# Patient Record
Sex: Female | Born: 1960 | Race: Black or African American | Hispanic: No | Marital: Single | Smoking: Never smoker
Health system: Southern US, Community
[De-identification: ages and names within clinical notes are randomized; demographics above are authoritative.]

## PROBLEM LIST (undated history)

## (undated) DIAGNOSIS — G4730 Sleep apnea, unspecified: Secondary | ICD-10-CM

## (undated) DIAGNOSIS — F319 Bipolar disorder, unspecified: Secondary | ICD-10-CM

## (undated) DIAGNOSIS — G43909 Migraine, unspecified, not intractable, without status migrainosus: Secondary | ICD-10-CM

## (undated) DIAGNOSIS — J449 Chronic obstructive pulmonary disease, unspecified: Secondary | ICD-10-CM

## (undated) DIAGNOSIS — J45909 Unspecified asthma, uncomplicated: Secondary | ICD-10-CM

## (undated) DIAGNOSIS — I517 Cardiomegaly: Secondary | ICD-10-CM

## (undated) DIAGNOSIS — K219 Gastro-esophageal reflux disease without esophagitis: Secondary | ICD-10-CM

## (undated) DIAGNOSIS — I639 Cerebral infarction, unspecified: Secondary | ICD-10-CM

## (undated) HISTORY — PX: ABDOMINAL SURGERY: SHX537

## (undated) HISTORY — PX: ABDOMINAL HYSTERECTOMY: SHX81

---

## 1998-01-09 HISTORY — PX: OTHER SURGICAL HISTORY: SHX169

## 2013-05-14 ENCOUNTER — Emergency Department: Payer: Self-pay | Admitting: Emergency Medicine

## 2013-05-14 LAB — CBC
HCT: 36.3 % (ref 35.0–47.0)
HGB: 12.1 g/dL (ref 12.0–16.0)
MCHC: 33.3 g/dL (ref 32.0–36.0)
MCV: 89 fL (ref 80–100)
Platelet: 200 10*3/uL (ref 150–440)
RBC: 4.09 10*6/uL (ref 3.80–5.20)
WBC: 5.3 10*3/uL (ref 3.6–11.0)

## 2013-05-14 LAB — MAGNESIUM: Magnesium: 1.6 mg/dL — ABNORMAL LOW

## 2013-05-14 LAB — BASIC METABOLIC PANEL
Calcium, Total: 8.5 mg/dL (ref 8.5–10.1)
Chloride: 108 mmol/L — ABNORMAL HIGH (ref 98–107)
Co2: 28 mmol/L (ref 21–32)
Creatinine: 0.67 mg/dL (ref 0.60–1.30)
EGFR (African American): >60
Glucose: 99 mg/dL (ref 65–99)
Potassium: 4.5 mmol/L (ref 3.5–5.1)

## 2013-05-14 LAB — URINALYSIS, COMPLETE
Bilirubin,UR: NEGATIVE
Glucose,UR: NEGATIVE mg/dL (ref 0–75)
Ketone: NEGATIVE
Nitrite: NEGATIVE
RBC,UR: 1 /HPF (ref 0–5)
Squamous Epithelial: 1
WBC UR: 1 /HPF (ref 0–5)

## 2020-09-18 ENCOUNTER — Emergency Department: Payer: Medicaid Other

## 2020-09-18 ENCOUNTER — Emergency Department
Admission: EM | Admit: 2020-09-18 | Discharge: 2020-09-19 | Disposition: A | Discharge status: Home / Self Care | Payer: Medicaid Other | Attending: Emergency Medicine | Admitting: Emergency Medicine

## 2020-09-18 ENCOUNTER — Other Ambulatory Visit: Payer: Self-pay

## 2020-09-18 DIAGNOSIS — J449 Chronic obstructive pulmonary disease, unspecified: Secondary | ICD-10-CM | POA: Diagnosis not present

## 2020-09-18 DIAGNOSIS — Z5321 Procedure and treatment not carried out due to patient leaving prior to being seen by health care provider: Secondary | ICD-10-CM | POA: Insufficient documentation

## 2020-09-18 DIAGNOSIS — J45909 Unspecified asthma, uncomplicated: Secondary | ICD-10-CM | POA: Insufficient documentation

## 2020-09-18 DIAGNOSIS — K219 Gastro-esophageal reflux disease without esophagitis: Secondary | ICD-10-CM | POA: Insufficient documentation

## 2020-09-18 DIAGNOSIS — R1110 Vomiting, unspecified: Secondary | ICD-10-CM | POA: Diagnosis present

## 2020-09-18 LAB — CBC
HCT: 41.3 % (ref 36.0–46.0)
Hemoglobin: 13 g/dL (ref 12.0–15.0)
MCH: 29.6 pg (ref 26.0–34.0)
MCHC: 31.5 g/dL (ref 30.0–36.0)
MCV: 94.1 fL (ref 80.0–100.0)
Platelets: 225 10*3/uL (ref 150–400)
RBC: 4.39 MIL/uL (ref 3.87–5.11)
RDW: 12.5 % (ref 11.5–15.5)
WBC: 6.3 10*3/uL (ref 4.0–10.5)
nRBC: 0 % (ref 0.0–0.2)

## 2020-09-18 LAB — COMPREHENSIVE METABOLIC PANEL
ALT: 7 U/L (ref 0–44)
AST: 14 U/L — ABNORMAL LOW (ref 15–41)
Albumin: 4.3 g/dL (ref 3.5–5.0)
Alkaline Phosphatase: 60 U/L (ref 38–126)
Anion gap: 10 (ref 5–15)
BUN: 11 mg/dL (ref 6–20)
CO2: 31 mmol/L (ref 22–32)
Calcium: 9.1 mg/dL (ref 8.9–10.3)
Chloride: 99 mmol/L (ref 98–111)
Creatinine, Ser: 1 mg/dL (ref 0.44–1.00)
GFR calc Af Amer: >60 mL/min (ref >60)
GFR calc non Af Amer: >60 mL/min (ref >60)
Glucose, Bld: 91 mg/dL (ref 70–99)
Potassium: 3.6 mmol/L (ref 3.5–5.1)
Sodium: 140 mmol/L (ref 135–145)
Total Bilirubin: 0.6 mg/dL (ref 0.3–1.2)
Total Protein: 8.7 g/dL — ABNORMAL HIGH (ref 6.5–8.1)

## 2020-09-18 LAB — LIPASE, BLOOD: Lipase: 21 U/L (ref 11–51)

## 2020-09-18 NOTE — ED Notes (Signed)
Patient with an episode of emesis. Assisted to the bathroom.

## 2020-09-18 NOTE — ED Triage Notes (Addendum)
Patient states that her reflux, COPD and asthmas have been bad for the past 3 days.  Patient reports she is vomiting acid.  Patient with no acute distress noted.

## 2020-09-20 ENCOUNTER — Other Ambulatory Visit: Payer: Self-pay

## 2020-09-20 ENCOUNTER — Encounter: Payer: Self-pay | Admitting: Emergency Medicine

## 2020-09-20 ENCOUNTER — Emergency Department
Admission: EM | Admit: 2020-09-20 | Discharge: 2020-09-20 | Disposition: A | Discharge status: Home / Self Care | Payer: Medicaid Other | Attending: Emergency Medicine | Admitting: Emergency Medicine

## 2020-09-20 DIAGNOSIS — R112 Nausea with vomiting, unspecified: Secondary | ICD-10-CM | POA: Diagnosis present

## 2020-09-20 DIAGNOSIS — Z20822 Contact with and (suspected) exposure to covid-19: Secondary | ICD-10-CM | POA: Diagnosis not present

## 2020-09-20 DIAGNOSIS — R05 Cough: Secondary | ICD-10-CM | POA: Insufficient documentation

## 2020-09-20 DIAGNOSIS — J449 Chronic obstructive pulmonary disease, unspecified: Secondary | ICD-10-CM | POA: Diagnosis not present

## 2020-09-20 DIAGNOSIS — R509 Fever, unspecified: Secondary | ICD-10-CM | POA: Diagnosis not present

## 2020-09-20 HISTORY — DX: Bipolar disorder, unspecified: F31.9

## 2020-09-20 HISTORY — DX: Chronic obstructive pulmonary disease, unspecified: J44.9

## 2020-09-20 HISTORY — DX: Unspecified asthma, uncomplicated: J45.909

## 2020-09-20 HISTORY — DX: Cardiomegaly: I51.7

## 2020-09-20 HISTORY — DX: Gastro-esophageal reflux disease without esophagitis: K21.9

## 2020-09-20 HISTORY — DX: Migraine, unspecified, not intractable, without status migrainosus: G43.909

## 2020-09-20 LAB — COMPREHENSIVE METABOLIC PANEL
ALT: 11 U/L (ref 0–44)
AST: 15 U/L (ref 15–41)
Albumin: 4.1 g/dL (ref 3.5–5.0)
Alkaline Phosphatase: 56 U/L (ref 38–126)
Anion gap: 9 (ref 5–15)
BUN: 9 mg/dL (ref 6–20)
CO2: 30 mmol/L (ref 22–32)
Calcium: 9 mg/dL (ref 8.9–10.3)
Chloride: 99 mmol/L (ref 98–111)
Creatinine, Ser: 1.01 mg/dL — ABNORMAL HIGH (ref 0.44–1.00)
GFR calc Af Amer: >60 mL/min (ref >60)
GFR calc non Af Amer: >60 mL/min (ref >60)
Glucose, Bld: 104 mg/dL — ABNORMAL HIGH (ref 70–99)
Potassium: 3.6 mmol/L (ref 3.5–5.1)
Sodium: 138 mmol/L (ref 135–145)
Total Bilirubin: 0.9 mg/dL (ref 0.3–1.2)
Total Protein: 8.1 g/dL (ref 6.5–8.1)

## 2020-09-20 LAB — CBC
HCT: 38.9 % (ref 36.0–46.0)
Hemoglobin: 12.1 g/dL (ref 12.0–15.0)
MCH: 28.9 pg (ref 26.0–34.0)
MCHC: 31.1 g/dL (ref 30.0–36.0)
MCV: 93.1 fL (ref 80.0–100.0)
Platelets: 217 10*3/uL (ref 150–400)
RBC: 4.18 MIL/uL (ref 3.87–5.11)
RDW: 12.6 % (ref 11.5–15.5)
WBC: 6.1 10*3/uL (ref 4.0–10.5)
nRBC: 0 % (ref 0.0–0.2)

## 2020-09-20 LAB — URINALYSIS, COMPLETE (UACMP) WITH MICROSCOPIC
Bilirubin Urine: NEGATIVE
Glucose, UA: NEGATIVE mg/dL
Ketones, ur: NEGATIVE mg/dL
Leukocytes,Ua: NEGATIVE
Nitrite: NEGATIVE
Protein, ur: NEGATIVE mg/dL
Specific Gravity, Urine: 1.006 (ref 1.005–1.030)
pH: 5 (ref 5.0–8.0)

## 2020-09-20 LAB — SARS CORONAVIRUS 2 BY RT PCR (HOSPITAL ORDER, PERFORMED IN ~~LOC~~ HOSPITAL LAB): SARS Coronavirus 2: NEGATIVE

## 2020-09-20 LAB — LIPASE, BLOOD: Lipase: 20 U/L (ref 11–51)

## 2020-09-20 MED ORDER — ONDANSETRON HCL 4 MG/2ML IJ SOLN
4.0000 mg | Freq: Once | INTRAMUSCULAR | Status: AC
  Administered 2020-09-20: 4 mg via INTRAVENOUS
  Filled 2020-09-20: qty 2

## 2020-09-20 MED ORDER — PROMETHAZINE HCL 25 MG/ML IJ SOLN
25.0000 mg | Freq: Once | INTRAMUSCULAR | Status: AC
  Administered 2020-09-20: 25 mg via INTRAVENOUS
  Filled 2020-09-20: qty 1

## 2020-09-20 MED ORDER — ONDANSETRON HCL 4 MG/2ML IJ SOLN: 4.0000 mg | Freq: Once | INTRAMUSCULAR | Status: DC

## 2020-09-20 MED ORDER — PROMETHAZINE HCL 25 MG PO TABS: 25.0000 mg | ORAL_TABLET | Freq: Four times a day (QID) | ORAL | 0 refills | Status: DC | PRN

## 2020-09-20 MED ORDER — HYDROCOD POLST-CPM POLST ER 10-8 MG/5ML PO SUER
5.0000 mL | Freq: Once | ORAL | Status: AC
  Administered 2020-09-20: 5 mL via ORAL
  Filled 2020-09-20: qty 5

## 2020-09-20 MED ORDER — LIDOCAINE VISCOUS HCL 2 % MT SOLN
15.0000 mL | Freq: Once | OROMUCOSAL | Status: AC
  Administered 2020-09-20: 15 mL via ORAL
  Filled 2020-09-20: qty 15

## 2020-09-20 MED ORDER — METOCLOPRAMIDE HCL 5 MG/ML IJ SOLN
10.0000 mg | Freq: Once | INTRAMUSCULAR | Status: AC
  Administered 2020-09-20: 10 mg via INTRAVENOUS
  Filled 2020-09-20: qty 2

## 2020-09-20 MED ORDER — KETOROLAC TROMETHAMINE 30 MG/ML IJ SOLN
30.0000 mg | Freq: Once | INTRAMUSCULAR | Status: AC
  Administered 2020-09-20: 30 mg via INTRAVENOUS
  Filled 2020-09-20: qty 1

## 2020-09-20 MED ORDER — ALUM & MAG HYDROXIDE-SIMETH 200-200-20 MG/5ML PO SUSP
30.0000 mL | Freq: Once | ORAL | Status: AC
  Administered 2020-09-20: 30 mL via ORAL
  Filled 2020-09-20: qty 30

## 2020-09-20 MED ORDER — SODIUM CHLORIDE 0.9 % IV BOLUS
1000.0000 mL | Freq: Once | INTRAVENOUS | Status: AC
  Administered 2020-09-20: 1000 mL via INTRAVENOUS

## 2020-09-20 MED ORDER — GI COCKTAIL ~~LOC~~: 30.0000 mL | Freq: Two times a day (BID) | ORAL | 0 refills | Status: DC | PRN

## 2020-09-20 NOTE — ED Triage Notes (Signed)
Patient to ER for c/o vomiting. Patient states she has asthma, COPD, and GERD and "they're all ganging up on to where I can't eat without throwing up".

## 2020-09-20 NOTE — ED Notes (Signed)
See triage note  Presents with some n/v  No fever at present

## 2020-09-20 NOTE — ED Provider Notes (Signed)
[facility] Emergency Department Provider Note  Time seen: 3:52 PM  I have reviewed the triage vital signs and the nursing notes.   HISTORY  Chief Complaint Nausea vomiting fever   HPI Tiffany [name] is a 59 y.o. female with a past medical history of asthma, bipolar, COPD, gastric reflux, presents to the emergency department for 4 days of symptoms nausea vomiting occasional cough and low-grade fever.  According to the patient for the most part she has been experiencing low-grade fever however she states 2 days ago it was as high as 102.  Patient came to the emergency department but due to prolonged wait left before being seen.  Patient states he continues to feel some shortness of breath but mostly nausea and vomiting and occasional cough.  Patient states he has not been able to keep down any fluids for the past 24 hours.  Denies any diarrhea denies any abdominal pain.  Patient has been vaccinated against Covid her second vaccine was 05/19/2020.   Past Medical History:  Diagnosis Date  . Asthma   . Bipolar 1 disorder (HCC)   . COPD (chronic obstructive pulmonary disease) (HCC)   . Enlarged heart   . GERD (gastroesophageal reflux disease)   . Migraines     There are no problems to display for this patient.   Past Surgical History:  Procedure Laterality Date  . ABDOMINAL HYSTERECTOMY    . ABDOMINAL SURGERY     "For acid reflux"  . CESAREAN SECTION      Prior to Admission medications   Not on File    Allergies  Allergen Reactions  . Dairy Aid [Lactase] Other (See Comments)    Exacerbates asthma  . Egg [Eggs Or Egg-Derived Products] Other (See Comments)    Exacerbated asthma    No family history on file.  Social History Social History   Tobacco Use  . Smoking status: Never Smoker  . Smokeless tobacco: Never Used  Substance Use Topics  . Alcohol use: Never  . Drug use: Not on file    Review of Systems Constitutional: Intermittent  fever Cardiovascular: Negative for chest pain. Respiratory: Negative for shortness of breath. Gastrointestinal: Negative for abdominal pain.  Positive for nausea vomiting. Musculoskeletal: Negative for musculoskeletal complaints Neurological: Intermittent moderate headache. All other ROS negative  ____________________________________________   PHYSICAL EXAM:  VITAL SIGNS: ED Triage Vitals  Enc Vitals Group     BP 09/20/20 1357 (!) 147/90     Pulse Rate 09/20/20 1357 80     Resp 09/20/20 1357 20     Temp 09/20/20 1357 98.3 F (36.8 C)     Temp Source 09/20/20 1357 Oral     SpO2 09/20/20 1357 96 %     Weight 09/20/20 1358 (!) 335 lb 1.6 oz (152 kg)     Height 09/20/20 1358 5' 9.5" (1.765 m)     Head Circumference --      Peak Flow --      Pain Score 09/20/20 1358 10     Pain Loc --      Pain Edu? --      Excl. in GC? --    Constitutional: Alert and oriented. Well appearing and in no distress. Eyes: Normal exam ENT      Head: Normocephalic and atraumatic.      Mouth/Throat: Mucous membranes are moist. Cardiovascular: Normal rate, regular rhythm.  Respiratory: Normal respiratory effort without tachypnea nor retractions. Breath sounds are clear  Gastrointestinal: Soft and  nontender. No distention.   Musculoskeletal: Nontender with normal range of motion in all extremities. Neurologic:  Normal speech and language. No gross focal neurologic deficits Skin:  Skin is warm, dry and intact.  Psychiatric: Mood and affect are normal.  ____________________________________________   RADIOLOGY  Chest x-ray reviewed from 2 days ago shows no acute abnormality.  ____________________________________________   INITIAL IMPRESSION / ASSESSMENT AND PLAN / ED COURSE  Pertinent labs & imaging results that were available during my care of the patient were reviewed by me and considered in my medical decision making (see chart for details).   Patient presents to the emergency department  for 4 days of nausea vomiting cough fever as high as 102 at home.  Patient symptoms are very suggestive of viral process such as COVID-19 patient has been vaccinated by last vaccine was 05/19/2020, and with the high incidence of delta variant we will proceed with Covid testing.  We will place an IV, IV hydrate treat with Zofran and Toradol.  Patient agreeable to plan of care.  Covid test is negative.  Labs are largely reassuring.  Patient continued to have intermittent nausea which she thought was mostly due to reflux.  Patient received a GI cocktail and Phenergan and felt much better.  Has been able to eat crackers and drink fluids.  We will discharge with Phenergan and GI cocktails for the patient.  Discussed return precautions.  Tiffany [name] was evaluated in Emergency Department on 09/20/2020 for the symptoms described in the history of present illness. She was evaluated in the context of the global COVID-19 pandemic, which necessitated consideration that the patient might be at risk for infection with the SARS-CoV-2 virus that causes COVID-19. Institutional protocols and algorithms that pertain to the evaluation of patients at risk for COVID-19 are in a state of rapid change based on information released by regulatory bodies including the CDC and federal and state organizations. These policies and algorithms were followed during the patient's care in the ED.  ____________________________________________   FINAL CLINICAL IMPRESSION(S) / ED DIAGNOSES  Nausea vomiting   Minna Antis, MD 09/20/20 2001

## 2020-11-11 ENCOUNTER — Emergency Department: Payer: Medicaid Other

## 2020-11-11 ENCOUNTER — Emergency Department
Admission: EM | Admit: 2020-11-11 | Discharge: 2020-11-11 | Disposition: A | Discharge status: Home / Self Care | Payer: Medicaid Other | Attending: Emergency Medicine | Admitting: Emergency Medicine

## 2020-11-11 ENCOUNTER — Other Ambulatory Visit: Payer: Self-pay

## 2020-11-11 DIAGNOSIS — J45909 Unspecified asthma, uncomplicated: Secondary | ICD-10-CM | POA: Diagnosis not present

## 2020-11-11 DIAGNOSIS — J449 Chronic obstructive pulmonary disease, unspecified: Secondary | ICD-10-CM | POA: Diagnosis not present

## 2020-11-11 DIAGNOSIS — R0789 Other chest pain: Secondary | ICD-10-CM | POA: Diagnosis not present

## 2020-11-11 DIAGNOSIS — M542 Cervicalgia: Secondary | ICD-10-CM | POA: Insufficient documentation

## 2020-11-11 DIAGNOSIS — W19XXXA Unspecified fall, initial encounter: Secondary | ICD-10-CM

## 2020-11-11 DIAGNOSIS — M79602 Pain in left arm: Secondary | ICD-10-CM | POA: Insufficient documentation

## 2020-11-11 DIAGNOSIS — W010XXA Fall on same level from slipping, tripping and stumbling without subsequent striking against object, initial encounter: Secondary | ICD-10-CM | POA: Insufficient documentation

## 2020-11-11 MED ORDER — METHOCARBAMOL 500 MG PO TABS: 500.0000 mg | ORAL_TABLET | Freq: Three times a day (TID) | ORAL | 0 refills | Status: AC | PRN

## 2020-11-11 MED ORDER — HYDROCODONE-ACETAMINOPHEN 5-325 MG PO TABS
1.0000 | ORAL_TABLET | Freq: Once | ORAL | Status: AC
  Administered 2020-11-11: 1 via ORAL
  Filled 2020-11-11: qty 1

## 2020-11-11 MED ORDER — ONDANSETRON 4 MG PO TBDP
4.0000 mg | ORAL_TABLET | Freq: Once | ORAL | Status: AC
  Administered 2020-11-11: 4 mg via ORAL
  Filled 2020-11-11: qty 1

## 2020-11-11 NOTE — ED Notes (Signed)
Pt wheeled back to room. Alert. Calm. A&Ox4. Denies hitting head when she fell. Pt was able to stand turn and sit on stretcher. C/o neck, left arm, lower back, and bilateral hip pain. Pt reports able to move all extremities appropriately. Denies numbness/tingling anywhere. No bleeding noted. Pt reports significant other was with her when she fell. Reports fell while going over an uneven surface with her walker. Visitor at bedside.

## 2020-11-11 NOTE — ED Provider Notes (Signed)
Emergency Department Provider Note  ____________________________________________  Time seen: Approximately 4:41 PM  I have reviewed the triage vital signs and the nursing notes.   HISTORY  Chief Complaint Fall   Historian Patient     HPI Tiffany [name] is a 59 y.o. female presents to the emergency department after patient had a mechanical fall.  She states that she was attempting to stand and lost her balance.  She states that she sits most of the time and has not attempted to ambulate since injury occurred.  She denies hitting her head.  She is complaining of neck discomfort that radiates down her left arm and some left-sided anterior chest wall discomfort.  She denies weakness in the arms and legs.  No chest pain, chest tightness or abdominal pain.   Past Medical History:  Diagnosis Date   Asthma    Bipolar 1 disorder (HCC)    COPD (chronic obstructive pulmonary disease) (HCC)    Enlarged heart    GERD (gastroesophageal reflux disease)    Migraines      Immunizations up to date:  Yes.     Past Medical History:  Diagnosis Date   Asthma    Bipolar 1 disorder (HCC)    COPD (chronic obstructive pulmonary disease) (HCC)    Enlarged heart    GERD (gastroesophageal reflux disease)    Migraines     There are no problems to display for this patient.   Past Surgical History:  Procedure Laterality Date   ABDOMINAL HYSTERECTOMY     ABDOMINAL SURGERY     "For acid reflux"   CESAREAN SECTION      Prior to Admission medications   Medication Sig Start Date End Date Taking? Authorizing Provider  Alum & Mag Hydroxide-Simeth (GI COCKTAIL) SUSP suspension Take 30 mLs by mouth 2 (two) times daily as needed for indigestion (abd pain). Shake well. Each dose to containe 59mL maalox and 63mL viscous lidocaine 09/20/20   Minna Antis, MD  methocarbamol (ROBAXIN) 500 MG tablet Take 1 tablet (500 mg total) by mouth every 8 (eight) hours as needed for up to  5 days for muscle spasms. 11/11/20 11/16/20  Orvil Feil, PA-C  promethazine (PHENERGAN) 25 MG tablet Take 1 tablet (25 mg total) by mouth every 6 (six) hours as needed for nausea or vomiting. 09/20/20   Minna Antis, MD    Allergies Dairy aid [lactase] and Egg [eggs or egg-derived products]  History reviewed. No pertinent family history.  Social History Social History   Tobacco Use   Smoking status: Never Smoker   Smokeless tobacco: Never Used  Substance Use Topics   Alcohol use: Never   Drug use: Never     Review of Systems  Constitutional: No fever/chills Eyes:  No discharge ENT: No upper respiratory complaints. Respiratory: no cough. No SOB/ use of accessory muscles to breath Gastrointestinal:   No nausea, no vomiting.  No diarrhea.  No constipation. Musculoskeletal: Patient has left arm pain and neck pain.  Skin: Negative for rash, abrasions, lacerations, ecchymosis.    ____________________________________________   PHYSICAL EXAM:  VITAL SIGNS: ED Triage Vitals  Enc Vitals Group     BP 11/11/20 1516 (!) 151/88     Pulse Rate 11/11/20 1516 83     Resp 11/11/20 1516 18     Temp 11/11/20 1516 98.7 F (37.1 C)     Temp Source 11/11/20 1516 Oral     SpO2 11/11/20 1516 93 %     Weight 11/11/20  1517 (!) 350 lb (158.8 kg)     Height 11/11/20 1517 5' 9.5" (1.765 m)     Head Circumference --      Peak Flow --      Pain Score 11/11/20 1516 10     Pain Loc --      Pain Edu? --      Excl. in GC? --      Constitutional: Alert and oriented. Well appearing and in no acute distress. Eyes: Conjunctivae are normal. PERRL. EOMI. Head: Atraumatic. ENT:      Nose: No congestion/rhinnorhea.      Mouth/Throat: Mucous membranes are moist.  Neck: No stridor.  Full range of motion.  No midline C-spine tenderness to palpation. Cardiovascular: Normal rate, regular rhythm. Normal S1 and S2.  Good peripheral circulation. Respiratory: Normal respiratory effort  without tachypnea or retractions. Lungs CTAB. Good air entry to the bases with no decreased or absent breath sounds Gastrointestinal: Bowel sounds x 4 quadrants. Soft and nontender to palpation. No guarding or rigidity. No distention. Musculoskeletal: Full range of motion to all extremities. No obvious deformities noted Neurologic:  Normal for age. No gross focal neurologic deficits are appreciated.  Skin:  Skin is warm, dry and intact. No rash noted. Psychiatric: Mood and affect are normal for age. Speech and behavior are normal.   ____________________________________________   LABS (all labs ordered are listed, but only abnormal results are displayed)  Labs Reviewed - No data to display ____________________________________________  EKG   ____________________________________________  RADIOLOGY Geraldo Pitter, personally viewed and evaluated these images (plain radiographs) as part of my medical decision making, as well as reviewing the written report by the radiologist.    DG Chest 1 View  Result Date: 11/11/2020 CLINICAL DATA:  60 year old female status post fall on concrete at 1450 hours. EXAM: CHEST  1 VIEW COMPARISON:  Chest radiographs 09/18/2020. FINDINGS: Portable AP semi upright view at 1643 hours. Kyphotic positioning. Lung volumes and mediastinal contours appear stable. Allowing for portable technique the lungs are clear. No pneumothorax. No acute osseous abnormality identified. IMPRESSION: No acute cardiopulmonary abnormality or acute traumatic injury identified. Electronically Signed   By: Odessa Fleming M.D.   On: 11/11/2020 16:57   CT Cervical Spine Wo Contrast  Result Date: 11/11/2020 CLINICAL DATA:  Neck trauma, left neck pain, fall today. EXAM: CT CERVICAL SPINE WITHOUT CONTRAST TECHNIQUE: Multidetector CT imaging of the cervical spine was performed without intravenous contrast. Multiplanar CT image reconstructions were also generated. COMPARISON:  None. FINDINGS: Body  habitus reduces diagnostic sensitivity and specificity. In particular the signal to noise ratio from C6 through T2 is markedly reduced causing indistinctness of cortical margins in reducing sensitivity for bony abnormalities. Alignment: Loss of the normal cervical lordosis, which can be associated with muscle spasm. Skull base and vertebrae: No cervical spine fracture or acute subluxation is identified. Mild cervical spondylosis. Soft tissues and spinal canal: Unremarkable Disc levels:  No bony impingement identified. Upper chest: Unremarkable Other: Suspected chronic sphenoid sinusitis along with chronic right maxillary sinusitis. IMPRESSION: 1. No cervical spine fracture or acute subluxation is identified. 2. Loss of the normal cervical lordosis, which can be associated with muscle spasm. 3. Mild cervical spondylosis. 4. Chronic sphenoid and right maxillary sinusitis. 5.  Body habitus reduces diagnostic sensitivity and specificity. Electronically Signed   By: Gaylyn Rong M.D.   On: 11/11/2020 18:28    ____________________________________________    PROCEDURES  Procedure(s) performed:     Procedures  Medications  HYDROcodone-acetaminophen (NORCO/VICODIN) 5-325 MG per tablet 1 tablet (1 tablet Oral Given 11/11/20 1645)  ondansetron (ZOFRAN-ODT) disintegrating tablet 4 mg (4 mg Oral Given 11/11/20 1645)     ____________________________________________   INITIAL IMPRESSION / ASSESSMENT AND PLAN / ED COURSE  Pertinent labs & imaging results that were available during my care of the patient were reviewed by me and considered in my medical decision making (see chart for details).      Assessment and plan:  Fall 59 year old female presents to the emergency department with neck pain and left anterior chest pain after patient had a mechanical fall.  Patient was hypertensive at triage but vital signs were otherwise reassuring.  Anterior chest discomfort was reproducible to  palpation.  Differential diagnosis included pneumothorax, chest wall contusion, C-spine fracture...  CT of the cervical spine was reviewed and there was no evidence of acute bony abnormalities.  No evidence of pneumothorax or rib fracture on chest x-ray.  Patient was given one-time dose of Norco in the emergency department and discharged with a short course of Robaxin.  Return precautions were given to return with new or worsening symptoms.   ____________________________________________  FINAL CLINICAL IMPRESSION(S) / ED DIAGNOSES  Final diagnoses:  Fall, initial encounter      NEW MEDICATIONS STARTED DURING THIS VISIT:  ED Discharge Orders         Ordered    methocarbamol (ROBAXIN) 500 MG tablet  Every 8 hours PRN        11/11/20 1900              This chart was dictated using voice recognition software/Dragon. Despite best efforts to proofread, errors can occur which can change the meaning. Any change was purely unintentional.     Orvil Feil, PA-C 11/11/20 1910    Gilles Chiquito, MD 11/11/20 2330

## 2020-11-11 NOTE — ED Triage Notes (Signed)
Pt here via POV from home after mechanical fall on concrete around 2:50pm this afternoon. Pt states she tripped and fell onto her back, also c/o L arm pain and swelling in her fingers. Pt denies hitting head, denies LOC. Pt is not on blood thinners.

## 2020-11-11 NOTE — ED Notes (Signed)
First Nurse Note: Pt to ED for fall. Pt is in NAD.

## 2021-02-15 ENCOUNTER — Other Ambulatory Visit (HOSPITAL_COMMUNITY): Payer: Self-pay | Admitting: Radiology

## 2021-02-15 DIAGNOSIS — G4733 Obstructive sleep apnea (adult) (pediatric): Secondary | ICD-10-CM

## 2021-03-13 ENCOUNTER — Encounter (INDEPENDENT_AMBULATORY_CARE_PROVIDER_SITE_OTHER): Payer: Self-pay | Admitting: *Deleted

## 2021-03-17 ENCOUNTER — Other Ambulatory Visit (HOSPITAL_COMMUNITY)
Admission: RE | Admit: 2021-03-17 | Discharge: 2021-03-17 | Disposition: A | Discharge status: Home / Self Care | Payer: Medicaid Other | Source: Ambulatory Visit | Attending: Family Medicine | Admitting: Family Medicine

## 2021-03-17 ENCOUNTER — Other Ambulatory Visit (HOSPITAL_COMMUNITY): Payer: Medicaid Other

## 2021-03-20 ENCOUNTER — Telehealth (HOSPITAL_COMMUNITY): Payer: Self-pay | Admitting: *Deleted

## 2021-03-20 NOTE — Telephone Encounter (Signed)
Called patient and left a message with her voicemail. I asked patient to call me if she could come in today for her Covid screening. I will reschedule her an appointment. I also explained to her she would need to call her Dr.'s office and cancel her procedure. Waiting for patient's call.

## 2021-03-21 ENCOUNTER — Ambulatory Visit (HOSPITAL_COMMUNITY): Admission: RE | Admit: 2021-03-21 | Payer: Medicaid Other | Source: Ambulatory Visit

## 2021-03-21 ENCOUNTER — Ambulatory Visit: Payer: Medicaid Other | Admitting: Orthopedic Surgery

## 2021-04-04 ENCOUNTER — Other Ambulatory Visit (HOSPITAL_BASED_OUTPATIENT_CLINIC_OR_DEPARTMENT_OTHER): Payer: Self-pay

## 2021-04-04 DIAGNOSIS — G4733 Obstructive sleep apnea (adult) (pediatric): Secondary | ICD-10-CM

## 2021-04-21 ENCOUNTER — Other Ambulatory Visit (HOSPITAL_COMMUNITY): Payer: Medicaid Other

## 2021-06-08 ENCOUNTER — Encounter (INDEPENDENT_AMBULATORY_CARE_PROVIDER_SITE_OTHER): Payer: Self-pay | Admitting: *Deleted

## 2021-06-08 ENCOUNTER — Ambulatory Visit (INDEPENDENT_AMBULATORY_CARE_PROVIDER_SITE_OTHER): Payer: Medicaid Other | Admitting: Gastroenterology

## 2021-06-08 ENCOUNTER — Encounter (INDEPENDENT_AMBULATORY_CARE_PROVIDER_SITE_OTHER): Payer: Self-pay | Admitting: Gastroenterology

## 2021-06-08 ENCOUNTER — Telehealth (INDEPENDENT_AMBULATORY_CARE_PROVIDER_SITE_OTHER): Payer: Self-pay

## 2021-06-08 NOTE — Telephone Encounter (Signed)
Patient no showed for her appointment on 06/08/2021 with Dr. Levon Hedger at [facility].Referred by Dr. Shellia Carwin.

## 2021-07-10 ENCOUNTER — Emergency Department
Admission: EM | Admit: 2021-07-10 | Discharge: 2021-07-10 | Disposition: A | Discharge status: Home / Self Care | Payer: Medicaid Other | Attending: Emergency Medicine | Admitting: Emergency Medicine

## 2021-07-10 ENCOUNTER — Other Ambulatory Visit: Payer: Self-pay

## 2021-07-10 ENCOUNTER — Emergency Department: Payer: Medicaid Other

## 2021-07-10 DIAGNOSIS — R059 Cough, unspecified: Secondary | ICD-10-CM

## 2021-07-10 DIAGNOSIS — R112 Nausea with vomiting, unspecified: Secondary | ICD-10-CM | POA: Diagnosis not present

## 2021-07-10 DIAGNOSIS — J4521 Mild intermittent asthma with (acute) exacerbation: Secondary | ICD-10-CM | POA: Diagnosis not present

## 2021-07-10 DIAGNOSIS — J449 Chronic obstructive pulmonary disease, unspecified: Secondary | ICD-10-CM | POA: Diagnosis not present

## 2021-07-10 DIAGNOSIS — J4531 Mild persistent asthma with (acute) exacerbation: Secondary | ICD-10-CM

## 2021-07-10 DIAGNOSIS — J209 Acute bronchitis, unspecified: Secondary | ICD-10-CM | POA: Diagnosis not present

## 2021-07-10 LAB — URINALYSIS, COMPLETE (UACMP) WITH MICROSCOPIC
Bacteria, UA: NONE SEEN
Bilirubin Urine: NEGATIVE
Glucose, UA: NEGATIVE mg/dL
Ketones, ur: NEGATIVE mg/dL
Leukocytes,Ua: NEGATIVE
Nitrite: NEGATIVE
Protein, ur: 30 mg/dL — AB
Specific Gravity, Urine: 1.025 (ref 1.005–1.030)
pH: 5 (ref 5.0–8.0)

## 2021-07-10 LAB — LIPASE, BLOOD: Lipase: 22 U/L (ref 11–51)

## 2021-07-10 LAB — COMPREHENSIVE METABOLIC PANEL
ALT: 8 U/L (ref 0–44)
AST: 13 U/L — ABNORMAL LOW (ref 15–41)
Albumin: 4 g/dL (ref 3.5–5.0)
Alkaline Phosphatase: 56 U/L (ref 38–126)
Anion gap: 7 (ref 5–15)
BUN: 10 mg/dL (ref 6–20)
CO2: 30 mmol/L (ref 22–32)
Calcium: 9.2 mg/dL (ref 8.9–10.3)
Chloride: 100 mmol/L (ref 98–111)
Creatinine, Ser: 0.85 mg/dL (ref 0.44–1.00)
GFR, Estimated: >60 mL/min (ref >60)
Glucose, Bld: 86 mg/dL (ref 70–99)
Potassium: 3.6 mmol/L (ref 3.5–5.1)
Sodium: 137 mmol/L (ref 135–145)
Total Bilirubin: 0.7 mg/dL (ref 0.3–1.2)
Total Protein: 7.9 g/dL (ref 6.5–8.1)

## 2021-07-10 LAB — CBC
HCT: 39 % (ref 36.0–46.0)
Hemoglobin: 11.8 g/dL — ABNORMAL LOW (ref 12.0–15.0)
MCH: 29.3 pg (ref 26.0–34.0)
MCHC: 30.3 g/dL (ref 30.0–36.0)
MCV: 96.8 fL (ref 80.0–100.0)
Platelets: 208 10*3/uL (ref 150–400)
RBC: 4.03 MIL/uL (ref 3.87–5.11)
RDW: 12.9 % (ref 11.5–15.5)
WBC: 6.5 10*3/uL (ref 4.0–10.5)
nRBC: 0 % (ref 0.0–0.2)

## 2021-07-10 MED ORDER — ONDANSETRON 4 MG PO TBDP
8.0000 mg | ORAL_TABLET | Freq: Once | ORAL | Status: AC
Start: 2021-07-10 — End: 2021-07-10
  Administered 2021-07-10: 8 mg via ORAL
  Filled 2021-07-10: qty 2

## 2021-07-10 MED ORDER — METHYLPREDNISOLONE 4 MG PO TBPK: ORAL_TABLET | ORAL | 0 refills | Status: DC

## 2021-07-10 MED ORDER — DOXYCYCLINE HYCLATE 50 MG PO CAPS: 100.0000 mg | ORAL_CAPSULE | Freq: Two times a day (BID) | ORAL | 0 refills | Status: AC

## 2021-07-10 MED ORDER — BENZONATATE 100 MG PO CAPS
200.0000 mg | ORAL_CAPSULE | Freq: Once | ORAL | Status: AC
  Administered 2021-07-10: 200 mg via ORAL
  Filled 2021-07-10: qty 2

## 2021-07-10 MED ORDER — BENZONATATE 100 MG PO CAPS: 100.0000 mg | ORAL_CAPSULE | Freq: Three times a day (TID) | ORAL | 0 refills | Status: AC | PRN

## 2021-07-10 MED ORDER — PREDNISONE 20 MG PO TABS
60.0000 mg | ORAL_TABLET | Freq: Once | ORAL | Status: AC
  Administered 2021-07-10: 60 mg via ORAL
  Filled 2021-07-10: qty 3

## 2021-07-10 NOTE — ED Triage Notes (Addendum)
Pt states for the past 3 days she has been coughing a lot, congested, coughing up mucous and is vomiting, states that she hasn't been sleeping due to coughing and vomiting, pt also reports headache

## 2021-07-10 NOTE — ED Provider Notes (Signed)
[facility] Emergency Department Provider Note   ____________________________________________   Event Date/Time   First MD Initiated Contact with Patient 07/10/21 2212     (approximate)  I have reviewed the triage vital signs and the nursing notes.   HISTORY  Chief Complaint Cough and Emesis    HPI Tiffany [name] is a 60 y.o. female who presents for cough, nausea, and vomiting that have been present over the last 3 days, worsening since onset, and has no exacerbating or relieving factors.  Patient states that she many times has coughing spells that and and emesis but states that she is unable to keep down any p.o. intake.  Patient also endorses associated posterior oropharyngeal pain due to vomiting and is worsened with any vomiting.  Patient describes a burning, 10/10, nonradiating throat pain.          Past Medical History:  Diagnosis Date   Asthma    Bipolar 1 disorder (HCC)    COPD (chronic obstructive pulmonary disease) (HCC)    Enlarged heart    GERD (gastroesophageal reflux disease)    Migraines     There are no problems to display for this patient.   Past Surgical History:  Procedure Laterality Date   ABDOMINAL HYSTERECTOMY     ABDOMINAL SURGERY     "For acid reflux"   CESAREAN SECTION      Prior to Admission medications   Medication Sig Start Date End Date Taking? Authorizing Provider  benzonatate (TESSALON PERLES) 100 MG capsule Take 1 capsule (100 mg total) by mouth 3 (three) times daily as needed for up to 10 days for cough. 07/10/21 07/20/21 Yes Alicia Ackert, Clent Jacks, MD  doxycycline (VIBRAMYCIN) 50 MG capsule Take 2 capsules (100 mg total) by mouth 2 (two) times daily for 5 days. 07/10/21 07/15/21 Yes Merwyn Katos, MD  methylPREDNISolone (MEDROL DOSEPAK) 4 MG TBPK tablet Follow instructions in package 07/10/21  Yes Janee Ureste, Clent Jacks, MD  Alum & Mag Hydroxide-Simeth (GI COCKTAIL) SUSP suspension Take 30 mLs by mouth 2 (two) times  daily as needed for indigestion (abd pain). Shake well. Each dose to containe 39mL maalox and 4mL viscous lidocaine 09/20/20   Minna Antis, MD  promethazine (PHENERGAN) 25 MG tablet Take 1 tablet (25 mg total) by mouth every 6 (six) hours as needed for nausea or vomiting. 09/20/20   Minna Antis, MD    Allergies Dairy aid [lactase] and Egg [eggs or egg-derived products]  No family history on file.  Social History Social History   Tobacco Use   Smoking status: Never   Smokeless tobacco: Never  Substance Use Topics   Alcohol use: Never   Drug use: Never    Review of Systems Constitutional: No fever/chills Eyes: No visual changes. ENT: Endorses sore throat and cough Cardiovascular: Denies chest pain. Respiratory: Endorses shortness of breath. Gastrointestinal: No abdominal pain.  Endorses nausea and vomiting.  No diarrhea. Genitourinary: Negative for dysuria. Musculoskeletal: Negative for acute arthralgias Skin: Negative for rash. Neurological: Negative for headaches, weakness/numbness/paresthesias in any extremity Psychiatric: Negative for suicidal ideation/homicidal ideation   ____________________________________________   PHYSICAL EXAM:  VITAL SIGNS: ED Triage Vitals  Enc Vitals Group     BP 07/10/21 1610 (!) 157/88     Pulse Rate 07/10/21 1610 76     Resp 07/10/21 1610 18     Temp 07/10/21 1610 98.4 F (36.9 C)     Temp Source 07/10/21 1610 Oral     SpO2 07/10/21 1610 97 %  Weight 07/10/21 1611 (!) 332 lb (150.6 kg)     Height 07/10/21 1611 5\' 9"  (1.753 m)     Head Circumference --      Peak Flow --      Pain Score 07/10/21 1611 10     Pain Loc --      Pain Edu? --      Excl. in GC? --    Constitutional: Alert and oriented. Well appearing morbidly obese middle-aged African-American female in no acute distress. Eyes: Conjunctivae are normal. PERRL. Head: Atraumatic. Nose: No congestion/rhinnorhea. Mouth/Throat: Mucous membranes are  moist. Neck: No stridor Cardiovascular: Grossly normal heart sounds.  Good peripheral circulation. Respiratory: Expiratory wheezes centrally.  Normal respiratory effort.  No retractions. Gastrointestinal: Soft and nontender. No distention. Musculoskeletal: No obvious deformities Neurologic:  Normal speech and language. No gross focal neurologic deficits are appreciated. Skin:  Skin is warm and dry. No rash noted. Psychiatric: Mood and affect are normal. Speech and behavior are normal.  ____________________________________________   LABS (all labs ordered are listed, but only abnormal results are displayed)  Labs Reviewed  CBC - Abnormal; Notable for the following components:      Result Value   Hemoglobin 11.8 (*)    All other components within normal limits  URINALYSIS, COMPLETE (UACMP) WITH MICROSCOPIC - Abnormal; Notable for the following components:   Color, Urine YELLOW (*)    APPearance CLOUDY (*)    Hgb urine dipstick MODERATE (*)    Protein, ur 30 (*)    All other components within normal limits  COMPREHENSIVE METABOLIC PANEL - Abnormal; Notable for the following components:   AST 13 (*)    All other components within normal limits  LIPASE, BLOOD    RADIOLOGY  ED MD interpretation: 2 view chest x-ray shows no evidence of acute abnormalities including no pneumonia, pneumothorax, or widened mediastinum  Official radiology report(s): DG Chest 2 View  Result Date: 07/10/2021 CLINICAL DATA:  Cough EXAM: CHEST - 2 VIEW COMPARISON:  November 11, 2020 FINDINGS: The heart size and mediastinal contours are within normal limits. Stable scarring versus atelectasis in the right upper lobe. No focal consolidation. No visible pleural effusion or pneumothorax. The visualized skeletal structures are unremarkable. IMPRESSION: No acute cardiopulmonary disease. Electronically Signed   By: November 13, 2020 MD   On: 07/10/2021 20:26     ____________________________________________   PROCEDURES  Procedure(s) performed (including Critical Care):  .1-3 Lead EKG Interpretation  Date/Time: 07/10/2021 10:52 PM Performed by: 09/10/2021, MD Authorized by: Merwyn Katos, MD     Interpretation: normal     ECG rate:  73   ECG rate assessment: normal     Rhythm: sinus rhythm     Ectopy: none     Conduction: normal     ____________________________________________   INITIAL IMPRESSION / ASSESSMENT AND PLAN / ED COURSE  As part of my medical decision making, I reviewed the following data within the electronic medical record, if available:  Nursing notes reviewed and incorporated, Labs reviewed, EKG interpreted, Old chart reviewed, Radiograph reviewed and Notes from prior ED visits reviewed and incorporated      The patient appears to be suffering from a moderate exacerbation of COPD.  Based on the history, exam, CXR/EKG, and further workup I dont suspect any other emergent cause of this presentation, such as pneumonia, acute coronary syndrome, congestive heart failure, pulmonary embolism, or pneumothorax.  ED Interventions: steroids, antibiotics, reassess  2245 Reassessment: After treatment, the patients shortness of  breath is resolved, and their lung exam has returned to baseline. They are comfortable and want to go home.  Rx: Steroids, Antibiotics, Albuterol Disposition: Discharge home with SRP. PCP follow up recommended in next 48hours.      ____________________________________________   FINAL CLINICAL IMPRESSION(S) / ED DIAGNOSES  Final diagnoses:  Cough  Non-intractable vomiting with nausea, unspecified vomiting type  Acute bronchitis, unspecified organism  Mild intermittent asthma with exacerbation     ED Discharge Orders          Ordered    benzonatate (TESSALON PERLES) 100 MG capsule  3 times daily PRN        07/10/21 2252    methylPREDNISolone (MEDROL DOSEPAK) 4 MG TBPK tablet         07/10/21 2252    doxycycline (VIBRAMYCIN) 50 MG capsule  2 times daily        07/10/21 2252             Note:  This document was prepared using Dragon voice recognition software and may include unintentional dictation errors.    Merwyn Katos, MD 07/10/21 2252

## 2021-07-10 NOTE — ED Notes (Signed)
PO challenge, 30 cc of water q 15 min x 3, stop and notify RN with any nausea or vomiting

## 2021-07-22 ENCOUNTER — Encounter: Payer: Self-pay | Admitting: Emergency Medicine

## 2021-07-22 ENCOUNTER — Other Ambulatory Visit: Payer: Self-pay

## 2021-07-22 DIAGNOSIS — J45909 Unspecified asthma, uncomplicated: Secondary | ICD-10-CM | POA: Insufficient documentation

## 2021-07-22 DIAGNOSIS — Z20822 Contact with and (suspected) exposure to covid-19: Secondary | ICD-10-CM | POA: Diagnosis not present

## 2021-07-22 DIAGNOSIS — K219 Gastro-esophageal reflux disease without esophagitis: Secondary | ICD-10-CM | POA: Diagnosis not present

## 2021-07-22 DIAGNOSIS — K449 Diaphragmatic hernia without obstruction or gangrene: Secondary | ICD-10-CM | POA: Insufficient documentation

## 2021-07-22 DIAGNOSIS — R112 Nausea with vomiting, unspecified: Secondary | ICD-10-CM | POA: Insufficient documentation

## 2021-07-22 DIAGNOSIS — J449 Chronic obstructive pulmonary disease, unspecified: Secondary | ICD-10-CM | POA: Diagnosis not present

## 2021-07-22 DIAGNOSIS — Z5321 Procedure and treatment not carried out due to patient leaving prior to being seen by health care provider: Secondary | ICD-10-CM | POA: Insufficient documentation

## 2021-07-22 LAB — CBC
HCT: 39.5 % (ref 36.0–46.0)
Hemoglobin: 12.7 g/dL (ref 12.0–15.0)
MCH: 29.7 pg (ref 26.0–34.0)
MCHC: 32.2 g/dL (ref 30.0–36.0)
MCV: 92.5 fL (ref 80.0–100.0)
Platelets: 195 10*3/uL (ref 150–400)
RBC: 4.27 MIL/uL (ref 3.87–5.11)
RDW: 13.5 % (ref 11.5–15.5)
WBC: 7.2 10*3/uL (ref 4.0–10.5)
nRBC: 0 % (ref 0.0–0.2)

## 2021-07-22 LAB — COMPREHENSIVE METABOLIC PANEL
ALT: 10 U/L (ref 0–44)
AST: 11 U/L — ABNORMAL LOW (ref 15–41)
Albumin: 3.7 g/dL (ref 3.5–5.0)
Alkaline Phosphatase: 49 U/L (ref 38–126)
Anion gap: 12 (ref 5–15)
BUN: 12 mg/dL (ref 6–20)
CO2: 31 mmol/L (ref 22–32)
Calcium: 9 mg/dL (ref 8.9–10.3)
Chloride: 95 mmol/L — ABNORMAL LOW (ref 98–111)
Creatinine, Ser: 0.92 mg/dL (ref 0.44–1.00)
GFR, Estimated: >60 mL/min (ref >60)
Glucose, Bld: 110 mg/dL — ABNORMAL HIGH (ref 70–99)
Potassium: 2.8 mmol/L — ABNORMAL LOW (ref 3.5–5.1)
Sodium: 138 mmol/L (ref 135–145)
Total Bilirubin: 1.1 mg/dL (ref 0.3–1.2)
Total Protein: 7.2 g/dL (ref 6.5–8.1)

## 2021-07-22 LAB — LIPASE, BLOOD: Lipase: 24 U/L (ref 11–51)

## 2021-07-22 MED ORDER — POTASSIUM CHLORIDE CRYS ER 20 MEQ PO TBCR
40.0000 meq | EXTENDED_RELEASE_TABLET | Freq: Once | ORAL | Status: AC
  Administered 2021-07-22: 40 meq via ORAL
  Filled 2021-07-22: qty 2

## 2021-07-22 MED ORDER — ONDANSETRON 4 MG PO TBDP: 4.0000 mg | ORAL_TABLET | Freq: Once | ORAL | Status: AC

## 2021-07-22 MED ORDER — ONDANSETRON 4 MG PO TBDP
ORAL_TABLET | ORAL | Status: AC
  Administered 2021-07-22: 4 mg via ORAL
  Filled 2021-07-22: qty 1

## 2021-07-22 NOTE — ED Notes (Signed)
Went around looking for this Pt (asking Pt their name) and couldn't locate them.

## 2021-07-22 NOTE — ED Triage Notes (Signed)
Pt reports that she was seen her on 7/11 for the "same thing, and you diagnosised me with asthma, I went to [facility] and they diagnoised me with the same." She reports this is not how her asthma is. She is having N/V since the 11th and not getting any better she want some relief. Reports that the only thing that half way stays down his ice cold water.

## 2021-07-23 ENCOUNTER — Emergency Department: Payer: Medicaid Other

## 2021-07-23 ENCOUNTER — Emergency Department
Admission: EM | Admit: 2021-07-23 | Discharge: 2021-07-23 | Disposition: A | Discharge status: Home / Self Care | Payer: Medicaid Other | Attending: Emergency Medicine | Admitting: Emergency Medicine

## 2021-07-23 ENCOUNTER — Emergency Department
Admission: EM | Admit: 2021-07-23 | Discharge: 2021-07-23 | Disposition: A | Discharge status: Home / Self Care | Payer: Medicaid Other | Source: Home / Self Care | Attending: Emergency Medicine | Admitting: Emergency Medicine

## 2021-07-23 ENCOUNTER — Other Ambulatory Visit: Payer: Self-pay

## 2021-07-23 DIAGNOSIS — J45909 Unspecified asthma, uncomplicated: Secondary | ICD-10-CM | POA: Insufficient documentation

## 2021-07-23 DIAGNOSIS — Z20822 Contact with and (suspected) exposure to covid-19: Secondary | ICD-10-CM | POA: Insufficient documentation

## 2021-07-23 DIAGNOSIS — J449 Chronic obstructive pulmonary disease, unspecified: Secondary | ICD-10-CM | POA: Insufficient documentation

## 2021-07-23 DIAGNOSIS — K219 Gastro-esophageal reflux disease without esophagitis: Secondary | ICD-10-CM | POA: Insufficient documentation

## 2021-07-23 DIAGNOSIS — K449 Diaphragmatic hernia without obstruction or gangrene: Secondary | ICD-10-CM | POA: Insufficient documentation

## 2021-07-23 LAB — CBC
HCT: 42.2 % (ref 36.0–46.0)
Hemoglobin: 13.3 g/dL (ref 12.0–15.0)
MCH: 29 pg (ref 26.0–34.0)
MCHC: 31.5 g/dL (ref 30.0–36.0)
MCV: 91.9 fL (ref 80.0–100.0)
Platelets: 210 10*3/uL (ref 150–400)
RBC: 4.59 MIL/uL (ref 3.87–5.11)
RDW: 13.6 % (ref 11.5–15.5)
WBC: 8.6 10*3/uL (ref 4.0–10.5)
nRBC: 0 % (ref 0.0–0.2)

## 2021-07-23 LAB — RESP PANEL BY RT-PCR (FLU A&B, COVID) ARPGX2
Influenza A by PCR: NEGATIVE
Influenza B by PCR: NEGATIVE
SARS Coronavirus 2 by RT PCR: NEGATIVE

## 2021-07-23 LAB — COMPREHENSIVE METABOLIC PANEL
ALT: 11 U/L (ref 0–44)
AST: 13 U/L — ABNORMAL LOW (ref 15–41)
Albumin: 4.2 g/dL (ref 3.5–5.0)
Alkaline Phosphatase: 52 U/L (ref 38–126)
Anion gap: 13 (ref 5–15)
BUN: 12 mg/dL (ref 6–20)
CO2: 30 mmol/L (ref 22–32)
Calcium: 9.1 mg/dL (ref 8.9–10.3)
Chloride: 95 mmol/L — ABNORMAL LOW (ref 98–111)
Creatinine, Ser: 0.93 mg/dL (ref 0.44–1.00)
GFR, Estimated: >60 mL/min (ref >60)
Glucose, Bld: 110 mg/dL — ABNORMAL HIGH (ref 70–99)
Potassium: 3.2 mmol/L — ABNORMAL LOW (ref 3.5–5.1)
Sodium: 138 mmol/L (ref 135–145)
Total Bilirubin: 1.1 mg/dL (ref 0.3–1.2)
Total Protein: 8.2 g/dL — ABNORMAL HIGH (ref 6.5–8.1)

## 2021-07-23 LAB — LIPASE, BLOOD: Lipase: 23 U/L (ref 11–51)

## 2021-07-23 MED ORDER — IOHEXOL 300 MG/ML  SOLN
100.0000 mL | Freq: Once | INTRAMUSCULAR | Status: AC | PRN
  Administered 2021-07-23: 100 mL via INTRAVENOUS

## 2021-07-23 MED ORDER — PROCHLORPERAZINE MALEATE 10 MG PO TABS: 10.0000 mg | ORAL_TABLET | Freq: Four times a day (QID) | ORAL | 0 refills | Status: DC | PRN

## 2021-07-23 MED ORDER — PROCHLORPERAZINE EDISYLATE 10 MG/2ML IJ SOLN
10.0000 mg | Freq: Once | INTRAMUSCULAR | Status: AC
  Administered 2021-07-23: 10 mg via INTRAVENOUS
  Filled 2021-07-23: qty 2

## 2021-07-23 NOTE — ED Triage Notes (Addendum)
Pt states she has been seen multiple times for "severe acid"- pt states she cannot eat or drink anything because immediately after she vomiting acid- pt states when she was here on 7/11 she was diagnosed with asthma but that was not her problem- pt states she has been taking her prescription medications for GERD

## 2021-07-23 NOTE — ED Notes (Signed)
Patient reports pharmacy was incorrect, correct pharmacy put in, and [place], [place] notified of new prescription need.

## 2021-07-23 NOTE — ED Provider Notes (Signed)
[facility] Emergency Department Provider Note ____________________________________________   Event Date/Time   First MD Initiated Contact with Patient 07/23/21 1505     (approximate)  I have reviewed the triage vital signs and the nursing notes.   HISTORY  Chief Complaint Emesis  HPI Tiffany [name] is a 60 y.o. female with history of GERD presents to the emergency department for treatment and evaluation of epigastric pain and vomiting. Symptoms have been present since July 07, 2021. No relief with prescribed medications.     Past Medical History:  Diagnosis Date   Asthma    Bipolar 1 disorder (HCC)    COPD (chronic obstructive pulmonary disease) (HCC)    Enlarged heart    GERD (gastroesophageal reflux disease)    Migraines     There are no problems to display for this patient.   Past Surgical History:  Procedure Laterality Date   ABDOMINAL HYSTERECTOMY     ABDOMINAL SURGERY     "For acid reflux"   CESAREAN SECTION      Prior to Admission medications   Medication Sig Start Date End Date Taking? Authorizing Provider  prochlorperazine (COMPAZINE) 10 MG tablet Take 1 tablet (10 mg total) by mouth every 6 (six) hours as needed for nausea or vomiting. 07/23/21  Yes Elizjah Noblet B, FNP  Alum & Mag Hydroxide-Simeth (GI COCKTAIL) SUSP suspension Take 30 mLs by mouth 2 (two) times daily as needed for indigestion (abd pain). Shake well. Each dose to containe 37mL maalox and 25mL viscous lidocaine 09/20/20   Minna Antis, MD  methylPREDNISolone (MEDROL DOSEPAK) 4 MG TBPK tablet Follow instructions in package 07/10/21   Merwyn Katos, MD    Allergies Dairy aid [lactase] and Egg [eggs or egg-derived products]  No family history on file.  Social History Social History   Tobacco Use   Smoking status: Never   Smokeless tobacco: Never  Substance Use Topics   Alcohol use: Never   Drug use: Never    Review of Systems  Constitutional: No  fever/chills Eyes: No visual changes. ENT: No sore throat. Cardiovascular: Denies chest pain. Respiratory: Denies shortness of breath. Gastrointestinal: Positive for abdominal pain. Positive for nausea and vomiting.  No diarrhea.  No constipation. Genitourinary: Negative for dysuria. Musculoskeletal: Negative for back pain. Skin: Negative for rash. Neurological: Negative for headaches, focal weakness or numbness. ____________________________________________   PHYSICAL EXAM:  VITAL SIGNS: ED Triage Vitals  Enc Vitals Group     BP 07/23/21 1111 (!) 141/89     Pulse Rate 07/23/21 1111 88     Resp 07/23/21 1111 20     Temp 07/23/21 1111 98.4 F (36.9 C)     Temp Source 07/23/21 1111 Oral     SpO2 07/23/21 1111 93 %     Weight 07/23/21 1112 (!) 340 lb (154.2 kg)     Height 07/23/21 1112 5\' 9"  (1.753 m)     Head Circumference --      Peak Flow --      Pain Score 07/23/21 1112 6     Pain Loc --      Pain Edu? --      Excl. in GC? --     Constitutional: Alert and oriented. Well appearing and in no acute distress. Eyes: Conjunctivae are normal.  Head: Atraumatic. Nose: No congestion/rhinnorhea. Mouth/Throat: Mucous membranes are moist.  Oropharynx non-erythematous. Neck: No stridor.   Hematological/Lymphatic/Immunilogical: No cervical lymphadenopathy. Cardiovascular: Normal rate, regular rhythm. Grossly normal heart sounds.  Good peripheral circulation. Respiratory: Normal respiratory effort.  No retractions. Lungs CTAB. Gastrointestinal: Soft and nontender. No distention. No abdominal bruits. No CVA tenderness. Genitourinary:  Musculoskeletal: No lower extremity tenderness nor edema.  No joint effusions. Neurologic:  Normal speech and language. No gross focal neurologic deficits are appreciated. No gait instability. Skin:  Skin is warm, dry and intact. No rash noted. Psychiatric: Mood and affect are normal. Speech and behavior are  normal.  ____________________________________________   LABS (all labs ordered are listed, but only abnormal results are displayed)  Labs Reviewed  COMPREHENSIVE METABOLIC PANEL - Abnormal; Notable for the following components:      Result Value   Potassium 3.2 (*)    Chloride 95 (*)    Glucose, Bld 110 (*)    Total Protein 8.2 (*)    AST 13 (*)    All other components within normal limits  RESP PANEL BY RT-PCR (FLU A&B, COVID) ARPGX2  LIPASE, BLOOD  CBC   ____________________________________________  EKG  ED ECG REPORT I, Judy Pollman, FNP-BC personally viewed and interpreted this ECG.   Date: 07/23/2021  EKG Time: 1615  Rate: 71  Rhythm: normal EKG, normal sinus rhythm  Axis: Normal  Intervals:none  ST&T Change: no ST elevation  ____________________________________________  RADIOLOGY  ED MD interpretation:    CT abdomen and pelvis shows sliding hiatal hernia without acute concerns.  I, Kem Boroughs, personally viewed and evaluated these images (plain radiographs) as part of my medical decision making, as well as reviewing the written report by the radiologist.  Official radiology report(s): CT ABDOMEN PELVIS W CONTRAST  Result Date: 07/23/2021 CLINICAL DATA:  Epigastric abdominal pain. EXAM: CT ABDOMEN AND PELVIS WITH CONTRAST TECHNIQUE: Multidetector CT imaging of the abdomen and pelvis was performed using the standard protocol following bolus administration of intravenous contrast. CONTRAST:  OMNIPAQUE IOHEXOL 300 MG/ML  SOLN COMPARISON:  None. FINDINGS: Lower chest: Visualized lung bases are unremarkable. Moderate size sliding-type hiatal hernia is noted. Hepatobiliary: No focal liver abnormality is seen. No gallstones, gallbladder wall thickening, or biliary dilatation. Pancreas: Unremarkable. No pancreatic ductal dilatation or surrounding inflammatory changes. Spleen: Normal in size without focal abnormality. Adrenals/Urinary Tract: Adrenal glands are  unremarkable. Kidneys are normal, without renal calculi, focal lesion, or hydronephrosis. Bladder is unremarkable. Stomach/Bowel: Stomach is within normal limits. Appendix appears normal. No evidence of bowel wall thickening, distention, or inflammatory changes. Vascular/Lymphatic: No significant vascular findings are present. No enlarged abdominal or pelvic lymph nodes. Reproductive: Status post hysterectomy. No adnexal masses. Other: No abdominal wall hernia or abnormality. No abdominopelvic ascites. Musculoskeletal: No acute or significant osseous findings. IMPRESSION: Moderate size sliding-type hiatal hernia. No other significant abnormality seen in the abdomen or pelvis. Electronically Signed   By: Lupita Raider M.D.   On: 07/23/2021 17:27    ____________________________________________   PROCEDURES  Procedure(s) performed (including Critical Care):  Procedures  ____________________________________________   INITIAL IMPRESSION / ASSESSMENT AND PLAN   60 year old female presenting to the emergency department for treatment and evaluation of abdominal discomfort and nausea with vomiting.  Symptoms have been present since early July.  Symptoms worsen if she tries to lay flat.  She denies fever, and diarrhea, or constipation.  DIFFERENTIAL DIAGNOSIS  Differential diagnosis includes, but is not limited to, biliary disease (biliary colic, acute cholecystitis, cholangitis, choledocholithiasis, etc), intrathoracic causes for epigastric abdominal pain including ACS, gastritis, duodenitis, pancreatitis, small bowel or large bowel obstruction, abdominal aortic aneurysm, hernia, and ulcer(s).   ED COURSE  Symptoms  improved after administration of Compazine.  Patient requesting discharge home after was being able to tolerate fluids.  She was encouraged to follow-up with her GI provider.  For symptoms that change or worsen if she is unable to schedule an appointment, she is to return to the  emergency department.    ___________________________________________   FINAL CLINICAL IMPRESSION(S) / ED DIAGNOSES  Final diagnoses:  Sliding hiatal hernia     ED Discharge Orders          Ordered    prochlorperazine (COMPAZINE) 10 MG tablet  Every 6 hours PRN        07/23/21 1831             Kayti Poss Towe was evaluated in Emergency Department on 07/23/2021 for the symptoms described in the history of present illness. She was evaluated in the context of the global COVID-19 pandemic, which necessitated consideration that the patient might be at risk for infection with the SARS-CoV-2 virus that causes COVID-19. Institutional protocols and algorithms that pertain to the evaluation of patients at risk for COVID-19 are in a state of rapid change based on information released by regulatory bodies including the CDC and federal and state organizations. These policies and algorithms were followed during the patient's care in the ED.   Note:  This document was prepared using Dragon voice recognition software and may include unintentional dictation errors.    Chinita Pester, FNP 07/23/21 2043    Phineas Semen, MD 07/23/21 [phone]

## 2021-08-12 ENCOUNTER — Encounter: Payer: Self-pay | Admitting: Emergency Medicine

## 2021-08-12 ENCOUNTER — Emergency Department: Payer: Medicaid Other

## 2021-08-12 ENCOUNTER — Emergency Department
Admission: EM | Admit: 2021-08-12 | Discharge: 2021-08-12 | Disposition: A | Discharge status: Home / Self Care | Payer: Medicaid Other | Attending: Emergency Medicine | Admitting: Emergency Medicine

## 2021-08-12 ENCOUNTER — Other Ambulatory Visit: Payer: Self-pay

## 2021-08-12 DIAGNOSIS — R1084 Generalized abdominal pain: Secondary | ICD-10-CM | POA: Diagnosis not present

## 2021-08-12 DIAGNOSIS — J45909 Unspecified asthma, uncomplicated: Secondary | ICD-10-CM | POA: Insufficient documentation

## 2021-08-12 DIAGNOSIS — J449 Chronic obstructive pulmonary disease, unspecified: Secondary | ICD-10-CM | POA: Diagnosis not present

## 2021-08-12 DIAGNOSIS — K219 Gastro-esophageal reflux disease without esophagitis: Secondary | ICD-10-CM | POA: Diagnosis not present

## 2021-08-12 DIAGNOSIS — E876 Hypokalemia: Secondary | ICD-10-CM | POA: Diagnosis not present

## 2021-08-12 DIAGNOSIS — H1032 Unspecified acute conjunctivitis, left eye: Secondary | ICD-10-CM | POA: Insufficient documentation

## 2021-08-12 DIAGNOSIS — R1013 Epigastric pain: Secondary | ICD-10-CM | POA: Diagnosis not present

## 2021-08-12 LAB — BASIC METABOLIC PANEL
Anion gap: 9 (ref 5–15)
BUN: 9 mg/dL (ref 6–20)
CO2: 29 mmol/L (ref 22–32)
Calcium: 8.6 mg/dL — ABNORMAL LOW (ref 8.9–10.3)
Chloride: 99 mmol/L (ref 98–111)
Creatinine, Ser: 0.78 mg/dL (ref 0.44–1.00)
GFR, Estimated: >60 mL/min (ref >60)
Glucose, Bld: 101 mg/dL — ABNORMAL HIGH (ref 70–99)
Potassium: 2.9 mmol/L — ABNORMAL LOW (ref 3.5–5.1)
Sodium: 137 mmol/L (ref 135–145)

## 2021-08-12 LAB — CBC
HCT: 36.3 % (ref 36.0–46.0)
Hemoglobin: 11.6 g/dL — ABNORMAL LOW (ref 12.0–15.0)
MCH: 29.2 pg (ref 26.0–34.0)
MCHC: 32 g/dL (ref 30.0–36.0)
MCV: 91.4 fL (ref 80.0–100.0)
Platelets: 217 10*3/uL (ref 150–400)
RBC: 3.97 MIL/uL (ref 3.87–5.11)
RDW: 13.4 % (ref 11.5–15.5)
WBC: 5.8 10*3/uL (ref 4.0–10.5)
nRBC: 0 % (ref 0.0–0.2)

## 2021-08-12 LAB — SEDIMENTATION RATE: Sed Rate: 35 mm/hr — ABNORMAL HIGH (ref 0–30)

## 2021-08-12 LAB — HEPATIC FUNCTION PANEL
ALT: 11 U/L (ref 0–44)
AST: 12 U/L — ABNORMAL LOW (ref 15–41)
Albumin: 3.4 g/dL — ABNORMAL LOW (ref 3.5–5.0)
Alkaline Phosphatase: 51 U/L (ref 38–126)
Bilirubin, Direct: 0.1 mg/dL (ref 0.0–0.2)
Indirect Bilirubin: 0.6 mg/dL (ref 0.3–0.9)
Total Bilirubin: 0.7 mg/dL (ref 0.3–1.2)
Total Protein: 6.7 g/dL (ref 6.5–8.1)

## 2021-08-12 LAB — TROPONIN I (HIGH SENSITIVITY): Troponin I (High Sensitivity): 4 ng/L (ref <18)

## 2021-08-12 LAB — LIPASE, BLOOD: Lipase: 22 U/L (ref 11–51)

## 2021-08-12 MED ORDER — IOHEXOL 350 MG/ML SOLN
100.0000 mL | Freq: Once | INTRAVENOUS | Status: AC | PRN
  Administered 2021-08-12: 100 mL via INTRAVENOUS

## 2021-08-12 MED ORDER — METOCLOPRAMIDE HCL 10 MG PO TABS: 10.0000 mg | ORAL_TABLET | Freq: Three times a day (TID) | ORAL | 0 refills | Status: DC

## 2021-08-12 MED ORDER — ERYTHROMYCIN 5 MG/GM OP OINT: 1.0000 "application " | TOPICAL_OINTMENT | Freq: Three times a day (TID) | OPHTHALMIC | 0 refills | Status: AC

## 2021-08-12 MED ORDER — SUCRALFATE 1 G PO TABS: 1.0000 g | ORAL_TABLET | Freq: Four times a day (QID) | ORAL | 0 refills | Status: DC

## 2021-08-12 MED ORDER — ALUM & MAG HYDROXIDE-SIMETH 200-200-20 MG/5ML PO SUSP
30.0000 mL | Freq: Once | ORAL | Status: AC
  Administered 2021-08-12: 30 mL via ORAL
  Filled 2021-08-12: qty 30

## 2021-08-12 MED ORDER — SUCRALFATE 1 G PO TABS
1.0000 g | ORAL_TABLET | Freq: Once | ORAL | Status: AC
  Administered 2021-08-12: 1 g via ORAL
  Filled 2021-08-12: qty 1

## 2021-08-12 NOTE — ED Provider Notes (Addendum)
[facility] Emergency Department Provider Note  ____________________________________________   Event Date/Time   First MD Initiated Contact with Patient 08/12/21 2042     (approximate)  I have reviewed the triage vital signs and the nursing notes.   HISTORY  Chief Complaint Numbness and Back Pain   HPI Tiffany [name] is a 60 y.o. female with a past medical history of asthma, bone disorder, COPD, GERD, migraines and hiatal hernia who presents with roughly 4 days of generalized upper abdominal pain rating around to both sides of her back.  She states she feels tingling radiating into her extremities.  She denies any separate back pain or lower abdominal pain.  She states she has had some nausea and vomiting all nonbloody of the last couple days but this is not particularly abnormal for her she states this is a chronic issue related to her hiatal hernia.  She denies any urinary symptoms, diarrhea or constipation.  She denies any chest pain, cough, shortness of breath, headache, earache, sore throat does state her left eye has been draining a little bit over the past week.  She states has been a little bit itchy and intermittently red.  No right eye symptoms.  No other acute concerns at this time.         Past Medical History:  Diagnosis Date   Asthma    Bipolar 1 disorder (HCC)    COPD (chronic obstructive pulmonary disease) (HCC)    Enlarged heart    GERD (gastroesophageal reflux disease)    Migraines     There are no problems to display for this patient.   Past Surgical History:  Procedure Laterality Date   ABDOMINAL HYSTERECTOMY     ABDOMINAL SURGERY     "For acid reflux"   CESAREAN SECTION      Prior to Admission medications   Medication Sig Start Date End Date Taking? Authorizing Provider  erythromycin ophthalmic ointment Place 1 application into the left eye 3 (three) times daily for 5 days. 08/12/21 08/17/21 Yes Gilles Chiquito, MD   metoCLOPramide (REGLAN) 10 MG tablet Take 1 tablet (10 mg total) by mouth 3 (three) times daily with meals for 5 days. 08/12/21 08/17/21 Yes Gilles Chiquito, MD  sucralfate (CARAFATE) 1 g tablet Take 1 tablet (1 g total) by mouth 4 (four) times daily for 5 days. 08/12/21 08/17/21 Yes Gilles Chiquito, MD  Alum & Mag Hydroxide-Simeth (GI COCKTAIL) SUSP suspension Take 30 mLs by mouth 2 (two) times daily as needed for indigestion (abd pain). Shake well. Each dose to containe 46mL maalox and 80mL viscous lidocaine 09/20/20   Minna Antis, MD  methylPREDNISolone (MEDROL DOSEPAK) 4 MG TBPK tablet Follow instructions in package 07/10/21   Merwyn Katos, MD  prochlorperazine (COMPAZINE) 10 MG tablet Take 1 tablet (10 mg total) by mouth every 6 (six) hours as needed for nausea or vomiting. 07/23/21   Chinita Pester, FNP    Allergies Dairy aid [lactase] and Egg [eggs or egg-derived products]  History reviewed. No pertinent family history.  Social History Social History   Tobacco Use   Smoking status: Never   Smokeless tobacco: Never  Substance Use Topics   Alcohol use: Never   Drug use: Never    Review of Systems  Review of Systems  Constitutional:  Negative for chills and fever.  HENT:  Negative for sore throat.   Eyes:  Positive for discharge and redness (L). Negative for pain.  Respiratory:  Negative for cough and stridor.   Cardiovascular:  Negative for chest pain.  Gastrointestinal:  Negative for vomiting.  Genitourinary:  Negative for dysuria.  Musculoskeletal:  Negative for myalgias.  Skin:  Negative for rash.  Neurological:  Positive for sensory change. Negative for seizures, loss of consciousness and headaches.  Psychiatric/Behavioral:  Negative for suicidal ideas.   All other systems reviewed and are negative.    ____________________________________________   PHYSICAL EXAM:  VITAL SIGNS: ED Triage Vitals  Enc Vitals Group     BP 08/12/21 1949 126/73     Pulse  Rate 08/12/21 1949 88     Resp 08/12/21 1949 (!) 24     Temp 08/12/21 1949 99.1 F (37.3 C)     Temp Source 08/12/21 1949 Oral     SpO2 08/12/21 1949 95 %     Weight 08/12/21 1947 (!) 340 lb (154.2 kg)     Height 08/12/21 1947 5\' 9"  (1.753 m)     Head Circumference --      Peak Flow --      Pain Score 08/12/21 1946 10     Pain Loc --      Pain Edu? --      Excl. in GC? --    Vitals:   08/12/21 2225 08/12/21 2227  BP: 120/78   Pulse: 88   Resp: 16 16  Temp:    SpO2: 95%    Physical Exam Vitals and nursing note reviewed.  Constitutional:      General: She is not in acute distress.    Appearance: She is well-developed. She is obese.  HENT:     Head: Normocephalic and atraumatic.     Right Ear: External ear normal.     Left Ear: External ear normal.     Nose: Nose normal.  Eyes:     Conjunctiva/sclera: Conjunctivae normal.  Cardiovascular:     Rate and Rhythm: Normal rate and regular rhythm.     Heart sounds: No murmur heard. Pulmonary:     Effort: Pulmonary effort is normal. No respiratory distress.     Breath sounds: Normal breath sounds.  Abdominal:     Palpations: Abdomen is soft.     Tenderness: There is abdominal tenderness in the epigastric area and left upper quadrant. There is no right CVA tenderness or left CVA tenderness.  Musculoskeletal:     Cervical back: Neck supple.  Skin:    General: Skin is warm and dry.     Capillary Refill: Capillary refill takes less than 2 seconds.  Neurological:     Mental Status: She is alert and oriented to person, place, and time.  Psychiatric:        Mood and Affect: Mood normal.    Cranial nerves II through XII grossly intact.  Of note patient's left eye drifts to the left and station states this is chronic related to amblyopia.  No pronator drift.  No finger dysmetria.  Symmetric 5/5 strength of all extremities.  Sensation intact to light touch in all extremities.  Unremarkable unassisted gait.  Some scant purulent  drainage noted from the medial aspect of the left eye.  No significant injection, chemosis and is no evidence of proptosis or other surrounding skin changes.  No tenderness over the C/T/L-spine.  No overlying skin changes over the bilateral CVA areas.  ____________________________________________   LABS (all labs ordered are listed, but only abnormal results are displayed)  Labs Reviewed  BASIC METABOLIC PANEL - Abnormal; Notable for  the following components:      Result Value   Potassium 2.9 (*)    Glucose, Bld 101 (*)    Calcium 8.6 (*)    All other components within normal limits  CBC - Abnormal; Notable for the following components:   Hemoglobin 11.6 (*)    All other components within normal limits  SEDIMENTATION RATE - Abnormal; Notable for the following components:   Sed Rate 35 (*)    All other components within normal limits  HEPATIC FUNCTION PANEL - Abnormal; Notable for the following components:   Albumin 3.4 (*)    AST 12 (*)    All other components within normal limits  LIPASE, BLOOD  MAGNESIUM  POC URINE PREG, ED  TROPONIN I (HIGH SENSITIVITY)  TROPONIN I (HIGH SENSITIVITY)   ____________________________________________  EKG  Sinus rhythm with a ventricular rate of 90, normal axis, unremarkable intervals without clearance of acute ischemia or significant arrhythmia.  Isolated nonspecific change in lead III and aVF. ____________________________________________  RADIOLOGY  ED MD interpretation: Chest x-rays no effusion, edema, pneumothorax, focal consolidation or other clear acute intrathoracic process.  CT abdomen pelvis shows some biliary sludge without evidence of stones or cholecystitis.  No evidence of appendicitis, SBO, diverticulitis, kidney stone or other clear acute abdominopelvic process.  Official radiology report(s): DG Chest 2 View  Result Date: 08/12/2021 CLINICAL DATA:  Numbness and tightness in the chest EXAM: CHEST - 2 VIEW COMPARISON:   07/10/2021 FINDINGS: The heart size and mediastinal contours are within normal limits. Both lungs are clear. The visualized skeletal structures are unremarkable. IMPRESSION: No active cardiopulmonary disease. Electronically Signed   By: Alcide CleverMark  Lukens M.D.   On: 08/12/2021 20:08   CT ABDOMEN PELVIS W CONTRAST  Result Date: 08/12/2021 CLINICAL DATA:  Abdominal pain EXAM: CT ABDOMEN AND PELVIS WITH CONTRAST TECHNIQUE: Multidetector CT imaging of the abdomen and pelvis was performed using the standard protocol following bolus administration of intravenous contrast. CONTRAST:  100mL OMNIPAQUE IOHEXOL 350 MG/ML SOLN COMPARISON:  07/23/2021 FINDINGS: Lower chest: Lung bases are clear. Hepatobiliary: Liver is within normal limits. Layering gallbladder sludge (series 2/image 32), without associated inflammatory changes. No intrahepatic or extrahepatic ductal dilatation. Pancreas: Within normal limits. Spleen: Within normal limits. Adrenals/Urinary Tract: Adrenal glands are within normal limits. Kidneys are within normal limits.  No hydronephrosis. Bladder is underdistended but unremarkable. Stomach/Bowel: Stomach is notable for a moderate hiatal hernia. No evidence of bowel obstruction. Prior appendectomy. Dense contrast in the rectosigmoid region. Vascular/Lymphatic: No evidence of abdominal aortic aneurysm. No suspicious abdominopelvic lymphadenopathy. Reproductive: Status post hysterectomy. Left ovary is within normal limits.  No right adnexal mass. Other: No abdominopelvic ascites. Musculoskeletal: Visualized osseous structures are within normal limits. IMPRESSION: No evidence of bowel obstruction.  Prior appendectomy. Layering gallbladder sludge, without associated inflammatory changes. Moderate hiatal hernia. Electronically Signed   By: Charline BillsSriyesh  Krishnan M.D.   On: 08/12/2021 21:49    ____________________________________________   PROCEDURES  Procedure(s) performed (including Critical  Care):  Procedures   ____________________________________________   INITIAL IMPRESSION / ASSESSMENT AND PLAN / ED COURSE      Patient presents with above-stated history exam for assessment of a couple days of nausea and vomiting that she states is waxing and waning and chronic as well as some pressure in her upper abdomen that she states rating her back and associate with some paresthesias in her extremities.  On arrival she is afebrile and hemodynamically stable.  She is completely nonfocal neuro exam does have some mild  tenderness in epigastrium and right upper quadrant.  Of note she also has what appears to be a conjunctivitis in the left eye.  No evidence on exam of periorbital cellulitis and I have low suspicion for preseptal or other deep space infection head or neck.  Will write Rx for Romycin.  With regard to her abdominal discomfort and nausea and vomiting differential includes symptomatic hiatal hernia, cholecystitis, symptomatic cholelithiasis, pancreatitis, diverticulitis, kidney stone and acute gastritis.  ECG and nonelevated troponin are not suggestive of atypical presentation for ACS.   CT abdomen pelvis shows some biliary sludge without evidence of stones or cholecystitis.  No evidence of appendicitis, SBO, diverticulitis, kidney stone or other clear acute abdominopelvic process.  BMP remarkable for K of 2.9.  No other significant electrolyte or metabolic derangements.  CBC shows no leukocytosis.  Hemoglobin is 11.6 compared to 13 2 weeks ago.  It seems it was 11.4-month ago.  Lipase 22 not consistent with acute pancreatitis.  Hepatic function panel has no evidence of cholestasis or hepatitis.  After receiving GI cocktail patient states she felt much better still had some persistent pressure.  I suspect her extremity paresthesias may be related to her hyperkalemia.  Offered p.o. potassium repletion in emergency room patient states he has been taking potassium at home prefers to  take this at home.  Advised her to ensure she is taking this when she gets home.  Low suspicion for CVA.  Overall I suspect her symptoms are related from some symptomatic biliary sludging versus hiatal hernia or gastritis.  However given she states she has been able to tolerate little p.o. today without significant nausea in the emergency room with otherwise reassuring CT and work-up and low suspicion for immediate life-threatening process I think she is stable for discharge with close outpatient PCP and GI follow-up.  She stated the Carafate seem to help a fair amount so we will prescribe a short course of this.  She is requesting refill of her Reglan.  This was also prescribed.  Discharged stable condition.  Strict return precautions advised and discussed     ____________________________________________   FINAL CLINICAL IMPRESSION(S) / ED DIAGNOSES  Final diagnoses:  Acute conjunctivitis of left eye, unspecified acute conjunctivitis type  Epigastric pain  Hypokalemia    Medications  alum & mag hydroxide-simeth (MAALOX/MYLANTA) 200-200-20 MG/5ML suspension 30 mL (30 mLs Oral Given 08/12/21 2106)  sucralfate (CARAFATE) tablet 1 g (1 g Oral Given 08/12/21 2106)  iohexol (OMNIPAQUE) 350 MG/ML injection 100 mL (100 mLs Intravenous Contrast Given 08/12/21 2134)     ED Discharge Orders          Ordered    erythromycin ophthalmic ointment  3 times daily        08/12/21 2222    sucralfate (CARAFATE) 1 g tablet  4 times daily        08/12/21 2222    metoCLOPramide (REGLAN) 10 MG tablet  3 times daily with meals        08/12/21 2222             Note:  This document was prepared using Dragon voice recognition software and may include unintentional dictation errors.    Gilles Chiquito, MD 08/12/21 2259    Gilles Chiquito, MD 08/12/21 2300

## 2021-08-12 NOTE — ED Triage Notes (Signed)
Pt states tightness/numbness to her chest/back, pt states numbness that extends down her back into her thighs and feet. Pt states numbness x 4 days. Pt states initially thought it was her COPD but numbness/tightness increases when she coughs/laughs. Pt states symptoms x 4 days.  Pt states "I feel like I'm being crushed".

## 2021-10-08 IMAGING — CR DG CHEST 2V
1 series · 2 of 2 positions shown · non-contrast
Comparison: None.

CLINICAL DATA: 58-year-old female with acid reflux, vomiting acid.
Asthma flare.

EXAM:
CHEST - 2 VIEW

[Series 1: dg chest 2 view · 0.14mm/px · 2 of 2 slices shown]
[im 1/2]
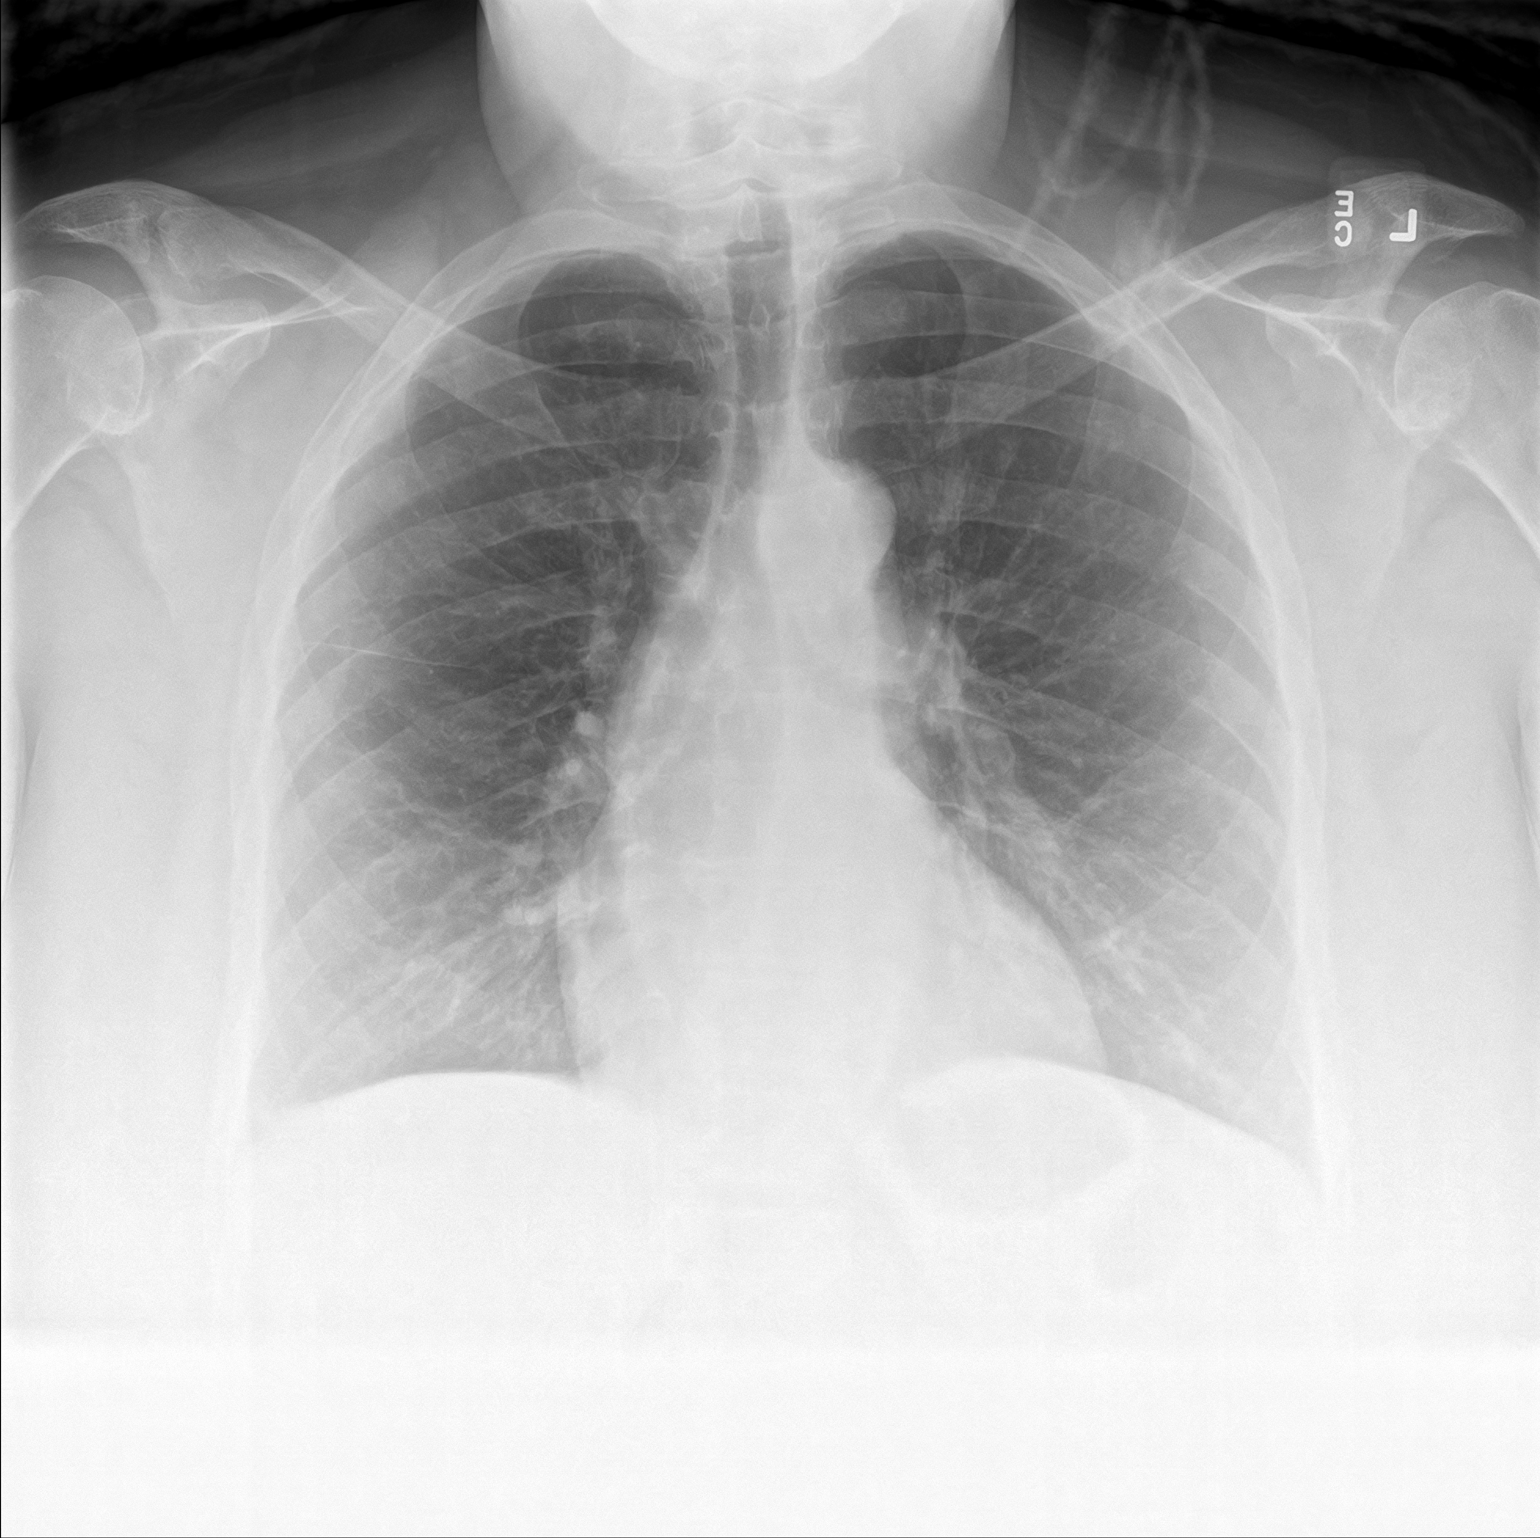
[im 2/2]
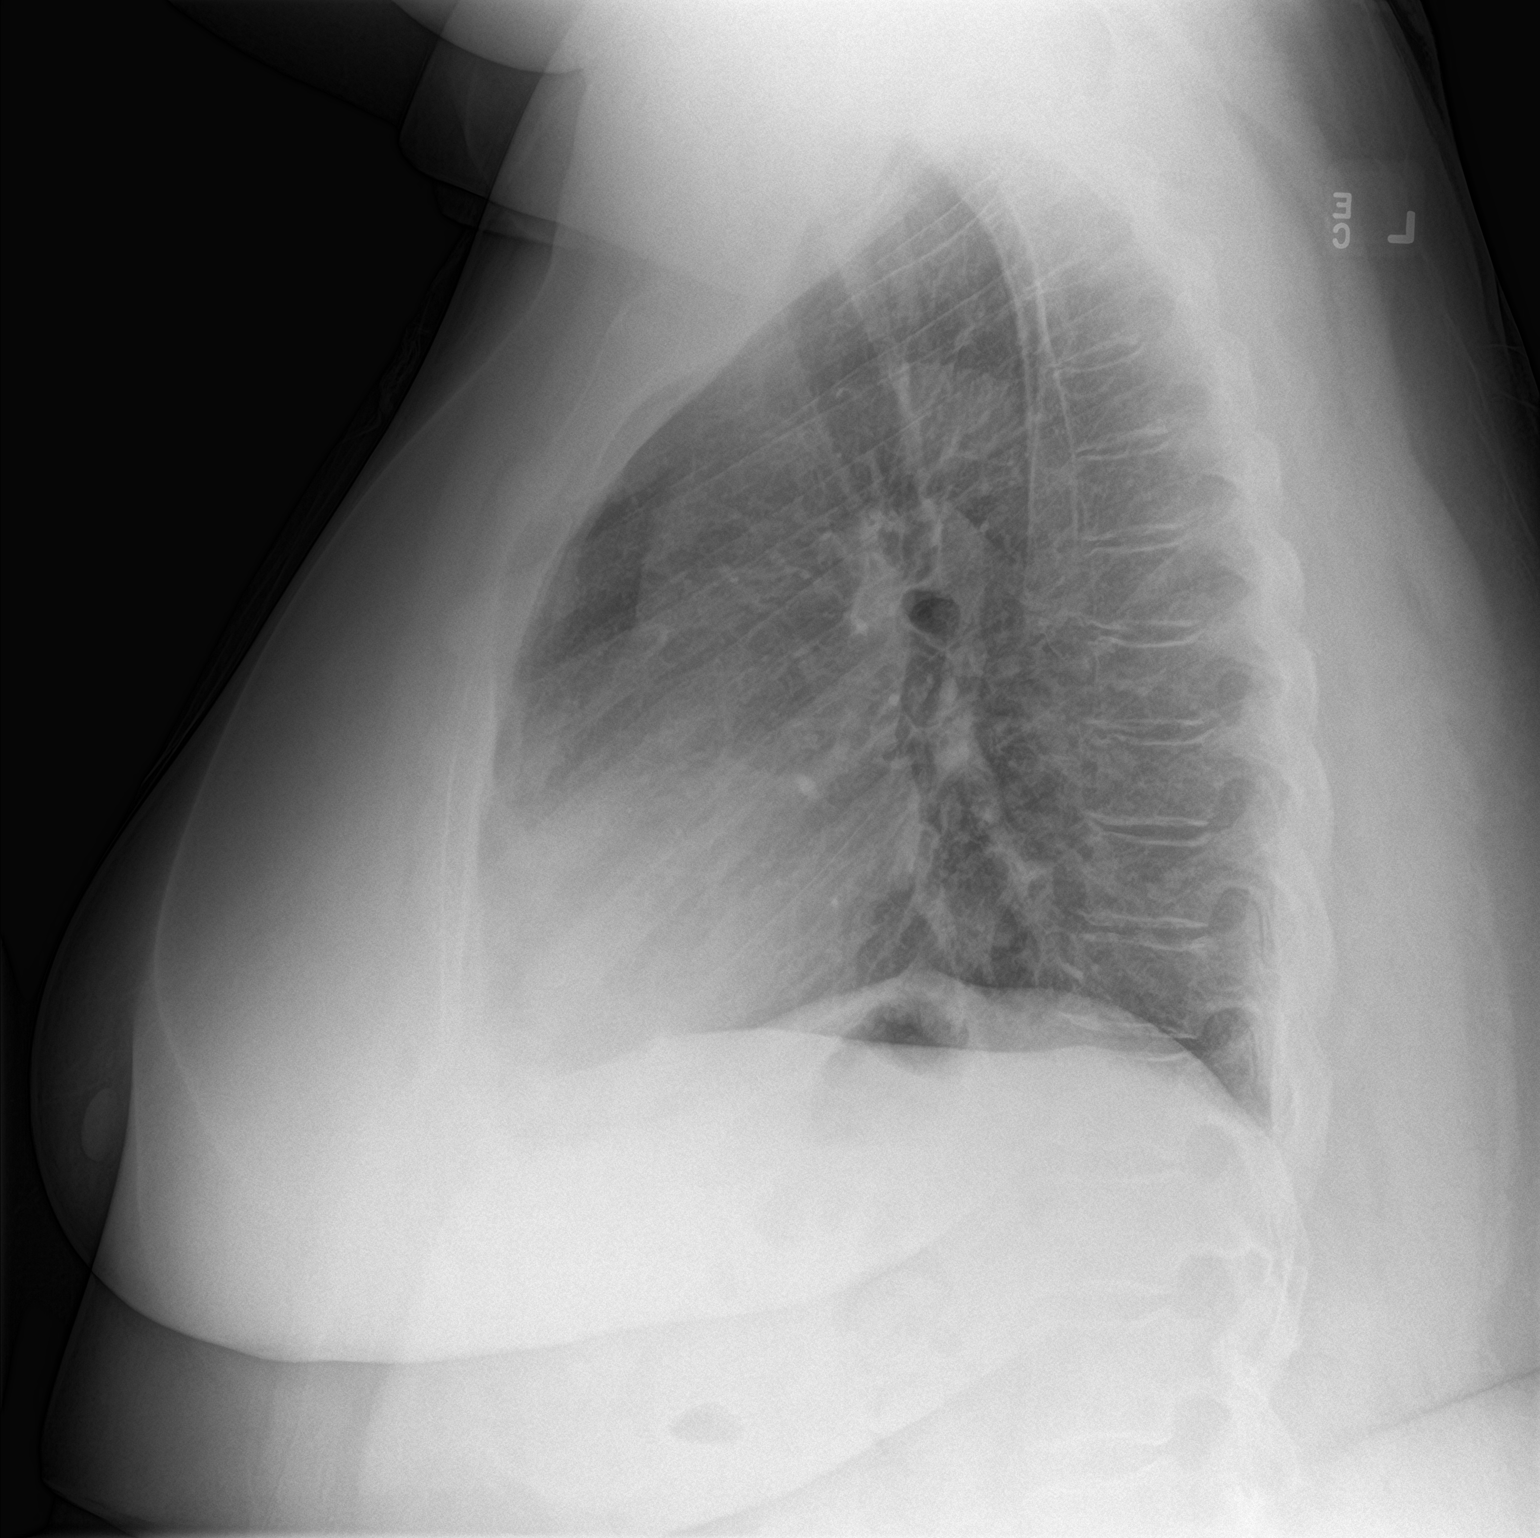

[2 of 2 positions shown; findings below may reference images not displayed]

FINDINGS: Lung volumes and mediastinal contours are within normal limits.
Visualized tracheal air column is within normal limits. No
pneumothorax, pulmonary edema, pleural effusion or confluent
pulmonary opacity.

Negative visible bowel gas pattern. No acute osseous abnormality
identified.
IMPRESSION: No acute cardiopulmonary abnormality.

## 2021-10-12 ENCOUNTER — Other Ambulatory Visit: Payer: Self-pay

## 2021-10-12 ENCOUNTER — Emergency Department
Admission: EM | Admit: 2021-10-12 | Discharge: 2021-10-12 | Disposition: A | Discharge status: Home / Self Care | Payer: Medicaid Other | Attending: Emergency Medicine | Admitting: Emergency Medicine

## 2021-10-12 ENCOUNTER — Encounter: Payer: Self-pay | Admitting: Emergency Medicine

## 2021-10-12 DIAGNOSIS — M79641 Pain in right hand: Secondary | ICD-10-CM | POA: Diagnosis present

## 2021-10-12 DIAGNOSIS — Z79899 Other long term (current) drug therapy: Secondary | ICD-10-CM | POA: Insufficient documentation

## 2021-10-12 DIAGNOSIS — B86 Scabies: Secondary | ICD-10-CM | POA: Insufficient documentation

## 2021-10-12 DIAGNOSIS — J45909 Unspecified asthma, uncomplicated: Secondary | ICD-10-CM | POA: Diagnosis not present

## 2021-10-12 DIAGNOSIS — J449 Chronic obstructive pulmonary disease, unspecified: Secondary | ICD-10-CM | POA: Insufficient documentation

## 2021-10-12 DIAGNOSIS — M79642 Pain in left hand: Secondary | ICD-10-CM | POA: Diagnosis not present

## 2021-10-12 LAB — CBC WITH DIFFERENTIAL/PLATELET
Abs Immature Granulocytes: 0.01 10*3/uL (ref 0.00–0.07)
Basophils Absolute: 0 10*3/uL (ref 0.0–0.1)
Basophils Relative: 1 %
Eosinophils Absolute: 0 10*3/uL (ref 0.0–0.5)
Eosinophils Relative: 1 %
HCT: 34.6 % — ABNORMAL LOW (ref 36.0–46.0)
Hemoglobin: 11.3 g/dL — ABNORMAL LOW (ref 12.0–15.0)
Immature Granulocytes: 0 %
Lymphocytes Relative: 43 %
Lymphs Abs: 1.3 10*3/uL (ref 0.7–4.0)
MCH: 28.6 pg (ref 26.0–34.0)
MCHC: 32.7 g/dL (ref 30.0–36.0)
MCV: 87.6 fL (ref 80.0–100.0)
Monocytes Absolute: 0.3 10*3/uL (ref 0.1–1.0)
Monocytes Relative: 9 %
Neutro Abs: 1.4 10*3/uL — ABNORMAL LOW (ref 1.7–7.7)
Neutrophils Relative %: 46 %
Platelets: 152 10*3/uL (ref 150–400)
RBC: 3.95 MIL/uL (ref 3.87–5.11)
RDW: 13.5 % (ref 11.5–15.5)
WBC: 3 10*3/uL — ABNORMAL LOW (ref 4.0–10.5)
nRBC: 0 % (ref 0.0–0.2)

## 2021-10-12 LAB — BASIC METABOLIC PANEL
Anion gap: 10 (ref 5–15)
BUN: 10 mg/dL (ref 6–20)
CO2: 32 mmol/L (ref 22–32)
Calcium: 8.6 mg/dL — ABNORMAL LOW (ref 8.9–10.3)
Chloride: 93 mmol/L — ABNORMAL LOW (ref 98–111)
Creatinine, Ser: 0.72 mg/dL (ref 0.44–1.00)
GFR, Estimated: >60 mL/min (ref >60)
Glucose, Bld: 96 mg/dL (ref 70–99)
Potassium: 2.8 mmol/L — ABNORMAL LOW (ref 3.5–5.1)
Sodium: 135 mmol/L (ref 135–145)

## 2021-10-12 LAB — TSH: TSH: 3.378 u[IU]/mL (ref 0.350–4.500)

## 2021-10-12 MED ORDER — NAPROXEN 500 MG PO TABS: 500.0000 mg | ORAL_TABLET | Freq: Two times a day (BID) | ORAL | 0 refills | Status: DC

## 2021-10-12 MED ORDER — NAPROXEN 500 MG PO TABS
500.0000 mg | ORAL_TABLET | Freq: Once | ORAL | Status: AC
  Administered 2021-10-12: 500 mg via ORAL
  Filled 2021-10-12: qty 1

## 2021-10-12 MED ORDER — PERMETHRIN 5 % EX CREA: TOPICAL_CREAM | CUTANEOUS | 1 refills | Status: DC

## 2021-10-12 MED ORDER — TRAMADOL HCL 50 MG PO TABS
50.0000 mg | ORAL_TABLET | Freq: Once | ORAL | Status: AC
  Administered 2021-10-12: 50 mg via ORAL
  Filled 2021-10-12: qty 1

## 2021-10-12 NOTE — ED Notes (Signed)
Family member steps out, states, "Her hands are numb and she cant move them."  EDP, [place] in to further assess.

## 2021-10-12 NOTE — ED Provider Notes (Signed)
[facility] Emergency Department Provider Note  ____________________________________________  Time seen: Approximately 12:08 PM  I have reviewed the triage vital signs and the nursing notes.   HISTORY  Chief Complaint Hand Pain  Level 5 Caveat: Portions of the History and Physical including HPI and review of systems are unable to be completely obtained due to patient being a poor historian    HPI Tiffany [name] is a 60 y.o. female with a history of GERD COPD bipolar disorder and obesity who comes ED complaining of bilateral hand pain that is worse with movement and is been going on since this morning.  Gradual onset, constant, no alleviating factors, nonradiating.  No trauma to the hands.  Husband at bedside reports that he was seen in the ED yesterday for extremity pain.  He was given pain medicine and felt much better and was able to leave at that time.  As soon as they left the ED, his wife started having hand pain which she is reported to be increasingly severe.  They are currently staying in a hotel and she has had a rash on her skin for the past 3 days which is intensely pruritic.    Past Medical History:  Diagnosis Date   Asthma    Bipolar 1 disorder (HCC)    COPD (chronic obstructive pulmonary disease) (HCC)    Enlarged heart    GERD (gastroesophageal reflux disease)    Migraines      There are no problems to display for this patient.    Past Surgical History:  Procedure Laterality Date   ABDOMINAL HYSTERECTOMY     ABDOMINAL SURGERY     "For acid reflux"   CESAREAN SECTION       Prior to Admission medications   Medication Sig Start Date End Date Taking? Authorizing Provider  atorvastatin (LIPITOR) 80 MG tablet Take 80 mg by mouth daily.   Yes [provider]  naproxen (NAPROSYN) 500 MG tablet Take 1 tablet (500 mg total) by mouth 2 (two) times daily with a meal. 10/12/21  Yes Sharman Cheek, MD  permethrin (ELIMITE) 5  % cream Thoroughly massage cream (using about half the tube) from head to soles of feet; leave on for 8 to 14 hours before removing (shower or bath); 10/12/21  Yes Sharman Cheek, MD  Alum & Mag Hydroxide-Simeth (GI COCKTAIL) SUSP suspension Take 30 mLs by mouth 2 (two) times daily as needed for indigestion (abd pain). Shake well. Each dose to containe 10mL maalox and 61mL viscous lidocaine 09/20/20   Minna Antis, MD  metoCLOPramide (REGLAN) 10 MG tablet Take 1 tablet (10 mg total) by mouth 3 (three) times daily with meals for 5 days. 08/12/21 08/17/21  Gilles Chiquito, MD  prochlorperazine (COMPAZINE) 10 MG tablet Take 1 tablet (10 mg total) by mouth every 6 (six) hours as needed for nausea or vomiting. 07/23/21   Triplett, Cari B, FNP  sucralfate (CARAFATE) 1 g tablet Take 1 tablet (1 g total) by mouth 4 (four) times daily for 5 days. 08/12/21 08/17/21  Gilles Chiquito, MD     Allergies Dairy aid [lactase] and Egg [eggs or egg-derived products]   No family history on file.  Social History Social History   Tobacco Use   Smoking status: Never   Smokeless tobacco: Never  Substance Use Topics   Alcohol use: Never   Drug use: Never    Review of Systems  Constitutional:   No fever or chills.  ENT:  No sore throat. No rhinorrhea. Cardiovascular:   No chest pain or syncope. Respiratory:   No dyspnea or cough. Gastrointestinal:   Negative for abdominal pain, vomiting and diarrhea.  Musculoskeletal: Positive bilateral hand pain All other systems reviewed and are negative except as documented above in ROS and HPI.  ____________________________________________   PHYSICAL EXAM:  VITAL SIGNS: ED Triage Vitals  Enc Vitals Group     BP 10/12/21 0910 (!) 135/101     Pulse Rate 10/12/21 0910 99     Resp 10/12/21 0910 16     Temp 10/12/21 0910 97.7 F (36.5 C)     Temp Source 10/12/21 0910 Oral     SpO2 10/12/21 0910 92 %     Weight 10/12/21 0915 (!) 339 lb 15.2 oz (154.2 kg)      Height 10/12/21 0915 5\' 9"  (1.753 m)     Head Circumference --      Peak Flow --      Pain Score 10/12/21 0914 7     Pain Loc --      Pain Edu? --      Excl. in GC? --     Vital signs reviewed, nursing assessments reviewed.   Constitutional:   Alert and oriented. Non-toxic appearance. Eyes:   Conjunctivae are normal. EOMI. PERRL. ENT      Head:   Normocephalic and atraumatic.      Nose:   Wearing a mask.      Mouth/Throat:   Wearing a mask.      Neck:   No meningismus. Full ROM. Hematological/Lymphatic/Immunilogical:   No cervical lymphadenopathy. Cardiovascular:   RRR. Symmetric bilateral radial and DP pulses.  No murmurs. Cap refill less than 2 seconds. Respiratory:   Normal respiratory effort without tachypnea/retractions. Breath sounds are clear and equal bilaterally. No wheezes/rales/rhonchi. Gastrointestinal:   Soft and nontender. Non distended. There is no CVA tenderness.  No rebound, rigidity, or guarding. Genitourinary:   deferred Musculoskeletal:   Normal range of motion in all extremities. No joint effusions.  Intact flexors extensors and intrinsic muscles of the hands.  No bony point tenderness, no wounds, no inflammatory changes.  No lower extremity tenderness.  No edema. Neurologic:   Normal speech and language.  Motor grossly intact. No acute focal neurologic deficits are appreciated.  Skin:    Skin is warm, dry with scattered excoriated superficial small wounds on all extremities, appearing in crops in linear distributions, compatible with scabies infestation ____________________________________________    LABS (pertinent positives/negatives) (all labs ordered are listed, but only abnormal results are displayed) Labs Reviewed  CBC WITH DIFFERENTIAL/PLATELET - Abnormal; Notable for the following components:      Result Value   WBC 3.0 (*)    Hemoglobin 11.3 (*)    HCT 34.6 (*)    Neutro Abs 1.4 (*)    All other components within normal limits  BASIC  METABOLIC PANEL - Abnormal; Notable for the following components:   Potassium 2.8 (*)    Chloride 93 (*)    Calcium 8.6 (*)    All other components within normal limits  TSH   ____________________________________________   EKG    ____________________________________________    RADIOLOGY  No results found.  ____________________________________________   PROCEDURES Procedures  ____________________________________________    CLINICAL IMPRESSION / ASSESSMENT AND PLAN / ED COURSE  Medications ordered in the ED: Medications  naproxen (NAPROSYN) tablet 500 mg (500 mg Oral Given 10/12/21 1143)  traMADol (ULTRAM) tablet 50 mg (50 mg Oral  Given 10/12/21 1143)    Pertinent labs & imaging results that were available during my care of the patient were reviewed by me and considered in my medical decision making (see chart for details).  YERALDY SPIKE was evaluated in Emergency Department on 10/12/2021 for the symptoms described in the history of present illness. She was evaluated in the context of the global COVID-19 pandemic, which necessitated consideration that the patient might be at risk for infection with the SARS-CoV-2 virus that causes COVID-19. Institutional protocols and algorithms that pertain to the evaluation of patients at risk for COVID-19 are in a state of rapid change based on information released by regulatory bodies including the CDC and federal and state organizations. These policies and algorithms were followed during the patient's care in the ED.   Patient presents with bilateral hand pain.  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  This may account for her pain.  Also may be "sympathy pain" due to her husband's recent presentation for similar symptoms.  Her exam is unremarkable, is atraumatic she has full muscular and neurovascular  function.  She does have evidence of scabies.  Husband will plan to wash all the linens, we will treat her with Elimite.      ____________________________________________   FINAL CLINICAL IMPRESSION(S) / ED DIAGNOSES    Final diagnoses:  Pain in both hands  Scabies infestation  Morbid obesity [facility])     ED Discharge Orders          Ordered    permethrin (ELIMITE) 5 % cream        10/12/21 1207    naproxen (NAPROSYN) 500 MG tablet  2 times daily with meals        10/12/21 1207            Portions of this note were generated with dragon dictation software. Dictation errors may occur despite best attempts at proofreading.    Sharman Cheek, MD 10/12/21 [phone]

## 2021-10-12 NOTE — ED Notes (Addendum)
See triage note  presents with pain to both hands    describes pain to hands as "frost bite"  no swelling noted  skin w/d cap refill good

## 2021-10-12 NOTE — ED Triage Notes (Signed)
C/O bilateral hand pain x 2-3 days.  Hands warm and dry.  + radial pulse.  States has had similar pain in the past that was related to arthritis.

## 2021-10-20 ENCOUNTER — Other Ambulatory Visit: Payer: Self-pay

## 2021-10-20 ENCOUNTER — Emergency Department: Payer: Medicaid Other

## 2021-10-20 ENCOUNTER — Emergency Department
Admission: EM | Admit: 2021-10-20 | Discharge: 2021-10-20 | Disposition: A | Discharge status: Home / Self Care | Payer: Medicaid Other | Attending: Emergency Medicine | Admitting: Emergency Medicine

## 2021-10-20 DIAGNOSIS — J45909 Unspecified asthma, uncomplicated: Secondary | ICD-10-CM | POA: Insufficient documentation

## 2021-10-20 DIAGNOSIS — M79672 Pain in left foot: Secondary | ICD-10-CM | POA: Diagnosis not present

## 2021-10-20 DIAGNOSIS — Y9301 Activity, walking, marching and hiking: Secondary | ICD-10-CM | POA: Insufficient documentation

## 2021-10-20 DIAGNOSIS — M79641 Pain in right hand: Secondary | ICD-10-CM | POA: Insufficient documentation

## 2021-10-20 DIAGNOSIS — M79642 Pain in left hand: Secondary | ICD-10-CM | POA: Insufficient documentation

## 2021-10-20 DIAGNOSIS — M7918 Myalgia, other site: Secondary | ICD-10-CM

## 2021-10-20 DIAGNOSIS — M79671 Pain in right foot: Secondary | ICD-10-CM | POA: Insufficient documentation

## 2021-10-20 DIAGNOSIS — J449 Chronic obstructive pulmonary disease, unspecified: Secondary | ICD-10-CM | POA: Insufficient documentation

## 2021-10-20 DIAGNOSIS — W19XXXA Unspecified fall, initial encounter: Secondary | ICD-10-CM | POA: Diagnosis not present

## 2021-10-20 DIAGNOSIS — Y92009 Unspecified place in unspecified non-institutional (private) residence as the place of occurrence of the external cause: Secondary | ICD-10-CM | POA: Diagnosis not present

## 2021-10-20 MED ORDER — TRAMADOL HCL 50 MG PO TABS
50.0000 mg | ORAL_TABLET | Freq: Once | ORAL | Status: AC
  Administered 2021-10-20: 50 mg via ORAL
  Filled 2021-10-20: qty 1

## 2021-10-20 MED ORDER — TRAMADOL HCL 50 MG PO TABS: 50.0000 mg | ORAL_TABLET | Freq: Two times a day (BID) | ORAL | 0 refills | Status: DC | PRN

## 2021-10-20 NOTE — Discharge Instructions (Addendum)
No acute findings on x-ray of the bilateral hand and foot.  Read and follow discharge care instructions.  Take medication as needed for pain.

## 2021-10-20 NOTE — ED Provider Notes (Signed)
[facility] Emergency Department Provider Note   ____________________________________________   Event Date/Time   First MD Initiated Contact with Patient 10/20/21 [phone]     (approximate)  I have reviewed the triage vital signs and the nursing notes.   HISTORY  Chief Complaint Fall    HPI Tiffany [name] is a 60 y.o. female patient complain of bilateral hand and feet pain secondary to a fall which occurred yesterday.  Patient requires assistance with walking but states that her legs gave out causing her to fall forward injuring her hand and feet.  Denied LOC or head injury.  Fall was witnessed by husband who was assisting her to the bathroom.  He is scheduled for hiatal hernia surgery in the next month.  Patient has been want to make sure that there is no  abnormalities of the bilateral hand and foot secondary to a fall.         Past Medical History:  Diagnosis Date   Asthma    Bipolar 1 disorder (HCC)    COPD (chronic obstructive pulmonary disease) (HCC)    Enlarged heart    GERD (gastroesophageal reflux disease)    Migraines     There are no problems to display for this patient.   Past Surgical History:  Procedure Laterality Date   ABDOMINAL HYSTERECTOMY     ABDOMINAL SURGERY     "For acid reflux"   CESAREAN SECTION      Prior to Admission medications   Medication Sig Start Date End Date Taking? Authorizing Provider  traMADol (ULTRAM) 50 MG tablet Take 1 tablet (50 mg total) by mouth every 12 (twelve) hours as needed. 10/20/21  Yes Joni Reining, PA-C  Alum & Mag Hydroxide-Simeth (GI COCKTAIL) SUSP suspension Take 30 mLs by mouth 2 (two) times daily as needed for indigestion (abd pain). Shake well. Each dose to containe 71mL maalox and 39mL viscous lidocaine 09/20/20   Minna Antis, MD  atorvastatin (LIPITOR) 80 MG tablet Take 80 mg by mouth daily.    [provider]  metoCLOPramide (REGLAN) 10 MG tablet Take 1 tablet (10  mg total) by mouth 3 (three) times daily with meals for 5 days. 08/12/21 08/17/21  Gilles Chiquito, MD  naproxen (NAPROSYN) 500 MG tablet Take 1 tablet (500 mg total) by mouth 2 (two) times daily with a meal. 10/12/21   Sharman Cheek, MD  permethrin (ELIMITE) 5 % cream Thoroughly massage cream (using about half the tube) from head to soles of feet; leave on for 8 to 14 hours before removing (shower or bath); 10/12/21   Sharman Cheek, MD  prochlorperazine (COMPAZINE) 10 MG tablet Take 1 tablet (10 mg total) by mouth every 6 (six) hours as needed for nausea or vomiting. 07/23/21   Triplett, Cari B, FNP  sucralfate (CARAFATE) 1 g tablet Take 1 tablet (1 g total) by mouth 4 (four) times daily for 5 days. 08/12/21 08/17/21  Gilles Chiquito, MD    Allergies Dairy aid [tilactase] and Egg [eggs or egg-derived products]  No family history on file.  Social History Social History   Tobacco Use   Smoking status: Never   Smokeless tobacco: Never  Substance Use Topics   Alcohol use: Never   Drug use: Never    Review of Systems  Constitutional: No fever/chills.  Patient weighs 142.9 kg and BMI is 46.52. Eyes: No visual changes. ENT: No sore throat. Cardiovascular: Denies chest pain. Respiratory: Denies shortness of breath. Gastrointestinal: No abdominal  pain.  No nausea, no vomiting.  No diarrhea.  No constipation. Genitourinary: Negative for dysuria. Musculoskeletal: Bilateral hand and foot pain. Skin: Negative for rash. Neurological: Negative for headaches, focal weakness or numbness. Psychiatric: Bipolar Allergic/Immunilogical: Dairy and egg products ____________________________________________   PHYSICAL EXAM:  VITAL SIGNS: ED Triage Vitals  Enc Vitals Group     BP 10/20/21 0850 (!) 135/95     Pulse Rate 10/20/21 0850 94     Resp 10/20/21 0850 16     Temp 10/20/21 0850 98.2 F (36.8 C)     Temp Source 10/20/21 0850 Oral     SpO2 10/20/21 0850 97 %     Weight 10/20/21 0852  (!) 315 lb (142.9 kg)     Height 10/20/21 0852 5\' 9"  (1.753 m)     Head Circumference --      Peak Flow --      Pain Score 10/20/21 0850 10     Pain Loc --      Pain Edu? --      Excl. in GC? --     Constitutional: Alert and oriented. Well appearing and in no acute distress. Eyes: Conjunctivae are normal. PERRL. EOMI. Head: Atraumatic. Nose: No congestion/rhinnorhea. Mouth/Throat: Mucous membranes are moist.  Oropharynx non-erythematous. Neck: No stridor.  No cervical spine tenderness to palpation. Cardiovascular: Normal rate, regular rhythm. Grossly normal heart sounds.  Good peripheral circulation. Respiratory: Normal respiratory effort.  No retractions. Lungs CTAB. Gastrointestinal: Distended secondary to body habitus.  Hiatal hernia.  No abdominal bruits. No CVA tenderness. Genitourinary: Deferred Musculoskeletal: No lower extremity tenderness nor edema.  No joint effusions. Neurologic:  Normal speech and language. No gross focal neurologic deficits are appreciated. No gait instability. Skin:  Skin is warm, dry and intact. No rash noted. Psychiatric: Mood and affect are normal. Speech and behavior are normal.  ____________________________________________   LABS (all labs ordered are listed, but only abnormal results are displayed)  Labs Reviewed - No data to display ____________________________________________  EKG   ____________________________________________  RADIOLOGY I, 10/22/21, personally viewed and evaluated these images (plain radiographs) as part of my medical decision making, as well as reviewing the written report by the radiologist.  ED MD interpretation: No acute findings on x-ray of bilateral hand and foot.  Official radiology report(s): DG Hand 2 View Right  Result Date: 10/20/2021 CLINICAL DATA:  Fall EXAM: RIGHT HAND - 2 VIEW COMPARISON:  None. FINDINGS: There is no evidence of fracture or dislocation. There is no evidence of arthropathy or  other focal bone abnormality. Soft tissues are unremarkable. IMPRESSION: Negative. Electronically Signed   By: 10/22/2021 M.D.   On: 10/20/2021 10:22   DG Hand 2 View Left  Result Date: 10/20/2021 CLINICAL DATA:  Fall EXAM: LEFT HAND - 2 VIEW COMPARISON:  None. FINDINGS: There is no evidence of fracture or dislocation. There is no evidence of arthropathy or other focal bone abnormality. Soft tissues are unremarkable. IMPRESSION: Negative. Electronically Signed   By: 10/22/2021 M.D.   On: 10/20/2021 10:23   DG Foot 2 Views Left  Result Date: 10/20/2021 CLINICAL DATA:  Fall EXAM: LEFT FOOT - 2 VIEW COMPARISON:  None. FINDINGS: Negative for fracture or bone lesion. Degenerative change in the first MTP IMPRESSION: Negative for fracture. Electronically Signed   By: 10/22/2021 M.D.   On: 10/20/2021 10:24   DG Foot 2 Views Right  Result Date: 10/20/2021 CLINICAL DATA:  Fall EXAM: RIGHT FOOT - 2 VIEW COMPARISON:  None. FINDINGS: There is no evidence of fracture or dislocation. There is no evidence of arthropathy or other focal bone abnormality. Soft tissues are unremarkable. IMPRESSION: Negative. Electronically Signed   By: Marlan Palau M.D.   On: 10/20/2021 10:24    ____________________________________________   PROCEDURES  Procedure(s) performed (including Critical Care):  Procedures   ____________________________________________   INITIAL IMPRESSION / ASSESSMENT AND PLAN / ED COURSE  As part of my medical decision making, I reviewed the following data within the electronic MEDICAL RECORD NUMBER         Patient presents with bilateral hand and foot pain secondary to fall at home.  Discussed no acute findings on x-rays.  Patient complaining physical exam consistent muscle skeletal pain secondary to fall.  Patient given discharge care instructions and a prescription for tramadol 50 mg twice daily.  Follow-up PCP.      ____________________________________________   FINAL  CLINICAL IMPRESSION(S) / ED DIAGNOSES  Final diagnoses:  Fall in home, initial encounter  Musculoskeletal pain     ED Discharge Orders          Ordered    traMADol (ULTRAM) 50 MG tablet  Every 12 hours PRN        10/20/21 1044             Note:  This document was prepared using Dragon voice recognition software and may include unintentional dictation errors.    Joni Reining, PA-C 10/20/21 1048    Minna Antis, MD 10/20/21 [phone]

## 2021-10-20 NOTE — ED Triage Notes (Addendum)
Pt states that she has a hard time walking already and was being helped to the bathroom yesterday and her legs gave out, states that she landed on her side and slide, pt states that today her hands and feet aren't working as good. Pt denies hitting her head or neck yesterday  Pt states she hurts in Her hands because she tried to catch herself and her feet because she was trying to help them get her out of the floor

## 2021-10-26 ENCOUNTER — Emergency Department: Payer: Medicaid Other

## 2021-10-26 ENCOUNTER — Encounter: Payer: Self-pay | Admitting: Intensive Care

## 2021-10-26 ENCOUNTER — Other Ambulatory Visit: Payer: Self-pay

## 2021-10-26 ENCOUNTER — Emergency Department
Admission: EM | Admit: 2021-10-26 | Discharge: 2021-10-26 | Disposition: A | Discharge status: Home / Self Care | Payer: Medicaid Other | Attending: Emergency Medicine | Admitting: Emergency Medicine

## 2021-10-26 DIAGNOSIS — M25361 Other instability, right knee: Secondary | ICD-10-CM | POA: Diagnosis present

## 2021-10-26 DIAGNOSIS — Z5321 Procedure and treatment not carried out due to patient leaving prior to being seen by health care provider: Secondary | ICD-10-CM | POA: Diagnosis not present

## 2021-10-26 DIAGNOSIS — M25362 Other instability, left knee: Secondary | ICD-10-CM | POA: Diagnosis not present

## 2021-10-26 DIAGNOSIS — Y9289 Other specified places as the place of occurrence of the external cause: Secondary | ICD-10-CM | POA: Insufficient documentation

## 2021-10-26 DIAGNOSIS — R42 Dizziness and giddiness: Secondary | ICD-10-CM | POA: Diagnosis not present

## 2021-10-26 DIAGNOSIS — W19XXXA Unspecified fall, initial encounter: Secondary | ICD-10-CM | POA: Diagnosis not present

## 2021-10-26 LAB — COMPREHENSIVE METABOLIC PANEL
ALT: 18 U/L (ref 0–44)
AST: 22 U/L (ref 15–41)
Albumin: 3.4 g/dL — ABNORMAL LOW (ref 3.5–5.0)
Alkaline Phosphatase: 39 U/L (ref 38–126)
Anion gap: 13 (ref 5–15)
BUN: 11 mg/dL (ref 6–20)
CO2: 33 mmol/L — ABNORMAL HIGH (ref 22–32)
Calcium: 8.8 mg/dL — ABNORMAL LOW (ref 8.9–10.3)
Chloride: 87 mmol/L — ABNORMAL LOW (ref 98–111)
Creatinine, Ser: 0.69 mg/dL (ref 0.44–1.00)
GFR, Estimated: >60 mL/min (ref >60)
Glucose, Bld: 93 mg/dL (ref 70–99)
Potassium: 2.6 mmol/L — CL (ref 3.5–5.1)
Sodium: 133 mmol/L — ABNORMAL LOW (ref 135–145)
Total Bilirubin: 2.3 mg/dL — ABNORMAL HIGH (ref 0.3–1.2)
Total Protein: 7.2 g/dL (ref 6.5–8.1)

## 2021-10-26 LAB — CBC WITH DIFFERENTIAL/PLATELET
Abs Immature Granulocytes: 0.02 10*3/uL (ref 0.00–0.07)
Basophils Absolute: 0 10*3/uL (ref 0.0–0.1)
Basophils Relative: 0 %
Eosinophils Absolute: 0 10*3/uL (ref 0.0–0.5)
Eosinophils Relative: 1 %
HCT: 35.3 % — ABNORMAL LOW (ref 36.0–46.0)
Hemoglobin: 11.9 g/dL — ABNORMAL LOW (ref 12.0–15.0)
Immature Granulocytes: 1 %
Lymphocytes Relative: 41 %
Lymphs Abs: 1.2 10*3/uL (ref 0.7–4.0)
MCH: 28.6 pg (ref 26.0–34.0)
MCHC: 33.7 g/dL (ref 30.0–36.0)
MCV: 84.9 fL (ref 80.0–100.0)
Monocytes Absolute: 0.2 10*3/uL (ref 0.1–1.0)
Monocytes Relative: 7 %
Neutro Abs: 1.5 10*3/uL — ABNORMAL LOW (ref 1.7–7.7)
Neutrophils Relative %: 50 %
Platelets: 123 10*3/uL — ABNORMAL LOW (ref 150–400)
RBC: 4.16 MIL/uL (ref 3.87–5.11)
RDW: 13.3 % (ref 11.5–15.5)
WBC: 3 10*3/uL — ABNORMAL LOW (ref 4.0–10.5)
nRBC: 0 % (ref 0.0–0.2)

## 2021-10-26 NOTE — ED Notes (Signed)
Pt reported she was leaving, encouraged to stay reprorts will go elsewhere.

## 2021-10-26 NOTE — ED Triage Notes (Signed)
Patient arrived by EMS from [facility] for fall. Patient states " my knees buckled while walking out of bathroom and I fell" Denies LOC or hitting head. Denies pain anywhere but knees. Patient A&O x4

## 2021-10-26 NOTE — ED Triage Notes (Signed)
Pt and husband reported they were leaving. Pt strongly encouraged to stay. Pt refusing and states will go elsewhere.

## 2021-10-26 NOTE — ED Triage Notes (Signed)
Pt in via EMS from [facility] with c/o fall. EMS reports pt fell 20 minutes ago and has been falling recently. Pt reports her knees buckle and cause her to fall. HR 90, 93% RA, 112/83

## 2021-10-26 NOTE — ED Provider Notes (Cosign Needed)
Emergency Medicine Provider Triage Evaluation Note  Tiffany [name] , a 60 y.o. female  was evaluated in triage.  Pt complains of dizziness, weakness, frequent falls over the past couple of weeks per significant other. She fell today and also has bilateral knee pain.  Review of Systems  Positive: Weakness, dizziness, bilateral knee pain Negative: Fever  Physical Exam  There were no vitals taken for this visit. Gen:   Awake, no distress   Resp:  Normal effort  MSK:   Moves extremities without difficulty  Other:    Medical Decision Making  Medically screening exam initiated at 2:29 PM.  Appropriate orders placed.  Tiffany [name] was informed that the remainder of the evaluation will be completed by another provider, this initial triage assessment does not replace that evaluation, and the importance of remaining in the ED until their evaluation is complete.   Tiffany Pester, FNP 10/26/21 1437

## 2021-10-29 ENCOUNTER — Encounter: Payer: Self-pay | Admitting: Emergency Medicine

## 2021-10-29 ENCOUNTER — Inpatient Hospital Stay
Admission: EM | Admit: 2021-10-29 | Discharge: 2021-11-08 | DRG: 641 | Disposition: A | Discharge status: Skilled Nursing Facility | Payer: Medicaid Other | Attending: Internal Medicine | Admitting: Internal Medicine

## 2021-10-29 ENCOUNTER — Inpatient Hospital Stay: Payer: Medicaid Other

## 2021-10-29 ENCOUNTER — Other Ambulatory Visit: Payer: Self-pay

## 2021-10-29 ENCOUNTER — Emergency Department: Payer: Medicaid Other

## 2021-10-29 DIAGNOSIS — N390 Urinary tract infection, site not specified: Secondary | ICD-10-CM | POA: Diagnosis not present

## 2021-10-29 DIAGNOSIS — Z20822 Contact with and (suspected) exposure to covid-19: Secondary | ICD-10-CM | POA: Diagnosis present

## 2021-10-29 DIAGNOSIS — E873 Alkalosis: Secondary | ICD-10-CM | POA: Diagnosis present

## 2021-10-29 DIAGNOSIS — Z79899 Other long term (current) drug therapy: Secondary | ICD-10-CM | POA: Diagnosis not present

## 2021-10-29 DIAGNOSIS — R0902 Hypoxemia: Secondary | ICD-10-CM | POA: Diagnosis not present

## 2021-10-29 DIAGNOSIS — K209 Esophagitis, unspecified without bleeding: Secondary | ICD-10-CM

## 2021-10-29 DIAGNOSIS — R112 Nausea with vomiting, unspecified: Secondary | ICD-10-CM | POA: Diagnosis not present

## 2021-10-29 DIAGNOSIS — K219 Gastro-esophageal reflux disease without esophagitis: Secondary | ICD-10-CM | POA: Diagnosis present

## 2021-10-29 DIAGNOSIS — R531 Weakness: Secondary | ICD-10-CM | POA: Diagnosis not present

## 2021-10-29 DIAGNOSIS — D529 Folate deficiency anemia, unspecified: Secondary | ICD-10-CM | POA: Diagnosis present

## 2021-10-29 DIAGNOSIS — J9601 Acute respiratory failure with hypoxia: Secondary | ICD-10-CM | POA: Diagnosis not present

## 2021-10-29 DIAGNOSIS — Z8249 Family history of ischemic heart disease and other diseases of the circulatory system: Secondary | ICD-10-CM

## 2021-10-29 DIAGNOSIS — E871 Hypo-osmolality and hyponatremia: Secondary | ICD-10-CM | POA: Diagnosis not present

## 2021-10-29 DIAGNOSIS — K2100 Gastro-esophageal reflux disease with esophagitis, without bleeding: Secondary | ICD-10-CM | POA: Diagnosis present

## 2021-10-29 DIAGNOSIS — D696 Thrombocytopenia, unspecified: Secondary | ICD-10-CM | POA: Diagnosis present

## 2021-10-29 DIAGNOSIS — E662 Morbid (severe) obesity with alveolar hypoventilation: Secondary | ICD-10-CM | POA: Diagnosis present

## 2021-10-29 DIAGNOSIS — E869 Volume depletion, unspecified: Secondary | ICD-10-CM | POA: Diagnosis present

## 2021-10-29 DIAGNOSIS — Z6841 Body Mass Index (BMI) 40.0 and over, adult: Secondary | ICD-10-CM | POA: Diagnosis not present

## 2021-10-29 DIAGNOSIS — E559 Vitamin D deficiency, unspecified: Secondary | ICD-10-CM | POA: Diagnosis present

## 2021-10-29 DIAGNOSIS — R64 Cachexia: Secondary | ICD-10-CM

## 2021-10-29 DIAGNOSIS — B86 Scabies: Secondary | ICD-10-CM | POA: Diagnosis present

## 2021-10-29 DIAGNOSIS — W109XXA Fall (on) (from) unspecified stairs and steps, initial encounter: Secondary | ICD-10-CM

## 2021-10-29 DIAGNOSIS — J9602 Acute respiratory failure with hypercapnia: Secondary | ICD-10-CM | POA: Diagnosis not present

## 2021-10-29 DIAGNOSIS — R932 Abnormal findings on diagnostic imaging of liver and biliary tract: Secondary | ICD-10-CM | POA: Diagnosis not present

## 2021-10-29 DIAGNOSIS — E876 Hypokalemia: Principal | ICD-10-CM | POA: Diagnosis present

## 2021-10-29 DIAGNOSIS — Z91011 Allergy to milk products: Secondary | ICD-10-CM

## 2021-10-29 DIAGNOSIS — Z91012 Allergy to eggs: Secondary | ICD-10-CM | POA: Diagnosis not present

## 2021-10-29 DIAGNOSIS — G43909 Migraine, unspecified, not intractable, without status migrainosus: Secondary | ICD-10-CM | POA: Diagnosis present

## 2021-10-29 DIAGNOSIS — G9340 Encephalopathy, unspecified: Secondary | ICD-10-CM | POA: Diagnosis not present

## 2021-10-29 DIAGNOSIS — Z7951 Long term (current) use of inhaled steroids: Secondary | ICD-10-CM

## 2021-10-29 DIAGNOSIS — W19XXXA Unspecified fall, initial encounter: Secondary | ICD-10-CM | POA: Diagnosis present

## 2021-10-29 DIAGNOSIS — R296 Repeated falls: Secondary | ICD-10-CM | POA: Diagnosis present

## 2021-10-29 DIAGNOSIS — J449 Chronic obstructive pulmonary disease, unspecified: Secondary | ICD-10-CM | POA: Diagnosis present

## 2021-10-29 DIAGNOSIS — R262 Difficulty in walking, not elsewhere classified: Secondary | ICD-10-CM | POA: Diagnosis not present

## 2021-10-29 DIAGNOSIS — I10 Essential (primary) hypertension: Secondary | ICD-10-CM | POA: Diagnosis not present

## 2021-10-29 LAB — BASIC METABOLIC PANEL
Anion gap: 14 (ref 5–15)
BUN: 11 mg/dL (ref 6–20)
CO2: 34 mmol/L — ABNORMAL HIGH (ref 22–32)
Calcium: 8.6 mg/dL — ABNORMAL LOW (ref 8.9–10.3)
Chloride: 86 mmol/L — ABNORMAL LOW (ref 98–111)
Creatinine, Ser: 0.8 mg/dL (ref 0.44–1.00)
GFR, Estimated: >60 mL/min (ref >60)
Glucose, Bld: 97 mg/dL (ref 70–99)
Potassium: 2.4 mmol/L — CL (ref 3.5–5.1)
Sodium: 134 mmol/L — ABNORMAL LOW (ref 135–145)

## 2021-10-29 LAB — RESP PANEL BY RT-PCR (FLU A&B, COVID) ARPGX2
Influenza A by PCR: NEGATIVE
Influenza B by PCR: NEGATIVE
SARS Coronavirus 2 by RT PCR: NEGATIVE

## 2021-10-29 LAB — BLOOD GAS, VENOUS
Acid-Base Excess: 20.1 mmol/L — ABNORMAL HIGH (ref 0.0–2.0)
Bicarbonate: 46.5 mmol/L — ABNORMAL HIGH (ref 20.0–28.0)
O2 Saturation: 83.7 %
Patient temperature: 37
pCO2, Ven: 61 mmHg — ABNORMAL HIGH (ref 44.0–60.0)
pH, Ven: 7.49 — ABNORMAL HIGH (ref 7.250–7.430)
pO2, Ven: 44 mmHg (ref 32.0–45.0)

## 2021-10-29 LAB — CBC
HCT: 34.1 % — ABNORMAL LOW (ref 36.0–46.0)
Hemoglobin: 11.5 g/dL — ABNORMAL LOW (ref 12.0–15.0)
MCH: 28.8 pg (ref 26.0–34.0)
MCHC: 33.7 g/dL (ref 30.0–36.0)
MCV: 85.3 fL (ref 80.0–100.0)
Platelets: 97 10*3/uL — ABNORMAL LOW (ref 150–400)
RBC: 4 MIL/uL (ref 3.87–5.11)
RDW: 13.5 % (ref 11.5–15.5)
WBC: 3.1 10*3/uL — ABNORMAL LOW (ref 4.0–10.5)
nRBC: 0 % (ref 0.0–0.2)

## 2021-10-29 LAB — MAGNESIUM: Magnesium: 1.8 mg/dL (ref 1.7–2.4)

## 2021-10-29 LAB — LACTIC ACID, PLASMA: Lactic Acid, Venous: 1.2 mmol/L (ref 0.5–1.9)

## 2021-10-29 MED ORDER — ACETAMINOPHEN 650 MG RE SUPP
650.0000 mg | Freq: Four times a day (QID) | RECTAL | Status: DC | PRN
  Filled 2021-10-29: qty 1

## 2021-10-29 MED ORDER — POTASSIUM CHLORIDE IN NACL 40-0.9 MEQ/L-% IV SOLN
INTRAVENOUS | Status: DC
  Filled 2021-10-29 (×2): qty 1000

## 2021-10-29 MED ORDER — MAGNESIUM SULFATE 2 GM/50ML IV SOLN
2.0000 g | Freq: Once | INTRAVENOUS | Status: AC
  Administered 2021-10-29: 2 g via INTRAVENOUS
  Filled 2021-10-29: qty 50

## 2021-10-29 MED ORDER — LACTATED RINGERS IV BOLUS
1000.0000 mL | Freq: Once | INTRAVENOUS | Status: AC
  Administered 2021-10-29: 1000 mL via INTRAVENOUS

## 2021-10-29 MED ORDER — PANTOPRAZOLE SODIUM 40 MG IV SOLR
40.0000 mg | Freq: Two times a day (BID) | INTRAVENOUS | Status: DC
  Administered 2021-10-29: 40 mg via INTRAVENOUS
  Filled 2021-10-29: qty 40

## 2021-10-29 MED ORDER — POTASSIUM CHLORIDE 10 MEQ/100ML IV SOLN
10.0000 meq | Freq: Once | INTRAVENOUS | Status: AC
  Administered 2021-10-29: 10 meq via INTRAVENOUS
  Filled 2021-10-29: qty 100

## 2021-10-29 MED ORDER — METOCLOPRAMIDE HCL 5 MG/ML IJ SOLN
10.0000 mg | Freq: Four times a day (QID) | INTRAMUSCULAR | Status: DC
Start: 2021-10-29 — End: 2021-10-30
  Administered 2021-10-29 (×2): 10 mg via INTRAVENOUS
  Filled 2021-10-29 (×2): qty 2

## 2021-10-29 MED ORDER — ACETAMINOPHEN 325 MG PO TABS
650.0000 mg | ORAL_TABLET | Freq: Four times a day (QID) | ORAL | Status: DC | PRN
  Administered 2021-10-30 (×2): 650 mg via ORAL
  Filled 2021-10-29 (×2): qty 2

## 2021-10-29 MED ORDER — PANTOPRAZOLE SODIUM 40 MG IV SOLR
40.0000 mg | INTRAVENOUS | Status: DC
  Administered 2021-10-29: 40 mg via INTRAVENOUS
  Filled 2021-10-29: qty 40

## 2021-10-29 MED ORDER — MAGNESIUM SULFATE 2 GM/50ML IV SOLN
2.0000 g | Freq: Once | INTRAVENOUS | Status: DC
  Filled 2021-10-29: qty 50

## 2021-10-29 MED ORDER — POTASSIUM CHLORIDE 10 MEQ/100ML IV SOLN
10.0000 meq | INTRAVENOUS | Status: AC
Start: 2021-10-29 — End: 2021-10-29
  Administered 2021-10-29 (×4): 10 meq via INTRAVENOUS
  Filled 2021-10-29: qty 100

## 2021-10-29 NOTE — ED Triage Notes (Signed)
Pt in via EMS from [facility] for fall. Pt weak and not able to help EMS when attempting to get her off the floor. Per husband pt has been this way for 2 months. Pt is scheduled to have hernia surgery tomorrow. CBG 109

## 2021-10-29 NOTE — H&P (Signed)
History and Physical    Tiffany [name] XBM:[id number] DOB: [birth year] DOA: 10/29/2021  PCP: Eddie Dibbles, MD   Patient coming from: Home  I have personally briefly reviewed patient's old medical records in [facility]  Chief Complaint: Weakness                               Nausea/vomiting  Most of the history was obtained from patient's significant other who was at the bedside.  HPI: Tiffany [name] is a 60 y.o. female with medical history significant for morbid obesity with a BMI of 46, history of GERD status post nissen fundoplication, bipolar disorder, COPD, migraines who presents to the ER via EMS for evaluation of weakness and frequent falls. He states that patient has had nausea and vomiting for weeks and has not been able to tolerate any oral intake.   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  She was discharged home in stable condition and placed on twice daily PPI. Patient was seen in the emergency room at [facility] on 10/27 and they left AGAINST MEDICAL ADVICE. According to her significant other she has had multiple falls at home and is very weak and so he brought her back to the ER for further evaluation. She is lethargic but arouses to verbal stimuli and denies having any chest pain, no shortness of breath, no abdominal pain, no urinary symptoms, no fever, no chills, no dizziness, no lightheadedness, no headache, no palpitations, no diaphoresis, no leg swelling, no cough, no focal deficits or blurred vision. Venous blood gas 7.49/61/44/20/83.7 Labs show sodium 134, potassium 2.4, chloride 86, bicarb 34, glucose 97, BUN 11, creatinine 0.80, calcium 8.6, anion gap 14, magnesium 1.8, lactic acid 1.2, white count 3.1, hemoglobin 11.5, hematocrit 34.1, MCV 85.3, RDW 13.5, platelet count 97 Respiratory viral panel  is negative Twelve-lead EKG reviewed by me shows normal sinus rhythm, prolonged QT    ED Course: Patient is a 60 year old female who presents to the ER for evaluation of generalized weakness, frequent falls, poor oral intake secondary to nausea and vomiting. Labs reveal evidence of contraction alkalosis with severe hypokalemia. She will be admitted to the hospital for further evaluation.     Review of Systems: As per HPI otherwise all other systems reviewed and negative.    Past Medical History:  Diagnosis Date   Asthma    Bipolar 1 disorder (HCC)    COPD (chronic obstructive pulmonary disease) (HCC)    Enlarged heart    GERD (gastroesophageal reflux disease)    Migraines     Past Surgical History:  Procedure Laterality Date   ABDOMINAL HYSTERECTOMY     ABDOMINAL SURGERY     "For acid reflux"   CESAREAN SECTION       reports that she has never smoked. She has never used smokeless tobacco. She reports that she does not drink alcohol and does not use drugs.  Allergies  Allergen Reactions   Dairy Aid [Tilactase] Other (See Comments)    Exacerbates asthma   Egg [Eggs Or Egg-Derived Products] Other (See Comments)    Exacerbated asthma   Milk-Related Compounds     Family History  Problem Relation Age of Onset   Hypertension Mother       Prior to Admission medications  Medication Sig Start Date End Date Taking? Authorizing Provider  Alum & Mag Hydroxide-Simeth (GI COCKTAIL) SUSP suspension Take 30 mLs by mouth 2 (two) times daily as needed for indigestion (abd pain). Shake well. Each dose to containe 43mL maalox and 67mL viscous lidocaine 09/20/20   Minna Antis, MD  atorvastatin (LIPITOR) 80 MG tablet Take 80 mg by mouth daily.    [provider]  metoCLOPramide (REGLAN) 10 MG tablet Take 1 tablet (10 mg total) by mouth 3 (three) times daily with meals for 5 days. 08/12/21 08/17/21  Gilles Chiquito, MD  naproxen (NAPROSYN) 500 MG tablet Take 1 tablet  (500 mg total) by mouth 2 (two) times daily with a meal. 10/12/21   Sharman Cheek, MD  permethrin (ELIMITE) 5 % cream Thoroughly massage cream (using about half the tube) from head to soles of feet; leave on for 8 to 14 hours before removing (shower or bath); 10/12/21   Sharman Cheek, MD  prochlorperazine (COMPAZINE) 10 MG tablet Take 1 tablet (10 mg total) by mouth every 6 (six) hours as needed for nausea or vomiting. 07/23/21   Triplett, Cari B, FNP  sucralfate (CARAFATE) 1 g tablet Take 1 tablet (1 g total) by mouth 4 (four) times daily for 5 days. 08/12/21 08/17/21  Gilles Chiquito, MD  traMADol (ULTRAM) 50 MG tablet Take 1 tablet (50 mg total) by mouth every 12 (twelve) hours as needed. 10/20/21   Joni Reining, PA-C    Physical Exam: Vitals:   10/29/21 0915 10/29/21 0923 10/29/21 0936 10/29/21 1325  BP:  136/76  131/80  Pulse:  78  78  Resp:  19  12  Temp:   99.1 F (37.3 C)   TempSrc:   Oral   SpO2:  96%  97%  Weight: (!) 143 kg     Height: 5\' 9"  (1.753 m)        Vitals:   10/29/21 0915 10/29/21 0923 10/29/21 0936 10/29/21 1325  BP:  136/76  131/80  Pulse:  78  78  Resp:  19  12  Temp:   99.1 F (37.3 C)   TempSrc:   Oral   SpO2:  96%  97%  Weight: (!) 143 kg     Height: 5\' 9"  (1.753 m)         Constitutional: Lethargic but opens eyes to verbal stimuli. Not in any apparent distress.  Acutely ill-appearing.  Appears unkept HEENT:      Head: Normocephalic and atraumatic.         Eyes: PERLA, EOMI, Conjunctivae are normal. Sclera is non-icteric.       Mouth/Throat: Mucous membranes are moist.       Neck: Supple with no signs of meningismus. Cardiovascular: Regular rate and rhythm. No murmurs, gallops, or rubs. 2+ symmetrical distal pulses are present . No JVD. No LE edema Respiratory: Respiratory effort normal .Lungs sounds clear bilaterally. No wheezes, crackles, or rhonchi.  Gastrointestinal: Soft, non tender, and non distended with positive bowel sounds.   Genitourinary: No CVA tenderness. Musculoskeletal: Nontender with normal range of motion in all extremities. No cyanosis, or erythema of extremities. Neurologic:  Face is symmetric. Moving all extremities. No gross focal neurologic deficits . Skin: Skin is warm, dry.  Skin lesions on the face, anterior chest wall, abdomen Psychiatric: Depressed mood and Flat affect    Labs on Admission: I have personally reviewed following labs and imaging studies  CBC: Recent Labs  Lab 10/26/21 1443 10/29/21 0917  WBC  3.0* 3.1*  NEUTROABS 1.5*  --   HGB 11.9* 11.5*  HCT 35.3* 34.1*  MCV 84.9 85.3  PLT 123* 97*   Basic Metabolic Panel: Recent Labs  Lab 10/26/21 1443 10/29/21 0917  NA 133* 134*  K 2.6* 2.4*  CL 87* 86*  CO2 33* 34*  GLUCOSE 93 97  BUN 11 11  CREATININE 0.69 0.80  CALCIUM 8.8* 8.6*  MG  --  1.8   GFR: Estimated Creatinine Clearance: 117.3 mL/min (by C-G formula based on SCr of 0.8 mg/dL). Liver Function Tests: Recent Labs  Lab 10/26/21 1443  AST 22  ALT 18  ALKPHOS 39  BILITOT 2.3*  PROT 7.2  ALBUMIN 3.4*   No results for input(s): LIPASE, AMYLASE in the last 168 hours. No results for input(s): AMMONIA in the last 168 hours. Coagulation Profile: No results for input(s): INR, PROTIME in the last 168 hours. Cardiac Enzymes: No results for input(s): CKTOTAL, CKMB, CKMBINDEX, TROPONINI in the last 168 hours. BNP (last 3 results) No results for input(s): PROBNP in the last 8760 hours. HbA1C: No results for input(s): HGBA1C in the last 72 hours. CBG: No results for input(s): GLUCAP in the last 168 hours. Lipid Profile: No results for input(s): CHOL, HDL, LDLCALC, TRIG, CHOLHDL, LDLDIRECT in the last 72 hours. Thyroid Function Tests: No results for input(s): TSH, T4TOTAL, FREET4, T3FREE, THYROIDAB in the last 72 hours. Anemia Panel: No results for input(s): VITAMINB12, FOLATE, FERRITIN, TIBC, IRON, RETICCTPCT in the last 72 hours. Urine analysis:     Component Value Date/Time   COLORURINE YELLOW (A) 07/10/2021 1619   APPEARANCEUR CLOUDY (A) 07/10/2021 1619   APPEARANCEUR Clear 05/14/2013 1809   LABSPEC 1.025 07/10/2021 1619   LABSPEC 1.013 05/14/2013 1809   PHURINE 5.0 07/10/2021 1619   GLUCOSEU NEGATIVE 07/10/2021 1619   GLUCOSEU Negative 05/14/2013 1809   HGBUR MODERATE (A) 07/10/2021 1619   BILIRUBINUR NEGATIVE 07/10/2021 1619   BILIRUBINUR Negative 05/14/2013 1809   KETONESUR NEGATIVE 07/10/2021 1619   PROTEINUR 30 (A) 07/10/2021 1619   NITRITE NEGATIVE 07/10/2021 1619   LEUKOCYTESUR NEGATIVE 07/10/2021 1619   LEUKOCYTESUR Negative 05/14/2013 1809    Radiological Exams on Admission: No results found.   Assessment/Plan Principal Problem:   Hypokalemia Active Problems:   Frequent falls   Refractory nausea and vomiting   Thrombocytopenia (HCC)   GERD (gastroesophageal reflux disease)     Patient 60 year old female who presents to the ER for evaluation of multiple falls at home, generalized weakness as well as refractory nausea and vomiting.    Hypokalemia Secondary to GI losses from refractory nausea and vomiting most likely related to patient's known history of GERD Supplement potassium and magnesium     Refractory nausea and vomiting In a patient with a known history of GERD status post Nissen fundoplication Keep patient n.p.o. Place patient on IV PPI Aggressive IV fluid resuscitation Place patient on scheduled regimen     Frequent falls Most likely related to volume depletion from refractory nausea and vomiting Place patient on fall precautions PT evaluation once improved     Thrombocytopenia Unclear etiology No evidence of bleeding at this time Monitor platelet count closely   DVT prophylaxis: SCD Code Status: full code  Family Communication: Greater than 50% of time was spent discussing patient's condition and plan of care with her significant other at the bedside.  All questions and  concerns have been addressed.  He verbalizes understanding and agrees with the plan. Disposition Plan: Back to previous home environment  Consults called: Physical therapy/gastroenterology Status:At the time of admission, it appears that the appropriate admission status for this patient is inpatient. This is judged to be reasonable and necessary to provide the required intensity of service to ensure the patient's safety given the presenting symptoms, physical exam findings, and initial radiographic and laboratory data in the context of their comorbid conditions. Patient requires inpatient status due to high intensity of service, high risk for further deterioration and high frequency of surveillance required.     Lucile Shutters MD [facility]     10/29/2021, 2:32 PM

## 2021-10-29 NOTE — ED Triage Notes (Signed)
Pt husband reports pt with increasing weakness, decreased PO intake and multiple falls over the last several weeks. Husband reports pt was here last week but left without being seen. Pt reports not yet scheduled for hernia surgery.

## 2021-10-29 NOTE — ED Notes (Addendum)
Patient moved from stretcher to hospital bed for comfort.  Patient noted to have skin breakdown to groin.  Cleaned area with soap and water and allowed to air dry.  Moisture cream placed.  No wounds, redness, and skin breakdown noted to buttocks or back

## 2021-10-29 NOTE — ED Provider Notes (Signed)
[facility] Emergency Department Provider Note   ____________________________________________   Event Date/Time   First MD Initiated Contact with Patient 10/29/21 1022     (approximate)  I have reviewed the triage vital signs and the nursing notes.   HISTORY  Chief Complaint Weakness    HPI Tiffany [name] is a 60 y.o. female who comes in for increasing weakness decreasing p.o. intake and multiple falls.  Patient reported by her husband who provides the history up to this point to be vomiting everything up and cannot keep anything down except for little bit of Pedialyte.  She has a history of hiatal hernia and Nissen fundoplication.  She has a [place] esophageal diverticulum and distal esophagitis reported to be LA grade D.  Felt likely to be due to stasis.  Patient is living in the [facility].  She is so weak that when she fell she could not get up herself and EMS was called. At this point patient wakes up.  She and her husband reports she has surgery for the esophageal problem scheduled for tomorrow at [facility].  I will get some labs back and see if perhaps [facility] can admit her this morning and address her hypokalemia which is low enough that she probably can get surgery until it is fixed.  Patient agrees with her husband she did not hit her head today.         Past Medical History:  Diagnosis Date   Asthma    Bipolar 1 disorder (HCC)    COPD (chronic obstructive pulmonary disease) (HCC)    Enlarged heart    GERD (gastroesophageal reflux disease)    Migraines     Patient Active Problem List   Diagnosis Date Noted   Hypokalemia 10/29/2021   Frequent falls 10/29/2021   Refractory nausea and vomiting 10/29/2021   Thrombocytopenia (HCC) 10/29/2021   GERD (gastroesophageal reflux disease) 10/29/2021    Past Surgical History:  Procedure Laterality Date   ABDOMINAL HYSTERECTOMY     ABDOMINAL SURGERY     "For acid reflux"   CESAREAN SECTION       Prior to Admission medications   Medication Sig Start Date End Date Taking? Authorizing Provider  atorvastatin (LIPITOR) 80 MG tablet Take 80 mg by mouth daily.   Yes [provider]  DULoxetine (CYMBALTA) 30 MG capsule Take 30 mg by mouth daily. 09/05/21 09/05/22 Yes [provider]  FLOVENT HFA 110 MCG/ACT inhaler Inhale 2 puffs into the lungs 2 (two) times daily. 10/20/21  Yes [provider]  levocetirizine (XYZAL) 5 MG tablet Take 5 mg by mouth every evening. 04/03/21 04/03/22 Yes [provider]  naproxen (NAPROSYN) 500 MG tablet Take 1 tablet (500 mg total) by mouth 2 (two) times daily with a meal. 10/12/21  Yes Sharman Cheek, MD  pantoprazole (PROTONIX) 40 MG tablet Take 40 mg by mouth 2 (two) times daily. 08/17/21  Yes [provider]  permethrin (ELIMITE) 5 % cream Thoroughly massage cream (using about half the tube) from head to soles of feet; leave on for 8 to 14 hours before removing (shower or bath); 10/12/21  Yes Sharman Cheek, MD  promethazine (PHENERGAN) 25 MG suppository Place 25 mg rectally every 6 (six) hours as needed. 08/21/21  Yes [provider]  SPIRIVA HANDIHALER 18 MCG inhalation capsule 1 capsule at bedtime. 08/02/21  Yes [provider]  SUMAtriptan (IMITREX) 100 MG tablet Take 100 mg by mouth daily as needed. 08/02/21  Yes [provider]  traMADol (ULTRAM) 50 MG tablet Take 1 tablet (50 mg total) by mouth every 12 (twelve) hours as needed. 10/20/21  Yes Joni Reining, PA-C  Alum & Mag Hydroxide-Simeth (GI COCKTAIL) SUSP suspension Take 30 mLs by mouth 2 (two) times daily as needed for indigestion (abd pain). Shake well. Each dose to containe 35mL maalox and 41mL viscous lidocaine Patient not taking: Reported on 10/29/2021 09/20/20   Minna Antis, MD  metoCLOPramide (REGLAN) 10 MG tablet Take 1 tablet (10 mg total) by mouth 3 (three) times daily with meals for 5 days. 08/12/21 08/17/21  Gilles Chiquito, MD  prochlorperazine (COMPAZINE) 10 MG tablet Take 1 tablet (10 mg total) by mouth every 6 (six) hours as needed for nausea or vomiting. Patient not taking: No sig reported 07/23/21   Kem Boroughs B, FNP  sucralfate (CARAFATE) 1 g tablet Take 1 tablet (1 g total) by mouth 4 (four) times daily for 5 days. Patient not taking: Reported on 10/29/2021 08/12/21 08/17/21  Gilles Chiquito, MD  traZODone (DESYREL) 100 MG tablet Take 50 mg by mouth at bedtime as needed. 05/08/21   [provider]    Allergies Dairy aid [tilactase], Egg [eggs or egg-derived products], and Milk-related compounds  Family History  Problem Relation Age of Onset   Hypertension Mother     Social History Social History   Tobacco Use   Smoking status: Never   Smokeless tobacco: Never  Substance Use Topics   Alcohol use: Never   Drug use: Never    Review of Systems  Constitutional: No fever/chills Eyes: No visual changes. ENT: No sore throat. Cardiovascular: Denies chest pain. Respiratory: Denies shortness of breath. Gastrointestinal: No abdominal pain.   No diarrhea.  No constipation. Genitourinary: Negative for dysuria. Musculoskeletal: Negative for back pain. Skin: Negative for rash. Neurological: Negative for headaches, focal weakness  ____________________________________________   PHYSICAL EXAM:  VITAL SIGNS: ED Triage Vitals  Enc Vitals Group     BP 10/29/21 0923 136/76     Pulse Rate 10/29/21 0923 78     Resp 10/29/21 0923 19     Temp 10/29/21 0936 99.1 F (37.3 C)     Temp Source 10/29/21 0936 Oral     SpO2 10/29/21 0923 96 %     Weight 10/29/21 0915 (!) 315 lb 4.1 oz (143 kg)     Height 10/29/21 0915 5\' 9"  (1.753 m)     Head Circumference --      Peak Flow --      Pain Score 10/29/21 0914 6     Pain Loc --      Pain Edu? --      Excl. in GC? --     Constitutional: Alert and oriented.  Very tired and sleepy Eyes: Conjunctivae are normal. PER Head:  Atraumatic. Nose: No congestion/rhinnorhea. Mouth/Throat: Mucous membranes are slightly dry Neck: No stridor.  Cardiovascular: Normal rate, regular rhythm. Grossly normal heart sounds.  Good peripheral circulation. Respiratory: Normal respiratory effort.  No retractions. Lungs CTAB. Gastrointestinal: Soft and nontender. No distention. No abdominal bruits. Musculoskeletal: No lower extremity tenderness nor edema. Neurologic:  Normal speech and language. No gross focal neurologic deficits are appreciated.  Skin:  Skin is warm, dry patient has several open sores that are small apparently from scratching.  She also has an area in the groin where the skin is completely denuded and oozing blood.  This appears to be due to fungal infection and maceration.  ____________________________________________   LABS (all labs ordered are listed, but only abnormal results are displayed)  Labs Reviewed  BASIC METABOLIC PANEL - Abnormal; Notable for the following components:      Result Value   Sodium 134 (*)    Potassium 2.4 (*)    Chloride 86 (*)    CO2 34 (*)    Calcium 8.6 (*)    All other components within normal limits  CBC - Abnormal; Notable for the following components:   WBC 3.1 (*)    Hemoglobin 11.5 (*)    HCT 34.1 (*)    Platelets 97 (*)    All other components within normal limits  BLOOD GAS, VENOUS - Abnormal; Notable for the following components:   pH, Ven 7.49 (*)    pCO2, Ven 61 (*)    Bicarbonate 46.5 (*)    Acid-Base Excess 20.1 (*)    All other components within normal limits  RESP PANEL BY RT-PCR (FLU A&B, COVID) ARPGX2  MAGNESIUM  LACTIC ACID, PLASMA  URINALYSIS, ROUTINE W REFLEX MICROSCOPIC  HIV ANTIBODY (ROUTINE TESTING W REFLEX)  CBG MONITORING, ED   ____________________________________________  EKG EKG read interpreted by me shows normal sinus rhythm rate of 79 normal axis nonspecific ST-T wave changes QTC is 520 ms which is  prolonged.  ____________________________________________  RADIOLOGY Jill Poling, personally viewed and evaluated these images (plain radiographs) as part of my medical decision making, as well as reviewing the written report by the radiologist. CT of the head and neck read by radiology reviewed by me are negative for acute changes. ED MD interpretation:    Official radiology report(s): CT Head Wo Contrast  Result Date: 10/29/2021 CLINICAL DATA:  Altered mental status EXAM: CT HEAD WITHOUT CONTRAST TECHNIQUE: Contiguous axial images were obtained from the base of the skull through the vertex without intravenous contrast. COMPARISON:  10/26/2021 FINDINGS: Brain: There is no evidence of recent bleeding within the cranium. Ventricles are not dilated. There is no shift of midline structures. Vascular: Unremarkable Skull: No fracture is seen in calvarium. Sinuses/Orbits: Unremarkable Other: There is low-density in enlarged sella suggesting empty sella. IMPRESSION: No acute intracranial findings are seen. There are no signs of bleeding within the cranium. Empty sella.No significant interval changes are noted since 10/26/2021. Electronically Signed   By: Ernie Avena M.D.   On: 10/29/2021 14:55   CT CERVICAL SPINE WO CONTRAST  Result Date: 10/29/2021 CLINICAL DATA:  Trauma, fall EXAM: CT CERVICAL SPINE WITHOUT CONTRAST TECHNIQUE: Multidetector CT imaging of the cervical spine was performed without intravenous contrast. Multiplanar CT image reconstructions were also generated. COMPARISON:  11/11/2020 FINDINGS: Examination is less than optimal due to the patient's body habitus. Alignment: Alignment of posterior margins of vertebral bodies is unremarkable. There is straightening of lordosis. Skull base and vertebrae: Unremarkable Soft tissues and spinal canal: Degenerative changes are noted with disc space narrowing, bony spurs and facet hypertrophy. There is mild spinal stenosis at C5-C6 and  C6-C7 levels. There is encroachment of neural foramina at multiple levels, more so on the left side at C5-C6 level. Prevertebral soft tissues are unremarkable. Disc levels:  As described above Upper chest: Unremarkable Other: None IMPRESSION: No recent fracture is seen. Alignment of posterior margins of the vertebral bodies is unremarkable. Cervical spondylosis. No significant interval changes are noted. Electronically Signed   By: Ernie Avena M.D.   On: 10/29/2021 15:04    ____________________________________________   PROCEDURES  Procedure(s) performed (including Critical Care):  Procedures  ____________________________________________   INITIAL IMPRESSION / ASSESSMENT AND PLAN / ED COURSE  We attempted to stand the patient up but even after fluids and magnesium and potassium IV she still very weak and really could not stand up her feet Sliding along the floor and she kept on leaning backwards as though she was going to fall onto the chair so we will let her sit back down.  She promptly went back to sleep.  I should add that the patient does not have an appointment for surgery tomorrow she has an appointment to see the surgeon to begin discussions for possible surgery tomorrow.  This is an outpatient clinic appointment.  I discussed her with [facility] who provided me with that information.  We will need to continue to hydrate her and monitor her electrolytes.  She is alkalotic probably from vomiting stomach acid.  Hopefully with further IV hydration and time she will be out of tolerated p.o. and regain her strength and be discharged.             ____________________________________________   FINAL CLINICAL IMPRESSION(S) / ED DIAGNOSES  Final diagnoses:  Weakness  [facility])     ED Discharge Orders     None        Note:  This document was prepared using Dragon voice recognition software and may include unintentional dictation errors.    Arnaldo Natal,  MD 10/29/21 [phone]

## 2021-10-29 NOTE — Consult Note (Signed)
Melodie Bouillon, MD [address], [address], [place], [place], [address] [address], [address], [place], [place], [address] Phone: [phone]  Fax: [phone]  Consultation  Referring Provider:     Dr. Joylene Igo Primary Care Physician:  Eddie Dibbles, MD Reason for Consultation:     Nausea vomiting  Date of Admission:  10/29/2021 Date of Consultation:  10/29/2021         HPI:   Tiffany [name] is a 60 y.o. female with history of morbid obesity, GERD status post Nissen fundoplication, bipolar disorder, COPD, with GI being consulted for nausea vomiting.  As per H&P most of the history was obtained from patient and significant other who was present at bedside on admission.  On my evaluation, family is not present at bedside.  ER note states patient comes in for increasing weakness, decrease in p.o. intake, multiple falls, and vomiting as per husband.  Patient does not answer many questions herself.  Previous records show the patient underwent upper endoscopy at [facility] on 08/17/2021.  Indication was nausea with vomiting, and abnormal barium esophagram.  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  Fundoplication with intact wrap.  Recommendations were to increase PPI to twice daily, clinic follow-up to discuss endoscopic treatment of diverticulum versus surgical treatment.  Esophagram on 08/03/2021 reported outpouching of the inferior aspect of the esophagus below the GE junction, with pooling of contrast, small sliding hiatal hernia.  Family medicine clinic note from 08/21/2021 also report 15 to 20 pound weight loss, and Phenergan was recommended rectally, with changing of PPI to oral suspension instead of pills.  The note also states that patient has had a colonoscopy done 3 years before and they were awaiting records  Imaging on admission included CT head and CT spine, with no acute intracranial findings.  Past Medical History:   Diagnosis Date   Asthma    Bipolar 1 disorder (HCC)    COPD (chronic obstructive pulmonary disease) (HCC)    Enlarged heart    GERD (gastroesophageal reflux disease)    Migraines     Past Surgical History:  Procedure Laterality Date   ABDOMINAL HYSTERECTOMY     ABDOMINAL SURGERY     "For acid reflux"   CESAREAN SECTION      Prior to Admission medications   Medication Sig Start Date End Date Taking? Authorizing Provider  atorvastatin (LIPITOR) 80 MG tablet Take 80 mg by mouth daily.   Yes [provider]  DULoxetine (CYMBALTA) 30 MG capsule Take 30 mg by mouth daily. 09/05/21 09/05/22 Yes [provider]  FLOVENT HFA 110 MCG/ACT inhaler Inhale 2 puffs into the lungs 2 (two) times daily. 10/20/21  Yes [provider]  levocetirizine (XYZAL) 5 MG tablet Take 5 mg by mouth every evening. 04/03/21 04/03/22 Yes [provider]  naproxen (NAPROSYN) 500 MG tablet Take 1 tablet (500 mg total) by mouth 2 (two) times daily with a meal. 10/12/21  Yes Sharman Cheek, MD  pantoprazole (PROTONIX) 40 MG tablet Take 40 mg by mouth 2 (two) times daily. 08/17/21  Yes [provider]  permethrin (ELIMITE) 5 % cream Thoroughly massage cream (using about half the tube) from head to soles of feet; leave on for 8 to 14 hours before removing (shower or bath); 10/12/21  Yes Sharman Cheek, MD  promethazine (PHENERGAN) 25 MG suppository Place 25 mg rectally every 6 (six) hours as needed. 08/21/21  Yes [provider]  SPIRIVA HANDIHALER 18 MCG inhalation capsule 1 capsule at bedtime. 08/02/21  Yes [provider]  SUMAtriptan (IMITREX) 100 MG tablet Take 100 mg by mouth daily as needed. 08/02/21  Yes [provider]  traMADol (ULTRAM) 50 MG tablet Take 1 tablet (50 mg total) by mouth every 12 (twelve) hours as needed. 10/20/21  Yes Joni Reining, PA-C  Alum & Mag Hydroxide-Simeth (GI COCKTAIL) SUSP suspension Take 30 mLs by mouth 2 (two) times  daily as needed for indigestion (abd pain). Shake well. Each dose to containe 52mL maalox and 32mL viscous lidocaine Patient not taking: Reported on 10/29/2021 09/20/20   Minna Antis, MD  metoCLOPramide (REGLAN) 10 MG tablet Take 1 tablet (10 mg total) by mouth 3 (three) times daily with meals for 5 days. 08/12/21 08/17/21  Gilles Chiquito, MD  prochlorperazine (COMPAZINE) 10 MG tablet Take 1 tablet (10 mg total) by mouth every 6 (six) hours as needed for nausea or vomiting. Patient not taking: No sig reported 07/23/21   Kem Boroughs B, FNP  sucralfate (CARAFATE) 1 g tablet Take 1 tablet (1 g total) by mouth 4 (four) times daily for 5 days. Patient not taking: Reported on 10/29/2021 08/12/21 08/17/21  Gilles Chiquito, MD  traZODone (DESYREL) 100 MG tablet Take 50 mg by mouth at bedtime as needed. 05/08/21   [provider]    Family History  Problem Relation Age of Onset   Hypertension Mother      Social History   Tobacco Use   Smoking status: Never   Smokeless tobacco: Never  Substance Use Topics   Alcohol use: Never   Drug use: Never    Allergies as of 10/29/2021 - Review Complete 10/29/2021  Allergen Reaction Noted   Dairy aid [tilactase] Other (See Comments) 09/18/2020   Egg [eggs or egg-derived products] Other (See Comments) 09/18/2020   Milk-related compounds  10/26/2021    Review of Systems:    Unable to obtain as patient does not answer any questions   Physical Exam:  Constitutional: General:    Well-developed, well-nourished, pleasant  in NAD BP 137/72   Pulse 76   Temp 99.1 F (37.3 C) (Oral)   Resp 11   Ht 5\' 9"  (1.753 m)   Wt (!) 143 kg   SpO2 100%   BMI 46.56 kg/m   Eyes:  Sclera clear, no icterus.   Conjunctiva pink. PERRLA  Ears:  No scars, lesions or masses, Normal auditory acuity. Nose:  No deformity, discharge, or lesions. Mouth:  No deformity or lesions, oropharynx pink & moist.  Neck:  Supple; no masses or  thyromegaly.  Respiratory: Normal respiratory effort, Normal percussion  Gastrointestinal:  Normal bowel sounds.  No bruits.  Soft, non-tender and non-distended without masses, hepatosplenomegaly or hernias noted.  No guarding or rebound tenderness.     Cardiac: No clubbing or edema.  No cyanosis. Normal posterior tibial pedal pulses noted.  Lymphatic:  No significant cervical or axillary adenopathy.  Musculoskeletal:  Head normocephalic, atraumatic. Symmetrical without gross deformities. 5/5  Lower extremity strength bilaterally.  Skin: Warm. Intact without significant lesions or rashes. No jaundice.  Neurologic:  Face symmetrical, tongue midline, Normal sensation to touch;  grossly normal neurologically.  Psych:  Alert and cooperative.  Fatigued  LAB RESULTS: Recent Labs    10/29/21 0917  WBC 3.1*  HGB 11.5*  HCT 34.1*  PLT 97*   BMET Recent Labs    10/29/21 0917  NA 134*  K 2.4*  CL  86*  CO2 34*  GLUCOSE 97  BUN 11  CREATININE 0.80  CALCIUM 8.6*   LFT No results for input(s): PROT, ALBUMIN, AST, ALT, ALKPHOS, BILITOT, BILIDIR, IBILI in the last 72 hours. PT/INR No results for input(s): LABPROT, INR in the last 72 hours.  STUDIES: CT Head Wo Contrast  Result Date: 10/29/2021 CLINICAL DATA:  Altered mental status EXAM: CT HEAD WITHOUT CONTRAST TECHNIQUE: Contiguous axial images were obtained from the base of the skull through the vertex without intravenous contrast. COMPARISON:  10/26/2021 FINDINGS: Brain: There is no evidence of recent bleeding within the cranium. Ventricles are not dilated. There is no shift of midline structures. Vascular: Unremarkable Skull: No fracture is seen in calvarium. Sinuses/Orbits: Unremarkable Other: There is low-density in enlarged sella suggesting empty sella. IMPRESSION: No acute intracranial findings are seen. There are no signs of bleeding within the cranium. Empty sella.No significant interval changes are noted since 10/26/2021.  Electronically Signed   By: Ernie Avena M.D.   On: 10/29/2021 14:55   CT CERVICAL SPINE WO CONTRAST  Result Date: 10/29/2021 CLINICAL DATA:  Trauma, fall EXAM: CT CERVICAL SPINE WITHOUT CONTRAST TECHNIQUE: Multidetector CT imaging of the cervical spine was performed without intravenous contrast. Multiplanar CT image reconstructions were also generated. COMPARISON:  11/11/2020 FINDINGS: Examination is less than optimal due to the patient's body habitus. Alignment: Alignment of posterior margins of vertebral bodies is unremarkable. There is straightening of lordosis. Skull base and vertebrae: Unremarkable Soft tissues and spinal canal: Degenerative changes are noted with disc space narrowing, bony spurs and facet hypertrophy. There is mild spinal stenosis at C5-C6 and C6-C7 levels. There is encroachment of neural foramina at multiple levels, more so on the left side at C5-C6 level. Prevertebral soft tissues are unremarkable. Disc levels:  As described above Upper chest: Unremarkable Other: None IMPRESSION: No recent fracture is seen. Alignment of posterior margins of the vertebral bodies is unremarkable. Cervical spondylosis. No significant interval changes are noted. Electronically Signed   By: Ernie Avena M.D.   On: 10/29/2021 15:04      Impression / Plan:   Tiffany [name] is a 60 y.o. y/o female with admission for weakness, decreased p.o. intake, with GI being consulted for nausea vomiting, with upper endoscopy in August showing grade D esophagitis, esophageal diverticulum, paraesophageal hernia, history of Nissen fundoplication with intact wrap  As per ER notes, patient has a clinic appointment scheduled for discussion of the surgery at [facility]  Unclear if patient has been compliant with her PPI therapy as an outpatient as patient does not provide much history herself  The patient is not taking her medications as prescribed, and was seen to have grade D esophagitis on last endoscopy,  this is likely contributing to her symptoms  Would recommend aggressive management with PPI twice daily  Antireflux measures, including head of bed elevated to 30 degrees  She will likely eventually benefit from surgical opinion for her paraesophageal hernia/esophageal diverticulum and was already planned at [facility]  Repeat upper endoscopy on this admission may be able to determine if her esophagitis has improved  However, she will need to be optimized further prior to endoscopy.  Potassium is 2.4 and this will need to be corrected to above 3 prior to endoscopic interventions.  Patient is currently on IV fluids and is receiving potassium supplementation with her fluids.  Recheck and replace orally or via IV as necessary  No evidence of hematemesis.  No indication for emergent EGD  PPI IV twice  daily (this was ordered as every 24 hours on admission, and I have changed the order) Avoid NSAIDs Antiemetics as needed Please page GI with any acute hemodynamic changes, or signs of active GI bleeding   Dr. Tobi Bastos to follow the patient from tomorrow, and timeline for endoscopy to be determined based on improvement in clinical symptoms and electrolytes  thank you for involving me in the care of this patient.      LOS: 0 days   Pasty Spillers, MD  10/29/2021, 7:31 PM

## 2021-10-30 DIAGNOSIS — E876 Hypokalemia: Secondary | ICD-10-CM | POA: Diagnosis not present

## 2021-10-30 DIAGNOSIS — R112 Nausea with vomiting, unspecified: Secondary | ICD-10-CM | POA: Diagnosis not present

## 2021-10-30 LAB — BASIC METABOLIC PANEL
Anion gap: 10 (ref 5–15)
BUN: 9 mg/dL (ref 6–20)
CO2: 31 mmol/L (ref 22–32)
Calcium: 8.1 mg/dL — ABNORMAL LOW (ref 8.9–10.3)
Chloride: 94 mmol/L — ABNORMAL LOW (ref 98–111)
Creatinine, Ser: 0.61 mg/dL (ref 0.44–1.00)
GFR, Estimated: >60 mL/min (ref >60)
Glucose, Bld: 91 mg/dL (ref 70–99)
Potassium: 3.8 mmol/L (ref 3.5–5.1)
Sodium: 135 mmol/L (ref 135–145)

## 2021-10-30 LAB — CBC WITH DIFFERENTIAL/PLATELET
Abs Immature Granulocytes: 0.03 10*3/uL (ref 0.00–0.07)
Basophils Absolute: 0 10*3/uL (ref 0.0–0.1)
Basophils Relative: 1 %
Eosinophils Absolute: 0 10*3/uL (ref 0.0–0.5)
Eosinophils Relative: 1 %
HCT: 33 % — ABNORMAL LOW (ref 36.0–46.0)
Hemoglobin: 10.9 g/dL — ABNORMAL LOW (ref 12.0–15.0)
Immature Granulocytes: 1 %
Lymphocytes Relative: 26 %
Lymphs Abs: 0.9 10*3/uL (ref 0.7–4.0)
MCH: 27.7 pg (ref 26.0–34.0)
MCHC: 33 g/dL (ref 30.0–36.0)
MCV: 83.8 fL (ref 80.0–100.0)
Monocytes Absolute: 0.3 10*3/uL (ref 0.1–1.0)
Monocytes Relative: 8 %
Neutro Abs: 2.1 10*3/uL (ref 1.7–7.7)
Neutrophils Relative %: 63 %
Platelets: 100 10*3/uL — ABNORMAL LOW (ref 150–400)
RBC: 3.94 MIL/uL (ref 3.87–5.11)
RDW: 13.5 % (ref 11.5–15.5)
WBC: 3.3 10*3/uL — ABNORMAL LOW (ref 4.0–10.5)
nRBC: 0 % (ref 0.0–0.2)

## 2021-10-30 LAB — URINALYSIS, ROUTINE W REFLEX MICROSCOPIC
Bilirubin Urine: NEGATIVE
Glucose, UA: NEGATIVE mg/dL
Ketones, ur: 20 mg/dL — AB
Nitrite: NEGATIVE
Protein, ur: 30 mg/dL — AB
RBC / HPF: >50 RBC/hpf — ABNORMAL HIGH (ref 0–5)
Specific Gravity, Urine: 1.012 (ref 1.005–1.030)
WBC, UA: >50 WBC/hpf — ABNORMAL HIGH (ref 0–5)
pH: 7 (ref 5.0–8.0)

## 2021-10-30 LAB — PHOSPHORUS: Phosphorus: 2.2 mg/dL — ABNORMAL LOW (ref 2.5–4.6)

## 2021-10-30 LAB — IRON AND TIBC
Iron: 124 ug/dL (ref 28–170)
Saturation Ratios: 73 % — ABNORMAL HIGH (ref 10.4–31.8)
TIBC: 171 ug/dL — ABNORMAL LOW (ref 250–450)
UIBC: 47 ug/dL

## 2021-10-30 LAB — VITAMIN B12: Vitamin B-12: 704 pg/mL (ref 180–914)

## 2021-10-30 LAB — HIV ANTIBODY (ROUTINE TESTING W REFLEX): HIV Screen 4th Generation wRfx: NONREACTIVE

## 2021-10-30 LAB — MAGNESIUM: Magnesium: 2.1 mg/dL (ref 1.7–2.4)

## 2021-10-30 LAB — FOLATE: Folate: 2.6 ng/mL — ABNORMAL LOW (ref >5.9)

## 2021-10-30 LAB — VITAMIN D 25 HYDROXY (VIT D DEFICIENCY, FRACTURES): Vit D, 25-Hydroxy: 5.5 ng/mL — ABNORMAL LOW (ref 30–100)

## 2021-10-30 LAB — GLUCOSE, CAPILLARY: Glucose-Capillary: 94 mg/dL (ref 70–99)

## 2021-10-30 MED ORDER — SODIUM CHLORIDE 0.9 % IV SOLN
1.0000 g | INTRAVENOUS | Status: AC
  Administered 2021-10-30 – 2021-11-03 (×5): 1 g via INTRAVENOUS
  Filled 2021-10-30 (×6): qty 10

## 2021-10-30 MED ORDER — METOCLOPRAMIDE HCL 5 MG/ML IJ SOLN
10.0000 mg | Freq: Four times a day (QID) | INTRAMUSCULAR | Status: DC | PRN
  Administered 2021-10-30 – 2021-11-02 (×3): 10 mg via INTRAVENOUS
  Filled 2021-10-30 (×3): qty 2

## 2021-10-30 MED ORDER — PANTOPRAZOLE SODIUM 40 MG IV SOLR: 40.0000 mg | Freq: Two times a day (BID) | INTRAVENOUS | Status: DC | PRN

## 2021-10-30 MED ORDER — K PHOS MONO-SOD PHOS DI & MONO 155-852-130 MG PO TABS
500.0000 mg | ORAL_TABLET | ORAL | Status: AC
  Administered 2021-10-30: 500 mg via ORAL
  Filled 2021-10-30 (×2): qty 2

## 2021-10-30 MED ORDER — FOLIC ACID 1 MG PO TABS
5.0000 mg | ORAL_TABLET | Freq: Every day | ORAL | Status: DC
  Administered 2021-10-30 – 2021-11-08 (×10): 5 mg via ORAL
  Filled 2021-10-30 (×10): qty 5

## 2021-10-30 MED ORDER — VITAMIN D (ERGOCALCIFEROL) 1.25 MG (50000 UNIT) PO CAPS
50000.0000 [IU] | ORAL_CAPSULE | ORAL | Status: DC
Start: 2021-10-30 — End: 2021-11-08
  Administered 2021-10-30 – 2021-11-06 (×2): 50000 [IU] via ORAL
  Filled 2021-10-30 (×3): qty 1

## 2021-10-30 MED ORDER — POTASSIUM PHOSPHATES 15 MMOLE/5ML IV SOLN: 30.0000 mmol | Freq: Once | INTRAVENOUS | Status: DC

## 2021-10-30 MED ORDER — ONDANSETRON HCL 4 MG/2ML IJ SOLN
4.0000 mg | Freq: Four times a day (QID) | INTRAMUSCULAR | Status: DC | PRN
  Administered 2021-10-31 – 2021-11-02 (×5): 4 mg via INTRAVENOUS
  Filled 2021-10-30 (×4): qty 2

## 2021-10-30 MED ORDER — SODIUM CHLORIDE 0.9 % IV SOLN: INTRAVENOUS | Status: DC

## 2021-10-30 NOTE — ED Notes (Signed)
Patient reports "that thing that is suppose to catch my urine is not working.  I feel it all up my back."  Patient informed that Tiffany [name] appears to be working.  Urine noted in canister.  Patient turned and pads checked.  No urine noted.  Pads and brief dry.  Patient informed and voiced understanding.  Will continue to monitor

## 2021-10-30 NOTE — Evaluation (Signed)
Physical Therapy Evaluation Patient Details Name: Tiffany [name] MRN: [id number] DOB: 1961-09-16 Today's Date: 10/30/2021  History of Present Illness  Pt is a 60 y.o. female presenting to hospital 10/30 with increasing weakness, N/V, decreasing p.o. intake, and multiple falls.  Pt seen in ED at [facility] 10/27 and left AMA.  Pt admitted with hypokalemia, refractory N/V, frequent falls, and thrombocytopenia.  PMH includes h/o hiatal hernia, Nissen fundoplication, grade D esophagitis, bipolar 1 disorder, COPD, enlarged heart, and abdominal sx.  Clinical Impression  Patient tolerated session fairly, and was agreeable to treatment. Motivation and energy level was low during session. Patient required Min A for supine to sit bed mobility, however was able to transition back into supine Mod I with increased time and effort. Upon sitting patient reported some dizziness, however vitals remained normal. Patient was educated on foot and hand placement when attempting to perform sit to stand. Due to patient's overall BLE weakness and energy levels patient was unable to complete sit to stand. Patient would continue to benefit from skilled physical therapy in order to improve on patients' bilateral LE strength, balance, gait, and endurance deficits.      Recommendations for follow up therapy are one component of a multi-disciplinary discharge planning process, led by the attending physician.  Recommendations may be updated based on patient status, additional functional criteria and insurance authorization.  Follow Up Recommendations Skilled nursing-short term rehab (<3 hours/day)    Assistance Recommended at Discharge Frequent or constant Supervision/Assistance  Functional Status Assessment Patient has had a recent decline in their functional status and demonstrates the ability to make significant improvements in function in a reasonable and predictable amount of time.  Equipment Recommendations  Rolling walker  (2 wheels)    Recommendations for Other Services       Precautions / Restrictions Precautions Precautions: Fall Restrictions Weight Bearing Restrictions: No      Mobility  Bed Mobility Overal bed mobility: Needs Assistance Bed Mobility: Supine to Sit     Supine to sit: Min assist (at trunk and through hand held assist) Sit to Supine: Mod I with increased time and effort           Transfers Overall transfer level: Needs assistance Equipment used: Rolling walker (2 wheels) Transfers: Sit to/from Stand Sit to Stand:  (was unable to complete due to generalized BLE weakness and decreased effort from patient)                Ambulation/Gait                Stairs            Wheelchair Mobility    Modified Rankin (Stroke Patients Only)       Balance Overall balance assessment: Needs assistance Sitting-balance support: Bilateral upper extremity supported Sitting balance-Leahy Scale: Fair                                      Pertinent Vitals/Pain Pain Assessment: 0-10 Pain Score: 10-Worst pain ever (Patient reported no pain at initiation of session, however with bed mobility reported 10/10 pain in bilateral hips) No pain noted at end of session.  Pain Descriptors / Indicators: Aching Pain Intervention(s): Limited activity within patient's tolerance;Monitored during session;Repositioned    Home Living Family/patient expects to be discharged to::  [facility] with fiance)  Additional Comments: lives on first floor with steps to enter; bilateral hand rails; patient is able to reach both    Prior Function Prior Level of Function : Independent/Modified Independent                     Hand Dominance   Dominant Hand: Right    Extremity/Trunk Assessment   Upper Extremity Assessment Upper Extremity Assessment: Generalized weakness;RUE deficits/detail RUE Deficits / Details: Limited AROM should flexion  bilaterally; was able to reach full range of motion passively bilaterally however patient complained of shoulder pain    Lower Extremity Assessment Lower Extremity Assessment: Generalized weakness;RLE deficits/detail RLE Deficits / Details: limited hip and knee flexion due to bilateral hip pain and weakness       Communication   Communication: No difficulties  Cognition Arousal/Alertness: Lethargic Behavior During Therapy: Flat affect Overall Cognitive Status: Within Functional Limits for tasks assessed                                 General Comments: Patient was aware of her location however not of date (believed it was Friday)        General Comments      Exercises     Assessment/Plan    PT Assessment Patient needs continued PT services  PT Problem List Decreased strength;Decreased range of motion;Decreased activity tolerance;Decreased safety awareness;Decreased mobility;Obesity;Pain       PT Treatment Interventions DME instruction;Balance training;Gait training;Neuromuscular re-education;Stair training;Functional mobility training;Patient/family education;Therapeutic activities;Therapeutic exercise    PT Goals (Current goals can be found in the Care Plan section)  Acute Rehab PT Goals Patient Stated Goal: Wants to go home PT Goal Formulation: With patient Time For Goal Achievement: 11/13/21 Potential to Achieve Goals: Fair    Frequency Min 2X/week   Barriers to discharge        Co-evaluation               AM-PAC PT "6 Clicks" Mobility  Outcome Measure Help needed turning from your back to your side while in a flat bed without using bedrails?: A Little Help needed moving from lying on your back to sitting on the side of a flat bed without using bedrails?: A Little Help needed moving to and from a bed to a chair (including a wheelchair)?: A Lot Help needed standing up from a chair using your arms (e.g., wheelchair or bedside chair)?:  Total Help needed to walk in hospital room?: Total Help needed climbing 3-5 steps with a railing? : Total 6 Click Score: 11    End of Session Equipment Utilized During Treatment: Gait belt Activity Tolerance: Patient limited by fatigue;Patient limited by lethargy;Patient limited by pain Patient left: in bed;with call bell/phone within reach;with bed alarm set   PT Visit Diagnosis: Unsteadiness on feet (R26.81);Pain;Repeated falls (R29.6);Muscle weakness (generalized) (M62.81);History of falling (Z91.81)    Time: 4315-4008 PT Time Calculation (min) (ACUTE ONLY): 30 min   Charges:   PT Evaluation $PT Eval Low Complexity: 1 Low PT Treatments $Therapeutic Activity: 8-22 mins       Angelica Ran, PT  10/30/21. 10:57 AM

## 2021-10-30 NOTE — ED Notes (Signed)
Patient continues to ask for "muscle spasm medications."  States "they are on my medication list.  I take them PRN for my muscle spasms."  When asked where her muscle spasms are currently, patient had a hard time answering.  C/o spasms to bilateral legs at this time.  Patient made aware that MD has been informed of request

## 2021-10-30 NOTE — ED Notes (Signed)
Informed RN bed assigned 

## 2021-10-30 NOTE — Progress Notes (Signed)
Occupational Therapy Evaluation Patient Details Name: Tiffany [name] MRN: [id number] DOB: 1961-08-14 Today's Date: 10/30/2021   History of Present Illness Pt is a 60 y.o. female presenting to hospital 10/30 with increasing weakness, N/V, decreasing p.o. intake, and multiple falls.  Pt seen in ED at [facility] 10/27 and left AMA.  Pt admitted with hypokalemia, refractory N/V, frequent falls, and thrombocytopenia.  PMH includes h/o hiatal hernia, Nissen fundoplication, grade D esophagitis, bipolar 1 disorder, COPD, enlarged heart, and abdominal sx.   Clinical Impression   Tiffany [name] was seen for OT evaluation this date. Prior to hospital admission, pt needed assistance with ADLs and IADLs. Tiffany [name] reports 6 falls in the last week during toilet t/f. Pt lives with fiance and endorses being homeless, currently living in [place]. Currently pt demonstrates impairments as described below (See OT problem list) which functionally limit her ability to perform ADL/self-care tasks. Pt currently requires MOD A for LB access, only able to reach top of socks to pull up. Pt reports unable to stand 2/2 10/10 BLE pain, however with Tiffany [name] encouragement pt stands with MIN A from fiancee HHA (therapist SBA). Pt unable to maintain standing requiring MAX A to return safely to sitting. Pt would benefit from skilled OT services to address noted impairments and functional limitations (see below for any additional details) in order to maximize safety and independence while minimizing falls risk and caregiver burden. Upon hospital discharge, recommend STR to maximize pt safety and return to PLOF.       Recommendations for follow up therapy are one component of a multi-disciplinary discharge planning process, led by the attending physician.  Recommendations may be updated based on patient status, additional functional criteria and insurance authorization.   Follow Up Recommendations  Skilled nursing-short term rehab (<3  hours/day)    Assistance Recommended at Discharge Frequent or constant Supervision/Assistance  Functional Status Assessment  Patient has had a recent decline in their functional status and demonstrates the ability to make significant improvements in function in a reasonable and predictable amount of time.  Equipment Recommendations  [facility]    Recommendations for Other Services       Precautions / Restrictions Precautions Precautions: Fall Restrictions Weight Bearing Restrictions: No      Mobility Bed Mobility Overal bed mobility: Needs Assistance Bed Mobility: Supine to Sit;Sit to Supine     Supine to sit: Min assist Sit to supine: Mod assist        Transfers Overall transfer level: Needs assistance Equipment used: Rolling walker (2 wheels) Transfers: Sit to/from Stand Sit to Stand: Min assist           General transfer comment: Fiance in room was able to guide pt to stand with MIN A + therapist SBA.      Balance Overall balance assessment: Needs assistance Sitting-balance support: Bilateral upper extremity supported Sitting balance-Leahy Scale: Fair     Standing balance support: Bilateral upper extremity supported Standing balance-Leahy Scale: Poor                             ADL either performed or assessed with clinical judgement   ADL Overall ADL's : Needs assistance/impaired                                       General ADL Comments: Pt MOD A for LB access, able to  reach top of socks to pull up. Pt unable to stand/step from elevated EOB, anticipate MAX for BSC t/f.      Pertinent Vitals/Pain Pain Assessment: 0-10 Pain Score: 10-Worst pain ever Pain Location: legs Pain Descriptors / Indicators: Aching;Burning Pain Intervention(s): Patient requesting pain meds-RN notified;Limited activity within patient's tolerance     Hand Dominance Right   Extremity/Trunk Assessment Upper Extremity Assessment Upper Extremity  Assessment: Generalized weakness RUE Deficits / Details: Limited AROM should flexion bilaterally; was able to reach full range of motion passively bilaterally however patient complained of shoulder pain   Lower Extremity Assessment Lower Extremity Assessment: Generalized weakness RLE Deficits / Details: limited hip and knee flexion due to bilateral hip pain and weakness       Communication Communication Communication: No difficulties   Cognition Arousal/Alertness: Lethargic;Awake/alert Behavior During Therapy: Flat affect Overall Cognitive Status: Within Functional Limits for tasks assessed                                 General Comments: A&O x4     General Comments       Exercises Exercises: Other exercises Other Exercises Other Exercises: Pt educ re: OT role, falls pcns, d/c recs Other Exercises: Pt sup<>sit, sit<>stand x2, LBD simulation   Shoulder Instructions      Home Living Family/patient expects to be discharged to:: Other (Comment) (hotel w/ fiance)                                 Additional Comments: lives on first floor with steps to enter; bilateral hand rails; patient is able to reach both, tub bath w/ tub bench, low toilet      Prior Functioning/Environment Prior Level of Function : Needs assist       Physical Assist : ADLs (physical)   ADLs (physical): Grooming;Bathing;Dressing;Toileting;IADLs   ADLs Comments: needed help to shower, get socks on and shirt over head        OT Problem List: Decreased activity tolerance;Decreased strength;Impaired balance (sitting and/or standing);Decreased safety awareness;Decreased knowledge of precautions;Decreased knowledge of use of DME or AE      OT Treatment/Interventions: Self-care/ADL training;Therapeutic exercise;Energy conservation;DME and/or AE instruction;Therapeutic activities    OT Goals(Current goals can be found in the care plan section) Acute Rehab OT Goals Patient  Stated Goal: to get better OT Goal Formulation: With patient Time For Goal Achievement: 11/13/21 Potential to Achieve Goals: Good ADL Goals Pt Will Perform Grooming: with min assist;standing Pt Will Perform Lower Body Dressing: sitting/lateral leans;with min assist Pt Will Transfer to Toilet: with min assist;ambulating;bedside commode  OT Frequency: Min 2X/week   Barriers to D/C:            Co-evaluation              AM-PAC OT "6 Clicks" Daily Activity     Outcome Measure Help from another person eating meals?: None Help from another person taking care of personal grooming?: A Little Help from another person toileting, which includes using toliet, bedpan, or urinal?: A Lot Help from another person bathing (including washing, rinsing, drying)?: A Lot Help from another person to put on and taking off regular upper body clothing?: A Little Help from another person to put on and taking off regular lower body clothing?: A Lot 6 Click Score: 16   End of Session    Activity  Tolerance: Patient limited by pain Patient left: in bed;with call bell/phone within reach;with bed alarm set;with family/visitor present  OT Visit Diagnosis: Unsteadiness on feet (R26.81);Repeated falls (R29.6);History of falling (Z91.81)                Time: 0973-5329 OT Time Calculation (min): 27 min Charges:  OT General Charges $OT Visit: 1 Visit OT Evaluation $OT Eval Low Complexity: 1 Low OT Treatments $Self Care/Home Management : 8-22 mins  [place] [facility], Adine Madura 10/30/2021, 12:12 PM

## 2021-10-30 NOTE — ED Notes (Signed)
Patient states "is it time for my muscle spasm medication?"  Informed patient that medication had not been ordered.  Will let MD know that patient is requesting medication at this time

## 2021-10-30 NOTE — Progress Notes (Signed)
[facility] Progress Note  Patient: Tiffany [name]    [id number]  DOA: 10/29/2021     Date of Service: the patient was seen and examined on 10/30/2021  Chief Complaint  Patient presents with   Weakness   Brief hospital course: Tiffany [name] is a 60 y.o. female with medical history significant for morbid obesity with a BMI of 46, history of GERD status post nissen fundoplication, bipolar disorder, COPD, migraines who presents to the ER via EMS for evaluation of weakness and frequent falls. He states that patient has had nausea and vomiting for weeks and has not been able to tolerate any oral intake.   She was admitted to the hospital at [facility] and had an upper endoscopy in 08/22 which showed diverticulum in the distal esophagus with grade D esophagitis and a small paraesophageal hernia.  She was discharged home in stable condition and placed on twice daily PPI. Patient was seen in the emergency room at [facility] on 10/27 and they left [place]. According to her significant other she has had multiple falls at home and is very weak and so he brought her back to the ER for further evaluation. She is lethargic but arouses to verbal stimuli and denies having any chest pain, no shortness of breath, no abdominal pain, no urinary symptoms, no fever, no chills, no dizziness, no lightheadedness, no headache, no palpitations, no diaphoresis, no leg swelling, no cough, no focal deficits or blurred vision. Venous blood gas 7.49/61/44/20/83.7 Labs show sodium 134, potassium 2.4, chloride 86, bicarb 34, glucose 97, BUN 11, creatinine 0.80, calcium 8.6, anion gap 14, magnesium 1.8, lactic acid 1.2, white count 3.1, hemoglobin 11.5, hematocrit 34.1, MCV 85.3, RDW 13.5, platelet count 97 Respiratory viral panel is negative Twelve-lead EKG reviewed by me shows normal sinus rhythm, prolonged QT       ED Course: Patient is a 60 year old female who presents to the ER for  evaluation of generalized weakness, frequent falls, poor oral intake secondary to nausea and vomiting. Labs reveal evidence of contraction alkalosis with severe hypokalemia. She will be admitted to the hospital for further evaluation.     Assessment and Plan:  Principal Problem:   Hypokalemia Active Problems:   Frequent falls   Refractory nausea and vomiting   Thrombocytopenia (HCC)   GERD (gastroesophageal reflux disease)      Hypokalemia Secondary to GI losses from refractory nausea and vomiting most likely related to patient's known history of GERD Supplement potassium and magnesium      Hypophosphatemia secondary to poor oral intake. Phosphorus repleted.  Refractory nausea and vomiting In a patient with a known history of GERD status post Nissen fundoplication Continue maintenance IV fluid for hydration Continue PPI 40 mg IV twice daily Continue Zofran as needed, Reglan as needed and Phenergan as needed for nausea and vomiting Started soft easy to digest diet GI consulted    Frequent falls Most likely related to volume depletion from refractory nausea and vomiting Place patient on fall precautions PT evaluation once improved D/w patient's PCP who is trying to get MRI spine done, if any indication of a spinal and mobility issue, please order MRI T and L-spine tomorrow a.m.     Thrombocytopenia Unclear etiology No evidence of bleeding at this time Monitor platelet count closely  Anemia secondary to folate deficiency  started oral folate supplement. Iron level is high and B12 within normal range  Vitamin D deficiency, started vitamin D 50000 units p.o. weekly.  Body mass index is 46.56 kg/m.  Interventions:       Diet: Soft/easy to digest DVT Prophylaxis: SCD, pharmacological prophylaxis contraindicated due to thrombocytopenia    Advance goals of care discussion: Full code  Family Communication: family was NOT present at bedside, at the time of  interview.  The pt provided permission to discuss medical plan with the family. Opportunity was given to ask question and all questions were answered satisfactorily.   Disposition:  Pt is from Home, admitted with fall and Low K, still has diff ambulation, lower extremity spasms, high risk for fall, which precludes a safe discharge. Discharge to SNF as per PT and OT eval, when stable.  Subjective: Patient was admitted due to fall and found to have hypokalemia.  Still patient is complaining of bilateral lower extremity spasms, having difficulty ambulation, no any other active issues.  Physical Exam: General:  alert oriented to time, place, and person.  Appear in mild distress, affect appropriate Eyes: PERRLA ENT: Oral Mucosa Clear, moist  Neck: no JVD,  Cardiovascular: S1 and S2 Present, no Murmur,  Respiratory: good respiratory effort, Bilateral Air entry equal and Decreased, no Crackles, no wheezes Abdomen: Bowel Sound present, Soft and no tenderness,  Skin: no rashes Extremities: no Pedal edema, no calf tenderness Neurologic: without any new focal findings Gait not checked due to patient safety concerns  Vitals:   10/30/21 1130 10/30/21 1345 10/30/21 1600 10/30/21 1714  BP: 124/63  122/77 (!) 124/99  Pulse: 85 87 92 90  Resp: 15 15 18    Temp:    98.1 F (36.7 C)  TempSrc:      SpO2: 95% 98% 95% 99%  Weight:      Height:        Intake/Output Summary (Last 24 hours) at 10/30/2021 1827 Last data filed at 10/30/2021 11/01/2021 Gross per 24 hour  Intake 1175.41 ml  Output --  Net 1175.41 ml   Filed Weights   10/29/21 0915  Weight: (!) 143 kg    Data Reviewed: I have personally reviewed and interpreted daily labs, tele strips, imagings as discussed above. I reviewed all nursing notes, pharmacy notes, vitals, pertinent old records I have discussed plan of care as described above with RN and patient/family.  CBC: Recent Labs  Lab 10/26/21 1443 10/29/21 0917 10/30/21 0824   WBC 3.0* 3.1* 3.3*  NEUTROABS 1.5*  --  2.1  HGB 11.9* 11.5* 10.9*  HCT 35.3* 34.1* 33.0*  MCV 84.9 85.3 83.8  PLT 123* 97* 100*   Basic Metabolic Panel: Recent Labs  Lab 10/26/21 1443 10/29/21 0917 10/30/21 0819  NA 133* 134* 135  K 2.6* 2.4* 3.8  CL 87* 86* 94*  CO2 33* 34* 31  GLUCOSE 93 97 91  BUN 11 11 9   CREATININE 0.69 0.80 0.61  CALCIUM 8.8* 8.6* 8.1*  MG  --  1.8 2.1  PHOS  --   --  2.2*    Studies: No results found.  Scheduled Meds:  folic acid  5 mg Oral Daily   phosphorus  500 mg Oral Q4H   Continuous Infusions:  sodium chloride 75 mL/hr at 10/30/21 1127   cefTRIAXone (ROCEPHIN)  IV Stopped (10/30/21 1720)   PRN Meds: acetaminophen **OR** acetaminophen, metoCLOPramide (REGLAN) injection, ondansetron (ZOFRAN) IV, pantoprazole (PROTONIX) IV  Time spent: 35 minutes  Author: 11/01/21. MD [facility] 10/30/2021 6:27 PM  To reach On-call, see care teams to locate the attending and reach out to them via [email or web address]. If 7PM-7AM,  please contact night-coverage If you still have difficulty reaching the attending provider, please page the [facility] (Director on Call) for [facility] on amion for assistance.

## 2021-10-30 NOTE — Progress Notes (Signed)
Wyline Mood , MD [address], [address], [place], [place], [address] [address], [place], [place], [address] Phone: [phone]  Fax: [phone]   COLANDRA OHANIAN is being followed for nausea vomiting.  Day 1 of follow up   Subjective: Feels okay.  Would like to try to eat and drink.  Has not had anything so far.  Denies any abdominal pain.   Objective: Vital signs in last 24 hours: Vitals:   10/30/21 0700 10/30/21 0730 10/30/21 1000 10/30/21 1039  BP: 127/78 129/82 127/81 129/81  Pulse: 90 94 95   Resp: 14 15 14    Temp:      TempSrc:      SpO2: 95% 96% 93%   Weight:      Height:       Weight change:   Intake/Output Summary (Last 24 hours) at 10/30/2021 1323 Last data filed at 10/30/2021 11/01/2021 Gross per 24 hour  Intake 2449.44 ml  Output --  Net 2449.44 ml     Exam:  Abdomen: soft, nontender, normal bowel sounds   Lab Results: @LABTEST2 @ Micro Results: Recent Results (from the past 240 hour(s))  Resp Panel by RT-PCR (Flu A&B, Covid) Nasopharyngeal Swab     Status: None   Collection Time: 10/29/21 11:42 AM   Specimen: Nasopharyngeal Swab; Nasopharyngeal(NP) swabs in vial transport medium  Result Value Ref Range Status   SARS Coronavirus 2 by RT PCR NEGATIVE NEGATIVE Final    Comment: (NOTE) SARS-CoV-2 target nucleic acids are NOT DETECTED.  The SARS-CoV-2 RNA is generally detectable in upper respiratory specimens during the acute phase of infection. The lowest concentration of SARS-CoV-2 viral copies this assay can detect is 138 copies/mL. A negative result does not preclude SARS-Cov-2 infection and should not be used as the sole basis for treatment or other patient management decisions. A negative result may occur with  improper specimen collection/handling, submission of specimen other than nasopharyngeal swab, presence of viral mutation(s) within the areas targeted by this assay, and inadequate number of viral copies(<138 copies/mL).  A negative result must be combined with clinical observations, patient history, and epidemiological information. The expected result is Negative.  Fact Sheet for Patients:   Fact Sheet for Healthcare Providers:  10/31/21  This test is no t yet approved or cleared by the [employer] FDA and  has been authorized for detection and/or diagnosis of SARS-CoV-2 by FDA under an Emergency Use Authorization (EUA). This EUA will remain  in effect (meaning this test can be used) for the duration of the COVID-19 declaration under Section 564(b)(1) of the Act, 21 U.S.C.section 360bbb-3(b)(1), unless the authorization is terminated  or revoked sooner.       Influenza A by PCR NEGATIVE NEGATIVE Final   Influenza B by PCR NEGATIVE NEGATIVE Final    Comment: (NOTE) The Xpert Xpress SARS-CoV-2/FLU/RSV plus assay is intended as an aid in the diagnosis of influenza from Nasopharyngeal swab specimens and should not be used as a sole basis for treatment. Nasal washings and aspirates are unacceptable for Xpert Xpress SARS-CoV-2/FLU/RSV testing.  Fact Sheet for Patients: SeriousBroker.it  Fact Sheet for Healthcare Providers: [place]  This test is not yet approved or cleared by the [employer] FDA and has been authorized for detection and/or diagnosis of SARS-CoV-2 by FDA under an Emergency Use Authorization (EUA). This EUA will remain in effect (meaning this test can be used) for the duration of the COVID-19 declaration under Section 564(b)(1) of the Act, 21 U.S.C.  section 360bbb-3(b)(1), unless the authorization is terminated or revoked.  Performed at [facility], [address] [place], [place] [address]    Studies/Results: CT Head Wo Contrast  Result Date: 10/29/2021 CLINICAL DATA:  Altered mental status EXAM: CT HEAD WITHOUT CONTRAST  TECHNIQUE: Contiguous axial images were obtained from the base of the skull through the vertex without intravenous contrast. COMPARISON:  10/26/2021 FINDINGS: Brain: There is no evidence of recent bleeding within the cranium. Ventricles are not dilated. There is no shift of midline structures. Vascular: Unremarkable Skull: No fracture is seen in calvarium. Sinuses/Orbits: Unremarkable Other: There is low-density in enlarged sella suggesting empty sella. IMPRESSION: No acute intracranial findings are seen. There are no signs of bleeding within the cranium. Empty sella.No significant interval changes are noted since 10/26/2021. Electronically Signed   By: Ernie Avena M.D.   On: 10/29/2021 14:55   CT CERVICAL SPINE WO CONTRAST  Result Date: 10/29/2021 CLINICAL DATA:  Trauma, fall EXAM: CT CERVICAL SPINE WITHOUT CONTRAST TECHNIQUE: Multidetector CT imaging of the cervical spine was performed without intravenous contrast. Multiplanar CT image reconstructions were also generated. COMPARISON:  11/11/2020 FINDINGS: Examination is less than optimal due to the patient's body habitus. Alignment: Alignment of posterior margins of vertebral bodies is unremarkable. There is straightening of lordosis. Skull base and vertebrae: Unremarkable Soft tissues and spinal canal: Degenerative changes are noted with disc space narrowing, bony spurs and facet hypertrophy. There is mild spinal stenosis at C5-C6 and C6-C7 levels. There is encroachment of neural foramina at multiple levels, more so on the left side at C5-C6 level. Prevertebral soft tissues are unremarkable. Disc levels:  As described above Upper chest: Unremarkable Other: None IMPRESSION: No recent fracture is seen. Alignment of posterior margins of the vertebral bodies is unremarkable. Cervical spondylosis. No significant interval changes are noted. Electronically Signed   By: Ernie Avena M.D.   On: 10/29/2021 15:04   Medications: I have reviewed the  patient's current medications. Scheduled Meds:  folic acid  5 mg Oral Daily   phosphorus  500 mg Oral Q4H   Continuous Infusions:  sodium chloride 75 mL/hr at 10/30/21 1127   cefTRIAXone (ROCEPHIN)  IV     PRN Meds:.acetaminophen **OR** acetaminophen, metoCLOPramide (REGLAN) injection, ondansetron (ZOFRAN) IV, pantoprazole (PROTONIX) IV   Assessment: Principal Problem:   Hypokalemia Active Problems:   Frequent falls   Refractory nausea and vomiting   Thrombocytopenia (HCC)   GERD (gastroesophageal reflux disease)  [name] 60 y.o. female was admitted with nausea vomiting and decreased p.o. intake.  Upper endoscopy and also showed grade D esophagitis with an esophageal diverticulum and paraesophageal hernia with history of Nissen fundoplication with intact wrap.  Due to follow-up at [facility] for surgery.  GI be consulted for possible EGD.  Electrolytes were low on admission but have normalized..  Hemoglobin 10.9 g.  This morning BMP potassium 3.8. Presently she feels like she would like to eat and drink.  Suggested trial of oral intake.  If she does well to advance diet if she does not we will perform EGD tomorrow.  If she has vomiting today after food intake and suggest to keep n.p.o. from midnight.  I have informed the patient's nurse in the ER.    LOS: 1 day   Wyline Mood, MD 10/30/2021, 1:23 PM

## 2021-10-30 NOTE — ED Notes (Signed)
Secure msg sent to Ricky Ala., RN for ED to IP SBAR.

## 2021-10-31 DIAGNOSIS — E876 Hypokalemia: Secondary | ICD-10-CM | POA: Diagnosis not present

## 2021-10-31 DIAGNOSIS — K2100 Gastro-esophageal reflux disease with esophagitis, without bleeding: Secondary | ICD-10-CM

## 2021-10-31 LAB — CBC
HCT: 30.8 % — ABNORMAL LOW (ref 36.0–46.0)
Hemoglobin: 10.4 g/dL — ABNORMAL LOW (ref 12.0–15.0)
MCH: 28.6 pg (ref 26.0–34.0)
MCHC: 33.8 g/dL (ref 30.0–36.0)
MCV: 84.6 fL (ref 80.0–100.0)
Platelets: 100 10*3/uL — ABNORMAL LOW (ref 150–400)
RBC: 3.64 MIL/uL — ABNORMAL LOW (ref 3.87–5.11)
RDW: 13.9 % (ref 11.5–15.5)
WBC: 2.8 10*3/uL — ABNORMAL LOW (ref 4.0–10.5)
nRBC: 0 % (ref 0.0–0.2)

## 2021-10-31 LAB — BASIC METABOLIC PANEL
Anion gap: 9 (ref 5–15)
BUN: 10 mg/dL (ref 6–20)
CO2: 31 mmol/L (ref 22–32)
Calcium: 8 mg/dL — ABNORMAL LOW (ref 8.9–10.3)
Chloride: 95 mmol/L — ABNORMAL LOW (ref 98–111)
Creatinine, Ser: 0.62 mg/dL (ref 0.44–1.00)
GFR, Estimated: >60 mL/min (ref >60)
Glucose, Bld: 94 mg/dL (ref 70–99)
Potassium: 2.9 mmol/L — ABNORMAL LOW (ref 3.5–5.1)
Sodium: 135 mmol/L (ref 135–145)

## 2021-10-31 LAB — BLOOD GAS, ARTERIAL
Acid-Base Excess: 7.7 mmol/L — ABNORMAL HIGH (ref 0.0–2.0)
Bicarbonate: 33.6 mmol/L — ABNORMAL HIGH (ref 20.0–28.0)
FIO2: 21
O2 Saturation: 92.9 %
Patient temperature: 37
pCO2 arterial: 53 mmHg — ABNORMAL HIGH (ref 32.0–48.0)
pH, Arterial: 7.41 (ref 7.350–7.450)
pO2, Arterial: 66 mmHg — ABNORMAL LOW (ref 83.0–108.0)

## 2021-10-31 LAB — MAGNESIUM: Magnesium: 1.8 mg/dL (ref 1.7–2.4)

## 2021-10-31 LAB — GLUCOSE, CAPILLARY
Glucose-Capillary: 120 mg/dL — ABNORMAL HIGH (ref 70–99)
Glucose-Capillary: 95 mg/dL (ref 70–99)

## 2021-10-31 LAB — PHOSPHORUS: Phosphorus: 2.5 mg/dL (ref 2.5–4.6)

## 2021-10-31 MED ORDER — POTASSIUM CHLORIDE CRYS ER 20 MEQ PO TBCR
40.0000 meq | EXTENDED_RELEASE_TABLET | ORAL | Status: AC
Start: 2021-10-31 — End: 2021-10-31
  Administered 2021-10-31 (×3): 40 meq via ORAL
  Filled 2021-10-31 (×3): qty 2

## 2021-10-31 MED ORDER — ENOXAPARIN SODIUM 80 MG/0.8ML IJ SOSY
0.5000 mg/kg | PREFILLED_SYRINGE | INTRAMUSCULAR | Status: DC
  Administered 2021-10-31 – 2021-11-02 (×3): 72.5 mg via SUBCUTANEOUS
  Filled 2021-10-31: qty 0.8
  Filled 2021-10-31 (×4): qty 0.72

## 2021-10-31 MED ORDER — MAGNESIUM SULFATE 2 GM/50ML IV SOLN
2.0000 g | Freq: Once | INTRAVENOUS | Status: AC
  Administered 2021-10-31: 2 g via INTRAVENOUS
  Filled 2021-10-31: qty 50

## 2021-10-31 MED ORDER — PERMETHRIN 5 % EX CREA
TOPICAL_CREAM | Freq: Once | CUTANEOUS | Status: DC
  Filled 2021-10-31: qty 60

## 2021-10-31 MED ORDER — HYDROCODONE-ACETAMINOPHEN 5-325 MG PO TABS
1.0000 | ORAL_TABLET | Freq: Four times a day (QID) | ORAL | Status: DC | PRN
  Administered 2021-10-31 – 2021-11-08 (×15): 1 via ORAL
  Filled 2021-10-31 (×15): qty 1

## 2021-10-31 NOTE — Significant Event (Signed)
Rapid Response Event Note   Reason for Call :  Brief unresponsive episode  Initial Focused Assessment:  Rapid response RN arrived in patient's room with patient in bed surrounded by 2A staff and patient's significant other. Per significant other, approximately 3-4 minutes after Dr. Nelson Chimes had left patient's room after her assessment and plan of care review, mid laugh patient went unresponsive. Patient able to state her name and that she is at the hospital, but lethargic and requires physical stimulation to interact with staff. Able to move all extremities, bilateral weakness but nothing focal. Pupils round and reactive to light. CBG 120. Vital signs stable on room air. No difficulty breathing observed or reported by patient. Currently SR on monitor.   Per telemetry staff, patient was NSR for event with possible small run of VT but due to timing most likely thought to be artifact from significant other and 2A staff attempting to arouse patient. Reviewed patient's history with significant other and no history of seizures. Significant other reported while patient was falling at home prior to admission, she was conscious throughout and never unresponsive. Nothing acute noted on head CT 10/29/21. Patient had labs drawn this morning which showed: sodium of 135, potassium of 2.9 (patient already took PO with more scheduled in the future), magnesium 1.8, and calcium at 8.0 (no albumin today but last albumin 3.4 and significant other reports very poor PO intake).   Interventions:  RT drawing ABG.  Plan of Care:  Patient to remain on 2A for now. MD to review ABG when results in and decide further interventions needed beyond continued monitoring. Instructed significant other to contact staff if further events witnessed and encouraged 2A staff to notify rapid response RN if have further needs and results arrive and interventions progress.  Event Summary:   MD Notified: Dr Nelson Chimes Call Time: 12:08 Arrival  Time: 12:10 End Time: 12:32  Bennie Dallas, RN

## 2021-10-31 NOTE — Progress Notes (Signed)
Tiffany [name] , MD [address], [address], [place], [place], [address] [address], [address], [place], [place], [address] Phone: [phone]  Fax: [phone]   Tiffany [name] is being followed for nausea vomiting   Subjective: No vomiting, taking small amounts orally    Objective: Vital signs in last 24 hours: Vitals:   10/30/21 1935 10/30/21 2333 10/31/21 0341 10/31/21 0741  BP: 124/71 140/80 139/75 123/72  Pulse: 93 (!) 103 93 91  Resp: 16 16 18 20   Temp: 98.1 F (36.7 C) 97.8 F (36.6 C) 98.1 F (36.7 C) 98 F (36.7 C)  TempSrc:   Oral   SpO2: 97% (!) 87% 99% 94%  Weight:      Height:       Weight change:   Intake/Output Summary (Last 24 hours) at 10/31/2021 1017 Last data filed at 10/30/2021 1830 Gross per 24 hour  Intake 148.26 ml  Output --  Net 148.26 ml     Exam:  Abdomen: soft, nontender, normal bowel sounds   Lab Results: @LABTEST2 @ Micro Results: Recent Results (from the past 240 hour(s))  Resp Panel by RT-PCR (Flu A&B, Covid) Nasopharyngeal Swab     Status: None   Collection Time: 10/29/21 11:42 AM   Specimen: Nasopharyngeal Swab; Nasopharyngeal(NP) swabs in vial transport medium  Result Value Ref Range Status   SARS Coronavirus 2 by RT PCR NEGATIVE NEGATIVE Final    Comment: (NOTE) SARS-CoV-2 target nucleic acids are NOT DETECTED.  The SARS-CoV-2 RNA is generally detectable in upper respiratory specimens during the acute phase of infection. The lowest concentration of SARS-CoV-2 viral copies this assay can detect is 138 copies/mL. A negative result does not preclude SARS-Cov-2 infection and should not be used as the sole basis for treatment or other patient management decisions. A negative result may occur with  improper specimen collection/handling, submission of specimen other than nasopharyngeal swab, presence of viral mutation(s) within the areas targeted by this assay, and inadequate number of viral copies(<138 copies/mL). A  negative result must be combined with clinical observations, patient history, and epidemiological information. The expected result is Negative.  Fact Sheet for Patients:   Fact Sheet for Healthcare Providers:  10/31/21  This test is no t yet approved or cleared by the [employer] FDA and  has been authorized for detection and/or diagnosis of SARS-CoV-2 by FDA under an Emergency Use Authorization (EUA). This EUA will remain  in effect (meaning this test can be used) for the duration of the COVID-19 declaration under Section 564(b)(1) of the Act, 21 U.S.C.section 360bbb-3(b)(1), unless the authorization is terminated  or revoked sooner.       Influenza A by PCR NEGATIVE NEGATIVE Final   Influenza B by PCR NEGATIVE NEGATIVE Final    Comment: (NOTE) The Xpert Xpress SARS-CoV-2/FLU/RSV plus assay is intended as an aid in the diagnosis of influenza from Nasopharyngeal swab specimens and should not be used as a sole basis for treatment. Nasal washings and aspirates are unacceptable for Xpert Xpress SARS-CoV-2/FLU/RSV testing.  Fact Sheet for Patients: SeriousBroker.it  Fact Sheet for Healthcare Providers: [place]  This test is not yet approved or cleared by the [employer] FDA and has been authorized for detection and/or diagnosis of SARS-CoV-2 by FDA under an Emergency Use Authorization (EUA). This EUA will remain in effect (meaning this test can be used) for the duration of the COVID-19 declaration under Section 564(b)(1) of the Act, 21 U.S.C. section 360bbb-3(b)(1), unless the authorization is terminated or  revoked.  Performed at [facility], [address] [place], [place] [address]    Studies/Results: CT Head Wo Contrast  Result Date: 10/29/2021 CLINICAL DATA:  Altered mental status EXAM: CT HEAD WITHOUT CONTRAST  TECHNIQUE: Contiguous axial images were obtained from the base of the skull through the vertex without intravenous contrast. COMPARISON:  10/26/2021 FINDINGS: Brain: There is no evidence of recent bleeding within the cranium. Ventricles are not dilated. There is no shift of midline structures. Vascular: Unremarkable Skull: No fracture is seen in calvarium. Sinuses/Orbits: Unremarkable Other: There is low-density in enlarged sella suggesting empty sella. IMPRESSION: No acute intracranial findings are seen. There are no signs of bleeding within the cranium. Empty sella.No significant interval changes are noted since 10/26/2021. Electronically Signed   By: Ernie Avena M.D.   On: 10/29/2021 14:55   CT CERVICAL SPINE WO CONTRAST  Result Date: 10/29/2021 CLINICAL DATA:  Trauma, fall EXAM: CT CERVICAL SPINE WITHOUT CONTRAST TECHNIQUE: Multidetector CT imaging of the cervical spine was performed without intravenous contrast. Multiplanar CT image reconstructions were also generated. COMPARISON:  11/11/2020 FINDINGS: Examination is less than optimal due to the patient's body habitus. Alignment: Alignment of posterior margins of vertebral bodies is unremarkable. There is straightening of lordosis. Skull base and vertebrae: Unremarkable Soft tissues and spinal canal: Degenerative changes are noted with disc space narrowing, bony spurs and facet hypertrophy. There is mild spinal stenosis at C5-C6 and C6-C7 levels. There is encroachment of neural foramina at multiple levels, more so on the left side at C5-C6 level. Prevertebral soft tissues are unremarkable. Disc levels:  As described above Upper chest: Unremarkable Other: None IMPRESSION: No recent fracture is seen. Alignment of posterior margins of the vertebral bodies is unremarkable. Cervical spondylosis. No significant interval changes are noted. Electronically Signed   By: Ernie Avena M.D.   On: 10/29/2021 15:04   Medications: I have reviewed the  patient's current medications. Scheduled Meds:  folic acid  5 mg Oral Daily   potassium chloride  40 mEq Oral Q4H   Vitamin D (Ergocalciferol)  50,000 Units Oral Q7 days   Continuous Infusions:  sodium chloride 75 mL/hr at 10/31/21 0433   cefTRIAXone (ROCEPHIN)  IV Stopped (10/30/21 1720)   magnesium sulfate bolus IVPB     PRN Meds:.acetaminophen **OR** acetaminophen, HYDROcodone-acetaminophen, metoCLOPramide (REGLAN) injection, ondansetron (ZOFRAN) IV, pantoprazole (PROTONIX) IV   Assessment: Principal Problem:   Hypokalemia Active Problems:   Frequent falls   Refractory nausea and vomiting   Thrombocytopenia (HCC)   GERD (gastroesophageal reflux disease)   [name] 60 y.o. female was admitted with nausea vomiting and decreased p.o. intake.  Upper endoscopy and also showed grade D esophagitis with an esophageal diverticulum and paraesophageal hernia with history of Nissen fundoplication with intact wrap.  Due to follow-up at [facility] for surgery.  GI was consulted for possible EGD.  Electrolytes were low on admission but have normalized..  Hemoglobin 10.9 g.  Replace low electrolytes, ensure on PPI. Advance diet as tolerated/    LOS: 2 days   Tiffany Mood, MD 10/31/2021, 10:17 AM

## 2021-10-31 NOTE — Progress Notes (Signed)
Pt removed Bipap, stating she will not wear it tonight, will try again tomorrow night.  Carma Lair DNP RN [phone] PM

## 2021-10-31 NOTE — Progress Notes (Signed)
  Chaplain On-Call responded to Rapid Response notification at 1208 hours.  The patient was being attended by the medical team and was not available for a visit.  Chaplain will remain available for support as needed.  Chaplain Evelena Peat M.Div., [facility]

## 2021-10-31 NOTE — NC FL2 (Signed)
[place] MEDICAID FL2 LEVEL OF CARE SCREENING TOOL     IDENTIFICATION  Patient Name: Tiffany [name] Birthdate: September 15, 1961 Sex: female Admission Date (Current Location): 10/29/2021  [facility] and [place] Number:  Chiropodist and Address:  [facility], [address], [place], [place] [address]      Provider Number: [id number]  Attending Physician Name and Address:  Arnetha Courser, MD  Relative Name and Phone Number:       Current Level of Care: Hospital Recommended Level of Care: Skilled Nursing Facility Prior Approval Number:    Date Approved/Denied:   PASRR Number: [id number]  Discharge Plan: SNF    Current Diagnoses: Patient Active Problem List   Diagnosis Date Noted   Hypokalemia 10/29/2021   Frequent falls 10/29/2021   Refractory nausea and vomiting 10/29/2021   Thrombocytopenia (HCC) 10/29/2021   GERD (gastroesophageal reflux disease) 10/29/2021    Orientation RESPIRATION BLADDER Height & Weight     Self, Time, Situation, Place  Normal Incontinent Weight: (!) 315 lb 4.1 oz (143 kg) Height:  5\' 9"  (175.3 cm)  BEHAVIORAL SYMPTOMS/MOOD NEUROLOGICAL BOWEL NUTRITION STATUS      Incontinent Diet (soft diet, thin liquids)  AMBULATORY STATUS COMMUNICATION OF NEEDS Skin   Extensive Assist Verbally Normal                       Personal Care Assistance Level of Assistance  Dressing, Feeding, Bathing Bathing Assistance: Maximum assistance Feeding assistance: Independent Dressing Assistance: Maximum assistance     Functional Limitations Info  Sight, Hearing, Speech Sight Info: Adequate Hearing Info: Adequate Speech Info: Adequate    SPECIAL CARE FACTORS FREQUENCY  PT (By licensed PT), OT (By licensed OT)     PT Frequency: 5x OT Frequency: 5x            Contractures Contractures Info: Not present    Additional Factors Info  Code Status, Allergies, Isolation Precautions Code Status Info: full  code Allergies Info: Dairy Aid (Tilactase), Egg (Eggs Or Egg-derived Products), Milk-related Compounds     Isolation Precautions Info: droplet     Current Medications (10/31/2021):  This is the current hospital active medication list Current Facility-Administered Medications  Medication Dose Route Frequency Provider Last Rate Last Admin   0.9 %  sodium chloride infusion   Intravenous Continuous 13/12/2020, MD 75 mL/hr at 10/31/21 0433 New Bag at 10/31/21 0433   acetaminophen (TYLENOL) tablet 650 mg  650 mg Oral Q6H PRN Agbata, Tochukwu, MD   650 mg at 10/30/21 2212   Or   acetaminophen (TYLENOL) suppository 650 mg  650 mg Rectal Q6H PRN Agbata, Tochukwu, MD       cefTRIAXone (ROCEPHIN) 1 g in sodium chloride 0.9 % 100 mL IVPB  1 g Intravenous Q24H 2213, MD   Stopped at 10/30/21 1720   folic acid (FOLVITE) tablet 5 mg  5 mg Oral Daily 11/01/21, MD   5 mg at 10/31/21 0859   HYDROcodone-acetaminophen (NORCO/VICODIN) 5-325 MG per tablet 1 tablet  1 tablet Oral Q6H PRN 13/01/22, MD   1 tablet at 10/31/21 0900   magnesium sulfate IVPB 2 g 50 mL  2 g Intravenous Once 13/01/22, MD       metoCLOPramide (REGLAN) injection 10 mg  10 mg Intravenous Q6H PRN Arnetha Courser, MD   10 mg at 10/30/21 2159   ondansetron (ZOFRAN) injection 4 mg  4 mg Intravenous Q6H PRN 2160, MD  pantoprazole (PROTONIX) injection 40 mg  40 mg Intravenous Q12H PRN Gillis Santa, MD       potassium chloride SA (KLOR-CON) CR tablet 40 mEq  40 mEq Oral Q4H Arnetha Courser, MD       Vitamin D (Ergocalciferol) (DRISDOL) capsule 50,000 Units  50,000 Units Oral Q7 days Gillis Santa, MD   50,000 Units at 10/30/21 2212     Discharge Medications: Please see discharge summary for a list of discharge medications.  Relevant Imaging Results:  Relevant Lab Results:   Additional Information ssn: [id number]  Reuel Boom Jestina Stephani, LCSW

## 2021-10-31 NOTE — Progress Notes (Signed)
PROGRESS NOTE    Tiffany [name]  QMG:[id number] DOB: May 03, 1961 DOA: 10/29/2021 PCP: Eddie Dibbles, MD   Brief Narrative: Taken from prior notes. Tiffany [name] is a 60 y.o. female with medical history significant for morbid obesity with a BMI of 46, history of GERD status post nissen fundoplication, bipolar disorder, COPD, migraines who presents to the ER via EMS for evaluation of weakness and frequent falls. He states that patient has had nausea and vomiting for weeks and has not been able to tolerate any oral intake.   She was admitted to the hospital at [facility] and had an upper endoscopy in 08/22 which showed diverticulum in the distal esophagus with grade D esophagitis and a small paraesophageal hernia.  She was discharged home in stable condition and placed on twice daily PPI. GI was again consulted during current hospitalization at [facility] and they are recommending continuation of PPI.  She was also found to have hypokalemia, hypophosphatemia and hypomagnesemia, electrolytes are being repleted.  Vitamin D levels were very low at 5.50 and she was started on supplement. UA concerning for UTI, no urine cultures and she was started on ceftriaxone.  PT recommended SNF placement.  11/1.  There was some concern of unresponsiveness which occurred suddenly, rapid response was called.  Vitals and CBG within normal limit.  ABG with mild hypercapnia and hypoxia. Giving a trial of BiPAP as she appears little more somnolent.  Subjective: Patient was seen and examined today.  Significant other at bedside.  She was asking about scabies cream, having skin lesions all over which are pruritic with signs of excoriation.  She was also complaining of burning feet.  No focal deficit.  Later told by nursing staff that rapid response was called as she appears to have been episode of unresponsiveness, no abnormal movements but patient appears little somnolent.  Vitals and CBG within normal  limit.  Assessment & Plan:   Principal Problem:   Hypokalemia Active Problems:   Frequent falls   Refractory nausea and vomiting   Thrombocytopenia (HCC)   GERD (gastroesophageal reflux disease)  Generalized weakness/frequent falls.  Had multiple electrolyte derangements and frequent nausea and vomiting can be contributory.  PT is recommending SNF placement.  No focal deficit.  Strength seems within normal limit. No focal deficit -TOC is working on it.  Increased somnolence/unresponsiveness.  Quite unusual presentation, can be functional but ABG with mildly elevated CO2 and mild hypoxia.  That can be due to hypoventilation syndrome with morbid obesity. -Give her a trial of BiPAP for few hours. -Repeat ABG -She will get benefit from BiPAP at night.  Electrolyte abnormalities.  Most likely secondary to GI losses and poor p.o. intake.  Hypophosphatemia has been resolved, continue to have intermittent hypokalemia and hypomagnesemia. -Replete electrolytes as needed. -Continue to monitor  Anemia secondary to folate deficiency.  Hemoglobin stable, iron and B12 within normal limit. -Continue with supplement  Vitamin D deficiency.  Vitamin D at 5.5.  Can be contributory to frequent falls and weakness. -Continue with supplement-50,000 units weekly for 8 weeks followed by 2000 units daily.  Thrombocytopenia.  Seems chronic and stable. -Continue to monitor  Objective: Vitals:   10/31/21 0341 10/31/21 0741 10/31/21 1102 10/31/21 1211  BP: 139/75 123/72 137/79 124/76  Pulse: 93 91 91 95  Resp: 18 20 17 16   Temp: 98.1 F (36.7 C) 98 F (36.7 C) 98.8 F (37.1 C) 97.8 F (36.6 C)  TempSrc: Oral     SpO2: 99% 94%  98% 96%  Weight:      Height:        Intake/Output Summary (Last 24 hours) at 10/31/2021 1523 Last data filed at 10/31/2021 1100 Gross per 24 hour  Intake 388.26 ml  Output --  Net 388.26 ml   Filed Weights   10/29/21 0915  Weight: (!) 143 kg     Examination:  General exam: Morbidly obese lady, appears calm and comfortable  Respiratory system: Clear to auscultation. Respiratory effort normal. Cardiovascular system: S1 & S2 heard, RRR.  Gastrointestinal system: Soft, nontender, nondistended, bowel sounds positive. Central nervous system: Alert and oriented. No focal neurological deficits. Extremities: No edema, no cyanosis, pulses intact and symmetrical. Psychiatry: Judgement and insight appear normal.   DVT prophylaxis: Lovenox Code Status: Full Family Communication: Neysa Bonito was updated at bedside Disposition Plan:  Status is: Inpatient  Remains inpatient appropriate because: Severity of illness   Level of care: Progressive Cardiac  All the records are reviewed and case discussed with Care Management/Social Worker. Management plans discussed with the patient, nursing and they are in agreement.  Consultants:  GI  Procedures:  Antimicrobials:  Ceftriaxone  Data Reviewed: I have personally reviewed following labs and imaging studies  CBC: Recent Labs  Lab 10/26/21 1443 10/29/21 0917 10/30/21 0824 10/31/21 0448  WBC 3.0* 3.1* 3.3* 2.8*  NEUTROABS 1.5*  --  2.1  --   HGB 11.9* 11.5* 10.9* 10.4*  HCT 35.3* 34.1* 33.0* 30.8*  MCV 84.9 85.3 83.8 84.6  PLT 123* 97* 100* 100*   Basic Metabolic Panel: Recent Labs  Lab 10/26/21 1443 10/29/21 0917 10/30/21 0819 10/31/21 0448  NA 133* 134* 135 135  K 2.6* 2.4* 3.8 2.9*  CL 87* 86* 94* 95*  CO2 33* 34* 31 31  GLUCOSE 93 97 91 94  BUN 11 11 9 10   CREATININE 0.69 0.80 0.61 0.62  CALCIUM 8.8* 8.6* 8.1* 8.0*  MG  --  1.8 2.1 1.8  PHOS  --   --  2.2* 2.5   GFR: Estimated Creatinine Clearance: 117.3 mL/min (by C-G formula based on SCr of 0.62 mg/dL). Liver Function Tests: Recent Labs  Lab 10/26/21 1443  AST 22  ALT 18  ALKPHOS 39  BILITOT 2.3*  PROT 7.2  ALBUMIN 3.4*   No results for input(s): LIPASE, AMYLASE in the last 168 hours. No results for  input(s): AMMONIA in the last 168 hours. Coagulation Profile: No results for input(s): INR, PROTIME in the last 168 hours. Cardiac Enzymes: No results for input(s): CKTOTAL, CKMB, CKMBINDEX, TROPONINI in the last 168 hours. BNP (last 3 results) No results for input(s): PROBNP in the last 8760 hours. HbA1C: No results for input(s): HGBA1C in the last 72 hours. CBG: Recent Labs  Lab 10/30/21 2039 10/31/21 1210  GLUCAP 94 120*   Lipid Profile: No results for input(s): CHOL, HDL, LDLCALC, TRIG, CHOLHDL, LDLDIRECT in the last 72 hours. Thyroid Function Tests: No results for input(s): TSH, T4TOTAL, FREET4, T3FREE, THYROIDAB in the last 72 hours. Anemia Panel: Recent Labs    10/30/21 0824 10/30/21 0825  VITAMINB12  --  704  FOLATE 2.6*  --   TIBC 171*  --   IRON 124  --    Sepsis Labs: Recent Labs  Lab 10/29/21 1142  LATICACIDVEN 1.2    Recent Results (from the past 240 hour(s))  Resp Panel by RT-PCR (Flu A&B, Covid) Nasopharyngeal Swab     Status: None   Collection Time: 10/29/21 11:42 AM   Specimen: Nasopharyngeal Swab;  Nasopharyngeal(NP) swabs in vial transport medium  Result Value Ref Range Status   SARS Coronavirus 2 by RT PCR NEGATIVE NEGATIVE Final    Comment: (NOTE) SARS-CoV-2 target nucleic acids are NOT DETECTED.  The SARS-CoV-2 RNA is generally detectable in upper respiratory specimens during the acute phase of infection. The lowest concentration of SARS-CoV-2 viral copies this assay can detect is 138 copies/mL. A negative result does not preclude SARS-Cov-2 infection and should not be used as the sole basis for treatment or other patient management decisions. A negative result may occur with  improper specimen collection/handling, submission of specimen other than nasopharyngeal swab, presence of viral mutation(s) within the areas targeted by this assay, and inadequate number of viral copies(<138 copies/mL). A negative result must be combined with clinical  observations, patient history, and epidemiological information. The expected result is Negative.  Fact Sheet for Patients:  BloggerCourse.com  Fact Sheet for Healthcare Providers:  SeriousBroker.it  This test is no t yet approved or cleared by the [place] FDA and  has been authorized for detection and/or diagnosis of SARS-CoV-2 by FDA under an Emergency Use Authorization (EUA). This EUA will remain  in effect (meaning this test can be used) for the duration of the COVID-19 declaration under Section 564(b)(1) of the Act, 21 U.S.C.section 360bbb-3(b)(1), unless the authorization is terminated  or revoked sooner.       Influenza A by PCR NEGATIVE NEGATIVE Final   Influenza B by PCR NEGATIVE NEGATIVE Final    Comment: (NOTE) The Xpert Xpress SARS-CoV-2/FLU/RSV plus assay is intended as an aid in the diagnosis of influenza from Nasopharyngeal swab specimens and should not be used as a sole basis for treatment. Nasal washings and aspirates are unacceptable for Xpert Xpress SARS-CoV-2/FLU/RSV testing.  Fact Sheet for Patients: BloggerCourse.com  Fact Sheet for Healthcare Providers: SeriousBroker.it  This test is not yet approved or cleared by the [place] FDA and has been authorized for detection and/or diagnosis of SARS-CoV-2 by FDA under an Emergency Use Authorization (EUA). This EUA will remain in effect (meaning this test can be used) for the duration of the COVID-19 declaration under Section 564(b)(1) of the Act, 21 U.S.C. section 360bbb-3(b)(1), unless the authorization is terminated or revoked.  Performed at [facility], [address], [place], [place] [address]      Radiology Studies: No results found.  Scheduled Meds:  folic acid  5 mg Oral Daily   potassium chloride  40 mEq Oral Q4H   Vitamin D (Ergocalciferol)  50,000 Units Oral Q7 days    Continuous Infusions:  sodium chloride 75 mL/hr at 10/31/21 0433   cefTRIAXone (ROCEPHIN)  IV 1 g (10/31/21 1315)     LOS: 2 days   Time spent: 45 minutes. More than 50% of the time was spent in counseling/coordination of care  Arnetha Courser, MD [facility]  If 7PM-7AM, please contact night-coverage [email or web address]  10/31/2021, 3:23 PM   This record has been created using Conservation officer, historic buildings. Errors have been sought and corrected,but may not always be located. Such creation errors do not reflect on the standard of care.

## 2021-10-31 NOTE — TOC Initial Note (Signed)
Transition of Care [facility] - Initial/Assessment Note    Patient Details  Name: Tiffany [name] MRN: [id number] Date of Birth: 1961/04/26  Transition [facility]) CM/SW Contact:    Maree Krabbe, LCSW Phone Number: 10/31/2021, 9:57 AM  Clinical Narrative: CSW spoke with pt regarding PT recommendation for SNF. Pt is agreeable. Pt has never been to SNF and has no SNF preference. Pt is agreeable to [facility] fax out. CSW will provide bed offers once available.                Expected Discharge Plan: Skilled Nursing Facility Barriers to Discharge: Continued Medical Work up   Patient Goals and CMS Choice Patient states their goals for this hospitalization and ongoing recovery are:: to get better   Choice offered to / list presented to : Patient  Expected Discharge Plan and Services Expected Discharge Plan: Skilled Nursing Facility In-house Referral: NA   Post Acute Care Choice: Skilled Nursing Facility Living arrangements for the past 2 months: Single Family Home                                      Prior Living Arrangements/Services Living arrangements for the past 2 months: Single Family Home Lives with:: Self Patient language and need for interpreter reviewed:: Yes Do you feel safe going back to the place where you live?: Yes      Need for Family Participation in Patient Care: Yes (Comment) Care giver support system in place?: Yes (comment)   Criminal Activity/Legal Involvement Pertinent to Current Situation/Hospitalization: No - Comment as needed  Activities of Daily Living      Permission Sought/Granted   Permission granted to share information with : Yes, Verbal Permission Granted     Permission granted to share info w AGENCY: SNFs in [place]        Emotional Assessment Appearance:: Appears stated age Attitude/Demeanor/Rapport: Engaged Affect (typically observed): Accepting Orientation: : Oriented to Self, Oriented to Place, Oriented to   Time, Oriented to Situation Alcohol / Substance Use: Not Applicable Psych Involvement: No (comment)  Admission diagnosis:  Hypokalemia [E87.6] Inanition (HCC) [R64] Weakness [R53.1] Patient Active Problem List   Diagnosis Date Noted   Hypokalemia 10/29/2021   Frequent falls 10/29/2021   Refractory nausea and vomiting 10/29/2021   Thrombocytopenia (HCC) 10/29/2021   GERD (gastroesophageal reflux disease) 10/29/2021   PCP:  Eddie Dibbles, MD Pharmacy:   [facility] [address], [place] - [address] [place] [address] Phone: [phone] Fax: [phone]  [facility] [address]), [place] - [address] [place]) [place] [address] Phone: [phone] Fax: [phone]     Social Determinants of Health (SDOH) Interventions    Readmission Risk Interventions No flowsheet data found.

## 2021-11-01 ENCOUNTER — Inpatient Hospital Stay: Payer: Medicaid Other

## 2021-11-01 DIAGNOSIS — E876 Hypokalemia: Secondary | ICD-10-CM | POA: Diagnosis not present

## 2021-11-01 LAB — CBC
HCT: 30 % — ABNORMAL LOW (ref 36.0–46.0)
Hemoglobin: 10.2 g/dL — ABNORMAL LOW (ref 12.0–15.0)
MCH: 28.8 pg (ref 26.0–34.0)
MCHC: 34 g/dL (ref 30.0–36.0)
MCV: 84.7 fL (ref 80.0–100.0)
Platelets: 97 10*3/uL — ABNORMAL LOW (ref 150–400)
RBC: 3.54 MIL/uL — ABNORMAL LOW (ref 3.87–5.11)
RDW: 14 % (ref 11.5–15.5)
WBC: 2.4 10*3/uL — ABNORMAL LOW (ref 4.0–10.5)
nRBC: 0 % (ref 0.0–0.2)

## 2021-11-01 LAB — BASIC METABOLIC PANEL
Anion gap: 7 (ref 5–15)
BUN: 9 mg/dL (ref 6–20)
CO2: 32 mmol/L (ref 22–32)
Calcium: 8.1 mg/dL — ABNORMAL LOW (ref 8.9–10.3)
Chloride: 99 mmol/L (ref 98–111)
Creatinine, Ser: 0.72 mg/dL (ref 0.44–1.00)
GFR, Estimated: >60 mL/min (ref >60)
Glucose, Bld: 89 mg/dL (ref 70–99)
Potassium: 3.4 mmol/L — ABNORMAL LOW (ref 3.5–5.1)
Sodium: 138 mmol/L (ref 135–145)

## 2021-11-01 LAB — PATHOLOGIST SMEAR REVIEW

## 2021-11-01 LAB — MAGNESIUM: Magnesium: 2 mg/dL (ref 1.7–2.4)

## 2021-11-01 LAB — LACTATE DEHYDROGENASE: LDH: 114 U/L (ref 98–192)

## 2021-11-01 LAB — PHOSPHORUS: Phosphorus: 2.1 mg/dL — ABNORMAL LOW (ref 2.5–4.6)

## 2021-11-01 MED ORDER — POLYETHYLENE GLYCOL 3350 17 G PO PACK
17.0000 g | PACK | Freq: Two times a day (BID) | ORAL | Status: DC
  Administered 2021-11-01 – 2021-11-07 (×11): 17 g via ORAL
  Filled 2021-11-01 (×11): qty 1

## 2021-11-01 MED ORDER — POTASSIUM CHLORIDE CRYS ER 20 MEQ PO TBCR
20.0000 meq | EXTENDED_RELEASE_TABLET | Freq: Once | ORAL | Status: AC
  Administered 2021-11-01: 20 meq via ORAL
  Filled 2021-11-01: qty 1

## 2021-11-01 MED ORDER — METOCLOPRAMIDE HCL 5 MG PO TABS
10.0000 mg | ORAL_TABLET | Freq: Three times a day (TID) | ORAL | Status: DC
  Administered 2021-11-01 – 2021-11-08 (×21): 10 mg via ORAL
  Filled 2021-11-01 (×8): qty 2
  Filled 2021-11-01: qty 1
  Filled 2021-11-01 (×11): qty 2

## 2021-11-01 MED ORDER — K PHOS MONO-SOD PHOS DI & MONO 155-852-130 MG PO TABS
500.0000 mg | ORAL_TABLET | ORAL | Status: AC
  Administered 2021-11-01 – 2021-11-02 (×4): 500 mg via ORAL
  Filled 2021-11-01 (×5): qty 2

## 2021-11-01 NOTE — TOC Progression Note (Signed)
Transition of Care [facility]) - Progression Note    Patient Details  Name: Tiffany [name] MRN: [id number] Date of Birth: 10/05/1961  [facility]) CM/SW Contact  Chapman Fitch, RN Phone Number: 11/01/2021, 10:42 AM  Clinical Narrative:    No bed offers Patient agreeable to extend bedsearch.  Extended in HUB   Expected Discharge Plan: Skilled Nursing Facility Barriers to Discharge: Continued Medical Work up  Expected Discharge Plan and Services Expected Discharge Plan: Skilled Nursing Facility In-house Referral: NA   Post Acute Care Choice: Skilled Nursing Facility Living arrangements for the past 2 months: Single Family Home                                       Social Determinants of Health (SDOH) Interventions    Readmission Risk Interventions No flowsheet data found.

## 2021-11-01 NOTE — Progress Notes (Signed)
Occupational Therapy Treatment Patient Details Name: Tiffany [name] MRN: [id number] DOB: Mar 27, 1961 Today's Date: 11/01/2021   History of present illness Pt is a 60 y.o. female presenting to hospital 10/30 with increasing weakness, N/V, decreasing p.o. intake, and multiple falls.  Pt seen in ED at [facility] 10/27 and left AMA.  Pt admitted with hypokalemia, refractory N/V, frequent falls, and thrombocytopenia.  PMH includes h/o hiatal hernia, Nissen fundoplication, grade D esophagitis, bipolar 1 disorder, COPD, enlarged heart, and abdominal sx.   OT comments  Tiffany [name] was seen for OT treatment on this date. Upon arrival to room pt reclined in bed with fiancee at bedside, pt eager to get out of bed. Pt requires MIN A to exit R side of bed with good static sitting balance. MOD A + HHA for bed>chair SPT, pt tolerated ~30 sec standing with assist to maintain balance 2/2 posterior lean. Pt and fiancee instructed in importance of HEP and OOB for functional strengthening. Pt making good progress toward goals. Pt continues to benefit from skilled OT services to maximize return to PLOF and minimize risk of future falls, injury, caregiver burden, and readmission. Will continue to follow POC. Discharge recommendation remains appropriate.     Recommendations for follow up therapy are one component of a multi-disciplinary discharge planning process, led by the attending physician.  Recommendations may be updated based on patient status, additional functional criteria and insurance authorization.    Follow Up Recommendations  Skilled nursing-short term rehab (<3 hours/day)    Assistance Recommended at Discharge Frequent or constant Supervision/Assistance  Equipment Recommendations  [facility]    Recommendations for Other Services      Precautions / Restrictions Precautions Precautions: Fall Restrictions Weight Bearing Restrictions: No       Mobility Bed Mobility Overal bed mobility: Needs Assistance Bed  Mobility: Supine to Sit     Supine to sit: Min assist          Transfers Overall transfer level: Needs assistance Equipment used: 1 person hand held assist Transfers: Sit to/from Stand Sit to Stand: Mod assist                 Balance Overall balance assessment: Needs assistance Sitting-balance support: No upper extremity supported;Feet supported Sitting balance-Leahy Scale: Good     Standing balance support: Single extremity supported Standing balance-Leahy Scale: Poor                             ADL either performed or assessed with clinical judgement   ADL Overall ADL's : Needs assistance/impaired                                       General ADL Comments: MOD A + HHA for simulated BSC t/f - pt tolerated ~30 sec standing with assist to maintain balance 2/2 posterior lean.      Cognition Arousal/Alertness: Awake/alert Behavior During Therapy: WFL for tasks assessed/performed Overall Cognitive Status: Within Functional Limits for tasks assessed                                            Exercises Exercises: Other exercises Other Exercises Other Exercises: Pt educ re: HEP, falls pcns, d/c recs Other Exercises: sup>sit, sit<>stand, SPT  Pertinent Vitals/ Pain       Pain Assessment: Faces Faces Pain Scale: Hurts little more Pain Location: legs Pain Descriptors / Indicators: Aching;Burning Pain Intervention(s): Limited activity within patient's tolerance;Repositioned         Frequency  Min 2X/week        Progress Toward Goals  OT Goals(current goals can now be found in the care plan section)  Progress towards OT goals: Progressing toward goals  Acute Rehab OT Goals Patient Stated Goal: to get out of bed OT Goal Formulation: With patient Time For Goal Achievement: 11/13/21 Potential to Achieve Goals: Good ADL Goals Pt Will Perform Grooming: with min assist;standing Pt Will Perform  Lower Body Dressing: sitting/lateral leans;with min assist Pt Will Transfer to Toilet: with min assist;ambulating;bedside commode  Plan Discharge plan remains appropriate;Frequency remains appropriate    Co-evaluation                 AM-PAC OT "6 Clicks" Daily Activity     Outcome Measure   Help from another person eating meals?: None Help from another person taking care of personal grooming?: A Little Help from another person toileting, which includes using toliet, bedpan, or urinal?: A Lot Help from another person bathing (including washing, rinsing, drying)?: A Lot Help from another person to put on and taking off regular upper body clothing?: A Little Help from another person to put on and taking off regular lower body clothing?: A Lot 6 Click Score: 16    End of Session    OT Visit Diagnosis: Unsteadiness on feet (R26.81);Repeated falls (R29.6);History of falling (Z91.81)   Activity Tolerance Patient tolerated treatment well;Patient limited by pain   Patient Left in chair;with call bell/phone within reach;with chair alarm set;with family/visitor present   Nurse Communication Mobility status        Time: 7169-6789 OT Time Calculation (min): 18 min  Charges: OT General Charges $OT Visit: 1 Visit OT Treatments $Self Care/Home Management : 8-22 mins  Kathie Dike, M.S. OTR/L  11/01/21, 11:32 AM  ascom [phone]

## 2021-11-01 NOTE — Progress Notes (Signed)
Chaplain Maggie offered hospitality and social support to family outside pt's room. Continued support available per on call chaplain.

## 2021-11-01 NOTE — Progress Notes (Signed)
Patient was transferring from wheelchair to bed when her legs "gave out" was assisted to the ground complaint of left ankle pain, MD notified, Vitals WDL, used hoyer lift to get patient back to bed. Will monitor   11/01/21 1300  What Happened  Was fall witnessed? Yes  Who witnessed fall? Amin  Patients activity before fall to/from bed, chair, or stretcher  Point of contact buttocks  Was patient injured? No  Follow Up  MD notified Nelson Chimes  Time MD notified 1312  Additional tests No  Progress note created (see row info) Yes  Adult Fall Risk Assessment  Risk Factor Category (scoring not indicated) High fall risk per protocol (document High fall risk)  Patient Fall Risk Level High fall risk  Adult Fall Risk Interventions  Required Bundle Interventions *See Row Information* High fall risk - low, moderate, and high requirements implemented  Additional Interventions PT/OT need assessed if change in mobility from baseline  Screening for Fall Injury Risk (To be completed on HIGH fall risk patients) - Assessing Need for Floor Mats  Risk For Fall Injury- Criteria for Floor Mats Previous fall this admission  Vitals  ECG Heart Rate 100  Pain Assessment  Pain Scale 0-10  Pain Score 8  Pain Type Chronic pain  Pain Location Ankle  Neurological  Neuro (WDL) WDL  Level of Consciousness Alert  Glasgow Coma Scale  Eye Opening 4  Best Verbal Response (NON-intubated) 5  Best Motor Response 6  Glasgow Coma Scale Score 15

## 2021-11-01 NOTE — Progress Notes (Signed)
Mobility Specialist - Progress Note   11/01/21 1437  Mobility  Activity Transferred:  Chair to bed  Level of Assistance Maximum assist, patient does 25-49%  Assistive Device Front wheel walker  Distance Ambulated (ft) 2 ft  Mobility Sit up in bed/chair position for meals  Mobility Response Tolerated well  Mobility performed by Mobility specialist  $Mobility charge 1 Mobility    Pt transferred chair-bed +2-3 assist. Voiced UE weakness, then LE weakness. Pt legs gave out during scoot transfer, assisted to floor and Michiel Sites Lift was used to return to bed. Alarm set.    Filiberto Pinks Mobility Specialist 11/01/21, 3:32 PM

## 2021-11-01 NOTE — Progress Notes (Signed)
PT Cancellation Note  Patient Details Name: [name] MRN: [id number] DOB: 06/05/1961   Cancelled Treatment:    Reason Eval/Treat Not Completed: Fatigue/lethargy limiting ability to participate;Patient's level of consciousness (Chart reviewed, per mobility tech and RN, pt assisted to floor earlier in day due to severe acute onset weakness and inability to participate in transfer WC to bed.) At time of this note, do not see any additional details or documentation. Per RN, MD is aware of [name]/o ankle injury, RN not clear if ankle scan will be pursued. Author attempted to see patient, screen ankle- was able to take vitals, however pt does not wake during time in room. Will attempt treatment again at later date/time.  3:53 PM, 11/01/21 Rosamaria Lints, PT, DPT Physical Therapist - [facility]  [phone] (ASCOM)    Tiffany [name] 11/01/2021, 3:50 PM

## 2021-11-01 NOTE — Progress Notes (Signed)
PROGRESS NOTE    Tiffany [name]  ZOX:[id number] DOB: 09-08-1961 DOA: 10/29/2021 PCP: Eddie Dibbles, MD   Brief Narrative: Taken from prior notes. Tiffany [name] is a 60 y.o. female with medical history significant for morbid obesity with a BMI of 46, history of GERD status post nissen fundoplication, bipolar disorder, COPD, migraines who presents to the ER via EMS for evaluation of weakness and frequent falls. He states that patient has had nausea and vomiting for weeks and has not been able to tolerate any oral intake.   She was admitted to the hospital at [facility] and had an upper endoscopy in 08/22 which showed diverticulum in the distal esophagus with grade D esophagitis and a small paraesophageal hernia.  She was discharged home in stable condition and placed on twice daily PPI. GI was again consulted during current hospitalization at [facility] and they are recommending continuation of PPI.  She was also found to have hypokalemia, hypophosphatemia and hypomagnesemia, electrolytes are being repleted.  Vitamin D levels were very low at 5.50 and she was started on supplement. UA concerning for UTI, no urine cultures and she was started on ceftriaxone.  PT recommended SNF placement.  11/1.  There was some concern of unresponsiveness which occurred suddenly, rapid response was called.  Vitals and CBG within normal limit.  ABG with mild hypercapnia and hypoxia. Giving a trial of BiPAP as she appears little more somnolent.  11/2: Patient was more alert and oriented and sitting in chair.  Having some nausea and vomiting with p.o. intake. Later had an assisted fall to floor due to profound weakness while transferring from chair to bed.  Some complaint of ankle pain but no significant abnormality.  Subjective: Patient was more alert and oriented and sitting in chair.  Significant other and son at bedside. Having some nausea and vomiting with p.o. intake.  Denies any pain.  Last bowel  movement was approximately a week ago.  Stating that she has chronic constipation and does not have a bowel movement in 2 weeks at times.  Assessment & Plan:   Principal Problem:   Hypokalemia Active Problems:   Frequent falls   Refractory nausea and vomiting   Thrombocytopenia (HCC)   GERD (gastroesophageal reflux disease)  Generalized weakness/frequent falls.  Had multiple electrolyte derangements and frequent nausea and vomiting can be contributory.  PT is recommending SNF placement.  No focal deficit.  Strength seems within normal limit. No focal deficit -TOC is working on it.  Increased somnolence/unresponsiveness.  More alert and oriented today  Quite unusual presentation, can be functional but ABG with mildly elevated CO2 and mild hypoxia.  That can be due to hypoventilation syndrome with morbid obesity. -BiPAP was ordered for night but she refused to wear it. -Repeat ABG tomorrow morning -She will get benefit from BiPAP at night.  Assisted fall.  No significant injury, patient was complaining of left ankle pain without any significant abnormality. -Monitor for now-can do imaging if needed  Electrolyte abnormalities.  Most likely secondary to GI losses and poor p.o. intake.  Hypophosphatemia has been resolved, continue to have intermittent hypokalemia and hypomagnesemia. -Replete electrolytes as needed. -Continue to monitor  Anemia secondary to folate deficiency.  Hemoglobin stable, iron and B12 within normal limit. -Continue with supplement  Vitamin D deficiency.  Vitamin D at 5.5.  Can be contributory to frequent falls and weakness. -Continue with supplement-50,000 units weekly for 8 weeks followed by 2000 units daily.  Thrombocytopenia.  Seems chronic and  stable. -Continue to monitor  Objective: Vitals:   11/01/21 1124 11/01/21 1313 11/01/21 1421 11/01/21 1540  BP: (!) 147/86 130/83 137/84 139/89  Pulse: 91 92 88 86  Resp: 17 18 17    Temp: 98.4 F (36.9 C) 97.9  F (36.6 C) 97.9 F (36.6 C)   TempSrc:      SpO2: 90% 94% 92%   Weight:      Height:        Intake/Output Summary (Last 24 hours) at 11/01/2021 1712 Last data filed at 11/01/2021 0600 Gross per 24 hour  Intake 120 ml  Output 550 ml  Net -430 ml    Filed Weights   10/29/21 0915  Weight: (!) 143 kg    Examination:  General.  Morbidly obese lady, in no acute distress. Pulmonary.  Lungs clear bilaterally, normal respiratory effort. CV.  Regular rate and rhythm, no JVD, rub or murmur. Abdomen.  Soft, nontender, nondistended, BS positive. CNS.  Alert and oriented .  No focal neurologic deficit. Extremities.  No edema, no cyanosis, pulses intact and symmetrical. Psychiatry.  Judgment and insight appears normal.   DVT prophylaxis: Lovenox Code Status: Full Family Communication: Significant other and son at bedside. Disposition Plan:  Status is: Inpatient  Remains inpatient appropriate because: Severity of illness   Level of care: Med-Surg  All the records are reviewed and case discussed with Care Management/Social Worker. Management plans discussed with the patient, nursing and they are in agreement.  Consultants:  GI  Procedures:  Antimicrobials:  Ceftriaxone  Data Reviewed: I have personally reviewed following labs and imaging studies  CBC: Recent Labs  Lab 10/26/21 1443 10/29/21 0917 10/30/21 0824 10/31/21 0448 11/01/21 0514  WBC 3.0* 3.1* 3.3* 2.8* 2.4*  NEUTROABS 1.5*  --  2.1  --   --   HGB 11.9* 11.5* 10.9* 10.4* 10.2*  HCT 35.3* 34.1* 33.0* 30.8* 30.0*  MCV 84.9 85.3 83.8 84.6 84.7  PLT 123* 97* 100* 100* 97*    Basic Metabolic Panel: Recent Labs  Lab 10/26/21 1443 10/29/21 0917 10/30/21 0819 10/31/21 0448 11/01/21 0514  NA 133* 134* 135 135 138  K 2.6* 2.4* 3.8 2.9* 3.4*  CL 87* 86* 94* 95* 99  CO2 33* 34* 31 31 32  GLUCOSE 93 97 91 94 89  BUN 11 11 9 10 9   CREATININE 0.69 0.80 0.61 0.62 0.72  CALCIUM 8.8* 8.6* 8.1* 8.0* 8.1*  MG   --  1.8 2.1 1.8 2.0  PHOS  --   --  2.2* 2.5 2.1*    GFR: Estimated Creatinine Clearance: 117.3 mL/min (by C-G formula based on SCr of 0.72 mg/dL). Liver Function Tests: Recent Labs  Lab 10/26/21 1443  AST 22  ALT 18  ALKPHOS 39  BILITOT 2.3*  PROT 7.2  ALBUMIN 3.4*    No results for input(s): LIPASE, AMYLASE in the last 168 hours. No results for input(s): AMMONIA in the last 168 hours. Coagulation Profile: No results for input(s): INR, PROTIME in the last 168 hours. Cardiac Enzymes: No results for input(s): CKTOTAL, CKMB, CKMBINDEX, TROPONINI in the last 168 hours. BNP (last 3 results) No results for input(s): PROBNP in the last 8760 hours. HbA1C: No results for input(s): HGBA1C in the last 72 hours. CBG: Recent Labs  Lab 10/30/21 2039 10/31/21 1210 10/31/21 2024  GLUCAP 94 120* 95    Lipid Profile: No results for input(s): CHOL, HDL, LDLCALC, TRIG, CHOLHDL, LDLDIRECT in the last 72 hours. Thyroid Function Tests: No results for input(s):  TSH, T4TOTAL, FREET4, T3FREE, THYROIDAB in the last 72 hours. Anemia Panel: Recent Labs    10/30/21 0824 10/30/21 0825  VITAMINB12  --  704  FOLATE 2.6*  --   TIBC 171*  --   IRON 124  --     Sepsis Labs: Recent Labs  Lab 10/29/21 1142  LATICACIDVEN 1.2     Recent Results (from the past 240 hour(s))  Resp Panel by RT-PCR (Flu A&B, Covid) Nasopharyngeal Swab     Status: None   Collection Time: 10/29/21 11:42 AM   Specimen: Nasopharyngeal Swab; Nasopharyngeal(NP) swabs in vial transport medium  Result Value Ref Range Status   SARS Coronavirus 2 by RT PCR NEGATIVE NEGATIVE Final    Comment: (NOTE) SARS-CoV-2 target nucleic acids are NOT DETECTED.  The SARS-CoV-2 RNA is generally detectable in upper respiratory specimens during the acute phase of infection. The lowest concentration of SARS-CoV-2 viral copies this assay can detect is 138 copies/mL. A negative result does not preclude SARS-Cov-2 infection and should  not be used as the sole basis for treatment or other patient management decisions. A negative result may occur with  improper specimen collection/handling, submission of specimen other than nasopharyngeal swab, presence of viral mutation(s) within the areas targeted by this assay, and inadequate number of viral copies(<138 copies/mL). A negative result must be combined with clinical observations, patient history, and epidemiological information. The expected result is Negative.  Fact Sheet for Patients:  BloggerCourse.com  Fact Sheet for Healthcare Providers:  SeriousBroker.it  This test is no t yet approved or cleared by the [place] FDA and  has been authorized for detection and/or diagnosis of SARS-CoV-2 by FDA under an Emergency Use Authorization (EUA). This EUA will remain  in effect (meaning this test can be used) for the duration of the COVID-19 declaration under Section 564(b)(1) of the Act, 21 U.S.C.section 360bbb-3(b)(1), unless the authorization is terminated  or revoked sooner.       Influenza A by PCR NEGATIVE NEGATIVE Final   Influenza B by PCR NEGATIVE NEGATIVE Final    Comment: (NOTE) The Xpert Xpress SARS-CoV-2/FLU/RSV plus assay is intended as an aid in the diagnosis of influenza from Nasopharyngeal swab specimens and should not be used as a sole basis for treatment. Nasal washings and aspirates are unacceptable for Xpert Xpress SARS-CoV-2/FLU/RSV testing.  Fact Sheet for Patients: BloggerCourse.com  Fact Sheet for Healthcare Providers: SeriousBroker.it  This test is not yet approved or cleared by the [place] FDA and has been authorized for detection and/or diagnosis of SARS-CoV-2 by FDA under an Emergency Use Authorization (EUA). This EUA will remain in effect (meaning this test can be used) for the duration of the COVID-19 declaration under  Section 564(b)(1) of the Act, 21 U.S.C. section 360bbb-3(b)(1), unless the authorization is terminated or revoked.  Performed at [facility], [address] [place], [place] [address]       Radiology Studies: No results found.  Scheduled Meds:  enoxaparin (LOVENOX) injection  0.5 mg/kg Subcutaneous Q24H   folic acid  5 mg Oral Daily   metoCLOPramide  10 mg Oral TID AC   permethrin   Topical Once   phosphorus  500 mg Oral Q4H   polyethylene glycol  17 g Oral BID   Vitamin D (Ergocalciferol)  50,000 Units Oral Q7 days   Continuous Infusions:  sodium chloride Stopped (11/01/21 1219)   cefTRIAXone (ROCEPHIN)  IV 1 g (11/01/21 1157)     LOS: 3 days   Time  spent: 40 minutes. More than 50% of the time was spent in counseling/coordination of care  Arnetha Courser, MD [facility]  If 7PM-7AM, please contact night-coverage [email or web address]  11/01/2021, 5:12 PM   This record has been created using Conservation officer, historic buildings. Errors have been sought and corrected,but may not always be located. Such creation errors do not reflect on the standard of care.

## 2021-11-02 DIAGNOSIS — E876 Hypokalemia: Secondary | ICD-10-CM | POA: Diagnosis not present

## 2021-11-02 LAB — CBC
HCT: 31.7 % — ABNORMAL LOW (ref 36.0–46.0)
Hemoglobin: 10.1 g/dL — ABNORMAL LOW (ref 12.0–15.0)
MCH: 26.9 pg (ref 26.0–34.0)
MCHC: 31.9 g/dL (ref 30.0–36.0)
MCV: 84.3 fL (ref 80.0–100.0)
Platelets: 98 10*3/uL — ABNORMAL LOW (ref 150–400)
RBC: 3.76 MIL/uL — ABNORMAL LOW (ref 3.87–5.11)
RDW: 14.2 % (ref 11.5–15.5)
WBC: 3.1 10*3/uL — ABNORMAL LOW (ref 4.0–10.5)
nRBC: 0 % (ref 0.0–0.2)

## 2021-11-02 LAB — BASIC METABOLIC PANEL
Anion gap: 10 (ref 5–15)
BUN: 11 mg/dL (ref 6–20)
CO2: 30 mmol/L (ref 22–32)
Calcium: 8.3 mg/dL — ABNORMAL LOW (ref 8.9–10.3)
Chloride: 97 mmol/L — ABNORMAL LOW (ref 98–111)
Creatinine, Ser: 0.59 mg/dL (ref 0.44–1.00)
GFR, Estimated: >60 mL/min (ref >60)
Glucose, Bld: 79 mg/dL (ref 70–99)
Potassium: 3.4 mmol/L — ABNORMAL LOW (ref 3.5–5.1)
Sodium: 137 mmol/L (ref 135–145)

## 2021-11-02 LAB — MAGNESIUM: Magnesium: 1.7 mg/dL (ref 1.7–2.4)

## 2021-11-02 LAB — GLUCOSE, CAPILLARY: Glucose-Capillary: 80 mg/dL (ref 70–99)

## 2021-11-02 LAB — PHOSPHORUS: Phosphorus: 3.9 mg/dL (ref 2.5–4.6)

## 2021-11-02 LAB — ANA W/REFLEX IF POSITIVE: Anti Nuclear Antibody (ANA): NEGATIVE

## 2021-11-02 LAB — HAPTOGLOBIN: Haptoglobin: 193 mg/dL (ref 33–346)

## 2021-11-02 MED ORDER — ADULT MULTIVITAMIN W/MINERALS CH
1.0000 | ORAL_TABLET | Freq: Every day | ORAL | Status: DC
  Administered 2021-11-03 – 2021-11-08 (×6): 1 via ORAL
  Filled 2021-11-02 (×6): qty 1

## 2021-11-02 MED ORDER — MAGNESIUM SULFATE 2 GM/50ML IV SOLN
2.0000 g | Freq: Once | INTRAVENOUS | Status: AC
  Administered 2021-11-02: 09:00:00 2 g via INTRAVENOUS
  Filled 2021-11-02: qty 50

## 2021-11-02 MED ORDER — KATE FARMS STANDARD 1.4 PO LIQD
325.0000 mL | Freq: Three times a day (TID) | ORAL | Status: DC
  Administered 2021-11-02 – 2021-11-08 (×14): 325 mL via ORAL
  Filled 2021-11-02: qty 325

## 2021-11-02 MED ORDER — DIPHENHYDRAMINE HCL 25 MG PO CAPS
25.0000 mg | ORAL_CAPSULE | ORAL | Status: DC | PRN
  Administered 2021-11-02 – 2021-11-08 (×4): 25 mg via ORAL
  Filled 2021-11-02 (×4): qty 1

## 2021-11-02 MED ORDER — POTASSIUM CHLORIDE CRYS ER 20 MEQ PO TBCR
40.0000 meq | EXTENDED_RELEASE_TABLET | Freq: Once | ORAL | Status: AC
  Administered 2021-11-02: 09:00:00 40 meq via ORAL
  Filled 2021-11-02: qty 2

## 2021-11-02 NOTE — Progress Notes (Signed)
Initial Nutrition Assessment  DOCUMENTATION CODES:   Morbid obesity  INTERVENTION:   [employer] 1.0 supplement TID- provides 325kcal and 16g protein per serving   MVI po daily   Pt at high refeed risk; recommend monitor potassium, magnesium and phosphorus labs daily until stable  NUTRITION DIAGNOSIS:   Inadequate oral intake related to acute illness as evidenced by per patient/family report.  GOAL:   Patient will meet greater than or equal to 90% of their needs  MONITOR:   PO intake, Supplement acceptance, Labs, Weight trends, Skin, I & O's  REASON FOR ASSESSMENT:   Consult Assessment of nutrition requirement/status  ASSESSMENT:   60 y/o female with h/o bipolar disorder, COPD, GERD, hiatal hernia and s/p nissen fundoplication (80'H) who is admitted with weakness and falls.  Met with pt in room today. Pt reports poor appetite and oral intake since 10/18 r/t nausea. Pt reports regurgitation/vomiting of food after she eats. Pt reports that she feels as though the food gets stuck at her hernia and then comes back up. Pt currently eating only sips and bites of meals. Pt did not eat much of her lunch today. Pt reports that she does not drink supplements at home as she is allergic to milk products; pt reports that they worsen her asthma. Per chart, pt is down 25lbs(7%) over the past month; this is significant weight loss. RD discussed with pt the importance of adequate nutrition needed to preserve lean muscle. Pt is willing to drink non-milk based supplements; RD will add [employer]. Pt is at high refeed risk. Of note, pt reports chronic constipation and reports that she sometimes goes weeks without having a bowel movement.   Medications reviewed and include: lovenox, folic acid, reglan, miralax, vitamin D, NaCl @75ml /hr, ceftriaxone   Labs reviewed: K 3.4(L), P 3.9 wnl, Mg 1.7 wnl B12- 704- 10/31 Wbc 3.1(L), Hgb 10.1(L), Hct 31.7(L)  NUTRITION - FOCUSED PHYSICAL  EXAM:  Flowsheet Row Most Recent Value  Orbital Region No depletion  Upper Arm Region No depletion  Thoracic and Lumbar Region No depletion  Buccal Region No depletion  Temple Region No depletion  Clavicle Bone Region No depletion  Clavicle and Acromion Bone Region No depletion  Scapular Bone Region No depletion  Dorsal Hand No depletion  Patellar Region No depletion  Anterior Thigh Region No depletion  Posterior Calf Region No depletion  Edema (RD Assessment) None  Hair Reviewed  Eyes Reviewed  Mouth Reviewed  Skin Reviewed  Nails Reviewed   Diet Order:   Diet Order             DIET SOFT Room service appropriate? Yes; Fluid consistency: Thin  Diet effective now                  EDUCATION NEEDS:   Education needs have been addressed  Skin:  Skin Assessment: Reviewed RN Assessment  Last BM:  10/31  Height:   Ht Readings from Last 1 Encounters:  10/29/21 5' 9"  (1.753 m)    Weight:   Wt Readings from Last 1 Encounters:  11/02/21 131.3 kg    Ideal Body Weight:  65.9 kg  BMI:  Body mass index is 42.75 kg/m.  Estimated Nutritional Needs:   Kcal:  2500-2800kcal/day  Protein:  >125g/day  Fluid:  2.0-2.3L/day  Koleen Distance MS, RD, LDN Please refer to [facility] for RD and/or RD on-call/weekend/after hours pager

## 2021-11-02 NOTE — Progress Notes (Signed)
   11/02/21 0900  Clinical Encounter Type  Visited With Patient  Visit Type Initial;Spiritual support  Referral From Nurse  Consult/Referral To Chaplain  Spiritual Encounters  Spiritual Needs Prayer;Emotional  Chaplain Arleigh Odowd responded to a RR in room 116  A, pt [name]. Medical team assessing the pt. Pt was alert and sitting up in bed when I arrived. I provided a ministry of presence and spiritual support. No family members were present.

## 2021-11-02 NOTE — Significant Event (Signed)
Rapid Response Event Note   Reason for Call :  Unresponsive episode. This rapid response nurse actually saw patient on 11/01 for similar episode.   Initial Focused Assessment:  Rapid response RN arrived in patient's room with patient surrounded by 1C staff. Per report from staff patient had brief unresponsive episode while trying to get patient sitting on the side of the bed to eat breakfast and was lethargic afterwards. At time of rapid response patient was lethargic but able to answer questions and stated her name and that she remembered feeling dizzy before going unresponsive. Patient was not on cardiac monitoring at time of incident but post incident while she was lying bed she was in SR in the 90s, BP 152/86 MAP 106, RR mid teens and unlabored. No reported shortness of breath. Only complaint was that fingertips felt cold. Hands felt warm to multiple staff. CBG 80. Dr. Nelson Chimes arrived shortly after rapid response team and performed her own assessment of patient. Patient reported she did not wear bipap last night because when respiratory therapy came by she had vomited. Currently patient is on scheduled reglan and has prn zofran which she did receive last night.   Interventions:  Assisted in scooting patient up in bed to allow for optimal breathing and PO intake.  Plan of Care:  Patient to remain on 1C for now. 1C staff placed patient on cardiac monitoring box and continuous pulse ox monitoring. Dr. Nelson Chimes emphasized the importance of wearing bipap when patient is sleeping and encouraged PO intake to help maintain nutrition to help CBGs and vitamin, electrolyte, and mineral levels. Dr. Nelson Chimes confirmed patient had BM yesterday to help with constipation and has been receiving scheduled reglan. Dr. Nelson Chimes ordered orthostatic vital sign check for patient.  Event Summary:   MD Notified: Dr. Nelson Chimes Call Time: 08:36 Arrival Time: 08:38 End Time: 09:05  Bennie Dallas, RN

## 2021-11-02 NOTE — Progress Notes (Signed)
PT Cancellation Note  Patient Details Name: Tiffany [name] MRN: [id number] DOB: 10-22-1961   Cancelled Treatment:    Reason Eval/Treat Not Completed: Other (comment) Rapid response called at 0836. OT to hold treatment this morning and will re-attempt when pt is medically able to participate.   Olga Coaster PT, DPT 9:09 AM,11/02/21

## 2021-11-02 NOTE — TOC Progression Note (Signed)
Transition of Care [facility]) - Progression Note    Patient Details  Name: Tiffany [name] MRN: [id number] Date of Birth: 1961/01/03  [facility] CM/SW Contact  Shelbie Hutching, RN Phone Number: 11/02/2021, 2:46 PM  Clinical Narrative:    RNCM met with patient at the bedside to discuss bed offers.  Patient has a choice between [place] and [place], both have offered at bed.  Patient's significant other and brother at the bedside today, they all agree that [facility] would be a little bit closer and more convenient.  Ebony Hail with [facility] notified of acceptance and they will start insurance authorization.    Expected Discharge Plan: [place] Barriers to Discharge: Continued Medical Work up  Expected Discharge Plan and Services Expected Discharge Plan: [place] In-house Referral: NA   Post Acute Care Choice: [place] Living arrangements for the past 2 months: Single Family Home                                       Social Determinants of Health (SDOH) Interventions    Readmission Risk Interventions No flowsheet data found.

## 2021-11-02 NOTE — Progress Notes (Signed)
OT Cancellation Note  Patient Details Name: ZULEICA SEITH MRN: [id number] DOB: [birth year]   Cancelled Treatment:    Reason Eval/Treat Not Completed: Other (comment). Rapid response called at 0836. OT to hold treatment this morning and will re-attempt when pt is medically able to participate.   Jackquline Denmark, MS, OTR/L , CBIS ascom [phone]  11/02/21, 8:48 AM

## 2021-11-02 NOTE — Progress Notes (Addendum)
Pt requested to sleep in the  recliner-chair at this time. Will continue to monitor.  Update 0500: pt requested to get back in bed. Took about 5 RN staff members to help pt get back in the bed with hoyer lift. Will notify incoming shift. Will continue to monitor.

## 2021-11-02 NOTE — Plan of Care (Signed)
  Problem: Education: Goal: Knowledge of General Education information will improve Description: Including pain rating scale, medication(s)/side effects and non-pharmacologic comfort measures Outcome: Progressing   Problem: Elimination: Goal: Will not experience complications related to urinary retention Outcome: Progressing   Problem: Pain Managment: Goal: General experience of comfort will improve Outcome: Progressing   Problem: Safety: Goal: Ability to remain free from injury will improve Outcome: Progressing   

## 2021-11-02 NOTE — Progress Notes (Signed)
Attempted to place patient on CPAP/BIPAP. Patient states she has been vomiting; therefore, this is contraindicated. Hospital unit is at the bedside.

## 2021-11-02 NOTE — Progress Notes (Addendum)
Pt is complaining of itching MD Manys made.aware. Will continue to monitor.  Update  0207: MD Mansy ordered benadryl 25 mg capsule every 4 PRN for itching. Will continue to monitor.

## 2021-11-02 NOTE — Progress Notes (Signed)
PROGRESS NOTE    Tiffany [name]  RXY:[id number] DOB: 1961-03-09 DOA: 10/29/2021 PCP: Eddie Dibbles, MD   Brief Narrative: Taken from prior notes. Tiffany [name] is a 60 y.o. female with medical history significant for morbid obesity with a BMI of 46, history of GERD status post nissen fundoplication, bipolar disorder, COPD, migraines who presents to the ER via EMS for evaluation of weakness and frequent falls. He states that patient has had nausea and vomiting for weeks and has not been able to tolerate any oral intake.   She was admitted to the hospital at [facility] and had an upper endoscopy in 08/22 which showed diverticulum in the distal esophagus with grade D esophagitis and a small paraesophageal hernia.  She was discharged home in stable condition and placed on twice daily PPI. GI was again consulted during current hospitalization at [facility] and they are recommending continuation of PPI.  She was also found to have hypokalemia, hypophosphatemia and hypomagnesemia, electrolytes are being repleted.  Vitamin D levels were very low at 5.50 and she was started on supplement. UA concerning for UTI, no urine cultures and she was started on ceftriaxone.  PT recommended SNF placement.  11/1.  There was some concern of unresponsiveness which occurred suddenly, rapid response was called.  Vitals and CBG within normal limit.  ABG with mild hypercapnia and hypoxia. Giving a trial of BiPAP as she appears little more somnolent.  11/2: Patient was more alert and oriented and sitting in chair.  Having some nausea and vomiting with p.o. intake. Later had an assisted fall to floor due to profound weakness while transferring from chair to bed.  Some complaint of ankle pain but no significant abnormality.  11/3: Rapid response was again called earlier due to decreased responsiveness.  Vitals stable, CBG 80.  ABG with CO2 of 54 and O2 of 66.  Patient again refused to wear BiPAP at night. Another  discussion that why it is important and if she continued to refuse then we might not be able to help her, most likely she is having hypoventilation syndrome due to morbid obesity.  Subjective: Patient was seen with rapid response during morning rounds.  She was having a little slow response and appears stable.  Had another discussion about the importance of using BiPAP at night.  Assessment & Plan:   Principal Problem:   Hypokalemia Active Problems:   Frequent falls   Refractory nausea and vomiting   Thrombocytopenia (HCC)   GERD (gastroesophageal reflux disease)  Generalized weakness/frequent falls.  Had multiple electrolyte derangements and frequent nausea and vomiting can be contributory.  PT is recommending SNF placement.  No focal deficit.  Strength seems within normal limit. No focal deficit -TOC is working on it.  Increased somnolence/unresponsiveness.  Quite unusual presentation, can be functional but ABG with mildly elevated CO2 and mild hypoxia.  That can be due to hypoventilation syndrome with morbid obesity.  Frequent calling of rapid response with decreased responsiveness, doubt it is medically significant.  Patient continued to refuse BiPAP at night despite explaining multiple times.  Most likely underlying psych issues. -Continue with BiPAP at night if she wears it.  Assisted fall.  No significant injury, patient was complaining of left ankle pain without any significant abnormality.  Left ankle and knee imaging was done due to her complaint of pain and it was negative for any acute abnormality.  Electrolyte abnormalities.  Most likely secondary to GI losses and poor p.o. intake.  Hypophosphatemia has  been resolved, continue to have intermittent hypokalemia and hypomagnesemia. -Replete electrolytes as needed. -Continue to monitor  Anemia secondary to folate deficiency.  Hemoglobin stable, iron and B12 within normal limit. -Continue with supplement  Vitamin D deficiency.   Vitamin D at 5.5.  Can be contributory to frequent falls and weakness. -Continue with supplement-50,000 units weekly for 8 weeks followed by 2000 units daily.  Thrombocytopenia.  Seems chronic and stable. -Continue to monitor  Class III/morbid obesity. Estimated body mass index is 42.75 kg/m as calculated from the following:   Height as of this encounter: 5\' 9"  (1.753 m).   Weight as of this encounter: 131.3 kg.   Objective: Vitals:   11/02/21 0530 11/02/21 0835 11/02/21 1157 11/02/21 1336  BP: (!) 147/86 (!) 152/86 138/74   Pulse: 97 90 97   Resp: 18 18 18    Temp: 98.2 F (36.8 C) 99 F (37.2 C) 98.5 F (36.9 C)   TempSrc:   Oral   SpO2: 95% 96% 91%   Weight:    131.3 kg  Height:        Intake/Output Summary (Last 24 hours) at 11/02/2021 1557 Last data filed at 11/02/2021 1522 Gross per 24 hour  Intake 240 ml  Output 850 ml  Net -610 ml    Filed Weights   10/29/21 0915 11/02/21 1336  Weight: (!) 143 kg 131.3 kg    Examination: General.  Morbidly obese lady, in no acute distress. Pulmonary.  Lungs clear bilaterally, normal respiratory effort. CV.  Regular rate and rhythm, no JVD, rub or murmur. Abdomen.  Soft, nontender, nondistended, BS positive. CNS.  Alert and oriented .  No focal neurologic deficit. Extremities.  No edema, no cyanosis, pulses intact and symmetrical. Psychiatry.  Appears to have low intelligence.  DVT prophylaxis: Lovenox Code Status: Full Family Communication:  Disposition Plan:  Status is: Inpatient  Remains inpatient appropriate because: Severity of illness   Level of care: Med-Surg  All the records are reviewed and case discussed with Care Management/Social Worker. Management plans discussed with the patient, nursing and they are in agreement.  Consultants:  GI  Procedures:  Antimicrobials:  Ceftriaxone  Data Reviewed: I have personally reviewed following labs and imaging studies  CBC: Recent Labs  Lab 10/29/21 0917  10/30/21 0824 10/31/21 0448 11/01/21 0514 11/02/21 0506  WBC 3.1* 3.3* 2.8* 2.4* 3.1*  NEUTROABS  --  2.1  --   --   --   HGB 11.5* 10.9* 10.4* 10.2* 10.1*  HCT 34.1* 33.0* 30.8* 30.0* 31.7*  MCV 85.3 83.8 84.6 84.7 84.3  PLT 97* 100* 100* 97* 98*    Basic Metabolic Panel: Recent Labs  Lab 10/29/21 0917 10/30/21 0819 10/31/21 0448 11/01/21 0514 11/02/21 0506  NA 134* 135 135 138 137  K 2.4* 3.8 2.9* 3.4* 3.4*  CL 86* 94* 95* 99 97*  CO2 34* 31 31 32 30  GLUCOSE 97 91 94 89 79  BUN 11 9 10 9 11   CREATININE 0.80 0.61 0.62 0.72 0.59  CALCIUM 8.6* 8.1* 8.0* 8.1* 8.3*  MG 1.8 2.1 1.8 2.0 1.7  PHOS  --  2.2* 2.5 2.1* 3.9    GFR: Estimated Creatinine Clearance: 111.6 mL/min (by C-G formula based on SCr of 0.59 mg/dL). Liver Function Tests: No results for input(s): AST, ALT, ALKPHOS, BILITOT, PROT, ALBUMIN in the last 168 hours.  No results for input(s): LIPASE, AMYLASE in the last 168 hours. No results for input(s): AMMONIA in the last 168 hours. Coagulation Profile:  No results for input(s): INR, PROTIME in the last 168 hours. Cardiac Enzymes: No results for input(s): CKTOTAL, CKMB, CKMBINDEX, TROPONINI in the last 168 hours. BNP (last 3 results) No results for input(s): PROBNP in the last 8760 hours. HbA1C: No results for input(s): HGBA1C in the last 72 hours. CBG: Recent Labs  Lab 10/30/21 2039 10/31/21 1210 10/31/21 2024 11/02/21 0838  GLUCAP 94 120* 95 80    Lipid Profile: No results for input(s): CHOL, HDL, LDLCALC, TRIG, CHOLHDL, LDLDIRECT in the last 72 hours. Thyroid Function Tests: No results for input(s): TSH, T4TOTAL, FREET4, T3FREE, THYROIDAB in the last 72 hours. Anemia Panel: No results for input(s): VITAMINB12, FOLATE, FERRITIN, TIBC, IRON, RETICCTPCT in the last 72 hours.  Sepsis Labs: Recent Labs  Lab 10/29/21 1142  LATICACIDVEN 1.2     Recent Results (from the past 240 hour(s))  Resp Panel by RT-PCR (Flu A&B, Covid) Nasopharyngeal  Swab     Status: None   Collection Time: 10/29/21 11:42 AM   Specimen: Nasopharyngeal Swab; Nasopharyngeal(NP) swabs in vial transport medium  Result Value Ref Range Status   SARS Coronavirus 2 by RT PCR NEGATIVE NEGATIVE Final    Comment: (NOTE) SARS-CoV-2 target nucleic acids are NOT DETECTED.  The SARS-CoV-2 RNA is generally detectable in upper respiratory specimens during the acute phase of infection. The lowest concentration of SARS-CoV-2 viral copies this assay can detect is 138 copies/mL. A negative result does not preclude SARS-Cov-2 infection and should not be used as the sole basis for treatment or other patient management decisions. A negative result may occur with  improper specimen collection/handling, submission of specimen other than nasopharyngeal swab, presence of viral mutation(s) within the areas targeted by this assay, and inadequate number of viral copies(<138 copies/mL). A negative result must be combined with clinical observations, patient history, and epidemiological information. The expected result is Negative.  Fact Sheet for Patients:  BloggerCourse.com  Fact Sheet for Healthcare Providers:  SeriousBroker.it  This test is no t yet approved or cleared by the [place] FDA and  has been authorized for detection and/or diagnosis of SARS-CoV-2 by FDA under an Emergency Use Authorization (EUA). This EUA will remain  in effect (meaning this test can be used) for the duration of the COVID-19 declaration under Section 564(b)(1) of the Act, 21 U.S.C.section 360bbb-3(b)(1), unless the authorization is terminated  or revoked sooner.       Influenza A by PCR NEGATIVE NEGATIVE Final   Influenza B by PCR NEGATIVE NEGATIVE Final    Comment: (NOTE) The Xpert Xpress SARS-CoV-2/FLU/RSV plus assay is intended as an aid in the diagnosis of influenza from Nasopharyngeal swab specimens and should not be used as a sole  basis for treatment. Nasal washings and aspirates are unacceptable for Xpert Xpress SARS-CoV-2/FLU/RSV testing.  Fact Sheet for Patients: BloggerCourse.com  Fact Sheet for Healthcare Providers: SeriousBroker.it  This test is not yet approved or cleared by the [place] FDA and has been authorized for detection and/or diagnosis of SARS-CoV-2 by FDA under an Emergency Use Authorization (EUA). This EUA will remain in effect (meaning this test can be used) for the duration of the COVID-19 declaration under Section 564(b)(1) of the Act, 21 U.S.C. section 360bbb-3(b)(1), unless the authorization is terminated or revoked.  Performed at [facility], [address] [place], [place] [address]       Radiology Studies: DG Knee 1-2 Views Left  Result Date: 11/01/2021 CLINICAL DATA:  Status post fall. EXAM: LEFT KNEE - 1-2 VIEW  COMPARISON:  None. FINDINGS: No evidence of fracture, dislocation, or joint effusion. No evidence of arthropathy or other focal bone abnormality. Soft tissues are unremarkable. IMPRESSION: Negative. Electronically Signed   By: Aram Candela M.D.   On: 11/01/2021 19:56   DG Ankle 2 Views Left  Result Date: 11/01/2021 CLINICAL DATA:  Status post fall. EXAM: LEFT ANKLE - 2 VIEW COMPARISON:  None. FINDINGS: There is no evidence of fracture, dislocation, or joint effusion. There is no evidence of arthropathy or other focal bone abnormality. Mild diffuse soft tissue swelling is seen. IMPRESSION: Mild diffuse soft tissue swelling without an acute osseous abnormality. Electronically Signed   By: Aram Candela M.D.   On: 11/01/2021 20:00    Scheduled Meds:  enoxaparin (LOVENOX) injection  0.5 mg/kg Subcutaneous Q24H   feeding supplement (KATE FARMS STANDARD 1.4)  325 mL Oral TID BM   folic acid  5 mg Oral Daily   metoCLOPramide  10 mg Oral TID AC   [START ON 11/03/2021] multivitamin with minerals  1 tablet  Oral Daily   permethrin   Topical Once   polyethylene glycol  17 g Oral BID   Vitamin D (Ergocalciferol)  50,000 Units Oral Q7 days   Continuous Infusions:  sodium chloride 75 mL/hr at 11/02/21 0836   cefTRIAXone (ROCEPHIN)  IV 1 g (11/02/21 1328)     LOS: 4 days   Time spent: 45 minutes. More than 50% of the time was spent in counseling/coordination of care  Arnetha Courser, MD [facility]  If 7PM-7AM, please contact night-coverage [email or web address]  11/02/2021, 3:57 PM   This record has been created using Conservation officer, historic buildings. Errors have been sought and corrected,but may not always be located. Such creation errors do not reflect on the standard of care.

## 2021-11-02 NOTE — Progress Notes (Addendum)
Physical Therapy Treatment Patient Details Name: Tiffany [name] MRN: [id number] DOB: 1961/09/14 Today's Date: 11/02/2021   History of Present Illness Pt is a 60 y.o. female presenting to hospital 10/30 with increasing weakness, N/V, decreasing p.o. intake, and multiple falls.  Pt seen in ED at [facility] 10/27 and left AMA.  Pt admitted with hypokalemia, refractory N/V, frequent falls, and thrombocytopenia.  PMH includes h/o hiatal hernia, Nissen fundoplication, grade D esophagitis, bipolar 1 disorder, COPD, enlarged heart, and abdominal sx.    PT Comments    Pt in bed, enthusiastic to mobilize to recliner and overall agreeable to treatment. Pt quiet during treatment and requested to continue conversation throughout mobility due to rapid response occurring in AM. Pt able to maintain conversation throughout and progressed bed > recliner with minA (2+ for safety), RW. Transfers require MOD-A, RW and upon standing cues for hip extension. Some instability noted with standing. Pt left in recliner with all needs in reach end of session, vitals appropriate and monitored throughout. SNF remains primary d/c destination. Skilled PT intervention is indicated to address deficits in function, mobility, and to return to PLOF as able.    Recommendations for follow up therapy are one component of a multi-disciplinary discharge planning process, led by the attending physician.  Recommendations may be updated based on patient status, additional functional criteria and insurance authorization.  Follow Up Recommendations  Skilled nursing-short term rehab (<3 hours/day)     Assistance Recommended at Discharge Frequent or constant Supervision/Assistance  Equipment Recommendations  Rolling walker (2 wheels)    Recommendations for Other Services       Precautions / Restrictions Precautions Precautions: Fall Restrictions Weight Bearing Restrictions: Yes     Mobility  Bed Mobility Overal bed mobility: Needs  Assistance Bed Mobility: Supine to Sit     Supine to sit: Min assist          Transfers Overall transfer level: Needs assistance Equipment used: Rolling walker (2 wheels) Transfers: Sit to/from Stand Sit to Stand: Mod assist                Ambulation/Gait Ambulation/Gait assistance: Min guard Gait Distance (Feet): 2 Feet Assistive device: Rolling walker (2 wheels) Gait Pattern/deviations: Step-to pattern     General Gait Details: Bed to chair pt able to take small steps w/ vcs for weight shifting   Stairs             Wheelchair Mobility    Modified Rankin (Stroke Patients Only)       Balance Overall balance assessment: Needs assistance Sitting-balance support: Feet supported Sitting balance-Leahy Scale: Good     Standing balance support: Bilateral upper extremity supported Standing balance-Leahy Scale: Poor Standing balance comment: required BUE support                            Cognition Arousal/Alertness: Awake/alert Behavior During Therapy: WFL for tasks assessed/performed Overall Cognitive Status: Within Functional Limits for tasks assessed                                          Exercises      General Comments        Pertinent Vitals/Pain Pain Assessment: Faces Faces Pain Scale: Hurts a little bit Pain Location: back Pain Descriptors / Indicators: Grimacing Pain Intervention(s): Monitored during session    Home Living  Prior Function            PT Goals (current goals can now be found in the care plan section) Progress towards PT goals: Progressing toward goals    Frequency    Min 2X/week      PT Plan Current plan remains appropriate    Co-evaluation              AM-PAC PT "6 Clicks" Mobility   Outcome Measure  Help needed turning from your back to your side while in a flat bed without using bedrails?: None Help needed moving from lying  on your back to sitting on the side of a flat bed without using bedrails?: A Little Help needed moving to and from a bed to a chair (including a wheelchair)?: A Little Help needed standing up from a chair using your arms (e.g., wheelchair or bedside chair)?: A Lot Help needed to walk in hospital room?: A Lot Help needed climbing 3-5 steps with a railing? : Total 6 Click Score: 15    End of Session Equipment Utilized During Treatment: Gait belt Activity Tolerance: Patient tolerated treatment well Patient left: in chair;with call bell/phone within reach;with chair alarm set Nurse Communication: Mobility status PT Visit Diagnosis: Unsteadiness on feet (R26.81);Pain;Repeated falls (R29.6);Muscle weakness (generalized) (M62.81);History of falling (Z91.81)     Time: 9758-8325 PT Time Calculation (min) (ACUTE ONLY): 23 min  Charges:                       [employer], SPT

## 2021-11-03 DIAGNOSIS — E876 Hypokalemia: Secondary | ICD-10-CM | POA: Diagnosis not present

## 2021-11-03 LAB — BASIC METABOLIC PANEL
Anion gap: 11 (ref 5–15)
BUN: 10 mg/dL (ref 6–20)
CO2: 29 mmol/L (ref 22–32)
Calcium: 8.5 mg/dL — ABNORMAL LOW (ref 8.9–10.3)
Chloride: 96 mmol/L — ABNORMAL LOW (ref 98–111)
Creatinine, Ser: 0.66 mg/dL (ref 0.44–1.00)
GFR, Estimated: >60 mL/min (ref >60)
Glucose, Bld: 82 mg/dL (ref 70–99)
Potassium: 3.6 mmol/L (ref 3.5–5.1)
Sodium: 136 mmol/L (ref 135–145)

## 2021-11-03 LAB — CBC
HCT: 31.4 % — ABNORMAL LOW (ref 36.0–46.0)
Hemoglobin: 10 g/dL — ABNORMAL LOW (ref 12.0–15.0)
MCH: 26.5 pg (ref 26.0–34.0)
MCHC: 31.8 g/dL (ref 30.0–36.0)
MCV: 83.1 fL (ref 80.0–100.0)
Platelets: 88 10*3/uL — ABNORMAL LOW (ref 150–400)
RBC: 3.78 MIL/uL — ABNORMAL LOW (ref 3.87–5.11)
RDW: 14.4 % (ref 11.5–15.5)
WBC: 2.9 10*3/uL — ABNORMAL LOW (ref 4.0–10.5)
nRBC: 0 % (ref 0.0–0.2)

## 2021-11-03 MED ORDER — LORATADINE 10 MG PO TABS
10.0000 mg | ORAL_TABLET | Freq: Every evening | ORAL | Status: DC
  Administered 2021-11-03 – 2021-11-07 (×5): 10 mg via ORAL
  Filled 2021-11-03 (×5): qty 1

## 2021-11-03 MED ORDER — ATORVASTATIN CALCIUM 20 MG PO TABS
80.0000 mg | ORAL_TABLET | Freq: Every day | ORAL | Status: DC
  Administered 2021-11-03 – 2021-11-08 (×6): 80 mg via ORAL
  Filled 2021-11-03 (×6): qty 4

## 2021-11-03 MED ORDER — DULOXETINE HCL 30 MG PO CPEP
30.0000 mg | ORAL_CAPSULE | Freq: Every day | ORAL | Status: DC
  Administered 2021-11-03 – 2021-11-08 (×6): 30 mg via ORAL
  Filled 2021-11-03 (×6): qty 1

## 2021-11-03 MED ORDER — TRAZODONE HCL 50 MG PO TABS
100.0000 mg | ORAL_TABLET | Freq: Every evening | ORAL | Status: DC | PRN
  Filled 2021-11-03: qty 2

## 2021-11-03 NOTE — Progress Notes (Signed)
Occupational Therapy Treatment Patient Details Name: Tiffany [name] MRN: [id number] DOB: 09-Oct-1961 Today's Date: 11/03/2021   History of present illness Pt is a 60 y.o. female presenting to hospital 10/30 with increasing weakness, N/V, decreasing p.o. intake, and multiple falls.  Pt seen in ED at [facility] 10/27 and left AMA.  Pt admitted with hypokalemia, refractory N/V, frequent falls, and thrombocytopenia.  PMH includes h/o hiatal hernia, Nissen fundoplication, grade D esophagitis, bipolar 1 disorder, COPD, enlarged heart, and abdominal sx.   OT comments  Upon entering the room, pt supine in bed with no c/o pain but request assistance to transfer from bed >recliner chair. Pt performed bed mobility with min A for trunk support. While seated, pt changing soiled hospital gown with set up A. Pt reports, " I think we need a second person" and OT encouraged pt to attempt standing and she was able to do so with mod A. Pt with very exaggerated sways to the L <> R with weight shifting when taking steps to recliner chair with min - mod A for safety with use of RW. Once at recliner chair, pt has uncontrolled sit into recliner chair. Chair alarm activated. Pt washing face with set up A and feels comfortable about being in recliner chair. All needs within reach and pt continues to benefit from OT intervention. OT continues to recommend short term rehab stay before returning to home.    Recommendations for follow up therapy are one component of a multi-disciplinary discharge planning process, led by the attending physician.  Recommendations may be updated based on patient status, additional functional criteria and insurance authorization.    Follow Up Recommendations  Skilled nursing-short term rehab (<3 hours/day)    Assistance Recommended at Discharge Frequent or constant Supervision/Assistance  Equipment Recommendations  BSC       Precautions / Restrictions Precautions Precautions: Fall        Mobility Bed Mobility Overal bed mobility: Needs Assistance Bed Mobility: Supine to Sit     Supine to sit: Min assist          Transfers Overall transfer level: Needs assistance Equipment used: Rolling walker (2 wheels) Transfers: Sit to/from [employer] Sit to Stand: Mod assist Stand pivot transfers: Mod assist         General transfer comment: very large and exaggerated movements when up with RW causing pt to require min - mod A for safety with weight shift     Balance Overall balance assessment: Needs assistance Sitting-balance support: Feet supported Sitting balance-Leahy Scale: Good     Standing balance support: Bilateral upper extremity supported;During functional activity Standing balance-Leahy Scale: Poor                             ADL either performed or assessed with clinical judgement   ADL Overall ADL's : Needs assistance/impaired     Grooming: Wash/dry hands;Wash/dry face;Set up;Sitting                   Toilet Transfer: Moderate assistance;Rolling walker (2 wheels) Toilet Transfer Details (indicate cue type and reason): simulated                 Vision Patient Visual Report: No change from baseline            Cognition Arousal/Alertness: Awake/alert Behavior During Therapy: WFL for tasks assessed/performed Overall Cognitive Status: Within Functional Limits for tasks assessed  General Comments: A&O x4                     Pertinent Vitals/ Pain       Pain Assessment: No/denies pain Pain Location: back         Frequency  Min 2X/week        Progress Toward Goals  OT Goals(current goals can now be found in the care plan section)  Progress towards OT goals: Progressing toward goals  Acute Rehab OT Goals Patient Stated Goal: to get out of bed and into chair OT Goal Formulation: With patient Time For Goal Achievement:  11/13/21 Potential to Achieve Goals: Good  Plan Discharge plan remains appropriate;Frequency remains appropriate       AM-PAC OT "6 Clicks" Daily Activity     Outcome Measure   Help from another person eating meals?: None Help from another person taking care of personal grooming?: A Little Help from another person toileting, which includes using toliet, bedpan, or urinal?: A Lot Help from another person bathing (including washing, rinsing, drying)?: A Lot Help from another person to put on and taking off regular upper body clothing?: A Little Help from another person to put on and taking off regular lower body clothing?: A Lot 6 Click Score: 16    End of Session    OT Visit Diagnosis: Unsteadiness on feet (R26.81);Repeated falls (R29.6);History of falling (Z91.81)   Activity Tolerance Patient tolerated treatment well   Patient Left in chair;with call bell/phone within reach;with chair alarm set   Nurse Communication Mobility status        Time: 1610-9604 OT Time Calculation (min): 23 min  Charges: OT General Charges $OT Visit: 1 Visit OT Treatments $Self Care/Home Management : 8-22 mins $Therapeutic Activity: 8-22 mins  Jackquline Denmark, MS, OTR/L , CBIS ascom [phone]  11/03/21, 1:24 PM

## 2021-11-03 NOTE — Progress Notes (Signed)
PROGRESS NOTE    Tiffany [name]  OAC:[id number] DOB: 01-29-1961 DOA: 10/29/2021 PCP: Eddie Dibbles, MD   Brief Narrative: Taken from prior notes. Tiffany [name] is a 60 y.o. female with medical history significant for morbid obesity with a BMI of 46, history of GERD status post nissen fundoplication, bipolar disorder, COPD, migraines who presents to the ER via EMS for evaluation of weakness and frequent falls. He states that patient has had nausea and vomiting for weeks and has not been able to tolerate any oral intake.   She was admitted to the hospital at [facility] and had an upper endoscopy in 08/22 which showed diverticulum in the distal esophagus with grade D esophagitis and a small paraesophageal hernia.  She was discharged home in stable condition and placed on twice daily PPI. GI was again consulted during current hospitalization at [facility] and they are recommending continuation of PPI.  She was also found to have hypokalemia, hypophosphatemia and hypomagnesemia, electrolytes are being repleted.  Vitamin D levels were very low at 5.50 and she was started on supplement. UA concerning for UTI, no urine cultures and she was started on ceftriaxone.  PT recommended SNF placement.  11/1.  There was some concern of unresponsiveness which occurred suddenly, rapid response was called.  Vitals and CBG within normal limit.  ABG with mild hypercapnia and hypoxia. Giving a trial of BiPAP as she appears little more somnolent.  11/2: Patient was more alert and oriented and sitting in chair.  Having some nausea and vomiting with p.o. intake. Later had an assisted fall to floor due to profound weakness while transferring from chair to bed.  Some complaint of ankle pain but no significant abnormality.  11/3: Rapid response was again called earlier due to decreased responsiveness.  Vitals stable, CBG 80.  ABG with CO2 of 54 and O2 of 66.  Patient again refused to wear BiPAP at night. Another  discussion that why it is important and if she continued to refuse then we might not be able to help her, most likely she is having hypoventilation syndrome due to morbid obesity.  11/4; patient had a bed offer, pending insurance authorization.  Subjective: Patient was sitting comfortably when seen today.  Much more alert and oriented.  She was able to use BiPAP overnight.  Assessment & Plan:   Principal Problem:   Hypokalemia Active Problems:   Frequent falls   Refractory nausea and vomiting   Thrombocytopenia (HCC)   GERD (gastroesophageal reflux disease)  Generalized weakness/frequent falls.  Had multiple electrolyte derangements and frequent nausea and vomiting can be contributory.  PT is recommending SNF placement.  No focal deficit.  Strength seems within normal limit. No focal deficit -TOC is working on it, had a bed offer at [facility], working on [employer] as a second language teacher.  Increased somnolence/unresponsiveness.  Quite unusual presentation, can be functional but ABG with mildly elevated CO2 and mild hypoxia.  That can be due to hypoventilation syndrome with morbid obesity.  Frequent calling of rapid response with decreased responsiveness, doubt it is medically significant. Most likely underlying psych issues.  Able to tolerate BiPAP last night. -Continue with BiPAP at night.  Assisted fall.  No significant injury, patient was complaining of left ankle pain without any significant abnormality.  Left ankle and knee imaging was done due to her complaint of pain and it was negative for any acute abnormality.  Electrolyte abnormalities.  Most likely secondary to GI losses and poor p.o. intake.  Hypophosphatemia has been  resolved, continue to have intermittent hypokalemia and hypomagnesemia. -Replete electrolytes as needed. -Continue to monitor  Anemia secondary to folate deficiency.  Hemoglobin stable, iron and B12 within normal limit. -Continue with supplement  Vitamin D  deficiency.  Vitamin D at 5.5.  Can be contributory to frequent falls and weakness. -Continue with supplement-50,000 units weekly for 8 weeks followed by 2000 units daily.  Thrombocytopenia.  Seems chronic and stable. -Continue to monitor  Class III/morbid obesity. Estimated body mass index is 42.75 kg/m as calculated from the following:   Height as of this encounter: 5\' 9"  (1.753 m).   Weight as of this encounter: 131.3 kg.   Objective: Vitals:   11/03/21 0537 11/03/21 0815 11/03/21 1128 11/03/21 1646  BP: 139/86 (!) 158/88 (!) 148/92 139/77  Pulse: 85 (!) 103 (!) 107 (!) 105  Resp: 18 20 20 18   Temp: 98 F (36.7 C) 98 F (36.7 C) 98.5 F (36.9 C) 98.3 F (36.8 C)  TempSrc:   Oral Oral  SpO2: 93% 95% 97% 93%  Weight:      Height:        Intake/Output Summary (Last 24 hours) at 11/03/2021 1732 Last data filed at 11/03/2021 1358 Gross per 24 hour  Intake 560 ml  Output 500 ml  Net 60 ml    Filed Weights   10/29/21 0915 11/02/21 1336  Weight: (!) 143 kg 131.3 kg    Examination: General.  Morbidly obese lady, in no acute distress. Pulmonary.  Lungs clear bilaterally, normal respiratory effort. CV.  Regular rate and rhythm, no JVD, rub or murmur. Abdomen.  Soft, nontender, nondistended, BS positive. CNS.  Alert and oriented .  No focal neurologic deficit. Extremities.  No edema, no cyanosis, pulses intact and symmetrical.  DVT prophylaxis: Lovenox Code Status: Full Family Communication:  Disposition Plan:  Status is: Inpatient  Remains inpatient appropriate because: Severity of illness   Level of care: Med-Surg  All the records are reviewed and case discussed with Care Management/Social Worker. Management plans discussed with the patient, nursing and they are in agreement.  Consultants:  GI  Procedures:  Antimicrobials:  Ceftriaxone  Data Reviewed: I have personally reviewed following labs and imaging studies  CBC: Recent Labs  Lab 10/30/21 0824  10/31/21 0448 11/01/21 0514 11/02/21 0506 11/03/21 0736  WBC 3.3* 2.8* 2.4* 3.1* 2.9*  NEUTROABS 2.1  --   --   --   --   HGB 10.9* 10.4* 10.2* 10.1* 10.0*  HCT 33.0* 30.8* 30.0* 31.7* 31.4*  MCV 83.8 84.6 84.7 84.3 83.1  PLT 100* 100* 97* 98* 88*    Basic Metabolic Panel: Recent Labs  Lab 10/29/21 0917 10/30/21 0819 10/31/21 0448 11/01/21 0514 11/02/21 0506 11/03/21 0736  NA 134* 135 135 138 137 136  K 2.4* 3.8 2.9* 3.4* 3.4* 3.6  CL 86* 94* 95* 99 97* 96*  CO2 34* 31 31 32 30 29  GLUCOSE 97 91 94 89 79 82  BUN 11 9 10 9 11 10   CREATININE 0.80 0.61 0.62 0.72 0.59 0.66  CALCIUM 8.6* 8.1* 8.0* 8.1* 8.3* 8.5*  MG 1.8 2.1 1.8 2.0 1.7  --   PHOS  --  2.2* 2.5 2.1* 3.9  --     GFR: Estimated Creatinine Clearance: 111.6 mL/min (by C-G formula based on SCr of 0.66 mg/dL). Liver Function Tests: No results for input(s): AST, ALT, ALKPHOS, BILITOT, PROT, ALBUMIN in the last 168 hours.  No results for input(s): LIPASE, AMYLASE in the last 168  hours. No results for input(s): AMMONIA in the last 168 hours. Coagulation Profile: No results for input(s): INR, PROTIME in the last 168 hours. Cardiac Enzymes: No results for input(s): CKTOTAL, CKMB, CKMBINDEX, TROPONINI in the last 168 hours. BNP (last 3 results) No results for input(s): PROBNP in the last 8760 hours. HbA1C: No results for input(s): HGBA1C in the last 72 hours. CBG: Recent Labs  Lab 10/30/21 2039 10/31/21 1210 10/31/21 2024 11/02/21 0838  GLUCAP 94 120* 95 80    Lipid Profile: No results for input(s): CHOL, HDL, LDLCALC, TRIG, CHOLHDL, LDLDIRECT in the last 72 hours. Thyroid Function Tests: No results for input(s): TSH, T4TOTAL, FREET4, T3FREE, THYROIDAB in the last 72 hours. Anemia Panel: No results for input(s): VITAMINB12, FOLATE, FERRITIN, TIBC, IRON, RETICCTPCT in the last 72 hours.  Sepsis Labs: Recent Labs  Lab 10/29/21 1142  LATICACIDVEN 1.2     Recent Results (from the past 240 hour(s))   Resp Panel by RT-PCR (Flu A&B, Covid) Nasopharyngeal Swab     Status: None   Collection Time: 10/29/21 11:42 AM   Specimen: Nasopharyngeal Swab; Nasopharyngeal(NP) swabs in vial transport medium  Result Value Ref Range Status   SARS Coronavirus 2 by RT PCR NEGATIVE NEGATIVE Final    Comment: (NOTE) SARS-CoV-2 target nucleic acids are NOT DETECTED.  The SARS-CoV-2 RNA is generally detectable in upper respiratory specimens during the acute phase of infection. The lowest concentration of SARS-CoV-2 viral copies this assay can detect is 138 copies/mL. A negative result does not preclude SARS-Cov-2 infection and should not be used as the sole basis for treatment or other patient management decisions. A negative result may occur with  improper specimen collection/handling, submission of specimen other than nasopharyngeal swab, presence of viral mutation(s) within the areas targeted by this assay, and inadequate number of viral copies(<138 copies/mL). A negative result must be combined with clinical observations, patient history, and epidemiological information. The expected result is Negative.  Fact Sheet for Patients:  BloggerCourse.com  Fact Sheet for Healthcare Providers:  SeriousBroker.it  This test is no t yet approved or cleared by the [place] FDA and  has been authorized for detection and/or diagnosis of SARS-CoV-2 by FDA under an Emergency Use Authorization (EUA). This EUA will remain  in effect (meaning this test can be used) for the duration of the COVID-19 declaration under Section 564(b)(1) of the Act, 21 U.S.C.section 360bbb-3(b)(1), unless the authorization is terminated  or revoked sooner.       Influenza A by PCR NEGATIVE NEGATIVE Final   Influenza B by PCR NEGATIVE NEGATIVE Final    Comment: (NOTE) The Xpert Xpress SARS-CoV-2/FLU/RSV plus assay is intended as an aid in the diagnosis of influenza from  Nasopharyngeal swab specimens and should not be used as a sole basis for treatment. Nasal washings and aspirates are unacceptable for Xpert Xpress SARS-CoV-2/FLU/RSV testing.  Fact Sheet for Patients: BloggerCourse.com  Fact Sheet for Healthcare Providers: SeriousBroker.it  This test is not yet approved or cleared by the [place] FDA and has been authorized for detection and/or diagnosis of SARS-CoV-2 by FDA under an Emergency Use Authorization (EUA). This EUA will remain in effect (meaning this test can be used) for the duration of the COVID-19 declaration under Section 564(b)(1) of the Act, 21 U.S.C. section 360bbb-3(b)(1), unless the authorization is terminated or revoked.  Performed at [facility], [address] [place], [place] [address]       Radiology Studies: DG Knee 1-2 Views Left  Result Date:  11/01/2021 CLINICAL DATA:  Status post fall. EXAM: LEFT KNEE - 1-2 VIEW COMPARISON:  None. FINDINGS: No evidence of fracture, dislocation, or joint effusion. No evidence of arthropathy or other focal bone abnormality. Soft tissues are unremarkable. IMPRESSION: Negative. Electronically Signed   By: Aram Candela M.D.   On: 11/01/2021 19:56   DG Ankle 2 Views Left  Result Date: 11/01/2021 CLINICAL DATA:  Status post fall. EXAM: LEFT ANKLE - 2 VIEW COMPARISON:  None. FINDINGS: There is no evidence of fracture, dislocation, or joint effusion. There is no evidence of arthropathy or other focal bone abnormality. Mild diffuse soft tissue swelling is seen. IMPRESSION: Mild diffuse soft tissue swelling without an acute osseous abnormality. Electronically Signed   By: Aram Candela M.D.   On: 11/01/2021 20:00    Scheduled Meds:  atorvastatin  80 mg Oral Daily   DULoxetine  30 mg Oral Daily   feeding supplement (KATE FARMS STANDARD 1.4)  325 mL Oral TID BM   folic acid  5 mg Oral Daily   loratadine  10 mg Oral QPM    metoCLOPramide  10 mg Oral TID AC   multivitamin with minerals  1 tablet Oral Daily   permethrin   Topical Once   polyethylene glycol  17 g Oral BID   Vitamin D (Ergocalciferol)  50,000 Units Oral Q7 days   Continuous Infusions:     LOS: 5 days   Time spent: 38 minutes. More than 50% of the time was spent in counseling/coordination of care  Arnetha Courser, MD [facility]  If 7PM-7AM, please contact night-coverage [email or web address]  11/03/2021, 5:32 PM   This record has been created using Conservation officer, historic buildings. Errors have been sought and corrected,but may not always be located. Such creation errors do not reflect on the standard of care.

## 2021-11-03 NOTE — TOC Progression Note (Signed)
Transition of Care [facility]) - Progression Note    Patient Details  Name: Tiffany [name] MRN: [id number] Date of Birth: [birth year]  Transition of [facility]) CM/SW Contact  Allayne Butcher, RN Phone Number: 11/03/2021, 3:15 PM  Clinical Narrative:    Insurance authorization is still pending.     Expected Discharge Plan: Skilled Nursing Facility Barriers to Discharge: Continued Medical Work up  Expected Discharge Plan and Services Expected Discharge Plan: Skilled Nursing Facility In-house Referral: NA   Post Acute Care Choice: Skilled Nursing Facility Living arrangements for the past 2 months: Single Family Home                                       Social Determinants of Health (SDOH) Interventions    Readmission Risk Interventions No flowsheet data found.

## 2021-11-04 DIAGNOSIS — E876 Hypokalemia: Secondary | ICD-10-CM | POA: Diagnosis not present

## 2021-11-04 LAB — MAGNESIUM: Magnesium: 1.8 mg/dL (ref 1.7–2.4)

## 2021-11-04 LAB — BASIC METABOLIC PANEL
Anion gap: 10 (ref 5–15)
BUN: 16 mg/dL (ref 6–20)
CO2: 31 mmol/L (ref 22–32)
Calcium: 8.4 mg/dL — ABNORMAL LOW (ref 8.9–10.3)
Chloride: 93 mmol/L — ABNORMAL LOW (ref 98–111)
Creatinine, Ser: 0.6 mg/dL (ref 0.44–1.00)
GFR, Estimated: >60 mL/min (ref >60)
Glucose, Bld: 101 mg/dL — ABNORMAL HIGH (ref 70–99)
Potassium: 3.2 mmol/L — ABNORMAL LOW (ref 3.5–5.1)
Sodium: 134 mmol/L — ABNORMAL LOW (ref 135–145)

## 2021-11-04 LAB — CBC
HCT: 29.7 % — ABNORMAL LOW (ref 36.0–46.0)
Hemoglobin: 9.8 g/dL — ABNORMAL LOW (ref 12.0–15.0)
MCH: 27.5 pg (ref 26.0–34.0)
MCHC: 33 g/dL (ref 30.0–36.0)
MCV: 83.2 fL (ref 80.0–100.0)
Platelets: 96 10*3/uL — ABNORMAL LOW (ref 150–400)
RBC: 3.57 MIL/uL — ABNORMAL LOW (ref 3.87–5.11)
RDW: 13.9 % (ref 11.5–15.5)
WBC: 3.6 10*3/uL — ABNORMAL LOW (ref 4.0–10.5)
nRBC: 0 % (ref 0.0–0.2)

## 2021-11-04 MED ORDER — MAGNESIUM SULFATE 2 GM/50ML IV SOLN
2.0000 g | Freq: Once | INTRAVENOUS | Status: AC
  Administered 2021-11-04: 2 g via INTRAVENOUS
  Filled 2021-11-04: qty 50

## 2021-11-04 MED ORDER — POTASSIUM CHLORIDE CRYS ER 20 MEQ PO TBCR
40.0000 meq | EXTENDED_RELEASE_TABLET | Freq: Once | ORAL | Status: AC
  Administered 2021-11-04: 40 meq via ORAL
  Filled 2021-11-04: qty 2

## 2021-11-04 MED ORDER — DICLOFENAC SODIUM 1 % EX GEL
4.0000 g | Freq: Four times a day (QID) | CUTANEOUS | Status: DC
  Administered 2021-11-04 – 2021-11-08 (×16): 4 g via TOPICAL
  Filled 2021-11-04: qty 100

## 2021-11-04 NOTE — Progress Notes (Signed)
PROGRESS NOTE    Tiffany [name]  YQM:[id number] DOB: Apr 28, 1961 DOA: 10/29/2021 PCP: Eddie Dibbles, MD   Brief Narrative: Taken from prior notes. Tiffany [name] is a 60 y.o. female with medical history significant for morbid obesity with a BMI of 46, history of GERD status post nissen fundoplication, bipolar disorder, COPD, migraines who presents to the ER via EMS for evaluation of weakness and frequent falls. He states that patient has had nausea and vomiting for weeks and has not been able to tolerate any oral intake.   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  She was discharged home in stable condition and placed on twice daily PPI. GI was again consulted during current hospitalization at [facility] and they are recommending continuation of PPI.  She was also found to have hypokalemia, hypophosphatemia and hypomagnesemia, electrolytes are being repleted.  Vitamin D levels were very low at 5.50 and she was started on supplement. UA concerning for UTI, no urine cultures and she was started on ceftriaxone.  PT recommended SNF placement.  11/1.  There was some concern of unresponsiveness which occurred suddenly, rapid response was called.  Vitals and CBG within normal limit.  ABG with mild hypercapnia and hypoxia. Giving a trial of BiPAP as she appears little more somnolent.  11/2: Patient was more alert and oriented and sitting in chair.  Having some nausea and vomiting with p.o. intake. Later had an assisted fall to floor due to profound weakness while transferring from chair to bed.  Some complaint of ankle pain but no significant abnormality.  11/3: Rapid response was again called earlier due to decreased responsiveness.  Vitals stable, CBG 80.  ABG with CO2 of 54 and O2 of 66.  Patient again refused to wear BiPAP at night. Another  discussion that why it is important and if she continued to refuse then we might not be able to help her, most likely she is having hypoventilation syndrome due to morbid obesity.  11/4; patient had a bed offer, pending insurance authorization.  Subjective: Patient was sitting comfortably at the side of the bed and eating breakfast when seen today.  Multiple family members at bedside.  She was complaining that there was no better in her breakfast tray, stating that she just had allergies to raw milk and not to butter.  Assessment & Plan:   Principal Problem:   Hypokalemia Active Problems:   Frequent falls   Refractory nausea and vomiting   Thrombocytopenia (HCC)   GERD (gastroesophageal reflux disease)  Generalized weakness/frequent falls.  Had multiple electrolyte derangements and frequent nausea and vomiting can be contributory.  PT is recommending SNF placement.  No focal deficit.  Strength seems within normal limit. No focal deficit -TOC is working on it, had a bed offer at [place], working on English as a second language teacher.  Increased somnolence/unresponsiveness.  Quite unusual presentation, can be functional but ABG with mildly elevated CO2 and mild hypoxia.  That can be due to hypoventilation syndrome with morbid obesity.  Frequent calling of rapid response with decreased responsiveness, doubt it is medically significant. Most likely underlying psych issues.  Able to tolerate BiPAP last night. -Continue with BiPAP at night.  Assisted fall.  No significant injury, patient was complaining of left ankle pain without any significant abnormality.  Left ankle and knee imaging was done due to her complaint of  pain and it was negative for any acute abnormality.  Electrolyte abnormalities.  Most likely secondary to GI losses and poor p.o. intake.  Hypophosphatemia has been resolved, continue to have intermittent hypokalemia and hypomagnesemia. -Replete electrolytes as needed. -Continue to  monitor  Anemia secondary to folate deficiency.  Hemoglobin stable, iron and B12 within normal limit. -Continue with supplement  Vitamin D deficiency.  Vitamin D at 5.5.  Can be contributory to frequent falls and weakness. -Continue with supplement-50,000 units weekly for 8 weeks followed by 2000 units daily.  Thrombocytopenia.  Seems chronic and stable. -Continue to monitor  Class III/morbid obesity. Estimated body mass index is 42.75 kg/m as calculated from the following:   Height as of this encounter: 5\' 9"  (1.753 m).   Weight as of this encounter: 131.3 kg.   Objective: Vitals:   11/03/21 2147 11/04/21 0625 11/04/21 0800 11/04/21 1200  BP: 128/75 128/69 (!) 151/71 128/90  Pulse: 99 98 (!) 104 (!) 102  Resp: 14 12 20 14   Temp:  98.3 F (36.8 C) 99.6 F (37.6 C) 99.5 F (37.5 C)  TempSrc:    Oral  SpO2: 97% 94% 95% 99%  Weight:      Height:        Intake/Output Summary (Last 24 hours) at 11/04/2021 1502 Last data filed at 11/04/2021 0900 Gross per 24 hour  Intake 148.96 ml  Output 550 ml  Net -401.04 ml    Filed Weights   10/29/21 0915 11/02/21 1336  Weight: (!) 143 kg 131.3 kg    Examination: General.  Obese lady, in no acute distress. Pulmonary.  Lungs clear bilaterally, normal respiratory effort. CV.  Regular rate and rhythm, no JVD, rub or murmur. Abdomen.  Soft, nontender, nondistended, BS positive. CNS.  Alert and oriented .  No focal neurologic deficit. Extremities.  No edema, no cyanosis, pulses intact and symmetrical. Psychiatry.  Judgment and insight appears normal.   DVT prophylaxis: Lovenox Code Status: Full Family Communication: Significant other and multiple other family members in room. Disposition Plan:  Status is: Inpatient  Remains inpatient appropriate because: Severity of illness, currently medical stable waiting for insurance authorization for SNF.   Level of care: Med-Surg  All the records are reviewed and case discussed with  Care Management/Social Worker. Management plans discussed with the patient, nursing and they are in agreement.  Consultants:  GI  Procedures:  Antimicrobials:  Ceftriaxone  Data Reviewed: I have personally reviewed following labs and imaging studies  CBC: Recent Labs  Lab 10/30/21 0824 10/31/21 0448 11/01/21 0514 11/02/21 0506 11/03/21 0736 11/04/21 0446  WBC 3.3* 2.8* 2.4* 3.1* 2.9* 3.6*  NEUTROABS 2.1  --   --   --   --   --   HGB 10.9* 10.4* 10.2* 10.1* 10.0* 9.8*  HCT 33.0* 30.8* 30.0* 31.7* 31.4* 29.7*  MCV 83.8 84.6 84.7 84.3 83.1 83.2  PLT 100* 100* 97* 98* 88* 96*    Basic Metabolic Panel: Recent Labs  Lab 10/30/21 0819 10/31/21 0448 11/01/21 0514 11/02/21 0506 11/03/21 0736 11/04/21 0446  NA 135 135 138 137 136 134*  K 3.8 2.9* 3.4* 3.4* 3.6 3.2*  CL 94* 95* 99 97* 96* 93*  CO2 31 31 32 30 29 31   GLUCOSE 91 94 89 79 82 101*  BUN 9 10 9 11 10 16   CREATININE 0.61 0.62 0.72 0.59 0.66 0.60  CALCIUM 8.1* 8.0* 8.1* 8.3* 8.5* 8.4*  MG 2.1 1.8 2.0 1.7  --  1.8  PHOS 2.2*  2.5 2.1* 3.9  --   --     GFR: Estimated Creatinine Clearance: 111.6 mL/min (by C-G formula based on SCr of 0.6 mg/dL). Liver Function Tests: No results for input(s): AST, ALT, ALKPHOS, BILITOT, PROT, ALBUMIN in the last 168 hours.  No results for input(s): LIPASE, AMYLASE in the last 168 hours. No results for input(s): AMMONIA in the last 168 hours. Coagulation Profile: No results for input(s): INR, PROTIME in the last 168 hours. Cardiac Enzymes: No results for input(s): CKTOTAL, CKMB, CKMBINDEX, TROPONINI in the last 168 hours. BNP (last 3 results) No results for input(s): PROBNP in the last 8760 hours. HbA1C: No results for input(s): HGBA1C in the last 72 hours. CBG: Recent Labs  Lab 10/30/21 2039 10/31/21 1210 10/31/21 2024 11/02/21 0838  GLUCAP 94 120* 95 80    Lipid Profile: No results for input(s): CHOL, HDL, LDLCALC, TRIG, CHOLHDL, LDLDIRECT in the last 72  hours. Thyroid Function Tests: No results for input(s): TSH, T4TOTAL, FREET4, T3FREE, THYROIDAB in the last 72 hours. Anemia Panel: No results for input(s): VITAMINB12, FOLATE, FERRITIN, TIBC, IRON, RETICCTPCT in the last 72 hours.  Sepsis Labs: Recent Labs  Lab 10/29/21 1142  LATICACIDVEN 1.2     Recent Results (from the past 240 hour(s))  Resp Panel by RT-PCR (Flu A&B, Covid) Nasopharyngeal Swab     Status: None   Collection Time: 10/29/21 11:42 AM   Specimen: Nasopharyngeal Swab; Nasopharyngeal(NP) swabs in vial transport medium  Result Value Ref Range Status   SARS Coronavirus 2 by RT PCR NEGATIVE NEGATIVE Final    Comment: (NOTE) SARS-CoV-2 target nucleic acids are NOT DETECTED.  The SARS-CoV-2 RNA is generally detectable in upper respiratory specimens during the acute phase of infection. The lowest concentration of SARS-CoV-2 viral copies this assay can detect is 138 copies/mL. A negative result does not preclude SARS-Cov-2 infection and should not be used as the sole basis for treatment or other patient management decisions. A negative result may occur with  improper specimen collection/handling, submission of specimen other than nasopharyngeal swab, presence of viral mutation(s) within the areas targeted by this assay, and inadequate number of viral copies(<138 copies/mL). A negative result must be combined with clinical observations, patient history, and epidemiological information. The expected result is Negative.  Fact Sheet for Patients:  BloggerCourse.com  Fact Sheet for Healthcare Providers:  SeriousBroker.it  This test is no t yet approved or cleared by the [place] FDA and  has been authorized for detection and/or diagnosis of SARS-CoV-2 by FDA under an Emergency Use Authorization (EUA). This EUA will remain  in effect (meaning this test can be used) for the duration of the COVID-19 declaration under  Section 564(b)(1) of the Act, 21 U.S.C.section 360bbb-3(b)(1), unless the authorization is terminated  or revoked sooner.       Influenza A by PCR NEGATIVE NEGATIVE Final   Influenza B by PCR NEGATIVE NEGATIVE Final    Comment: (NOTE) The Xpert Xpress SARS-CoV-2/FLU/RSV plus assay is intended as an aid in the diagnosis of influenza from Nasopharyngeal swab specimens and should not be used as a sole basis for treatment. Nasal washings and aspirates are unacceptable for Xpert Xpress SARS-CoV-2/FLU/RSV testing.  Fact Sheet for Patients: BloggerCourse.com  Fact Sheet for Healthcare Providers: SeriousBroker.it  This test is not yet approved or cleared by the [place] FDA and has been authorized for detection and/or diagnosis of SARS-CoV-2 by FDA under an Emergency Use Authorization (EUA). This EUA will remain in effect (meaning this test  can be used) for the duration of the COVID-19 declaration under Section 564(b)(1) of the Act, 21 U.S.C. section 360bbb-3(b)(1), unless the authorization is terminated or revoked.  Performed at [facility], [address], [place], [place] [address]       Radiology Studies: No results found.  Scheduled Meds:  atorvastatin  80 mg Oral Daily   diclofenac Sodium  4 g Topical QID   DULoxetine  30 mg Oral Daily   feeding supplement (KATE FARMS STANDARD 1.4)  325 mL Oral TID BM   folic acid  5 mg Oral Daily   loratadine  10 mg Oral QPM   metoCLOPramide  10 mg Oral TID AC   multivitamin with minerals  1 tablet Oral Daily   permethrin   Topical Once   polyethylene glycol  17 g Oral BID   Vitamin D (Ergocalciferol)  50,000 Units Oral Q7 days   Continuous Infusions:     LOS: 6 days   Time spent: 35 minutes. More than 50% of the time was spent in counseling/coordination of care  Arnetha Courser, MD [facility]  If 7PM-7AM, please contact  night-coverage [email or web address]  11/04/2021, 3:02 PM   This record has been created using Conservation officer, historic buildings. Errors have been sought and corrected,but may not always be located. Such creation errors do not reflect on the standard of care.

## 2021-11-05 DIAGNOSIS — E876 Hypokalemia: Secondary | ICD-10-CM | POA: Diagnosis not present

## 2021-11-05 NOTE — Progress Notes (Signed)
PROGRESS NOTE    Tiffany [name]  PIR:[id number] DOB: [birth year] DOA: 10/29/2021 PCP: Eddie Dibbles, MD   Brief Narrative: Taken from prior notes. Tiffany [name] is a 60 y.o. female with medical history significant for morbid obesity with a BMI of 46, history of GERD status post nissen fundoplication, bipolar disorder, COPD, migraines who presents to the ER via EMS for evaluation of weakness and frequent falls. He states that patient has had nausea and vomiting for weeks and has not been able to tolerate any oral intake.   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  She was discharged home in stable condition and placed on twice daily PPI. GI was again consulted during current hospitalization at [facility] and they are recommending continuation of PPI.  She was also found to have hypokalemia, hypophosphatemia and hypomagnesemia, electrolytes are being repleted.  Vitamin D levels were very low at 5.50 and she was started on supplement. UA concerning for UTI, no urine cultures and she was started on ceftriaxone.  PT recommended SNF placement.  11/1.  There was some concern of unresponsiveness which occurred suddenly, rapid response was called.  Vitals and CBG within normal limit.  ABG with mild hypercapnia and hypoxia. Giving a trial of BiPAP as she appears little more somnolent.  11/2: Patient was more alert and oriented and sitting in chair.  Having some nausea and vomiting with p.o. intake. Later had an assisted fall to floor due to profound weakness while transferring from chair to bed.  Some complaint of ankle pain but no significant abnormality.  11/3: Rapid response was again called earlier due to decreased responsiveness.  Vitals stable, CBG 80.  ABG with CO2 of 54 and O2 of 66.  Patient again refused to wear BiPAP at night. Another  discussion that why it is important and if she continued to refuse then we might not be able to help her, most likely she is having hypoventilation syndrome due to morbid obesity.  11/4; patient had a bed offer, pending insurance authorization.  Subjective: Patient was seen and examined today.  Significant other at bedside.  No new complaints.  She was to be started on regular diet.  Assessment & Plan:   Principal Problem:   Hypokalemia Active Problems:   Frequent falls   Refractory nausea and vomiting   Thrombocytopenia (HCC)   GERD (gastroesophageal reflux disease)  Generalized weakness/frequent falls.  Had multiple electrolyte derangements and frequent nausea and vomiting can be contributory.  PT is recommending SNF placement.  No focal deficit.  Strength seems within normal limit. No focal deficit -TOC is working on it, had a bed offer at [place], working on English as a second language teacher.  Increased somnolence/unresponsiveness.  Quite unusual presentation, can be functional but ABG with mildly elevated CO2 and mild hypoxia.  That can be due to hypoventilation syndrome with morbid obesity.  Frequent calling of rapid response with decreased responsiveness, doubt it is medically significant. Most likely underlying psych issues.  Able to tolerate BiPAP last night. -Continue with BiPAP at night.  Assisted fall.  No significant injury, patient was complaining of left ankle pain without any significant abnormality.  Left ankle and knee imaging was done due to her complaint of pain and it was negative for any acute abnormality.  Electrolyte abnormalities.  Most likely secondary to GI losses and poor p.o. intake.  Hypophosphatemia has been resolved, continue to have intermittent hypokalemia and hypomagnesemia. -Replete electrolytes as needed. -Continue to monitor  Anemia secondary to folate deficiency.  Hemoglobin stable, iron and B12 within normal limit. -Continue with supplement  Vitamin  D deficiency.  Vitamin D at 5.5.  Can be contributory to frequent falls and weakness. -Continue with supplement-50,000 units weekly for 8 weeks followed by 2000 units daily.  Thrombocytopenia.  Seems chronic and stable. -Continue to monitor  Class III/morbid obesity. Estimated body mass index is 42.75 kg/m as calculated from the following:   Height as of this encounter: 5\' 9"  (1.753 m).   Weight as of this encounter: 131.3 kg.   Objective: Vitals:   11/05/21 0138 11/05/21 0357 11/05/21 0751 11/05/21 1235  BP: 137/90 (!) 104/54 131/66 135/80  Pulse: 97 (!) 104 100 98  Resp: 18 16 20 20   Temp: 97.9 F (36.6 C) 98.2 F (36.8 C) 98.4 F (36.9 C) 98.4 F (36.9 C)  TempSrc: Oral Oral Oral Oral  SpO2: 97% 93% 97% 91%  Weight:      Height:        Intake/Output Summary (Last 24 hours) at 11/05/2021 1501 Last data filed at 11/05/2021 0354 Gross per 24 hour  Intake --  Output 200 ml  Net -200 ml    Filed Weights   10/29/21 0915 11/02/21 1336  Weight: (!) 143 kg 131.3 kg    Examination: General.  Morbidly obese lady, in no acute distress. Pulmonary.  Lungs clear bilaterally, normal respiratory effort. CV.  Regular rate and rhythm, no JVD, rub or murmur. Abdomen.  Soft, nontender, nondistended, BS positive. CNS.  Alert and oriented .  No focal neurologic deficit. Extremities.  No edema, no cyanosis, pulses intact and symmetrical. Psychiatry.  Judgment and insight appears normal.   DVT prophylaxis: Lovenox Code Status: Full Family Communication: Significant other and multiple other family members in room. Disposition Plan:  Status is: Inpatient  Remains inpatient appropriate because: Severity of illness, currently medical stable waiting for insurance authorization for SNF.   Level of care: Med-Surg  All the records are reviewed and case discussed with Care Management/Social Worker. Management plans discussed with the patient, nursing and they are in  agreement.  Consultants:  GI  Procedures:  Antimicrobials:   Data Reviewed: I have personally reviewed following labs and imaging studies  CBC: Recent Labs  Lab 10/30/21 0824 10/31/21 0448 11/01/21 0514 11/02/21 0506 11/03/21 0736 11/04/21 0446  WBC 3.3* 2.8* 2.4* 3.1* 2.9* 3.6*  NEUTROABS 2.1  --   --   --   --   --   HGB 10.9* 10.4* 10.2* 10.1* 10.0* 9.8*  HCT 33.0* 30.8* 30.0* 31.7* 31.4* 29.7*  MCV 83.8 84.6 84.7 84.3 83.1 83.2  PLT 100* 100* 97* 98* 88* 96*    Basic Metabolic Panel: Recent Labs  Lab 10/30/21 0819 10/31/21 0448 11/01/21 0514 11/02/21 0506 11/03/21 0736 11/04/21 0446  NA 135 135 138 137 136 134*  K 3.8 2.9* 3.4* 3.4* 3.6 3.2*  CL 94* 95* 99 97* 96* 93*  CO2 31 31 32 30 29 31   GLUCOSE 91 94 89 79 82 101*  BUN 9 10 9 11 10 16   CREATININE 0.61 0.62 0.72 0.59 0.66 0.60  CALCIUM 8.1* 8.0* 8.1* 8.3* 8.5* 8.4*  MG 2.1 1.8 2.0 1.7  --  1.8  PHOS 2.2* 2.5 2.1* 3.9  --   --     GFR: Estimated Creatinine Clearance: 111.6 mL/min (by C-G formula based on  SCr of 0.6 mg/dL). Liver Function Tests: No results for input(s): AST, ALT, ALKPHOS, BILITOT, PROT, ALBUMIN in the last 168 hours.  No results for input(s): LIPASE, AMYLASE in the last 168 hours. No results for input(s): AMMONIA in the last 168 hours. Coagulation Profile: No results for input(s): INR, PROTIME in the last 168 hours. Cardiac Enzymes: No results for input(s): CKTOTAL, CKMB, CKMBINDEX, TROPONINI in the last 168 hours. BNP (last 3 results) No results for input(s): PROBNP in the last 8760 hours. HbA1C: No results for input(s): HGBA1C in the last 72 hours. CBG: Recent Labs  Lab 10/30/21 2039 10/31/21 1210 10/31/21 2024 11/02/21 0838  GLUCAP 94 120* 95 80    Lipid Profile: No results for input(s): CHOL, HDL, LDLCALC, TRIG, CHOLHDL, LDLDIRECT in the last 72 hours. Thyroid Function Tests: No results for input(s): TSH, T4TOTAL, FREET4, T3FREE, THYROIDAB in the last 72  hours. Anemia Panel: No results for input(s): VITAMINB12, FOLATE, FERRITIN, TIBC, IRON, RETICCTPCT in the last 72 hours.  Sepsis Labs: No results for input(s): PROCALCITON, LATICACIDVEN in the last 168 hours.   Recent Results (from the past 240 hour(s))  Resp Panel by RT-PCR (Flu A&B, Covid) Nasopharyngeal Swab     Status: None   Collection Time: 10/29/21 11:42 AM   Specimen: Nasopharyngeal Swab; Nasopharyngeal(NP) swabs in vial transport medium  Result Value Ref Range Status   SARS Coronavirus 2 by RT PCR NEGATIVE NEGATIVE Final    Comment: (NOTE) SARS-CoV-2 target nucleic acids are NOT DETECTED.  The SARS-CoV-2 RNA is generally detectable in upper respiratory specimens during the acute phase of infection. The lowest concentration of SARS-CoV-2 viral copies this assay can detect is 138 copies/mL. A negative result does not preclude SARS-Cov-2 infection and should not be used as the sole basis for treatment or other patient management decisions. A negative result may occur with  improper specimen collection/handling, submission of specimen other than nasopharyngeal swab, presence of viral mutation(s) within the areas targeted by this assay, and inadequate number of viral copies(<138 copies/mL). A negative result must be combined with clinical observations, patient history, and epidemiological information. The expected result is Negative.  Fact Sheet for Patients:  BloggerCourse.com  Fact Sheet for Healthcare Providers:  SeriousBroker.it  This test is no t yet approved or cleared by the [place] FDA and  has been authorized for detection and/or diagnosis of SARS-CoV-2 by FDA under an Emergency Use Authorization (EUA). This EUA will remain  in effect (meaning this test can be used) for the duration of the COVID-19 declaration under Section 564(b)(1) of the Act, 21 U.S.C.section 360bbb-3(b)(1), unless the authorization is  terminated  or revoked sooner.       Influenza A by PCR NEGATIVE NEGATIVE Final   Influenza B by PCR NEGATIVE NEGATIVE Final    Comment: (NOTE) The Xpert Xpress SARS-CoV-2/FLU/RSV plus assay is intended as an aid in the diagnosis of influenza from Nasopharyngeal swab specimens and should not be used as a sole basis for treatment. Nasal washings and aspirates are unacceptable for Xpert Xpress SARS-CoV-2/FLU/RSV testing.  Fact Sheet for Patients: BloggerCourse.com  Fact Sheet for Healthcare Providers: SeriousBroker.it  This test is not yet approved or cleared by the [place] FDA and has been authorized for detection and/or diagnosis of SARS-CoV-2 by FDA under an Emergency Use Authorization (EUA). This EUA will remain in effect (meaning this test can be used) for the duration of the COVID-19 declaration under Section 564(b)(1) of the Act, 21 U.S.C. section 360bbb-3(b)(1), unless the authorization  is terminated or revoked.  Performed at [facility], [address] [place], [place] [address]       Radiology Studies: No results found.  Scheduled Meds:  atorvastatin  80 mg Oral Daily   diclofenac Sodium  4 g Topical QID   DULoxetine  30 mg Oral Daily   feeding supplement (KATE FARMS STANDARD 1.4)  325 mL Oral TID BM   folic acid  5 mg Oral Daily   loratadine  10 mg Oral QPM   metoCLOPramide  10 mg Oral TID AC   multivitamin with minerals  1 tablet Oral Daily   permethrin   Topical Once   polyethylene glycol  17 g Oral BID   Vitamin D (Ergocalciferol)  50,000 Units Oral Q7 days   Continuous Infusions:     LOS: 7 days   Time spent: 35 minutes. More than 50% of the time was spent in counseling/coordination of care  Arnetha Courser, MD [facility]  If 7PM-7AM, please contact night-coverage [email or web address]  11/05/2021, 3:01 PM   This record has been created using Manufacturing engineer. Errors have been sought and corrected,but may not always be located. Such creation errors do not reflect on the standard of care.

## 2021-11-05 NOTE — Progress Notes (Signed)
Physical Therapy Treatment Patient Details Name: Tiffany [name] MRN: [id number] DOB: Dec 16, 1961 Today's Date: 11/05/2021   History of Present Illness Tiffany [name] is a 59yoF presenting to hospital 10/30 c progressive weakness, N/V, and multiple falls.  Pt seen in ED at [facility] 10/27 c dizziness, weakness, frequent falls over the preceeding weeks and left AMA. Pt also in ED on 10/21 c bilat hand and foot pain s/p fall at home. Pt admitted with hypokalemia, refractory N/V, frequent falls, and thrombocytopenia.  PMH includes h/o hiatal hernia, Nissen fundoplication, BPD, COPD, cardiomegaly, and abdominal sx. Per fiance, pt has not been able to walk very well for estiamted 6 months to the point where he typically asks her to remain seated on 4WW and mobilize that way. Pt has notes ([facility]) as far back as March 2022 documenting multiple falls 2/2 BLE buckling.    PT Comments    Pt in bed upon entry, significant other at bedside. Pt is hypoactive, flat affect, somnolent, and minimally verbal, will engage if heavily stimulated. Pt assisted with rolling, scooting, supine to/from sitting transitions multiple times to optimize position in bed, and linen changes. At EOB, pt is able to achieve static balance for brief periods but if not engaged constantly, falls asleep and begins to sway requiring minA for recovery and safety. Pt puts forth to participate to best of ability, but given her somnolence is not able to provide much assistance, mostly hight amplitude, ataxic, hypotonic limb movements with poor attention to other parts of body when moving. In supine (for repositioning) pt desaturates quickly to 87-89%, but once upright, this problem is resolved. Updated RN at end of session. Pt in bed, HOPB at 50 degrees, slight trendelenburg to prevent anterior drift of buttock on mattress.     Recommendations for follow up therapy are one component of a multi-disciplinary discharge planning process, led by  the attending physician.  Recommendations may be updated based on patient status, additional functional criteria and insurance authorization.  Follow Up Recommendations  Skilled nursing-short term rehab (<3 hours/day)     Assistance Recommended at Discharge Frequent or constant Supervision/Assistance  Equipment Recommendations  Wheelchair cushion (measurements PT);Wheelchair (measurements PT);Other (comment) (has been using a rollator as a transport chair for months, would very much benefit from [facility] [place].)    Recommendations for Other Services       Precautions / Restrictions Precautions Precautions: Fall     Mobility  Bed Mobility Overal bed mobility: Needs Assistance Bed Mobility: Rolling Rolling: +2 for safety/equipment;Max assist   Supine to sit: Max assist;+2 for physical assistance Sit to supine: Max assist;+2 for physical assistance   General bed mobility comments: able to balance self at EOB, with frequent intermittent minGuard to minA due to heavy somnolence and frequent falling asleep.    Transfers Overall transfer level: Needs assistance Equipment used: None Transfers: Lateral/Scoot Transfers            Lateral/Scoot Transfers: Mod assist General transfer comment: modA for set up of legs, and anterior weight shift, cues for technique,    Ambulation/Gait                 Stairs             Wheelchair Mobility    Modified Rankin (Stroke Patients Only)       Balance  Cognition Arousal/Alertness: Lethargic Behavior During Therapy: Flat affect Overall Cognitive Status: Difficult to assess                                          Exercises Other Exercises Other Exercises: Alternate UE reach and touch x30, shortsitting at EOB Other Exercises: short sitting EOB marching, 1x10 bilat, requires modA of each LW due to weakness Other Exercises: sustained time at  EOB, short sitting, author engaging pt, trying to alert her x6 minutes    General Comments        Pertinent Vitals/Pain Pain Assessment: No/denies pain    Home Living                          Prior Function            PT Goals (current goals can now be found in the care plan section) Acute Rehab PT Goals Patient Stated Goal: regain baseline strength and stop falling PT Goal Formulation: With family Time For Goal Achievement: 11/13/21 Potential to Achieve Goals: Poor Progress towards PT goals: Not progressing toward goals - comment    Frequency    Min 2X/week      PT Plan Current plan remains appropriate;Equipment recommendations need to be updated    Co-evaluation              AM-PAC PT "6 Clicks" Mobility   Outcome Measure  Help needed turning from your back to your side while in a flat bed without using bedrails?: Total Help needed moving from lying on your back to sitting on the side of a flat bed without using bedrails?: Total Help needed moving to and from a bed to a chair (including a wheelchair)?: Total Help needed standing up from a chair using your arms (e.g., wheelchair or bedside chair)?: Total Help needed to walk in hospital room?: Total Help needed climbing 3-5 steps with a railing? : Total 6 Click Score: 6    End of Session   Activity Tolerance: Patient limited by lethargy Patient left: in bed;with call bell/phone within reach;Other (comment) (hob at 50 dgerees) Nurse Communication: Mobility status (bed setting) PT Visit Diagnosis: Unsteadiness on feet (R26.81);Pain;Repeated falls (R29.6);Muscle weakness (generalized) (M62.81);History of falling (Z91.81)     Time: 0768-0881 PT Time Calculation (min) (ACUTE ONLY): 36 min  Charges:  $Therapeutic Activity: 23-37 mins                    3:57 PM, 11/05/21 Rosamaria Lints, PT, DPT Physical Therapist - [facility]  [phone] (ASCOM)     Ashlay Altieri C 11/05/2021, 3:51 PM

## 2021-11-06 DIAGNOSIS — E876 Hypokalemia: Secondary | ICD-10-CM | POA: Diagnosis not present

## 2021-11-06 MED ORDER — MAGNESIUM HYDROXIDE 400 MG/5ML PO SUSP
30.0000 mL | Freq: Every day | ORAL | Status: DC
  Administered 2021-11-06 – 2021-11-08 (×3): 30 mL via ORAL
  Filled 2021-11-06 (×3): qty 30

## 2021-11-06 NOTE — TOC Progression Note (Signed)
Transition of Care [facility]) - Progression Note    Patient Details  Name: Tiffany [name] MRN: [id number] Date of Birth: 1961/12/22  Transition of Care [facility]) CM/SW Contact  Hetty Ely, RN Phone Number: 11/06/2021, 11:20 AM  Clinical Narrative: Burt Ek spoke with [facility] about [facility], she says it is still pending. Attending notified.      Expected Discharge Plan: Skilled Nursing Facility Barriers to Discharge: Continued Medical Work up  Expected Discharge Plan and Services Expected Discharge Plan: Skilled Nursing Facility In-house Referral: NA   Post Acute Care Choice: Skilled Nursing Facility Living arrangements for the past 2 months: Single Family Home                                       Social Determinants of Health (SDOH) Interventions    Readmission Risk Interventions No flowsheet data found.

## 2021-11-06 NOTE — Progress Notes (Signed)
Occupational Therapy Treatment Patient Details Name: Tiffany [name] MRN: [id number] DOB: 1961-10-20 Today's Date: 11/06/2021   History of present illness Tiffany [name] is a 59yoF presenting to hospital 10/30 c progressive weakness, N/V, and multiple falls.  Pt seen in ED at [facility] 10/27 c dizziness, weakness, frequent falls over the preceeding weeks and left AMA. Pt also in ED on 10/21 c bilat hand and foot pain s/p fall at home. Pt admitted with hypokalemia, refractory N/V, frequent falls, and thrombocytopenia.  PMH includes h/o hiatal hernia, Nissen fundoplication, BPD, COPD, cardiomegaly, and abdominal sx. Per fiance, pt has not been able to walk very well for estiamted 6 months to the point where he typically asks her to remain seated on 4WW and mobilize that way. Pt has notes ([facility]) as far back as March 2022 documenting multiple falls 2/2 BLE buckling.   OT comments  Upon entering the room, pt supine in bed and sleeping soundly. Family member present in the room and sleeping soundly as well. Pt is agreeable to OT intervention but remains lethargic throughout session needing mod multimodal cuing to keep eyes open during session. Supine >sit with Max A of 2 to EOB. Pt needing min -max A for static sitting balance ~ 10 minutes secondary to L lateral lean. Attempted standing from bed x2 reps. Pt needing B knees blocked for safety and max A of 2 to clear buttocks. Unable to take steps this session safely. Pt returning to supine with max A of 2 and repositioned in bed for comfort and safety. OT continues to recommend short term rehab at discharge to address functional deficits.    Recommendations for follow up therapy are one component of a multi-disciplinary discharge planning process, led by the attending physician.  Recommendations may be updated based on patient status, additional functional criteria and insurance authorization.    Follow Up Recommendations  Skilled nursing-short term  rehab (<3 hours/day)    Assistance Recommended at Discharge Frequent or constant Supervision/Assistance        Precautions / Restrictions Precautions Precautions: Fall       Mobility Bed Mobility Overal bed mobility: Needs Assistance Bed Mobility: Supine to Sit;Sit to Supine     Supine to sit: Max assist;+2 for physical assistance Sit to supine: Max assist;+2 for physical assistance        Transfers Overall transfer level: Needs assistance Equipment used: 2 person hand held assist   Sit to Stand: Mod assist;Max assist;From elevated surface;+2 physical assistance           General transfer comment: cleared buttocks but unable to take steps safely     Balance Overall balance assessment: Needs assistance Sitting-balance support: Feet supported;Bilateral upper extremity supported Sitting balance-Leahy Scale: Poor Sitting balance - Comments: min - max A for static sitting Postural control: Left lateral lean Standing balance support: Bilateral upper extremity supported;During functional activity Standing balance-Leahy Scale: Zero Standing balance comment: required BUE support                           ADL either performed or assessed with clinical judgement   ADL Overall ADL's : Needs assistance/impaired                     Lower Body Dressing: Minimal assistance Lower Body Dressing Details (indicate cue type and reason): circle sitting in bed to don/doff socks  Vision Patient Visual Report: No change from baseline            Cognition Arousal/Alertness: Lethargic Behavior During Therapy: Flat affect Overall Cognitive Status: Within Functional Limits for tasks assessed                                 General Comments: Pt answers questions appropriately and accurately but needing increased time and min - mod multimodal cuing for safety                     Pertinent Vitals/ Pain       Pain  Assessment: No/denies pain  Home Living Family/patient expects to be discharged to:: Other (Comment)                                            Frequency  Min 2X/week        Progress Toward Goals  OT Goals(current goals can now be found in the care plan section)  Progress towards OT goals: Progressing toward goals  Acute Rehab OT Goals Patient Stated Goal: none stated OT Goal Formulation: Patient unable to participate in goal setting Time For Goal Achievement: 11/13/21 Potential to Achieve Goals: Good  Plan Discharge plan remains appropriate;Frequency remains appropriate       AM-PAC OT "6 Clicks" Daily Activity     Outcome Measure   Help from another person eating meals?: None Help from another person taking care of personal grooming?: A Little Help from another person toileting, which includes using toliet, bedpan, or urinal?: Total Help from another person bathing (including washing, rinsing, drying)?: A Lot Help from another person to put on and taking off regular upper body clothing?: A Lot Help from another person to put on and taking off regular lower body clothing?: A Lot 6 Click Score: 14    End of Session    OT Visit Diagnosis: Unsteadiness on feet (R26.81);Repeated falls (R29.6);History of falling (Z91.81)   Activity Tolerance Patient limited by lethargy   Patient Left in bed;with bed alarm set;with call bell/phone within reach;with family/visitor present   Nurse Communication Mobility status        Time: 1610-9604 OT Time Calculation (min): 27 min  Charges: OT General Charges $OT Visit: 1 Visit OT Treatments $Self Care/Home Management : 8-22 mins $Therapeutic Activity: 8-22 mins  Jackquline Denmark, MS, OTR/L , CBIS ascom [phone]  11/06/21, 12:29 PM

## 2021-11-06 NOTE — Progress Notes (Signed)
PROGRESS NOTE    Tiffany [name]  KGU:[id number] DOB: 08-31-1961 DOA: 10/29/2021 PCP: Eddie Dibbles, MD   Brief Narrative: Taken from prior notes. Tiffany [name] is a 60 y.o. female with medical history significant for morbid obesity with a BMI of 46, history of GERD status post nissen fundoplication, bipolar disorder, COPD, migraines who presents to the ER via EMS for evaluation of weakness and frequent falls. He states that patient has had nausea and vomiting for weeks and has not been able to tolerate any oral intake.   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  She was discharged home in stable condition and placed on twice daily PPI. GI was again consulted during current hospitalization at [facility] and they are recommending continuation of PPI.  She was also found to have hypokalemia, hypophosphatemia and hypomagnesemia, electrolytes are being repleted.  Vitamin D levels were very low at 5.50 and she was started on supplement. UA concerning for UTI, no urine cultures and she completed a course of ceftriaxone.  PT recommended SNF placement.  11/1.  There was some concern of unresponsiveness which occurred suddenly, rapid response was called.  Vitals and CBG within normal limit.  ABG with mild hypercapnia and hypoxia. Giving a trial of BiPAP as she appears little more somnolent.  11/2: Patient was more alert and oriented and sitting in chair.  Having some nausea and vomiting with p.o. intake. Later had an assisted fall to floor due to profound weakness while transferring from chair to bed.  Some complaint of ankle pain but no significant abnormality.  11/3: Rapid response was again called earlier due to decreased responsiveness.  Vitals stable, CBG 80.  ABG with CO2 of 54 and O2 of 66.  Patient again refused to wear BiPAP at  night. 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  11/4; patient had a bed offer, pending insurance authorization.  Subjective: Patient was seen and examined today.  No new complaints.  Eating breakfast.  Still waiting for insurance authorization.  Assessment & Plan:   Principal Problem:   Hypokalemia Active Problems:   Frequent falls   Refractory nausea and vomiting   Thrombocytopenia (HCC)   GERD (gastroesophageal reflux disease)  Generalized weakness/frequent falls.  Had multiple electrolyte derangements and frequent nausea and vomiting can be contributory.  PT is recommending SNF placement.  No focal deficit.  Strength seems within normal limit. No focal deficit -TOC is working on it, had a bed offer at [facility], working on English as a second language teacher.  Increased somnolence/unresponsiveness.  Improved. Quite unusual presentation, can be functional but ABG with mildly elevated CO2 and mild hypoxia.  That can be due to hypoventilation syndrome with morbid obesity.  Frequent calling of rapid response with decreased responsiveness, doubt it is medically significant. Most likely underlying psych issues.  Able to tolerate BiPAP last night. -Continue with BiPAP at night.  Assisted fall.  No significant injury, patient was complaining of left ankle pain without any significant abnormality.  Left ankle and knee imaging was done due to her complaint of pain and it was negative for any acute abnormality.  Electrolyte abnormalities.  Most likely secondary to GI losses and poor p.o. intake.  Hypophosphatemia has been  resolved, continue to have intermittent hypokalemia and hypomagnesemia. -Replete electrolytes as needed. -Continue to monitor  Anemia secondary to folate deficiency.  Hemoglobin stable, iron and B12 within normal limit. -Continue with  supplement  Vitamin D deficiency.  Vitamin D at 5.5.  Can be contributory to frequent falls and weakness. -Continue with supplement-50,000 units weekly for 8 weeks followed by 2000 units daily.  Thrombocytopenia.  Seems chronic and stable. -Continue to monitor  Class III/morbid obesity. Estimated body mass index is 42.75 kg/m as calculated from the following:   Height as of this encounter: 5\' 9"  (1.753 m).   Weight as of this encounter: 131.3 kg.   Objective: Vitals:   11/05/21 2138 11/06/21 0623 11/06/21 0814 11/06/21 1119  BP: (!) 146/77 127/66 (!) 141/78 133/80  Pulse: 93 (!) 103 (!) 102 (!) 104  Resp: 19 16 18 18   Temp: 97.8 F (36.6 C) 98.6 F (37 C) 97.7 F (36.5 C) 98.4 F (36.9 C)  TempSrc: Oral Oral Oral Oral  SpO2: 92% 92% 96% 90%  Weight:      Height:        Intake/Output Summary (Last 24 hours) at 11/06/2021 1335 Last data filed at 11/06/2021 13/06/2021 Gross per 24 hour  Intake --  Output 350 ml  Net -350 ml    Filed Weights   10/29/21 0915 11/02/21 1336  Weight: (!) 143 kg 131.3 kg    Examination: General.  Morbidly obese lady, in no acute distress. Pulmonary.  Lungs clear bilaterally, normal respiratory effort. CV.  Regular rate and rhythm, no JVD, rub or murmur. Abdomen.  Soft, nontender, nondistended, BS positive. CNS.  Alert and oriented .  No focal neurologic deficit. Extremities.  No edema, no cyanosis, pulses intact and symmetrical. Psychiatry.  Judgment and insight appears normal.   DVT prophylaxis: Lovenox Code Status: Full Family Communication:  Disposition Plan:  Status is: Inpatient  Remains inpatient appropriate because: Severity of illness, currently medical stable waiting for insurance authorization for SNF.   Level of care: Med-Surg  All the records are reviewed and case discussed with Care Management/Social Worker. Management plans discussed with the patient, nursing and they are in agreement.  Consultants:  GI  Procedures:   Antimicrobials:   Data Reviewed: I have personally reviewed following labs and imaging studies  CBC: Recent Labs  Lab 10/31/21 0448 11/01/21 0514 11/02/21 0506 11/03/21 0736 11/04/21 0446  WBC 2.8* 2.4* 3.1* 2.9* 3.6*  HGB 10.4* 10.2* 10.1* 10.0* 9.8*  HCT 30.8* 30.0* 31.7* 31.4* 29.7*  MCV 84.6 84.7 84.3 83.1 83.2  PLT 100* 97* 98* 88* 96*    Basic Metabolic Panel: Recent Labs  Lab 10/31/21 0448 11/01/21 0514 11/02/21 0506 11/03/21 0736 11/04/21 0446  NA 135 138 137 136 134*  K 2.9* 3.4* 3.4* 3.6 3.2*  CL 95* 99 97* 96* 93*  CO2 31 32 30 29 31   GLUCOSE 94 89 79 82 101*  BUN 10 9 11 10 16   CREATININE 0.62 0.72 0.59 0.66 0.60  CALCIUM 8.0* 8.1* 8.3* 8.5* 8.4*  MG 1.8 2.0 1.7  --  1.8  PHOS 2.5 2.1* 3.9  --   --     GFR: Estimated Creatinine Clearance: 111.6 mL/min (by C-G formula based on SCr of 0.6 mg/dL). Liver Function Tests: No results for input(s): AST, ALT, ALKPHOS, BILITOT, PROT, ALBUMIN in the last 168 hours.  No results for input(s): LIPASE, AMYLASE in the last 168 hours. No results for input(s): AMMONIA in the last 168 hours. Coagulation  Profile: No results for input(s): INR, PROTIME in the last 168 hours. Cardiac Enzymes: No results for input(s): CKTOTAL, CKMB, CKMBINDEX, TROPONINI in the last 168 hours. BNP (last 3 results) No results for input(s): PROBNP in the last 8760 hours. HbA1C: No results for input(s): HGBA1C in the last 72 hours. CBG: Recent Labs  Lab 10/30/21 2039 10/31/21 1210 10/31/21 2024 11/02/21 0838  GLUCAP 94 120* 95 80    Lipid Profile: No results for input(s): CHOL, HDL, LDLCALC, TRIG, CHOLHDL, LDLDIRECT in the last 72 hours. Thyroid Function Tests: No results for input(s): TSH, T4TOTAL, FREET4, T3FREE, THYROIDAB in the last 72 hours. Anemia Panel: No results for input(s): VITAMINB12, FOLATE, FERRITIN, TIBC, IRON, RETICCTPCT in the last 72 hours.  Sepsis Labs: No results for input(s): PROCALCITON, LATICACIDVEN in  the last 168 hours.   Recent Results (from the past 240 hour(s))  Resp Panel by RT-PCR (Flu A&B, Covid) Nasopharyngeal Swab     Status: None   Collection Time: 10/29/21 11:42 AM   Specimen: Nasopharyngeal Swab; Nasopharyngeal(NP) swabs in vial transport medium  Result Value Ref Range Status   SARS Coronavirus 2 by RT PCR NEGATIVE NEGATIVE Final    Comment: (NOTE) SARS-CoV-2 target nucleic acids are NOT DETECTED.  The SARS-CoV-2 RNA is generally detectable in upper respiratory specimens during the acute phase of infection. The lowest concentration of SARS-CoV-2 viral copies this assay can detect is 138 copies/mL. A negative result does not preclude SARS-Cov-2 infection and should not be used as the sole basis for treatment or other patient management decisions. A negative result may occur with  improper specimen collection/handling, submission of specimen other than nasopharyngeal swab, presence of viral mutation(s) within the areas targeted by this assay, and inadequate number of viral copies(<138 copies/mL). A negative result must be combined with clinical observations, patient history, and epidemiological information. The expected result is Negative.  Fact Sheet for Patients:  BloggerCourse.com  Fact Sheet for Healthcare Providers:  SeriousBroker.it  This test is no t yet approved or cleared by the [place] FDA and  has been authorized for detection and/or diagnosis of SARS-CoV-2 by FDA under an Emergency Use Authorization (EUA). This EUA will remain  in effect (meaning this test can be used) for the duration of the COVID-19 declaration under Section 564(b)(1) of the Act, 21 U.S.C.section 360bbb-3(b)(1), unless the authorization is terminated  or revoked sooner.       Influenza A by PCR NEGATIVE NEGATIVE Final   Influenza B by PCR NEGATIVE NEGATIVE Final    Comment: (NOTE) The Xpert Xpress SARS-CoV-2/FLU/RSV plus  assay is intended as an aid in the diagnosis of influenza from Nasopharyngeal swab specimens and should not be used as a sole basis for treatment. Nasal washings and aspirates are unacceptable for Xpert Xpress SARS-CoV-2/FLU/RSV testing.  Fact Sheet for Patients: BloggerCourse.com  Fact Sheet for Healthcare Providers: SeriousBroker.it  This test is not yet approved or cleared by the [place] FDA and has been authorized for detection and/or diagnosis of SARS-CoV-2 by FDA under an Emergency Use Authorization (EUA). This EUA will remain in effect (meaning this test can be used) for the duration of the COVID-19 declaration under Section 564(b)(1) of the Act, 21 U.S.C. section 360bbb-3(b)(1), unless the authorization is terminated or revoked.  Performed at [facility], [address], [place], [place] [address]       Radiology Studies: No results found.  Scheduled Meds:  atorvastatin  80 mg Oral Daily   diclofenac Sodium  4 g  Topical QID   DULoxetine  30 mg Oral Daily   feeding supplement (KATE FARMS STANDARD 1.4)  325 mL Oral TID BM   folic acid  5 mg Oral Daily   loratadine  10 mg Oral QPM   metoCLOPramide  10 mg Oral TID AC   multivitamin with minerals  1 tablet Oral Daily   permethrin   Topical Once   polyethylene glycol  17 g Oral BID   Vitamin D (Ergocalciferol)  50,000 Units Oral Q7 days   Continuous Infusions:     LOS: 8 days   Time spent: 32 minutes. More than 50% of the time was spent in counseling/coordination of care  Arnetha Courser, MD [facility]  If 7PM-7AM, please contact night-coverage [email or web address]  11/06/2021, 1:35 PM   This record has been created using Conservation officer, historic buildings. Errors have been sought and corrected,but may not always be located. Such creation errors do not reflect on the standard of care.

## 2021-11-07 NOTE — Progress Notes (Signed)
PROGRESS NOTE    Tiffany [name]  FXJ:[id number] DOB: [birth year] DOA: 10/29/2021 PCP: Eddie Dibbles, MD   Brief Narrative: Taken from prior notes. Tiffany [name] is a 60 y.o. female with medical history significant for morbid obesity with a BMI of 46, history of GERD status post nissen fundoplication, bipolar disorder, COPD, migraines who presents to the ER via EMS for evaluation of weakness and frequent falls. He states that patient has had nausea and vomiting for weeks and has not been able to tolerate any oral intake.   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  She was discharged home in stable condition and placed on twice daily PPI. GI was again consulted during current hospitalization at [facility] and they are recommending continuation of PPI.  She was also found to have hypokalemia, hypophosphatemia and hypomagnesemia, electrolytes are being repleted.  Vitamin D levels were very low at 5.50 and she was started on supplement. UA concerning for UTI, no urine cultures and she completed a course of ceftriaxone.  PT recommended SNF placement.  11/1.  There was some concern of unresponsiveness which occurred suddenly, rapid response was called.  Vitals and CBG within normal limit.  ABG with mild hypercapnia and hypoxia. Giving a trial of BiPAP as she appears little more somnolent.  11/2: Patient was more alert and oriented and sitting in chair.  Having some nausea and vomiting with p.o. intake. Later had an assisted fall to floor due to profound weakness while transferring from chair to bed.  Some complaint of ankle pain but no significant abnormality.  11/3: Rapid response was again called earlier due to decreased responsiveness.  Vitals stable, CBG 80.  ABG with CO2 of 54 and O2 of 66.  Patient again refused to wear BiPAP at  night. 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  11/4; patient had a bed offer, pending insurance authorization.  Subjective: Patient was complaining of feeling cold, stating they took away my second blanket.  Significant other at bedside.  No other complaints.  Assessment & Plan:   Principal Problem:   Hypokalemia Active Problems:   Frequent falls   Refractory nausea and vomiting   Thrombocytopenia (HCC)   GERD (gastroesophageal reflux disease)  Generalized weakness/frequent falls.  Had multiple electrolyte derangements and frequent nausea and vomiting can be contributory.  PT is recommending SNF placement.  No focal deficit.  Strength seems within normal limit. No focal deficit -TOC is working on it, had a bed offer at [facility], working on English as a second language teacher.  Increased somnolence/unresponsiveness.  Improved. Quite unusual presentation, can be functional but ABG with mildly elevated CO2 and mild hypoxia.  That can be due to hypoventilation syndrome with morbid obesity.  Frequent calling of rapid response with decreased responsiveness, doubt it is medically significant. Most likely underlying psych issues.  Able to tolerate BiPAP last night. -Continue with BiPAP at night.  Assisted fall.  No significant injury, patient was complaining of left ankle pain without any significant abnormality.  Left ankle and knee imaging was done due to her complaint of pain and it was negative for any acute abnormality.  Electrolyte abnormalities.  Most likely secondary to GI losses and poor p.o. intake.  Hypophosphatemia has been resolved, continue to have intermittent hypokalemia and hypomagnesemia. -Replete electrolytes as needed. -Continue to monitor  Anemia secondary to folate deficiency.  Hemoglobin stable, iron and B12 within normal  limit. -Continue with supplement  Vitamin D deficiency.  Vitamin D at 5.5.  Can be contributory to frequent falls and weakness. -Continue with supplement-50,000 units weekly for 8 weeks followed by 2000 units daily.  Thrombocytopenia.  Seems chronic and stable. -Continue to monitor  Class III/morbid obesity. Estimated body mass index is 42.75 kg/m as calculated from the following:   Height as of this encounter: 5\' 9"  (1.753 m).   Weight as of this encounter: 131.3 kg.   Objective: Vitals:   11/06/21 2040 11/07/21 0459 11/07/21 0858 11/07/21 1005  BP: 120/71 (!) 151/91 (!) 142/84 (!) 145/86  Pulse: (!) 101 85 87 88  Resp: 16 16 18 16   Temp: 98.4 F (36.9 C) 97.8 F (36.6 C) 97.8 F (36.6 C) 98 F (36.7 C)  TempSrc: Axillary Oral  Oral  SpO2: 92% 94% 94% 96%  Weight:      Height:        Intake/Output Summary (Last 24 hours) at 11/07/2021 1520 Last data filed at 11/07/2021 1500 Gross per 24 hour  Intake --  Output 1000 ml  Net -1000 ml    Filed Weights   10/29/21 0915 11/02/21 1336  Weight: (!) 143 kg 131.3 kg    Examination: General.  Morbidly obese lady, in no acute distress. Pulmonary.  Lungs clear bilaterally, normal respiratory effort. CV.  Regular rate and rhythm, no JVD, rub or murmur. Abdomen.  Soft, nontender, nondistended, BS positive. CNS.  Alert and oriented .  No focal neurologic deficit. Extremities.  No edema, no cyanosis, pulses intact and symmetrical. Psychiatry.  Judgment and insight appears normal.   DVT prophylaxis: Lovenox Code Status: Full Family Communication: Discussed with significant other at bedside Disposition Plan:  Status is: Inpatient  Remains inpatient appropriate because: Severity of illness, currently medical stable waiting for insurance authorization for SNF.   Level of care: Med-Surg  All the records are reviewed and case discussed with Care Management/Social Worker. Management plans discussed with the patient, nursing  and they are in agreement.  Consultants:  GI  Procedures:  Antimicrobials:   Data Reviewed: I have personally reviewed following labs and imaging studies  CBC: Recent Labs  Lab 11/01/21 0514 11/02/21 0506 11/03/21 0736 11/04/21 0446  WBC 2.4* 3.1* 2.9* 3.6*  HGB 10.2* 10.1* 10.0* 9.8*  HCT 30.0* 31.7* 31.4* 29.7*  MCV 84.7 84.3 83.1 83.2  PLT 97* 98* 88* 96*    Basic Metabolic Panel: Recent Labs  Lab 11/01/21 0514 11/02/21 0506 11/03/21 0736 11/04/21 0446  NA 138 137 136 134*  K 3.4* 3.4* 3.6 3.2*  CL 99 97* 96* 93*  CO2 32 30 29 31   GLUCOSE 89 79 82 101*  BUN 9 11 10 16   CREATININE 0.72 0.59 0.66 0.60  CALCIUM 8.1* 8.3* 8.5* 8.4*  MG 2.0 1.7  --  1.8  PHOS 2.1* 3.9  --   --     GFR: Estimated Creatinine Clearance: 111.6 mL/min (by C-G formula based on SCr of 0.6 mg/dL). Liver Function Tests: No results for input(s): AST, ALT, ALKPHOS, BILITOT, PROT, ALBUMIN in the last 168 hours.  No results for input(s): LIPASE, AMYLASE in the last 168 hours. No results for input(s): AMMONIA in the last 168 hours. Coagulation Profile: No results for input(s): INR, PROTIME in the last 168 hours.  Cardiac Enzymes: No results for input(s): CKTOTAL, CKMB, CKMBINDEX, TROPONINI in the last 168 hours. BNP (last 3 results) No results for input(s): PROBNP in the last 8760 hours. HbA1C: No results for input(s): HGBA1C in the last 72 hours. CBG: Recent Labs  Lab 10/31/21 2024 11/02/21 0838  GLUCAP 95 80    Lipid Profile: No results for input(s): CHOL, HDL, LDLCALC, TRIG, CHOLHDL, LDLDIRECT in the last 72 hours. Thyroid Function Tests: No results for input(s): TSH, T4TOTAL, FREET4, T3FREE, THYROIDAB in the last 72 hours. Anemia Panel: No results for input(s): VITAMINB12, FOLATE, FERRITIN, TIBC, IRON, RETICCTPCT in the last 72 hours.  Sepsis Labs: No results for input(s): PROCALCITON, LATICACIDVEN in the last 168 hours.   Recent Results (from the past 240 hour(s))   Resp Panel by RT-PCR (Flu A&B, Covid) Nasopharyngeal Swab     Status: None   Collection Time: 10/29/21 11:42 AM   Specimen: Nasopharyngeal Swab; Nasopharyngeal(NP) swabs in vial transport medium  Result Value Ref Range Status   SARS Coronavirus 2 by RT PCR NEGATIVE NEGATIVE Final    Comment: (NOTE) SARS-CoV-2 target nucleic acids are NOT DETECTED.  The SARS-CoV-2 RNA is generally detectable in upper respiratory specimens during the acute phase of infection. The lowest concentration of SARS-CoV-2 viral copies this assay can detect is 138 copies/mL. A negative result does not preclude SARS-Cov-2 infection and should not be used as the sole basis for treatment or other patient management decisions. A negative result may occur with  improper specimen collection/handling, submission of specimen other than nasopharyngeal swab, presence of viral mutation(s) within the areas targeted by this assay, and inadequate number of viral copies(<138 copies/mL). A negative result must be combined with clinical observations, patient history, and epidemiological information. The expected result is Negative.  Fact Sheet for Patients:  BloggerCourse.com  Fact Sheet for Healthcare Providers:  SeriousBroker.it  This test is no t yet approved or cleared by the [place] FDA and  has been authorized for detection and/or diagnosis of SARS-CoV-2 by FDA under an Emergency Use Authorization (EUA). This EUA will remain  in effect (meaning this test can be used) for the duration of the COVID-19 declaration under Section 564(b)(1) of the Act, 21 U.S.C.section 360bbb-3(b)(1), unless the authorization is terminated  or revoked sooner.       Influenza A by PCR NEGATIVE NEGATIVE Final   Influenza B by PCR NEGATIVE NEGATIVE Final    Comment: (NOTE) The Xpert Xpress SARS-CoV-2/FLU/RSV plus assay is intended as an aid in the diagnosis of influenza from  Nasopharyngeal swab specimens and should not be used as a sole basis for treatment. Nasal washings and aspirates are unacceptable for Xpert Xpress SARS-CoV-2/FLU/RSV testing.  Fact Sheet for Patients: BloggerCourse.com  Fact Sheet for Healthcare Providers: SeriousBroker.it  This test is not yet approved or cleared by the [place] FDA and has been authorized for detection and/or diagnosis of SARS-CoV-2 by FDA under an Emergency Use Authorization (EUA). This EUA will remain in effect (meaning this test can be used) for the duration of the COVID-19 declaration under Section 564(b)(1) of the Act, 21 U.S.C. section 360bbb-3(b)(1), unless the authorization is terminated or revoked.  Performed at [facility], [address] [place], [place] [address]       Radiology Studies: No results found.  Scheduled Meds:  atorvastatin  80 mg Oral Daily   diclofenac Sodium  4 g Topical QID   DULoxetine  30 mg Oral Daily   feeding supplement (K[employer] STANDARD 1.4)  325 mL Oral TID BM   folic acid  5 mg Oral Daily   loratadine  10 mg Oral QPM   magnesium hydroxide  30 mL Oral Daily   metoCLOPramide  10 mg Oral TID AC   multivitamin with minerals  1 tablet Oral Daily   permethrin   Topical Once   polyethylene glycol  17 g Oral BID   Vitamin D (Ergocalciferol)  50,000 Units Oral Q7 days   Continuous Infusions:     LOS: 9 days   Time spent: 30 minutes. More than 50% of the time was spent in counseling/coordination of care  Arnetha Courser, MD [facility]  If 7PM-7AM, please contact night-coverage [email or web address]  11/07/2021, 3:20 PM   This record has been created using Conservation officer, historic buildings. Errors have been sought and corrected,but may not always be located. Such creation errors do not reflect on the standard of care.

## 2021-11-07 NOTE — Progress Notes (Signed)
PT Cancellation Note  Patient Details Name: Tiffany [name] MRN: [id number] DOB: [birth year]   Cancelled Treatment:    Reason Eval/Treat Not Completed: Patient at procedure or test/unavailable PT to reassess as able  [employer], SPT

## 2021-11-07 NOTE — Progress Notes (Signed)
Call to help patient with Advanced Directive. Provided the education to patient and brother bedside. Patient completed the document and it was notarized, with hard copy in chart at the desk.

## 2021-11-08 ENCOUNTER — Inpatient Hospital Stay
Admission: EM | Admit: 2021-11-08 | Discharge: 2021-11-15 | DRG: 189 | Disposition: A | Discharge status: Skilled Nursing Facility | Payer: Medicaid Other | Attending: Internal Medicine | Admitting: Internal Medicine

## 2021-11-08 ENCOUNTER — Encounter: Payer: Self-pay | Admitting: *Deleted

## 2021-11-08 ENCOUNTER — Other Ambulatory Visit: Payer: Self-pay

## 2021-11-08 DIAGNOSIS — Z79899 Other long term (current) drug therapy: Secondary | ICD-10-CM

## 2021-11-08 DIAGNOSIS — Z751 Person awaiting admission to adequate facility elsewhere: Secondary | ICD-10-CM

## 2021-11-08 DIAGNOSIS — K829 Disease of gallbladder, unspecified: Secondary | ICD-10-CM

## 2021-11-08 DIAGNOSIS — F319 Bipolar disorder, unspecified: Secondary | ICD-10-CM | POA: Diagnosis present

## 2021-11-08 DIAGNOSIS — E8729 Other acidosis: Secondary | ICD-10-CM | POA: Diagnosis present

## 2021-11-08 DIAGNOSIS — E871 Hypo-osmolality and hyponatremia: Secondary | ICD-10-CM | POA: Diagnosis present

## 2021-11-08 DIAGNOSIS — J9601 Acute respiratory failure with hypoxia: Principal | ICD-10-CM | POA: Diagnosis present

## 2021-11-08 DIAGNOSIS — Z20822 Contact with and (suspected) exposure to covid-19: Secondary | ICD-10-CM | POA: Diagnosis present

## 2021-11-08 DIAGNOSIS — Z8249 Family history of ischemic heart disease and other diseases of the circulatory system: Secondary | ICD-10-CM

## 2021-11-08 DIAGNOSIS — R262 Difficulty in walking, not elsewhere classified: Secondary | ICD-10-CM

## 2021-11-08 DIAGNOSIS — J441 Chronic obstructive pulmonary disease with (acute) exacerbation: Secondary | ICD-10-CM | POA: Diagnosis present

## 2021-11-08 DIAGNOSIS — Z91199 Patient's noncompliance with other medical treatment and regimen due to unspecified reason: Secondary | ICD-10-CM

## 2021-11-08 DIAGNOSIS — E785 Hyperlipidemia, unspecified: Secondary | ICD-10-CM | POA: Diagnosis present

## 2021-11-08 DIAGNOSIS — R296 Repeated falls: Secondary | ICD-10-CM | POA: Diagnosis present

## 2021-11-08 DIAGNOSIS — Z91011 Allergy to milk products: Secondary | ICD-10-CM

## 2021-11-08 DIAGNOSIS — Z6841 Body Mass Index (BMI) 40.0 and over, adult: Secondary | ICD-10-CM

## 2021-11-08 DIAGNOSIS — G43909 Migraine, unspecified, not intractable, without status migrainosus: Secondary | ICD-10-CM | POA: Diagnosis present

## 2021-11-08 DIAGNOSIS — I10 Essential (primary) hypertension: Secondary | ICD-10-CM

## 2021-11-08 DIAGNOSIS — J9602 Acute respiratory failure with hypercapnia: Secondary | ICD-10-CM | POA: Diagnosis present

## 2021-11-08 DIAGNOSIS — R4182 Altered mental status, unspecified: Secondary | ICD-10-CM

## 2021-11-08 DIAGNOSIS — K219 Gastro-esophageal reflux disease without esophagitis: Secondary | ICD-10-CM | POA: Diagnosis present

## 2021-11-08 DIAGNOSIS — Z91012 Allergy to eggs: Secondary | ICD-10-CM

## 2021-11-08 DIAGNOSIS — G9341 Metabolic encephalopathy: Secondary | ICD-10-CM | POA: Diagnosis present

## 2021-11-08 DIAGNOSIS — E876 Hypokalemia: Secondary | ICD-10-CM | POA: Diagnosis not present

## 2021-11-08 DIAGNOSIS — E878 Other disorders of electrolyte and fluid balance, not elsewhere classified: Secondary | ICD-10-CM | POA: Diagnosis present

## 2021-11-08 DIAGNOSIS — E662 Morbid (severe) obesity with alveolar hypoventilation: Secondary | ICD-10-CM | POA: Diagnosis present

## 2021-11-08 DIAGNOSIS — G9340 Encephalopathy, unspecified: Secondary | ICD-10-CM | POA: Diagnosis present

## 2021-11-08 DIAGNOSIS — R0902 Hypoxemia: Secondary | ICD-10-CM

## 2021-11-08 DIAGNOSIS — Z7951 Long term (current) use of inhaled steroids: Secondary | ICD-10-CM

## 2021-11-08 LAB — BASIC METABOLIC PANEL
Anion gap: 9 (ref 5–15)
BUN: 13 mg/dL (ref 6–20)
CO2: 33 mmol/L — ABNORMAL HIGH (ref 22–32)
Calcium: 8.8 mg/dL — ABNORMAL LOW (ref 8.9–10.3)
Chloride: 90 mmol/L — ABNORMAL LOW (ref 98–111)
Creatinine, Ser: 0.61 mg/dL (ref 0.44–1.00)
GFR, Estimated: >60 mL/min (ref >60)
Glucose, Bld: 88 mg/dL (ref 70–99)
Potassium: 3.2 mmol/L — ABNORMAL LOW (ref 3.5–5.1)
Sodium: 132 mmol/L — ABNORMAL LOW (ref 135–145)

## 2021-11-08 LAB — CBC
HCT: 30.8 % — ABNORMAL LOW (ref 36.0–46.0)
Hemoglobin: 9.9 g/dL — ABNORMAL LOW (ref 12.0–15.0)
MCH: 27 pg (ref 26.0–34.0)
MCHC: 32.1 g/dL (ref 30.0–36.0)
MCV: 83.9 fL (ref 80.0–100.0)
Platelets: 133 10*3/uL — ABNORMAL LOW (ref 150–400)
RBC: 3.67 MIL/uL — ABNORMAL LOW (ref 3.87–5.11)
RDW: 14.6 % (ref 11.5–15.5)
WBC: 3.9 10*3/uL — ABNORMAL LOW (ref 4.0–10.5)
nRBC: 1 % — ABNORMAL HIGH (ref 0.0–0.2)

## 2021-11-08 MED ORDER — SUMATRIPTAN SUCCINATE 50 MG PO TABS
100.0000 mg | ORAL_TABLET | Freq: Every day | ORAL | Status: DC | PRN
  Administered 2021-11-13 – 2021-11-14 (×2): 100 mg via ORAL
  Filled 2021-11-08 (×4): qty 2

## 2021-11-08 MED ORDER — PANTOPRAZOLE SODIUM 40 MG PO TBEC
40.0000 mg | DELAYED_RELEASE_TABLET | Freq: Two times a day (BID) | ORAL | Status: DC
  Administered 2021-11-09 – 2021-11-14 (×10): 40 mg via ORAL
  Filled 2021-11-08 (×10): qty 1

## 2021-11-08 MED ORDER — HYDROCODONE-ACETAMINOPHEN 5-325 MG PO TABS
1.0000 | ORAL_TABLET | Freq: Four times a day (QID) | ORAL | 0 refills | Status: AC | PRN
Start: 2021-11-08 — End: 2021-11-11

## 2021-11-08 MED ORDER — LORATADINE 10 MG PO TABS: 10.0000 mg | ORAL_TABLET | Freq: Every evening | ORAL | Status: DC

## 2021-11-08 MED ORDER — FOLIC ACID 1 MG PO TABS
5.0000 mg | ORAL_TABLET | Freq: Every day | ORAL | Status: DC
Start: 2021-11-09 — End: 2022-03-16

## 2021-11-08 MED ORDER — KATE FARMS STANDARD 1.4 PO LIQD
325.0000 mL | Freq: Three times a day (TID) | ORAL | Status: DC
  Administered 2021-11-10 – 2021-11-14 (×8): 325 mL via ORAL
  Filled 2021-11-08: qty 325

## 2021-11-08 MED ORDER — TRAZODONE HCL 100 MG PO TABS
100.0000 mg | ORAL_TABLET | Freq: Every evening | ORAL | Status: DC | PRN
  Administered 2021-11-14: 100 mg via ORAL
  Filled 2021-11-08: qty 1

## 2021-11-08 MED ORDER — TIOTROPIUM BROMIDE MONOHYDRATE 18 MCG IN CAPS
1.0000 | ORAL_CAPSULE | Freq: Every day | RESPIRATORY_TRACT | Status: DC
  Administered 2021-11-09 – 2021-11-13 (×3): 18 ug via RESPIRATORY_TRACT
  Filled 2021-11-08 (×2): qty 5

## 2021-11-08 MED ORDER — DIPHENHYDRAMINE HCL 25 MG PO CAPS: 25.0000 mg | ORAL_CAPSULE | ORAL | 0 refills | Status: DC | PRN

## 2021-11-08 MED ORDER — DULOXETINE HCL 30 MG PO CPEP
30.0000 mg | ORAL_CAPSULE | Freq: Every day | ORAL | Status: DC
  Administered 2021-11-09 – 2021-11-14 (×5): 30 mg via ORAL
  Filled 2021-11-08 (×8): qty 1

## 2021-11-08 MED ORDER — ACETAMINOPHEN 325 MG PO TABS: 650.0000 mg | ORAL_TABLET | Freq: Four times a day (QID) | ORAL | Status: DC | PRN

## 2021-11-08 MED ORDER — METOCLOPRAMIDE HCL 10 MG PO TABS: 10.0000 mg | ORAL_TABLET | Freq: Three times a day (TID) | ORAL | Status: DC

## 2021-11-08 MED ORDER — ADULT MULTIVITAMIN W/MINERALS CH
1.0000 | ORAL_TABLET | Freq: Every day | ORAL | Status: DC
  Administered 2021-11-09 – 2021-11-14 (×5): 1 via ORAL
  Filled 2021-11-08 (×5): qty 1

## 2021-11-08 MED ORDER — FLUTICASONE PROPIONATE HFA 110 MCG/ACT IN AERO: 2.0000 | INHALATION_SPRAY | Freq: Two times a day (BID) | RESPIRATORY_TRACT | Status: DC

## 2021-11-08 MED ORDER — DICLOFENAC SODIUM 1 % EX GEL: 4.0000 g | Freq: Four times a day (QID) | CUTANEOUS | Status: DC

## 2021-11-08 MED ORDER — POLYETHYLENE GLYCOL 3350 17 G PO PACK: 17.0000 g | PACK | Freq: Two times a day (BID) | ORAL | 0 refills | Status: DC

## 2021-11-08 MED ORDER — METOCLOPRAMIDE HCL 10 MG PO TABS
10.0000 mg | ORAL_TABLET | Freq: Three times a day (TID) | ORAL | Status: DC
  Administered 2021-11-09 – 2021-11-15 (×15): 10 mg via ORAL
  Filled 2021-11-08 (×15): qty 1

## 2021-11-08 MED ORDER — VITAMIN D (ERGOCALCIFEROL) 1.25 MG (50000 UNIT) PO CAPS
50000.0000 [IU] | ORAL_CAPSULE | ORAL | Status: DC
Start: 2021-11-13 — End: 2021-11-15
  Administered 2021-11-12: 50000 [IU] via ORAL
  Filled 2021-11-08 (×2): qty 1

## 2021-11-08 MED ORDER — KATE FARMS STANDARD 1.4 PO LIQD: 325.0000 mL | Freq: Three times a day (TID) | ORAL | Status: AC

## 2021-11-08 MED ORDER — ATORVASTATIN CALCIUM 20 MG PO TABS
80.0000 mg | ORAL_TABLET | Freq: Every day | ORAL | Status: DC
  Administered 2021-11-09 – 2021-11-14 (×5): 80 mg via ORAL
  Filled 2021-11-08 (×5): qty 4

## 2021-11-08 MED ORDER — ACETAMINOPHEN 325 MG PO TABS
650.0000 mg | ORAL_TABLET | Freq: Four times a day (QID) | ORAL | Status: DC | PRN
  Administered 2021-11-09: 650 mg via ORAL
  Filled 2021-11-08 (×3): qty 2

## 2021-11-08 MED ORDER — POLYETHYLENE GLYCOL 3350 17 G PO PACK
17.0000 g | PACK | Freq: Two times a day (BID) | ORAL | Status: DC
  Administered 2021-11-10 – 2021-11-14 (×6): 17 g via ORAL
  Filled 2021-11-08 (×9): qty 1

## 2021-11-08 MED ORDER — FOLIC ACID 1 MG PO TABS
5.0000 mg | ORAL_TABLET | Freq: Every day | ORAL | Status: DC
  Administered 2021-11-09 – 2021-11-14 (×5): 5 mg via ORAL
  Filled 2021-11-08 (×5): qty 5

## 2021-11-08 MED ORDER — LORATADINE 10 MG PO TABS
10.0000 mg | ORAL_TABLET | Freq: Every evening | ORAL | Status: DC
  Administered 2021-11-09 – 2021-11-14 (×6): 10 mg via ORAL
  Filled 2021-11-08 (×6): qty 1

## 2021-11-08 MED ORDER — ADULT MULTIVITAMIN W/MINERALS CH: 1.0000 | ORAL_TABLET | Freq: Every day | ORAL | Status: AC

## 2021-11-08 MED ORDER — VITAMIN D (ERGOCALCIFEROL) 1.25 MG (50000 UNIT) PO CAPS: 50000.0000 [IU] | ORAL_CAPSULE | ORAL | Status: DC

## 2021-11-08 MED ORDER — TRAZODONE HCL 100 MG PO TABS: 100.0000 mg | ORAL_TABLET | Freq: Every evening | ORAL | Status: DC | PRN

## 2021-11-08 NOTE — Discharge Summary (Signed)
Tiffany [name] CWU:[id number] DOB: 03-May-1961 DOA: 10/29/2021  PCP: Eddie Dibbles, MD  Admit date: 10/29/2021 Discharge date: 11/08/2021  Admitted From: home Disposition:  SNF  Recommendations for Outpatient Follow-up:  Follow up with PCP in 1 week Please obtain BMP/CBC in one week    Discharge Condition:Stable CODE STATUS:Full  Diet recommendation: Heart Healthy  Brief/Interim Summary: Per TUU:EKCMKL Tiffany [name] is a 60 y.o. female with medical history significant for morbid obesity with a BMI of 46, history of GERD status post nissen fundoplication, bipolar disorder, COPD, migraines who presents to the ER via EMS for evaluation of weakness and frequent falls. He states that patient has had nausea and vomiting for weeks and has not been able to tolerate any oral intake.   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  She was discharged home in stable condition and placed on twice daily PPI.Patient was seen in the emergency room at [facility] on 10/27 and they left AGAINST MEDICAL ADVICE. According to her significant other she has had multiple falls at home and is very weak and so he brought her back to the ER for further evaluation.GI was again consulted during current hospitalization at [facility] and they are recommending continuation of PPI.  She was also found to have hypokalemia, hypophosphatemia and hypomagnesemia, electrolytes are being repleted.  Vitamin D levels were very low . Electrolytes was supplemented. UA concerning for UTI, no urine cultures and she completed a course of ceftriaxone. Also during her hospitalization it was thought she was having hypoventilation syndrome due to morbid obesity due to decreased responsiveness where rapid response had to be called and her CO2 on ABG was found to be elevated. She was refusing to wear  BiPap at night. PT recommended SNF. She is stable to be discharged to SNF today.   Generalized weakness/frequent falls.  Had multiple electrolyte derangements and frequent nausea and vomiting can be contributory.  PT is recommending SNF placement.  No focal deficit.      Increased somnolence/unresponsiveness.  Improved. Quite unusual presentation, can be functional but ABG with mildly elevated CO2 and mild hypoxia.  That can be due to hypoventilation syndrome with morbid obesity . Most likely underlying psych issues.   Able to tolerate BiPAP last night. -Continue with BiPAP at night. Will need to f/u with pcp    Assisted fall.  No significant injury, patient was complaining of left ankle pain without any significant abnormality.  Left ankle and knee imaging was done due to her complaint of pain and it was negative for any acute abnormality.   Electrolyte abnormalities.  Most likely secondary to GI losses and poor p.o. intake.  Resolved after replacement. 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  Hemoglobin stable, iron and B12 within normal limit. -Continue with supplement   Vitamin D deficiency.  Vitamin D at 5.5.  Can be contributory to frequent falls and weakness. -Continue with supplement-50,000 units weekly for 8 weeks followed by 2000 units daily. Will need to recheck level as outpatient   Thrombocytopenia.  Seems chronic and stable. -Continue to monitor as outpatient   Class III/morbid obesity Complicates overall prognosis  Discharge Diagnoses:  Principal Problem:   Hypokalemia Active Problems:   Frequent falls   Refractory nausea and vomiting   Thrombocytopenia (HCC)  GERD (gastroesophageal reflux disease)    Discharge Instructions  Discharge Instructions     Call MD for:  temperature >100.4   Complete by: As directed    Diet - low sodium heart healthy   Complete by: As directed    Increase  activity slowly   Complete by: As directed       Allergies as of 11/08/2021       Reactions   Dairy Aid [tilactase] Other (See Comments)   Exacerbates asthma   Egg [eggs Or Egg-derived Products] Other (See Comments)   Exacerbated asthma   Milk-related Compounds         Medication List     STOP taking these medications    gi cocktail Susp suspension   levocetirizine 5 MG tablet Commonly known as: XYZAL   naproxen 500 MG tablet Commonly known as: Naprosyn   prochlorperazine 10 MG tablet Commonly known as: COMPAZINE   promethazine 25 MG suppository Commonly known as: PHENERGAN   sucralfate 1 g tablet Commonly known as: Carafate   traMADol 50 MG tablet Commonly known as: Ultram       TAKE these medications    acetaminophen 325 MG tablet Commonly known as: TYLENOL Take 2 tablets (650 mg total) by mouth every 6 (six) hours as needed for mild pain (or Fever >/= 101).   atorvastatin 80 MG tablet Commonly known as: LIPITOR Take 80 mg by mouth daily.   diclofenac Sodium 1 % Gel Commonly known as: VOLTAREN Apply 4 g topically 4 (four) times daily.   diphenhydrAMINE 25 mg capsule Commonly known as: BENADRYL Take 1 capsule (25 mg total) by mouth every 4 (four) hours as needed for itching.   DULoxetine 30 MG capsule Commonly known as: CYMBALTA Take 30 mg by mouth daily.   feeding supplement (KATE FARMS STANDARD 1.4) Liqd liquid Take 325 mLs by mouth 3 (three) times daily between meals.   Flovent HFA 110 MCG/ACT inhaler Generic drug: fluticasone Inhale 2 puffs into the lungs 2 (two) times daily.   folic acid 1 MG tablet Commonly known as: FOLVITE Take 5 tablets (5 mg total) by mouth daily. Start taking on: November 09, 2021   HYDROcodone-acetaminophen 5-325 MG tablet Commonly known as: NORCO/VICODIN Take 1 tablet by mouth every 6 (six) hours as needed for up to 3 days for severe pain or moderate pain.   loratadine 10 MG tablet Commonly known as:  CLARITIN Take 1 tablet (10 mg total) by mouth every evening.   metoCLOPramide 10 MG tablet Commonly known as: REGLAN Take 1 tablet (10 mg total) by mouth 3 (three) times daily before meals. What changed: when to take this   multivitamin with minerals Tabs tablet Take 1 tablet by mouth daily. Start taking on: November 09, 2021   pantoprazole 40 MG tablet Commonly known as: PROTONIX Take 40 mg by mouth 2 (two) times daily.   permethrin 5 % cream Commonly known as: ELIMITE Thoroughly massage cream (using about half the tube) from head to soles of feet; leave on for 8 to 14 hours before removing (shower or bath);   polyethylene glycol 17 g packet Commonly known as: MIRALAX / GLYCOLAX Take 17 g by mouth 2 (two) times daily.   Spiriva HandiHaler 18 MCG inhalation capsule Generic drug: tiotropium 1 capsule at bedtime.   SUMAtriptan 100 MG tablet Commonly known as: IMITREX Take 100 mg by mouth daily as needed.   traZODone 100 MG tablet Commonly known as: DESYREL Take 1 tablet (100 mg  total) by mouth at bedtime as needed for sleep. What changed:  how much to take reasons to take this   Vitamin D (Ergocalciferol) 1.25 MG (50000 UNIT) Caps capsule Commonly known as: DRISDOL Take 1 capsule (50,000 Units total) by mouth every 7 (seven) days. Start taking on: November 13, 2021        Contact information for follow-up providers     Eddie Dibbles, MD Follow up in 1 week(s).   Specialty: Family Medicine Contact information: [address] [place] [address] [phone]              Contact information for after-discharge care     Destination     [facility] .   Service: Skilled Nursing Contact information: [address] [place] [address] [phone]                    Allergies  Allergen Reactions   Dairy Aid [Tilactase] Other (See Comments)    Exacerbates asthma   Egg [Eggs Or  Egg-Derived Products] Other (See Comments)    Exacerbated asthma   Milk-Related Compounds     Consultations: GI   Procedures/Studies: DG Knee 1-2 Views Left  Result Date: 11/01/2021 CLINICAL DATA:  Status post fall. EXAM: LEFT KNEE - 1-2 VIEW COMPARISON:  None. FINDINGS: No evidence of fracture, dislocation, or joint effusion. No evidence of arthropathy or other focal bone abnormality. Soft tissues are unremarkable. IMPRESSION: Negative. Electronically Signed   By: Aram Candela M.D.   On: 11/01/2021 19:56   DG Ankle 2 Views Left  Result Date: 11/01/2021 CLINICAL DATA:  Status post fall. EXAM: LEFT ANKLE - 2 VIEW COMPARISON:  None. FINDINGS: There is no evidence of fracture, dislocation, or joint effusion. There is no evidence of arthropathy or other focal bone abnormality. Mild diffuse soft tissue swelling is seen. IMPRESSION: Mild diffuse soft tissue swelling without an acute osseous abnormality. Electronically Signed   By: Aram Candela M.D.   On: 11/01/2021 20:00   CT Head Wo Contrast  Result Date: 10/29/2021 CLINICAL DATA:  Altered mental status EXAM: CT HEAD WITHOUT CONTRAST TECHNIQUE: Contiguous axial images were obtained from the base of the skull through the vertex without intravenous contrast. COMPARISON:  10/26/2021 FINDINGS: Brain: There is no evidence of recent bleeding within the cranium. Ventricles are not dilated. There is no shift of midline structures. Vascular: Unremarkable Skull: No fracture is seen in calvarium. Sinuses/Orbits: Unremarkable Other: There is low-density in enlarged sella suggesting empty sella. IMPRESSION: No acute intracranial findings are seen. There are no signs of bleeding within the cranium. Empty sella.No significant interval changes are noted since 10/26/2021. Electronically Signed   By: Ernie Avena M.D.   On: 10/29/2021 14:55   CT Head Wo Contrast  Result Date: 10/26/2021 CLINICAL DATA:  Dizziness, non-specific frequent falls EXAM:  CT HEAD WITHOUT CONTRAST TECHNIQUE: Contiguous axial images were obtained from the base of the skull through the vertex without intravenous contrast. COMPARISON:  None. FINDINGS: Brain: No evidence of acute large vascular territory infarction, hemorrhage, hydrocephalus, extra-axial collection or mass lesion/mass effect. Partially empty and expanded sella. Vascular: No hyperdense vessel identified. Mild calcific atherosclerosis. Skull: No acute fracture. Sinuses/Orbits: Visualized sinuses are clear.  Unremarkable orbits. Other: No mastoid effusions. IMPRESSION: 1. No evidence of acute intracranial abnormality. 2. Partially empty and expanded sella, which is often a normal anatomic variant but can be associated with idiopathic intracranial hypertension. Electronically Signed  By: Feliberto Harts M.D.   On: 10/26/2021 15:11   CT CERVICAL SPINE WO CONTRAST  Result Date: 10/29/2021 CLINICAL DATA:  Trauma, fall EXAM: CT CERVICAL SPINE WITHOUT CONTRAST TECHNIQUE: Multidetector CT imaging of the cervical spine was performed without intravenous contrast. Multiplanar CT image reconstructions were also generated. COMPARISON:  11/11/2020 FINDINGS: Examination is less than optimal due to the patient's body habitus. Alignment: Alignment of posterior margins of vertebral bodies is unremarkable. There is straightening of lordosis. Skull base and vertebrae: Unremarkable Soft tissues and spinal canal: Degenerative changes are noted with disc space narrowing, bony spurs and facet hypertrophy. There is mild spinal stenosis at C5-C6 and C6-C7 levels. There is encroachment of neural foramina at multiple levels, more so on the left side at C5-C6 level. Prevertebral soft tissues are unremarkable. Disc levels:  As described above Upper chest: Unremarkable Other: None IMPRESSION: No recent fracture is seen. Alignment of posterior margins of the vertebral bodies is unremarkable. Cervical spondylosis. No significant interval changes  are noted. Electronically Signed   By: Ernie Avena M.D.   On: 10/29/2021 15:04   DG Hand 2 View Right  Result Date: 10/20/2021 CLINICAL DATA:  Fall EXAM: RIGHT HAND - 2 VIEW COMPARISON:  None. FINDINGS: There is no evidence of fracture or dislocation. There is no evidence of arthropathy or other focal bone abnormality. Soft tissues are unremarkable. IMPRESSION: Negative. Electronically Signed   By: Marlan Palau M.D.   On: 10/20/2021 10:22   DG Hand 2 View Left  Result Date: 10/20/2021 CLINICAL DATA:  Fall EXAM: LEFT HAND - 2 VIEW COMPARISON:  None. FINDINGS: There is no evidence of fracture or dislocation. There is no evidence of arthropathy or other focal bone abnormality. Soft tissues are unremarkable. IMPRESSION: Negative. Electronically Signed   By: Marlan Palau M.D.   On: 10/20/2021 10:23   DG Foot 2 Views Left  Result Date: 10/20/2021 CLINICAL DATA:  Fall EXAM: LEFT FOOT - 2 VIEW COMPARISON:  None. FINDINGS: Negative for fracture or bone lesion. Degenerative change in the first MTP IMPRESSION: Negative for fracture. Electronically Signed   By: Marlan Palau M.D.   On: 10/20/2021 10:24   DG Foot 2 Views Right  Result Date: 10/20/2021 CLINICAL DATA:  Fall EXAM: RIGHT FOOT - 2 VIEW COMPARISON:  None. FINDINGS: There is no evidence of fracture or dislocation. There is no evidence of arthropathy or other focal bone abnormality. Soft tissues are unremarkable. IMPRESSION: Negative. Electronically Signed   By: Marlan Palau M.D.   On: 10/20/2021 10:24      Subjective: No complaints this am  Discharge Exam: Vitals:   11/08/21 0458 11/08/21 0827  BP: 122/74 135/87  Pulse: (!) 101 (!) 104  Resp: 16 18  Temp: 98.2 F (36.8 C) 98.4 F (36.9 C)  SpO2: 92% 95%   Vitals:   11/07/21 1700 11/07/21 2053 11/08/21 0458 11/08/21 0827  BP: (!) 144/82 130/75 122/74 135/87  Pulse: 90 (!) 107 (!) 101 (!) 104  Resp: Temp: 98.6 F (37 C) 98.1 F (36.7 C) 98.2 F  (36.8 C) 98.4 F (36.9 C)  TempSrc: Oral  Oral Oral  SpO2: 94% 94% 92% 95%  Weight:      Height:        General: Pt is alert, awake, not in acute distress Cardiovascular: RRR, S1/S2 +, no rubs, no gallops Respiratory: CTA bilaterally, no wheezing, no rhonchi Abdominal: Soft, NT, ND, bowel sounds + Extremities: no edema  The results of significant diagnostics from this hospitalization (including imaging, microbiology, ancillary and laboratory) are listed below for reference.     Microbiology: No results found for this or any previous visit (from the past 240 hour(s)).   Labs: BNP (last 3 results) No results for input(s): BNP in the last 8760 hours. Basic Metabolic Panel: Recent Labs  Lab 11/02/21 0506 11/03/21 0736 11/04/21 0446  NA 137 136 134*  K 3.4* 3.6 3.2*  CL 97* 96* 93*  CO2 30 29 31   GLUCOSE 79 82 101*  BUN 11 10 16   CREATININE 0.59 0.66 0.60  CALCIUM 8.3* 8.5* 8.4*  MG 1.7  --  1.8  PHOS 3.9  --   --    Liver Function Tests: No results for input(s): AST, ALT, ALKPHOS, BILITOT, PROT, ALBUMIN in the last 168 hours. No results for input(s): LIPASE, AMYLASE in the last 168 hours. No results for input(s): AMMONIA in the last 168 hours. CBC: Recent Labs  Lab 11/02/21 0506 11/03/21 0736 11/04/21 0446  WBC 3.1* 2.9* 3.6*  HGB 10.1* 10.0* 9.8*  HCT 31.7* 31.4* 29.7*  MCV 84.3 83.1 83.2  PLT 98* 88* 96*   Cardiac Enzymes: No results for input(s): CKTOTAL, CKMB, CKMBINDEX, TROPONINI in the last 168 hours. BNP: Invalid input(s): POCBNP CBG: Recent Labs  Lab 11/02/21 0838  GLUCAP 80   D-Dimer No results for input(s): DDIMER in the last 72 hours. Hgb A1c No results for input(s): HGBA1C in the last 72 hours. Lipid Profile No results for input(s): CHOL, HDL, LDLCALC, TRIG, CHOLHDL, LDLDIRECT in the last 72 hours. Thyroid function studies No results for input(s): TSH, T4TOTAL, T3FREE, THYROIDAB in the last 72 hours.  Invalid input(s):  FREET3 Anemia work up No results for input(s): VITAMINB12, FOLATE, FERRITIN, TIBC, IRON, RETICCTPCT in the last 72 hours. Urinalysis    Component Value Date/Time   COLORURINE AMBER (A) 10/29/2021 1142   APPEARANCEUR HAZY (A) 10/29/2021 1142   APPEARANCEUR Clear 05/14/2013 1809   LABSPEC 1.012 10/29/2021 1142   LABSPEC 1.013 05/14/2013 1809   PHURINE 7.0 10/29/2021 1142   GLUCOSEU NEGATIVE 10/29/2021 1142   GLUCOSEU Negative 05/14/2013 1809   HGBUR LARGE (A) 10/29/2021 1142   BILIRUBINUR NEGATIVE 10/29/2021 1142   BILIRUBINUR Negative 05/14/2013 1809   KETONESUR 20 (A) 10/29/2021 1142   PROTEINUR 30 (A) 10/29/2021 1142   NITRITE NEGATIVE 10/29/2021 1142   LEUKOCYTESUR MODERATE (A) 10/29/2021 1142   LEUKOCYTESUR Negative 05/14/2013 1809   Sepsis Labs Invalid input(s): PROCALCITONIN,  WBC,  LACTICIDVEN Microbiology No results found for this or any previous visit (from the past 240 hour(s)).   Time coordinating discharge: Over 30 minutes  SIGNED:   10/31/2021, MD  [facility] 11/08/2021, 11:05 AM Pager   If 7PM-7AM, please contact night-coverage [email or web address] Password [email or web address]

## 2021-11-08 NOTE — ED Notes (Addendum)
Pt continues to sleep at this time.  Pt slightly opens eyes to voice but then goes back to sleep.  Even chest rise and fall noted.  NAD noted at this time.

## 2021-11-08 NOTE — Progress Notes (Signed)
Physical Therapy Treatment Patient Details Name: Tiffany [name] MRN: [id number] DOB: 1961-09-01 Today's Date: 11/08/2021   History of Present Illness Tiffany [name] is a 59yoF presenting to hospital 10/30 c progressive weakness, N/V, and multiple falls.  Pt seen in ED at [facility] 10/27 c dizziness, weakness, frequent falls over the preceeding weeks and left AMA. Pt also in ED on 10/21 c bilat hand and foot pain s/p fall at home. Pt admitted with hypokalemia, refractory N/V, frequent falls, and thrombocytopenia.  PMH includes h/o hiatal hernia, Nissen fundoplication, BPD, COPD, cardiomegaly, and abdominal sx. Per fiance, pt has not been able to walk very well for estiamted 6 months to the point where he typically asks her to remain seated on 4WW and mobilize that way. Pt has notes ([facility]) as far back as March 2022 documenting multiple falls 2/2 BLE buckling.    PT Comments    Patient received in bed, significant other present. Patient is alert, agrees to PT/OT. Wants to get up. Requires cues and mod assist for bed mobility ( supine to sit) mod +2 for rolling. Patient performed 2 STS with max +2 assist. Requires rocking to get momentum to stand. Patient let go of walker on first attempt, then on second attempt her legs buckled and she was unable to return to sitting on bed. Was lowered to floor with assistance and hoyer lifted back to bed. Patient without complaints or injury. She will continue to benefit from skilled PT while here to improve strength and functional independence.       Recommendations for follow up therapy are one component of a multi-disciplinary discharge planning process, led by the attending physician.  Recommendations may be updated based on patient status, additional functional criteria and insurance authorization.  Follow Up Recommendations  Skilled nursing-short term rehab (<3 hours/day)     Assistance Recommended at Discharge Frequent or constant  Supervision/Assistance  Equipment Recommendations  Other (comment) (TBD)    Recommendations for Other Services       Precautions / Restrictions Precautions Precautions: Fall Restrictions Weight Bearing Restrictions: No     Mobility  Bed Mobility Overal bed mobility: Needs Assistance Bed Mobility: Supine to Sit Rolling: +2 for physical assistance;Mod assist   Supine to sit: +2 for physical assistance;Mod assist          Transfers Overall transfer level: Needs assistance Equipment used: Rolling walker (2 wheels);Ambulation equipment used   Sit to Stand: Max assist;+2 physical assistance;From elevated surface           General transfer comment: Patient unable to get fully standing despite +2 max assist. Patient is unsafe with transfers, taking hands off walker and reaching out for signifcant other. Patient's legs buckled and she was lowered to the floor. Required Hoyer lift to return to bed.    Ambulation/Gait               General Gait Details: unable   Stairs             Wheelchair Mobility    Modified Rankin (Stroke Patients Only)       Balance Overall balance assessment: Needs assistance Sitting-balance support: Feet supported;Bilateral upper extremity supported Sitting balance-Leahy Scale: Poor Sitting balance - Comments: min - max A for static sitting Postural control: Left lateral lean Standing balance support: Bilateral upper extremity supported;During functional activity Standing balance-Leahy Scale: Zero  Cognition Arousal/Alertness: Awake/alert Behavior During Therapy: Flat affect Overall Cognitive Status: Within Functional Limits for tasks assessed                                          Exercises      General Comments        Pertinent Vitals/Pain Pain Assessment: No/denies pain Faces Pain Scale: No hurt Breathing: normal    Home Living                           Prior Function            PT Goals (current goals can now be found in the care plan section) Acute Rehab PT Goals Patient Stated Goal: regain baseline strength and stop falling PT Goal Formulation: With family Time For Goal Achievement: 11/13/21 Potential to Achieve Goals: Poor Additional Goals Additional Goal #1: Pt will demonstrate self propulsion in WC >538ft. Progress towards PT goals: Not progressing toward goals - comment (continues to require +2 max assist)    Frequency    Min 2X/week      PT Plan Current plan remains appropriate    Co-evaluation              AM-PAC PT "6 Clicks" Mobility   Outcome Measure  Help needed turning from your back to your side while in a flat bed without using bedrails?: A Lot Help needed moving from lying on your back to sitting on the side of a flat bed without using bedrails?: A Lot Help needed moving to and from a bed to a chair (including a wheelchair)?: Total Help needed standing up from a chair using your arms (e.g., wheelchair or bedside chair)?: Total Help needed to walk in hospital room?: Total Help needed climbing 3-5 steps with a railing? : Total 6 Click Score: 8    End of Session Equipment Utilized During Treatment: Gait belt Activity Tolerance: Patient limited by fatigue Patient left: in bed;with call bell/phone within reach;with bed alarm set;with family/visitor present Nurse Communication: Mobility status PT Visit Diagnosis: Unsteadiness on feet (R26.81);Repeated falls (R29.6);Muscle weakness (generalized) (M62.81);History of falling (Z91.81)     Time: 1101-1135 PT Time Calculation (min) (ACUTE ONLY): 34 min  Charges:  $Therapeutic Activity: 8-22 mins                     Slayton Lubitz, PT, GCS 11/08/21,11:50 AM

## 2021-11-08 NOTE — Progress Notes (Signed)
Received call from Case Manager that rehab facility in [place] is going to send patient back to [facility] r/t patient having scabies.  Patient was treated upon arrival to [facility] on 10/31/21 and this information given to [place], nurse at [place] rehab during report.

## 2021-11-08 NOTE — Progress Notes (Signed)
Occupational Therapy Treatment Patient Details Name: Tiffany [name] MRN: [id number] DOB: November 17, 1961 Today's Date: 11/08/2021   History of present illness Tiffany [name] is a 59yoF presenting to hospital 10/30 c progressive weakness, N/V, and multiple falls.  Pt seen in ED at [facility] 10/27 c dizziness, weakness, frequent falls over the preceeding weeks and left AMA. Pt also in ED on 10/21 c bilat hand and foot pain s/p fall at home. Pt admitted with hypokalemia, refractory N/V, frequent falls, and thrombocytopenia.  PMH includes h/o hiatal hernia, Nissen fundoplication, BPD, COPD, cardiomegaly, and abdominal sx. Per fiance, pt has not been able to walk very well for estiamted 6 months to the point where he typically asks her to remain seated on 4WW and mobilize that way. Pt has notes ([facility]) as far back as March 2022 documenting multiple falls 2/2 BLE buckling.   OT comments  Upon entering the room, pt supine in bed with no c/o pain and requesting to transfer to recliner chair. Pt's significant other present in the room during the session. Pt required mod A of 2 to EOB for B LEs and trunk support. Pt seen with PT for skilled co-tx for safety. Pt in partial stand with max A of 2 from elevated bed. Pt returning to EOB. On second stand pt scooting buttocks forward towards floor and therapy lowering pt to the floor for pt and therapist safety. Pt reports her B LEs buckled when attempting the second time. +2 to position hoyer pad and use of lift to return pt to bed. Pt rolling L <> R with Mod A of 2 to remove lift pad. Pt with no c/o pain. RN and MD notified.    Recommendations for follow up therapy are one component of a multi-disciplinary discharge planning process, led by the attending physician.  Recommendations may be updated based on patient status, additional functional criteria and insurance authorization.    Follow Up Recommendations  Skilled nursing-short term rehab (<3 hours/day)     Assistance Recommended at Discharge Frequent or constant Supervision/Assistance  Equipment Recommendations  Other (comment) (defer to next venue of care)       Precautions / Restrictions Precautions Precautions: Fall Restrictions Weight Bearing Restrictions: No       Mobility Bed Mobility Overal bed mobility: Needs Assistance Bed Mobility: Supine to Sit Rolling: +2 for physical assistance;Mod assist   Supine to sit: +2 for physical assistance;Mod assist          Transfers Overall transfer level: Needs assistance Equipment used: Rolling walker (2 wheels);Ambulation equipment used   Sit to Stand: Max assist;+2 physical assistance;From elevated surface           General transfer comment: Patient unable to get fully standing despite +2 max assist. Patient is unsafe with transfers, taking hands off walker and reaching out for signifcant other. Patient's legs buckled and she was lowered to the floor. Required Hoyer lift to return to bed.     Balance Overall balance assessment: Needs assistance Sitting-balance support: Feet supported;Bilateral upper extremity supported Sitting balance-Leahy Scale: Poor Sitting balance - Comments: min - max A for static sitting Postural control: Left lateral lean Standing balance support: Bilateral upper extremity supported;During functional activity Standing balance-Leahy Scale: Zero Standing balance comment: required BUE support                           ADL either performed or assessed with clinical judgement    Extremity/Trunk Assessment  Upper Extremity Assessment Upper Extremity Assessment: Generalized weakness   Lower Extremity Assessment Lower Extremity Assessment: Generalized weakness        Vision Patient Visual Report: No change from baseline            Cognition Arousal/Alertness: Awake/alert Behavior During Therapy: Flat affect Overall Cognitive Status: Within Functional Limits for tasks assessed                                                        Pertinent Vitals/ Pain       Pain Assessment: No/denies pain Faces Pain Scale: No hurt Breathing: normal  Home Living Family/patient expects to be discharged to:: Other (Comment)                                            Frequency  Min 2X/week        Progress Toward Goals  OT Goals(current goals can now be found in the care plan section)  Progress towards OT goals: Progressing toward goals  Acute Rehab OT Goals Patient Stated Goal: none stated Time For Goal Achievement: 11/13/21 Potential to Achieve Goals: Good  Plan Discharge plan remains appropriate;Frequency remains appropriate       AM-PAC OT "6 Clicks" Daily Activity     Outcome Measure   Help from another person eating meals?: None Help from another person taking care of personal grooming?: A Little Help from another person toileting, which includes using toliet, bedpan, or urinal?: Total Help from another person bathing (including washing, rinsing, drying)?: A Lot Help from another person to put on and taking off regular upper body clothing?: A Lot Help from another person to put on and taking off regular lower body clothing?: Total 6 Click Score: 13    End of Session    OT Visit Diagnosis: Unsteadiness on feet (R26.81);Repeated falls (R29.6);History of falling (Z91.81)   Activity Tolerance Patient tolerated treatment well   Patient Left in bed;with bed alarm set;with call bell/phone within reach;with family/visitor present   Nurse Communication Mobility status        Time: 1100-1135 OT Time Calculation (min): 35 min  Charges: OT General Charges $OT Visit: 1 Visit OT Treatments $Therapeutic Activity: 8-22 mins  Jackquline Denmark, MS, OTR/L , CBIS ascom [phone]  11/08/21, 12:48 PM

## 2021-11-08 NOTE — ED Triage Notes (Signed)
Pt brought in by ems from 1st floor of [facility].  Pt can not go back to the [facility].  Pt has scabies pt denies any pain.  No itching.  Pt alert  speech clear.

## 2021-11-08 NOTE — Progress Notes (Signed)
Report called to [facility].  All questions answered.  Patient left the unit with EMS transport.

## 2021-11-08 NOTE — ED Provider Notes (Signed)
[facility]  ____________________________________________   Event Date/Time   First MD Initiated Contact with Patient 11/08/21 1913     (approximate)  I have reviewed the triage vital signs and the nursing notes.   HISTORY  Chief Complaint No chief complaint on file.    HPI Tiffany [name] is a 60 y.o. female morbid obesity with a BMI of 46, history of GERD status post nissen fundoplication, bipolar disorder, COPD, migraines who presents with EMS because she was not accepted by her SNF.  Patient was admitted recently for nausea and vomiting, about 1 week.  Her hospitalization was complicated by intermittent altered mental status which was thought to be primarily driven by her psychiatric condition although she does have obesity hypoventilation and is supposed to be on CPAP at night.  She was ultimately discharged today from the hospital.  There is a note from nursing that says that the facility that she was post to be sent to was not going to accept her because of apparent scabies which patient was treated for on the first day of her admission.  For unclear reasons she was sent there and then returns back to the ED.  Patient's husband is really not aware of why thinks it may be was related to the scabies.  He notes that she is at her baseline in terms of her mental status.  Patient is not talking to me right now, unable to obtain additional history from her         Past Medical History:  Diagnosis Date   Asthma    Bipolar 1 disorder (HCC)    COPD (chronic obstructive pulmonary disease) (HCC)    Enlarged heart    GERD (gastroesophageal reflux disease)    Migraines     Patient Active Problem List   Diagnosis Date Noted   Hypokalemia 10/29/2021   Frequent falls 10/29/2021   Refractory nausea and vomiting 10/29/2021   Thrombocytopenia (HCC) 10/29/2021   GERD (gastroesophageal reflux disease) 10/29/2021    Past Surgical History:  Procedure Laterality  Date   ABDOMINAL HYSTERECTOMY     ABDOMINAL SURGERY     "For acid reflux"   CESAREAN SECTION      Prior to Admission medications   Medication Sig Start Date End Date Taking? Authorizing Provider  atorvastatin (LIPITOR) 80 MG tablet Take 80 mg by mouth daily.   Yes [provider]  DULoxetine (CYMBALTA) 30 MG capsule Take 30 mg by mouth daily. 09/05/21 09/05/22 Yes [provider]  FLOVENT HFA 110 MCG/ACT inhaler Inhale 2 puffs into the lungs 2 (two) times daily. 10/20/21  Yes [provider]  HYDROcodone-acetaminophen (NORCO/VICODIN) 5-325 MG tablet Take 1 tablet by mouth every 6 (six) hours as needed for up to 3 days for severe pain or moderate pain. 11/08/21 11/11/21 Yes Lynn Ito, MD  loratadine (CLARITIN) 10 MG tablet Take 1 tablet (10 mg total) by mouth every evening. 11/08/21  Yes Lynn Ito, MD  metoCLOPramide (REGLAN) 10 MG tablet Take 1 tablet (10 mg total) by mouth 3 (three) times daily before meals. 11/08/21  Yes Lynn Ito, MD  pantoprazole (PROTONIX) 40 MG tablet Take 40 mg by mouth 2 (two) times daily. 08/17/21  Yes [provider]  SPIRIVA HANDIHALER 18 MCG inhalation capsule 1 capsule at bedtime. 08/02/21  Yes [provider]  traZODone (DESYREL) 100 MG tablet Take 1 tablet (100 mg total) by mouth at bedtime as needed for sleep. 11/08/21  Yes Lynn Ito, MD  acetaminophen (TYLENOL) 325 MG tablet Take 2 tablets (650 mg total) by mouth every 6 (six) hours as needed for mild pain (or Fever >/= 101). 11/08/21   Lynn Ito, MD  diclofenac Sodium (VOLTAREN) 1 % GEL Apply 4 g topically 4 (four) times daily. 11/08/21   Lynn Ito, MD  diphenhydrAMINE (BENADRYL) 25 mg capsule Take 1 capsule (25 mg total) by mouth every 4 (four) hours as needed for itching. 11/08/21   Lynn Ito, MD  folic acid (FOLVITE) 1 MG tablet Take 5 tablets (5 mg total) by mouth daily. 11/09/21   Lynn Ito, MD  Multiple Vitamin (MULTIVITAMIN WITH MINERALS) TABS  tablet Take 1 tablet by mouth daily. 11/09/21   Lynn Ito, MD  Nutritional Supplements (FEEDING SUPPLEMENT, KATE FARMS STANDARD 1.4,) LIQD liquid Take 325 mLs by mouth 3 (three) times daily between meals. 11/08/21   Lynn Ito, MD  permethrin (ELIMITE) 5 % cream Thoroughly massage cream (using about half the tube) from head to soles of feet; leave on for 8 to 14 hours before removing (shower or bath); 10/12/21   Sharman Cheek, MD  polyethylene glycol (MIRALAX / GLYCOLAX) 17 g packet Take 17 g by mouth 2 (two) times daily. 11/08/21   Lynn Ito, MD  SUMAtriptan (IMITREX) 100 MG tablet Take 100 mg by mouth daily as needed. 08/02/21   [provider]  Vitamin D, Ergocalciferol, (DRISDOL) 1.25 MG (50000 UNIT) CAPS capsule Take 1 capsule (50,000 Units total) by mouth every 7 (seven) days. 11/13/21   Lynn Ito, MD    Allergies Dairy aid [tilactase], Egg [eggs or egg-derived products], and Milk-related compounds  Family History  Problem Relation Age of Onset   Hypertension Mother     Social History Social History   Tobacco Use   Smoking status: Never   Smokeless tobacco: Never  Substance Use Topics   Alcohol use: Never   Drug use: Never    Review of Systems   Review of Systems  Unable to perform ROS: Psychiatric disorder   Physical Exam Updated Vital Signs BP (!) 140/93 (BP Location: Right Arm)   Pulse 84   Temp 98.6 F (37 C) (Oral)   Resp 11   Ht 5\' 9"  (1.753 m)   Wt 131.1 kg   SpO2 100%   BMI 42.68 kg/m   Physical Exam Vitals and nursing note reviewed.  Constitutional:      General: She is not in acute distress.    Appearance: Normal appearance.  HENT:     Head: Normocephalic and atraumatic.     Nose: Nose normal.  Eyes:     General: No scleral icterus.    Conjunctiva/sclera: Conjunctivae normal.  Cardiovascular:     Rate and Rhythm: Normal rate and regular rhythm.  Pulmonary:     Effort: Pulmonary effort is normal. No respiratory distress.      Breath sounds: No stridor.  Abdominal:     General: Abdomen is flat. There is no distension.     Palpations: Abdomen is soft.     Tenderness: There is no abdominal tenderness. There is no guarding.  Musculoskeletal:        General: No deformity or signs of injury.     Cervical back: Normal range of motion.     Comments: Excoriations on her bilateral upper and lower extremities  Skin:    General: Skin is dry.     Coloration: Skin is not jaundiced or pale.  Neurological:     Mental Status: She is  alert.     Comments: Patient is sleeping, she does open her eyes and arouse to voice, does not answer questions or follow commands  Psychiatric:        Mood and Affect: Mood normal.        Behavior: Behavior normal.     LABS (all labs ordered are listed, but only abnormal results are displayed)  Labs Reviewed  BASIC METABOLIC PANEL - Abnormal; Notable for the following components:      Result Value   Sodium 132 (*)    Potassium 3.2 (*)    Chloride 90 (*)    CO2 33 (*)    Calcium 8.8 (*)    All other components within normal limits  CBC - Abnormal; Notable for the following components:   WBC 3.9 (*)    RBC 3.67 (*)    Hemoglobin 9.9 (*)    HCT 30.8 (*)    Platelets 133 (*)    nRBC 1.0 (*)    All other components within normal limits   ____________________________________________  EKG  N/a ____________________________________________  RADIOLOGY Ky Barban, personally viewed and evaluated these images (plain radiographs) as part of my medical decision making, as well as reviewing the written report by the radiologist.  ED MD interpretation:  n/a    ____________________________________________   PROCEDURES  Procedure(s) performed (including Critical Care):  Procedures   ____________________________________________   INITIAL IMPRESSION / ASSESSMENT AND PLAN / ED COURSE     Patient is a 60 year old female with multiple comorbid conditions who  presents as a bounce back from recent hospital admission just discharged today and was a apparently not accepted by her SNF.  Currently this was due to a prior scabies infection which she was treated for during her hospitalization.  We attempted to call the nursing facility but unfortunately were not able to get through on multiple attempts.  Patient's husband really is unaware of what is going on.  The patient herself also really not able to provide history.  She is somewhat altered although does appear that she was like this during admission as well was thought primarily to be due to underlying psychiatric condition.  Unfortunately social worker not available at this time.  She will stay here in the ED until we can figure out a safe disposition plan for her.  Agree that she is really not suitable for discharge home.  We will attempt to get her on nightly CPAP and order home meds.      ____________________________________________   FINAL CLINICAL IMPRESSION(S) / ED DIAGNOSES  Final diagnoses:  Ambulatory dysfunction     ED Discharge Orders     None        Note:  This document was prepared using Dragon voice recognition software and may include unintentional dictation errors.    Georga Hacking, MD 11/08/21 2251

## 2021-11-08 NOTE — TOC Transition Note (Signed)
Transition of Care [facility]) - CM/SW Discharge Note   Patient Details  Name: Tiffany [name] MRN: [id number] Date of Birth: February 02, 1961  Transition of Care [facility]) CM/SW Contact:  Allayne Butcher, RN Phone Number: 11/08/2021, 12:39 PM   Clinical Narrative:    Patient is medically cleared for discharge to [facility] health and Rehab and insurance authorization has been approved.  Patient will be going to room 202 A- bedside RN will call report.  RNCM has arranged EMS.  Significant other at the bedside and aware of discharge today- address of facility provided to him with their telephone number.    Final next level of care: Skilled Nursing Facility Barriers to Discharge: Barriers Resolved   Patient Goals and CMS Choice Patient states their goals for this hospitalization and ongoing recovery are:: to get better CMS Medicare.gov Compare Post Acute Care list provided to:: Patient Choice offered to / list presented to : Patient  Discharge Placement PASRR number recieved: 10/31/21            Patient chooses bed at: [facility] Patient to be transferred to facility by: [facility] Name of family member notified: Eddie- significant other Patient and family notified of of transfer: 11/08/21  Discharge Plan and Services In-house Referral: NA   Post Acute Care Choice: Skilled Nursing Facility          DME Arranged: N/A DME Agency: NA       HH Arranged: NA HH Agency: NA        Social Determinants of Health (SDOH) Interventions     Readmission Risk Interventions Readmission Risk Prevention Plan 11/08/2021  Transportation Screening Complete  PCP or Specialist Appt within 5-7 Days Complete  Home Care Screening Complete  Medication Review (RN CM) Complete  Some recent data might be hidden

## 2021-11-08 NOTE — ED Notes (Signed)
Attempted to call [facility] center for futher clarification as to why pt was not accepted.  [facility] did not answer any phone calls from this RN.  Will attempt to call again at a later time.

## 2021-11-09 MED ORDER — BUDESONIDE 0.5 MG/2ML IN SUSP
1.0000 mg | Freq: Two times a day (BID) | RESPIRATORY_TRACT | Status: DC
  Administered 2021-11-09 – 2021-11-15 (×11): 1 mg via RESPIRATORY_TRACT
  Filled 2021-11-09 (×11): qty 4

## 2021-11-09 MED ORDER — DICLOFENAC SODIUM 1 % EX GEL
2.0000 g | Freq: Four times a day (QID) | CUTANEOUS | Status: DC
Start: 2021-11-09 — End: 2021-11-15
  Administered 2021-11-09 – 2021-11-15 (×15): 2 g via TOPICAL
  Filled 2021-11-09 (×2): qty 100

## 2021-11-09 NOTE — ED Notes (Signed)
Pt asking to be repositioned, pt placed on L side, additional warm blankets given as well.

## 2021-11-09 NOTE — Progress Notes (Signed)
Patient refuses CPAP/BIPAP, she states she is claustrophobic and can not wear our mask. Patient states she does not have a unit at home to bring in.

## 2021-11-09 NOTE — TOC Initial Note (Signed)
Transition of Care [facility]) - Initial/Assessment Note    Patient Details  Name: Tiffany [name] MRN: [id number] Date of Birth: [birth year]  Transition of Care [facility]) CM/SW Contact:    Marina Goodell Phone Number: [phone] 11/09/2021, 4:17 PM  Clinical Narrative:                  Patient presets to [facility] after d/c from [facility] to [facility].  Pt has dx of scabies and facility wanted patient to return to [facility] for a 5 day quarantine before returning to facility.  CSW spoke with Revonda Standard from [facility] and she stated they patient may be abel to return tomorrow 11/10/2021, but she will have to update this CSW with a confirmation tomorrow.  CSW updated patient and her spouse. CSW stated I would update them tomorrow about returning to the facility tomorrow. Patient and spouse verbalized understanding.  CSW updated EDP.         Patient Goals and CMS Choice        Expected Discharge Plan and Services                                                Prior Living Arrangements/Services                       Activities of Daily Living      Permission Sought/Granted                  Emotional Assessment              Admission diagnosis:  Placement Patient Active Problem List   Diagnosis Date Noted   Hypokalemia 10/29/2021   Frequent falls 10/29/2021   Refractory nausea and vomiting 10/29/2021   Thrombocytopenia (HCC) 10/29/2021   GERD (gastroesophageal reflux disease) 10/29/2021   PCP:  Eddie Dibbles, MD Pharmacy:   [facility] [address], [place] - [address] [place] [address] Phone: [phone] Fax: [phone]  [id number] [address]), [place] - [address]) [place] [address] Phone: [phone] Fax: [phone]     Social Determinants of Health (SDOH) Interventions    Readmission Risk  Interventions Readmission Risk Prevention Plan 11/08/2021  Transportation Screening Complete  PCP or Specialist Appt within 5-7 Days Complete  Home Care Screening Complete  Medication Review (RN CM) Complete  Some recent data might be hidden

## 2021-11-09 NOTE — ED Notes (Signed)
Pt given phone to make phone call at this time.

## 2021-11-09 NOTE — ED Notes (Signed)
Pt moved from ED stretcher to hospital bed at this time for pt comfort.  Purewick placed on pt at this time with suction.

## 2021-11-09 NOTE — ED Provider Notes (Signed)
-----------------------------------------   5:05 AM on 11/09/2021 -----------------------------------------   Blood pressure (!) 150/101, pulse 80, temperature 98.6 F (37 C), temperature source Oral, resp. rate 14, height 5\' 9"  (1.753 m), weight 131.1 kg, SpO2 100 %.  The patient is calm and cooperative at this time.  There have been no acute events since the last update.  Awaiting disposition plan from social work team.   , MD 11/09/21 [phone]

## 2021-11-09 NOTE — ED Notes (Signed)
Pt repositioned to left side for comfort

## 2021-11-09 NOTE — ED Notes (Signed)
Pts blankets adjusted at this time for comfort.  Pt denies any other needs at this time.
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
## 2021-11-10 NOTE — TOC Progression Note (Signed)
Transition of Care [facility]) - Progression Note    Patient Details  Name: Tiffany [name] MRN: [id number] Date of Birth: [birth year]  [facility] CM/SW Contact  Marina Goodell Phone Number: [phone] 11/10/2021, 12:09 PM  Clinical Narrative:     CSW contact Revonda Standard with [facility] (formerly [facility]) requesting update on patient returning to the facility.  [place] stated she contacted management this morning and is waiting for confirmation on whether the patient can return to day.        Expected Discharge Plan and Services                                                 Social Determinants of Health (SDOH) Interventions    Readmission Risk Interventions Readmission Risk Prevention Plan 11/08/2021  Transportation Screening Complete  PCP or Specialist Appt within 5-7 Days Complete  Home Care Screening Complete  Medication Review (RN CM) Complete  Some recent data might be hidden

## 2021-11-10 NOTE — TOC Progression Note (Addendum)
Transition of Care [facility]) - Progression Note    Patient Details  Name: Tiffany [name] MRN: [id number] Date of Birth: [birth year]  Transition of Care [facility]) CM/SW Contact  Marina Goodell Phone Number: [phone] 11/10/2021, 12:57 PM  Clinical Narrative:     CSW spoke with Revonda Standard with [facility] (formerly [facility]) in reference to the patient returning to the facility. [facility] stated because they did not have a private room for the patient the patient would have to wait until 11/14/2021, which is their quarantine protocol for scabies infections.  Patient started treatment on 10/12/2021, and second treatment started on 10/31/2021.  Poe stated the facility is going by the date the patient started the second treatment and she will be allowed to return on 11/14/2021. EDP/ED RN updated.       Expected Discharge Plan and Services                                                 Social Determinants of Health (SDOH) Interventions    Readmission Risk Interventions Readmission Risk Prevention Plan 11/08/2021  Transportation Screening Complete  PCP or Specialist Appt within 5-7 Days Complete  Home Care Screening Complete  Medication Review (RN CM) Complete  Some recent data might be hidden

## 2021-11-10 NOTE — ED Notes (Addendum)
THis RN [facility]. I was told by staff that I needed to call their liason [place] at [phone].

## 2021-11-10 NOTE — ED Notes (Signed)
This Rn called staff named Halle at [phone]. I left a voicemail. Will call back shortly.

## 2021-11-10 NOTE — ED Provider Notes (Signed)
-----------------------------------------   6:08 AM on 11/10/2021 -----------------------------------------   Blood pressure 137/86, pulse 89, temperature 98.1 F (36.7 C), temperature source Oral, resp. rate 18, height 5\' 9"  (1.753 m), weight 131.1 kg, SpO2 95 %.  The patient is calm and cooperative at this time.  There have been no acute events since the last update.  Awaiting disposition plan from social work.  Hopefully patient will be accepted back to facility later today.   , MD 11/10/21 [phone]

## 2021-11-11 MED ORDER — ONDANSETRON 4 MG PO TBDP
4.0000 mg | ORAL_TABLET | Freq: Once | ORAL | Status: AC
  Administered 2021-11-11: 4 mg via ORAL
  Filled 2021-11-11: qty 1

## 2021-11-11 NOTE — ED Notes (Signed)
Pt cleaned, dry linen placed, purwick replaced, suction verified.  Will continue to monitor.

## 2021-11-12 NOTE — ED Notes (Signed)
Pt repositioned in bed. Visitor at bedside

## 2021-11-12 NOTE — ED Notes (Signed)
Pt sleeping at this time. Respirations even and unlabored, NAD noted °

## 2021-11-12 NOTE — ED Notes (Signed)
Complete bed change and new purewick

## 2021-11-12 NOTE — TOC Progression Note (Signed)
Transition of Care [facility]) - Progression Note    Patient Details  Name: Tiffany [name] MRN: [id number] Date of Birth: 1961/05/10  Transition of Care [facility]) CM/SW Contact  Joseph Art, [place] Phone Number: 11/12/2021, 8:24 AM  Clinical Narrative:     Disposition is for patient to discharge to [facility] ([facility]) on 11/14/2021, which meets their scabies quarantine protocol.        Expected Discharge Plan and Services                                                 Social Determinants of Health (SDOH) Interventions    Readmission Risk Interventions Readmission Risk Prevention Plan 11/08/2021  Transportation Screening Complete  PCP or Specialist Appt within 5-7 Days Complete  Home Care Screening Complete  Medication Review (RN CM) Complete  Some recent data might be hidden

## 2021-11-12 NOTE — ED Provider Notes (Signed)
-----------------------------------------   6:21 AM on 11/12/2021 -----------------------------------------   Blood pressure 134/77, pulse 98, temperature 98.1 F (36.7 C), temperature source Oral, resp. rate 17, height 1.753 m (5\' 9" ), weight 131.1 kg, SpO2 93 %.  The patient is calm and cooperative at this time.  There have been no acute events since the last update.  Awaiting disposition plan from [facility] team.   [facility], MD 11/12/21 [phone]

## 2021-11-12 NOTE — ED Notes (Signed)
Pt wet themselves. New Chuck pads placed, new purewick placed. New blankets given.

## 2021-11-13 ENCOUNTER — Emergency Department: Payer: Medicaid Other

## 2021-11-13 ENCOUNTER — Encounter: Payer: Self-pay | Admitting: Radiology

## 2021-11-13 ENCOUNTER — Observation Stay: Payer: Medicaid Other

## 2021-11-13 DIAGNOSIS — R262 Difficulty in walking, not elsewhere classified: Secondary | ICD-10-CM

## 2021-11-13 DIAGNOSIS — R932 Abnormal findings on diagnostic imaging of liver and biliary tract: Secondary | ICD-10-CM

## 2021-11-13 DIAGNOSIS — J9601 Acute respiratory failure with hypoxia: Principal | ICD-10-CM

## 2021-11-13 DIAGNOSIS — G9340 Encephalopathy, unspecified: Secondary | ICD-10-CM

## 2021-11-13 DIAGNOSIS — E871 Hypo-osmolality and hyponatremia: Secondary | ICD-10-CM

## 2021-11-13 DIAGNOSIS — J9602 Acute respiratory failure with hypercapnia: Secondary | ICD-10-CM

## 2021-11-13 DIAGNOSIS — R531 Weakness: Secondary | ICD-10-CM

## 2021-11-13 LAB — CBC WITH DIFFERENTIAL/PLATELET
Abs Immature Granulocytes: 0.03 10*3/uL (ref 0.00–0.07)
Basophils Absolute: 0 10*3/uL (ref 0.0–0.1)
Basophils Relative: 0 %
Eosinophils Absolute: 0 10*3/uL (ref 0.0–0.5)
Eosinophils Relative: 0 %
HCT: 34.7 % — ABNORMAL LOW (ref 36.0–46.0)
Hemoglobin: 11.1 g/dL — ABNORMAL LOW (ref 12.0–15.0)
Immature Granulocytes: 0 %
Lymphocytes Relative: 19 %
Lymphs Abs: 1.3 10*3/uL (ref 0.7–4.0)
MCH: 27.1 pg (ref 26.0–34.0)
MCHC: 32 g/dL (ref 30.0–36.0)
MCV: 84.8 fL (ref 80.0–100.0)
Monocytes Absolute: 1 10*3/uL (ref 0.1–1.0)
Monocytes Relative: 15 %
Neutro Abs: 4.5 10*3/uL (ref 1.7–7.7)
Neutrophils Relative %: 66 %
Platelets: 181 10*3/uL (ref 150–400)
RBC: 4.09 MIL/uL (ref 3.87–5.11)
RDW: 15.6 % — ABNORMAL HIGH (ref 11.5–15.5)
WBC: 6.9 10*3/uL (ref 4.0–10.5)
nRBC: 0 % (ref 0.0–0.2)

## 2021-11-13 LAB — BLOOD GAS, VENOUS
Acid-Base Excess: 12.6 mmol/L — ABNORMAL HIGH (ref 0.0–2.0)
Bicarbonate: 39.6 mmol/L — ABNORMAL HIGH (ref 20.0–28.0)
O2 Saturation: 59.5 %
Patient temperature: 37
pCO2, Ven: 64 mmHg — ABNORMAL HIGH (ref 44.0–60.0)
pH, Ven: 7.4 (ref 7.250–7.430)
pO2, Ven: 31 mmHg — CL (ref 32.0–45.0)

## 2021-11-13 LAB — HEPATIC FUNCTION PANEL
ALT: 27 U/L (ref 0–44)
AST: 35 U/L (ref 15–41)
Albumin: 2.8 g/dL — ABNORMAL LOW (ref 3.5–5.0)
Alkaline Phosphatase: 61 U/L (ref 38–126)
Bilirubin, Direct: 0.3 mg/dL — ABNORMAL HIGH (ref 0.0–0.2)
Indirect Bilirubin: 0.8 mg/dL (ref 0.3–0.9)
Total Bilirubin: 1.1 mg/dL (ref 0.3–1.2)
Total Protein: 6.5 g/dL (ref 6.5–8.1)

## 2021-11-13 LAB — URINALYSIS, COMPLETE (UACMP) WITH MICROSCOPIC
Bilirubin Urine: NEGATIVE
Glucose, UA: NEGATIVE mg/dL
Hgb urine dipstick: NEGATIVE
Ketones, ur: 5 mg/dL — AB
Leukocytes,Ua: NEGATIVE
Nitrite: NEGATIVE
Protein, ur: NEGATIVE mg/dL
Specific Gravity, Urine: 1.008 (ref 1.005–1.030)
pH: 7 (ref 5.0–8.0)

## 2021-11-13 LAB — BASIC METABOLIC PANEL
Anion gap: 9 (ref 5–15)
BUN: 8 mg/dL (ref 6–20)
CO2: 33 mmol/L — ABNORMAL HIGH (ref 22–32)
Calcium: 8.7 mg/dL — ABNORMAL LOW (ref 8.9–10.3)
Chloride: 91 mmol/L — ABNORMAL LOW (ref 98–111)
Creatinine, Ser: 0.66 mg/dL (ref 0.44–1.00)
GFR, Estimated: >60 mL/min (ref >60)
Glucose, Bld: 88 mg/dL (ref 70–99)
Potassium: 3.5 mmol/L (ref 3.5–5.1)
Sodium: 133 mmol/L — ABNORMAL LOW (ref 135–145)

## 2021-11-13 LAB — MAGNESIUM: Magnesium: 2.1 mg/dL (ref 1.7–2.4)

## 2021-11-13 LAB — CBG MONITORING, ED: Glucose-Capillary: 84 mg/dL (ref 70–99)

## 2021-11-13 LAB — D-DIMER, QUANTITATIVE: D-Dimer, Quant: 0.72 ug/mL-FEU — ABNORMAL HIGH (ref 0.00–0.50)

## 2021-11-13 MED ORDER — ONDANSETRON HCL 4 MG/2ML IJ SOLN: 4.0000 mg | Freq: Four times a day (QID) | INTRAMUSCULAR | Status: DC | PRN

## 2021-11-13 MED ORDER — POTASSIUM CHLORIDE 20 MEQ PO PACK
40.0000 meq | PACK | Freq: Once | ORAL | Status: AC
  Administered 2021-11-13: 40 meq via ORAL
  Filled 2021-11-13: qty 2

## 2021-11-13 MED ORDER — ENOXAPARIN SODIUM 80 MG/0.8ML IJ SOSY
0.5000 mg/kg | PREFILLED_SYRINGE | INTRAMUSCULAR | Status: DC
  Administered 2021-11-13 – 2021-11-14 (×2): 65 mg via SUBCUTANEOUS
  Filled 2021-11-13 (×3): qty 0.65

## 2021-11-13 MED ORDER — PERMETHRIN 5 % EX CREA
TOPICAL_CREAM | Freq: Once | CUTANEOUS | Status: DC
  Filled 2021-11-13: qty 60

## 2021-11-13 MED ORDER — ONDANSETRON HCL 4 MG PO TABS: 4.0000 mg | ORAL_TABLET | Freq: Four times a day (QID) | ORAL | Status: DC | PRN

## 2021-11-13 MED ORDER — ACETAMINOPHEN 650 MG RE SUPP: 650.0000 mg | Freq: Four times a day (QID) | RECTAL | Status: DC | PRN

## 2021-11-13 MED ORDER — DICLOFENAC SODIUM 1 % EX GEL: 4.0000 g | Freq: Four times a day (QID) | CUTANEOUS | Status: DC

## 2021-11-13 MED ORDER — SODIUM CHLORIDE 0.9 % IV SOLN: INTRAVENOUS | Status: DC

## 2021-11-13 MED ORDER — POTASSIUM CHLORIDE CRYS ER 20 MEQ PO TBCR: 40.0000 meq | EXTENDED_RELEASE_TABLET | Freq: Once | ORAL | Status: DC

## 2021-11-13 MED ORDER — IOHEXOL 350 MG/ML SOLN
75.0000 mL | Freq: Once | INTRAVENOUS | Status: AC | PRN
  Administered 2021-11-13: 75 mL via INTRAVENOUS

## 2021-11-13 MED ORDER — ACETAMINOPHEN 325 MG PO TABS
650.0000 mg | ORAL_TABLET | Freq: Four times a day (QID) | ORAL | Status: DC | PRN
  Administered 2021-11-13 – 2021-11-14 (×2): 650 mg via ORAL
  Filled 2021-11-13: qty 2

## 2021-11-13 MED ORDER — MAGNESIUM HYDROXIDE 400 MG/5ML PO SUSP: 30.0000 mL | Freq: Every day | ORAL | Status: DC | PRN

## 2021-11-13 NOTE — TOC Progression Note (Signed)
Transition of Care [facility]) - Progression Note    Patient Details  Name: Tiffany [name] MRN: [id number] Date of Birth: 28-Apr-1961  Transition of Care [facility]) CM/SW [place], [place] Phone Number: [phone] 11/13/2021, 3:03 PM  Clinical Narrative:     CSW met with patient and brother and updated them, [place] will admit the patient tomorrow 11/14/2021, but the patient is now admitted which may delay placement until the patient is medically ready for d/c. Patient and brother verbalized understanding.  CSW updated [facility] Admissions Coordinator w/ [facility] on patient admit.       Expected Discharge Plan and Services                                                 Social Determinants of Health (SDOH) Interventions    Readmission Risk Interventions Readmission Risk Prevention Plan 11/08/2021  Transportation Screening Complete  PCP or Specialist Appt within 5-7 Days Complete  Home Care Screening Complete  Medication Review (RN CM) Complete  Some recent data might be hidden

## 2021-11-13 NOTE — ED Notes (Signed)
Bed repositioned

## 2021-11-13 NOTE — ED Notes (Signed)
Pt more alert at this time. Responsive to verbal stimuli. Oriented to person, place.  NADnoted. 97% on RA

## 2021-11-13 NOTE — ED Notes (Signed)
This RN to bedside to assess pt and perform hourly rounding. Pt with mucous on face, RR even and unlabored. Pt face wiped with wet cloth. Pt responsive to painful stimuli, not answering questions. HOB elevated.  VS obtained. Pt with SpO2 70-82% on RA, improvement when aroused. Pt placed on 4L . Dr Katrinka Blazing notified of assessment.  Family at bedside reports pt desats while sleeping.  IV started, no withdrawal from pain when IV started

## 2021-11-13 NOTE — ED Notes (Signed)
Pt titrated to RA after breathing treatment administered

## 2021-11-13 NOTE — Evaluation (Addendum)
Physical Therapy Evaluation Patient Details Name: PAMALEE MARCOE MRN: [id number] DOB: 03/04/1961 Today's Date: 11/13/2021  History of Present Illness  60 y.o. female with medical history significant for  morbid obesity with a BMI of 46, history of GERD status post nissen fundoplication, bipolar disorder, COPD, migraines who presented to the ED by EMS because she was not accepted by her SNF upon previous admission's untimely discharge on 11/9 due to scabies. Pt now OBS in ED awaiting rehab placement.  Clinical Impression  Pt received supine in bed, fiancee at bedside in the hallway of the ED. Pt was recently d/c on 11/9 to a SNF who refused to accept patient once she arrived due to scabies. Pt then returned to the hospital. She has not ambulated since late-October prior to previous admission. She endured 2 falls during last admission. For safety and physical assist, pt requires at least a +2 (possibly 3) for out of bed mobility.   Pt was able to achieve supine<>sit with MAX A of 1 person at eval. She remained sitting for ~5 minutes as PT engaged pt for increased stability sitting EOB as well as for MMT. Pt unexpectedly began to return trunk to the bed as PT guided to avoid hitting bed rail. Pt requires motivation to participate as she frequently reported "I can't." Standing/ambulation was not attempted due to lack of assistance for safety. PT is recommending STR as she is currently not safe to return home and far from her ambulatory baseline. Pt son arrived at end of session and was at bedside upon leaving. Would benefit from skilled PT to address above deficits and promote optimal return to PLOF.       Recommendations for follow up therapy are one component of a multi-disciplinary discharge planning process, led by the attending physician.  Recommendations may be updated based on patient status, additional functional criteria and insurance authorization.  Follow Up Recommendations Skilled  nursing-short term rehab (<3 hours/day)    Assistance Recommended at Discharge Frequent or constant Supervision/Assistance  Functional Status Assessment Patient has had a recent decline in their functional status and demonstrates the ability to make significant improvements in function in a reasonable and predictable amount of time.  Equipment Recommendations  Other (comment) (TBD)    Recommendations for Other Services       Precautions / Restrictions Precautions Precautions: Fall Restrictions Weight Bearing Restrictions: No      Mobility  Bed Mobility Overal bed mobility: Needs Assistance Bed Mobility: Supine to Sit;Sit to Supine     Supine to sit: HOB elevated;Max assist Sit to supine: Max assist   General bed mobility comments: MAX A to manage both trunk and BLE. All bed features used. VC on sequencing for increased pt participation.    Transfers                   General transfer comment: deferred for safety - did not have a +2 to attempt    Ambulation/Gait                  Stairs            Wheelchair Mobility    Modified Rankin (Stroke Patients Only)       Balance Overall balance assessment: Needs assistance Sitting-balance support: Feet unsupported;Bilateral upper extremity supported Sitting balance-Leahy Scale: Poor Sitting balance - Comments: MIN-MAX A for static sitting. Pt refuses to bring RLE off EOB due to fear of falling off the edge of the bed. Postural control:  Left lateral lean     Standing balance comment: deferred for safety                             Pertinent Vitals/Pain Pain Assessment: No/denies pain    Home Living Family/patient expects to be discharged to:: Other (Comment) (motel)                   Additional Comments: Lives with her fiancee Link Snuffer) in a first floor room, states there are no steps to enter. Tub bath with a bench and standard toilet.    Prior Function Prior Level of  Function : Needs assist;History of Falls (last six months)       Physical Assist : ADLs (physical);Mobility (physical) Mobility (physical): Bed mobility;Transfers;Gait ADLs (physical): Grooming;Bathing;Dressing;Toileting;IADLs Mobility Comments: Has not walked since the end of October prior to previous admission on 10/30. She was using a Rollator at the time. She endured 3 falls prior to last admission and 2 falls during last admission. ADLs Comments: Assist with all ADLs.     Hand Dominance        Extremity/Trunk Assessment   Upper Extremity Assessment Upper Extremity Assessment: Defer to OT evaluation    Lower Extremity Assessment Lower Extremity Assessment: Generalized weakness (<3/5 MMT in B hip flexors. 4-/5 B knee ext. 4/5 DF.)       Communication   Communication: No difficulties  Cognition Arousal/Alertness: Awake/alert Behavior During Therapy: Flat affect;Anxious Overall Cognitive Status: Within Functional Limits for tasks assessed                                 General Comments: Pt answers questions appropriately and accurately but with increased processing time. A&Ox4. Lacks safety awareness. Fearful/anxious of falling during mobility.        General Comments      Exercises     Assessment/Plan    PT Assessment Patient needs continued PT services  PT Problem List Decreased strength;Decreased range of motion;Decreased activity tolerance;Decreased safety awareness;Decreased mobility;Obesity;Pain;Decreased balance       PT Treatment Interventions DME instruction;Balance training;Gait training;Neuromuscular re-education;Stair training;Functional mobility training;Patient/family education;Therapeutic activities;Therapeutic exercise    PT Goals (Current goals can be found in the Care Plan section)  Acute Rehab PT Goals Patient Stated Goal: regain baseline strength and stop falling PT Goal Formulation: With patient/family Time For Goal  Achievement: 11/27/21 Potential to Achieve Goals: Poor    Frequency Min 2X/week   Barriers to discharge        Co-evaluation               AM-PAC PT "6 Clicks" Mobility  Outcome Measure Help needed turning from your back to your side while in a flat bed without using bedrails?: A Lot Help needed moving from lying on your back to sitting on the side of a flat bed without using bedrails?: A Lot Help needed moving to and from a bed to a chair (including a wheelchair)?: Total Help needed standing up from a chair using your arms (e.g., wheelchair or bedside chair)?: Total Help needed to walk in hospital room?: Total Help needed climbing 3-5 steps with a railing? : Total 6 Click Score: 8    End of Session   Activity Tolerance: Patient limited by fatigue Patient left: in bed;with call bell/phone within reach;with family/visitor present (in hallway of ED. Son at bedside.) Nurse Communication: Mobility  status PT Visit Diagnosis: Unsteadiness on feet (R26.81);Repeated falls (R29.6);Muscle weakness (generalized) (M62.81);History of falling (Z91.81);Difficulty in walking, not elsewhere classified (R26.2)    Time: 1607-3710 PT Time Calculation (min) (ACUTE ONLY): 23 min   Charges:   PT Evaluation $PT Eval Moderate Complexity: 1 Mod PT Treatments $Therapeutic Activity: 8-22 mins        Basilia Jumbo PT, DPT 11/13/21 4:50 PM [phone]

## 2021-11-13 NOTE — ED Provider Notes (Signed)
I sent care of this patient approximately 0 700.  Please see office inflator's note for full details regarding patient's initial evaluation assessment.  In brief patient is currently boarding awaiting placement in rehab facility.  Patient was recently admitted 10/30 and discharged on 11/9 but facility refused patient due to concerns for bedbugs which apparently already treated.  Patient has been pending placement since then.  While she has a history of COPD I do not see obstructive sleep apnea on her discharge summary.  She had been admitted for generalized weakness recurrent falls somnolence and had received some BiPAP it seems at night.  On my assessment this morning with RN patient is noted to be hypoxic and minimally responsive.  She was placed on 4 L nasal cannula and full work-up obtained including CT head and CTA chest.  CT head and chest are unremarkable for evidence of intracranial abnormality PE or pneumonia.  CTA otherwise shows some gallbladder distention but patient is not to have any significant abdominal pain or tenderness hepatic function panel unremarkable.  Labs otherwise remarkable for a CBC that shows no leukocytosis and stable anemia.  MP shows no significant electrolyte or metabolic derangements aside from some chronic hyponatremia and hypochloremia.  Urine does not appear infected.  VBG without evidence of hypercapnic respiratory failure although this was approximately hour after initial evaluation patient waking up a little bit.  On reassessment she is waking up a little more with the oxygen and able to take a DuoNeb.  On serial reassessments she is improvement in her mental status and able to wean back to room air.  Suspect she became hypercapnic and subsequently hypoxic overnight.  I suspect she may have obstructive sleep apnea and should be on CPAP at night.  Given episode of altered mental status and hypoxia this morning we do not believe it is safe for patient to stay in emergency room  and she will be much more safe once admitted.  I will admit to medicine service.  .Critical Care Performed by: Gilles Chiquito, MD Authorized by: Gilles Chiquito, MD   Critical care provider statement:    Critical care time (minutes):  30   Critical care was necessary to treat or prevent imminent or life-threatening deterioration of the following conditions:  Respiratory failure   Critical care was time spent personally by me on the following activities:  Development of treatment plan with patient or surrogate, discussions with consultants, evaluation of patient's response to treatment, examination of patient, ordering and review of laboratory studies, ordering and review of radiographic studies, ordering and performing treatments and interventions, pulse oximetry, re-evaluation of patient's condition and review of old charts   Care discussed with: admitting provider      Gilles Chiquito, MD 11/13/21 1249

## 2021-11-13 NOTE — ED Notes (Signed)
Pt to CT

## 2021-11-13 NOTE — ED Notes (Signed)
Dr Katrinka Blazing notified of pO2 31

## 2021-11-13 NOTE — H&P (Addendum)
[place]   PATIENT NAME: Tiffany [name]    MR#:  [id number]  DATE OF BIRTH:  12/08/1961  DATE OF ADMISSION:  11/08/2021  PRIMARY CARE PHYSICIAN: Eddie Dibbles, MD   Patient is coming from: The patient was sent to [facility] and refused  REQUESTING/REFERRING PHYSICIAN: Antoine Primas, MD  CHIEF COMPLAINT:  Un unresponsive and hypoxic this morning in the ED  HISTORY OF PRESENT ILLNESS:  Tiffany [name] is a 60 y.o. African-American female with medical history significant for  morbid obesity with a BMI of 46, history of GERD status post nissen fundoplication, bipolar disorder, COPD, migraines who presented to the ED by EMS because she was not accepted by her SNF on 11/9.  The patient was admitted recently for nausea and vomiting and generalized weakness with hypokalemia and recurrent falls on 10/30.  Her hospitalization was complicated by intermittent altered mental status which was thought to be primarily driven by her psychiatric condition although she does have obesity hypoventilation and is supposed to be on CPAP at night.  She was ultimately discharged on 11/9.  There is a note from nursing that says that the facility that she was post to be sent to was not going to accept her because of apparent scabies which patient was treated for on the first day of her admission.  For unclear reasons she was sent there and then returns back to the ED.  Patient's brother and fianc are really not aware of why thinks it may be was related to the scabies.  Her brother and POA prefers to try another SNF.  He notes that she is at her baseline in terms of her mental status.    She has been in the ED apparently for placement attempts.  This morning she was unresponsive and difficult to arouse and was noted to be hypoxic by the ED physician.The patient was alert and oriented x3 to be cooperative.  She denies any dyspnea or cough or wheezing.  No nausea or vomiting or abdominal pain.  No chest  pain or palpitations.  She complains of cold hands and feet without numbness.  No paresthesias otherwise or focal muscle weakness.  No dysuria, oliguria, urinary frequency or urgency or flank pain.   ED Course: Her latest vital signs revealed a blood pressure 139/91 with pulse currently 100% on 4 L of O2 by nasal cannula this AM.  Temperature was 97.6 axillary and heart rate was 72 with respiratory rate of 20.  CBC today showed mild hyponatremia of 133 with hypochloremia of 91 with borderline potassium of 3.5 and her CO2 was 33.  Albumin was 2.8.  Blood glucose was 88.  VBG showed pH 7.4 with HCO3 of 39.6 and PCO2 of 64 with PO2 of 31.  UA was unremarkable.  Quantitative D-dimer was 0.72.  CBC showed hemoglobin 11.1 and hematocrit 34.7 compared to 9.9 and 30.8 on 11/9.   EKG as reviewed by me : EKG showed normal sinus rhythm with a rate of 73 with lateral J-point elevation in early repolarization Imaging: Portable chest ray showed low lung volumes with streaky right infrahilar and left suprahilar opacities, atelectasis versus infiltrate.  Chest CTA revealed the following, 1. No central or larger pulmonary embolus. 2. Hiatus hernia. 3. Mild interval distention of gallbladder, incompletely assessed. If continued clinical concern, consider RIGHT upper quadrant ultrasound for further evaluation.  She will be admitted to an observation medical monitored bed for further evaluation and management. PAST MEDICAL  HISTORY:   Past Medical History:  Diagnosis Date  . Asthma   . Bipolar 1 disorder ([place])   . COPD (chronic obstructive pulmonary disease) ([place])   . Enlarged heart   . GERD (gastroesophageal reflux disease)   . Migraines     PAST SURGICAL HISTORY:   Past Surgical History:  Procedure Laterality Date  . ABDOMINAL HYSTERECTOMY    . ABDOMINAL SURGERY     "For acid reflux"  . CESAREAN SECTION      SOCIAL HISTORY:   Social History   Tobacco Use  . Smoking status: Never  . Smokeless  tobacco: Never  Substance Use Topics  . Alcohol use: Never    FAMILY HISTORY:   Family History  Problem Relation Age of Onset  . Hypertension Mother     DRUG ALLERGIES:   Allergies  Allergen Reactions  . Dairy Aid [Tilactase] Other (See Comments)    Exacerbates asthma  . Egg [Eggs Or Egg-Derived Products] Other (See Comments)    Exacerbated asthma  . Milk-Related Compounds     REVIEW OF SYSTEMS:   ROS As per history of present illness. All pertinent systems were reviewed above. Constitutional, HEENT, cardiovascular, respiratory, GI, GU, musculoskeletal, neuro, psychiatric, endocrine, integumentary and hematologic systems were reviewed and are otherwise negative/unremarkable except for positive findings mentioned above in the HPI.   MEDICATIONS AT HOME:   Prior to Admission medications   Medication Sig Start Date End Date Taking? Authorizing Provider  atorvastatin (LIPITOR) 80 MG tablet Take 80 mg by mouth daily.   Yes [provider]  DULoxetine (CYMBALTA) 30 MG capsule Take 30 mg by mouth daily. 09/05/21 09/05/22 Yes [provider]  FLOVENT HFA 110 MCG/ACT inhaler Inhale 2 puffs into the lungs 2 (two) times daily. 10/20/21  Yes [provider]  loratadine (CLARITIN) 10 MG tablet Take 1 tablet (10 mg total) by mouth every evening. 11/08/21  Yes Nolberto Hanlon, MD  metoCLOPramide (REGLAN) 10 MG tablet Take 1 tablet (10 mg total) by mouth 3 (three) times daily before meals. 11/08/21  Yes Nolberto Hanlon, MD  pantoprazole (PROTONIX) 40 MG tablet Take 40 mg by mouth 2 (two) times daily. 08/17/21  Yes [provider]  SPIRIVA HANDIHALER 18 MCG inhalation capsule 1 capsule at bedtime. 08/02/21  Yes [provider]  traZODone (DESYREL) 100 MG tablet Take 1 tablet (100 mg total) by mouth at bedtime as needed for sleep. 11/08/21  Yes Nolberto Hanlon, MD  acetaminophen (TYLENOL) 325 MG tablet Take 2 tablets (650 mg total) by mouth every 6 (six) hours as  needed for mild pain (or Fever >/= 101). 11/08/21   Nolberto Hanlon, MD  diclofenac Sodium (VOLTAREN) 1 % GEL Apply 4 g topically 4 (four) times daily. 11/08/21   Nolberto Hanlon, MD  diphenhydrAMINE (BENADRYL) 25 mg capsule Take 1 capsule (25 mg total) by mouth every 4 (four) hours as needed for itching. 11/08/21   Nolberto Hanlon, MD  folic acid (FOLVITE) 1 MG tablet Take 5 tablets (5 mg total) by mouth daily. 11/09/21   Nolberto Hanlon, MD  Multiple Vitamin (MULTIVITAMIN WITH MINERALS) TABS tablet Take 1 tablet by mouth daily. 11/09/21   Nolberto Hanlon, MD  Nutritional Supplements (FEEDING SUPPLEMENT, KATE FARMS STANDARD 1.4,) LIQD liquid Take 325 mLs by mouth 3 (three) times daily between meals. 11/08/21   Nolberto Hanlon, MD  permethrin (ELIMITE) 5 % cream Thoroughly massage cream (using about half the tube) from head to soles of feet; leave on for 8 to  14 hours before removing (shower or bath); 10/12/21   Carrie Mew, MD  polyethylene glycol (MIRALAX / GLYCOLAX) 17 g packet Take 17 g by mouth 2 (two) times daily. 11/08/21   Nolberto Hanlon, MD  SUMAtriptan (IMITREX) 100 MG tablet Take 100 mg by mouth daily as needed. 08/02/21   [provider]  Vitamin D, Ergocalciferol, (DRISDOL) 1.25 MG (50000 UNIT) CAPS capsule Take 1 capsule (50,000 Units total) by mouth every 7 (seven) days. 11/13/21   Nolberto Hanlon, MD      VITAL SIGNS:  Blood pressure (!) 139/91, pulse 72, temperature 97.6 F (36.4 C), temperature source Axillary, resp. rate 20, height 5\' 9"  (1.753 m), weight 131.1 kg, SpO2 100 %.  PHYSICAL EXAMINATION:  Physical Exam  GENERAL:  60 y.o.-year-old African-American female patient lying in the bed with no acute distress.  EYES: Pupils equal, round, reactive to light and accommodation. No scleral icterus. Extraocular muscles intact.  HEENT: Head atraumatic, normocephalic. Oropharynx and nasopharynx clear.  NECK:  Supple, no jugular venous distention. No thyroid enlargement, no tenderness.  LUNGS:  Normal breath sounds bilaterally, no wheezing, rales,rhonchi or crepitation. No use of accessory muscles of respiration.  CARDIOVASCULAR: Regular rate and rhythm, S1, S2 normal. No murmurs, rubs, or gallops.  ABDOMEN: Soft, nondistended, nontender. Bowel sounds present. No organomegaly or mass.  EXTREMITIES: No pedal edema, cyanosis, or clubbing.  NEUROLOGIC: Cranial nerves II through XII are intact. Muscle strength 5/5 in all extremities. Sensation intact. Gait not checked.  PSYCHIATRIC: The patient is alert and oriented x 3.  Normal affect and good eye contact. SKIN: No obvious rash, lesion, or ulcer.   LABORATORY PANEL:   CBC Recent Labs  Lab 11/13/21 0739  WBC 6.9  HGB 11.1*  HCT 34.7*  PLT 181   ------------------------------------------------------------------------------------------------------------------  Chemistries  Recent Labs  Lab 11/13/21 0931 11/13/21 0940  NA 133*  --   K 3.5  --   CL 91*  --   CO2 33*  --   GLUCOSE 88  --   BUN 8  --   CREATININE 0.66  --   CALCIUM 8.7*  --   MG 2.1  --   AST  --  35  ALT  --  27  ALKPHOS  --  61  BILITOT  --  1.1   ------------------------------------------------------------------------------------------------------------------  Cardiac Enzymes No results for input(s): TROPONINI in the last 168 hours. ------------------------------------------------------------------------------------------------------------------  RADIOLOGY:  CT HEAD WO CONTRAST (5MM)  Result Date: 11/13/2021 CLINICAL DATA:  Mental status change.  Unknown cause. EXAM: CT HEAD WITHOUT CONTRAST TECHNIQUE: Contiguous axial images were obtained from the base of the skull through the vertex without intravenous contrast. COMPARISON:  CT head 10/29/2021 FINDINGS: Brain: There is no evidence of acute intracranial hemorrhage, mass lesion, brain edema or extra-axial fluid collection. The ventricles and subarachnoid spaces are appropriately sized for age.  There is no CT evidence of acute cortical infarction. Partially empty sella turcica again noted. Vascular: Intracranial vascular calcifications. No hyperdense vessel identified. Skull: Negative for fracture or focal lesion. Sinuses/Orbits: The visualized paranasal sinuses and mastoid air cells are clear. No orbital abnormalities are seen. Other: None. IMPRESSION: 1. Stable head CT without acute intracranial findings. 2. Stable appearance of the partially empty sella turcica with osseous expansion. Electronically Signed   By: Richardean Sale M.D.   On: 11/13/2021 09:12   CT Angio Chest PE W and/or Wo Contrast  Result Date: 11/13/2021 CLINICAL DATA:  PE suspected.  High probability. EXAM: CT ANGIOGRAPHY CHEST  WITH CONTRAST TECHNIQUE: Multidetector CT imaging of the chest was performed using the standard protocol during bolus administration of intravenous contrast. Multiplanar CT image reconstructions and MIPs were obtained to evaluate the vascular anatomy. CONTRAST:  12mL OMNIPAQUE IOHEXOL 350 MG/ML SOLN COMPARISON:  Chest radiograph, 11/13/2021.  CT AP, 08/12/21 FINDINGS: Cardiovascular: Satisfactory opacification of the pulmonary arteries to the central pulmonary arterial level. No evidence of central or larger pulmonary embolus. Normal heart size. No pericardial effusion. Mediastinum/Nodes: No enlarged mediastinal, hilar, or axillary lymph nodes. Thyroid gland, trachea, and esophagus demonstrate no significant findings. Lungs/Pleura: Hypoinflation. Trace basilar atelectasis. Lungs are clear. No pleural effusion or pneumothorax. Upper Abdomen: Small hiatus hernia. Mild interval distention of gallbladder, incompletely assessed. Musculoskeletal: No chest wall abnormality. No acute or significant osseous findings. Review of the MIP images confirms the above findings. IMPRESSION: 1. No central or larger pulmonary embolus. 2. Hiatus hernia. 3. Mild interval distention of gallbladder, incompletely assessed. If  continued clinical concern, consider RIGHT upper quadrant ultrasound further evaluation. Electronically Signed   By: Michaelle Birks M.D.   On: 11/13/2021 10:49   DG Chest Portable 1 View  Result Date: 11/13/2021 CLINICAL DATA:  Hypoxia EXAM: PORTABLE CHEST 1 VIEW COMPARISON:  08/12/2021 FINDINGS: Stable cardiomediastinal contours. Slightly low lung volumes. Streaky right infrahilar and left suprahilar opacities. No pleural effusion or pneumothorax. IMPRESSION: Low lung volumes with streaky right infrahilar and left suprahilar opacities, atelectasis versus infiltrate. Consider repeat PA and lateral radiographs of the chest to see if these findings persist. Electronically Signed   By: Davina Poke D.O.   On: 11/13/2021 09:07      IMPRESSION AND PLAN:  Active Problems:   Encephalopathy  1.  Acute encephalopathy like secondary to hypoxemia and hypercarbia could be related to obstructive sleep apnea/OHS with subsequent acute hypoxic and hypercarbic respiratory failure. - Patient will be admitted to an observation medical monitored bed. - We will place on CPAP nightly. - O2 protocol will be followed. - Incentive spirometry will be given. - We will follow neurochecks every 4 hours for 24 hours and monitor mental status.  2.  Mild hyponatremia. - The patient will be hydrated with IV normal saline and will follow BMP.  3.  Generalized weakness and history of recent recurrent falls. - We will obtain PT evaluation to assess ambulation while she is here. - Case management consult to be obtained for placement.  4.  Gallbladder distention. - We will obtain right upper quadrant ultrasound for further assessment  5.  COPD with exacerbation. - We will continue her Spiriva, Flovent and place her on as needed DuoNebs.  6.  History of recent falls. - PT will be reassessing her.  7.  GERD. - We will continue PPI therapy.  8.  History of migraines. - We will continue as needed Imitrex.  9.   History of recent scabies. - We will utilize Elimite cream as needed.  10. Dyslipidemia. - We will continue statin therapy.   DVT prophylaxis: Lovenox. Code Status: full code. Family Communication:  The plan of care was discussed in details with the patient (and family). I answered all questions. The patient agreed to proceed with the above mentioned plan. Further management will depend upon hospital course. Disposition Plan: Back to previous home environment Consults called: none. All the records are reviewed and case discussed with ED provider.  Status is: Observation  Remains inpatient appropriate because:Ongoing diagnostic testing needed not appropriate for outpatient work up, Unsafe d/c plan, IV treatments appropriate due  to intensity of illness or inability to take PO, and Inpatient level of care appropriate due to severity of illness   Dispo: The patient is from: Home              Anticipated d/c is to: Home              Patient currently is not medically stable to d/c.              Difficult to place patient: No  TOTAL TIME TAKING CARE OF THIS PATIENT: 55 minutes.     Christel Mormon M.D on 11/13/2021 at 12:53 PM  [facility]   From 7 PM-7 AM, contact night-coverage [email or web address]  CC: Primary care physician; Nelle Don, MD

## 2021-11-13 NOTE — ED Provider Notes (Signed)
Patient had no events overnight vitals are stable.  Awaiting placement   Concha Se, MD 11/13/21 [phone]

## 2021-11-14 ENCOUNTER — Observation Stay: Payer: Medicaid Other

## 2021-11-14 DIAGNOSIS — E878 Other disorders of electrolyte and fluid balance, not elsewhere classified: Secondary | ICD-10-CM | POA: Diagnosis present

## 2021-11-14 DIAGNOSIS — Z7951 Long term (current) use of inhaled steroids: Secondary | ICD-10-CM | POA: Diagnosis not present

## 2021-11-14 DIAGNOSIS — J9602 Acute respiratory failure with hypercapnia: Secondary | ICD-10-CM | POA: Diagnosis present

## 2021-11-14 DIAGNOSIS — E8729 Other acidosis: Secondary | ICD-10-CM | POA: Diagnosis present

## 2021-11-14 DIAGNOSIS — Z20822 Contact with and (suspected) exposure to covid-19: Secondary | ICD-10-CM | POA: Diagnosis present

## 2021-11-14 DIAGNOSIS — Z751 Person awaiting admission to adequate facility elsewhere: Secondary | ICD-10-CM | POA: Diagnosis not present

## 2021-11-14 DIAGNOSIS — Z79899 Other long term (current) drug therapy: Secondary | ICD-10-CM | POA: Diagnosis not present

## 2021-11-14 DIAGNOSIS — R296 Repeated falls: Secondary | ICD-10-CM | POA: Diagnosis present

## 2021-11-14 DIAGNOSIS — I10 Essential (primary) hypertension: Secondary | ICD-10-CM | POA: Diagnosis not present

## 2021-11-14 DIAGNOSIS — R262 Difficulty in walking, not elsewhere classified: Secondary | ICD-10-CM

## 2021-11-14 DIAGNOSIS — Z8249 Family history of ischemic heart disease and other diseases of the circulatory system: Secondary | ICD-10-CM | POA: Diagnosis not present

## 2021-11-14 DIAGNOSIS — J9601 Acute respiratory failure with hypoxia: Secondary | ICD-10-CM | POA: Diagnosis present

## 2021-11-14 DIAGNOSIS — G43909 Migraine, unspecified, not intractable, without status migrainosus: Secondary | ICD-10-CM | POA: Diagnosis present

## 2021-11-14 DIAGNOSIS — E876 Hypokalemia: Secondary | ICD-10-CM | POA: Diagnosis not present

## 2021-11-14 DIAGNOSIS — Z91011 Allergy to milk products: Secondary | ICD-10-CM | POA: Diagnosis not present

## 2021-11-14 DIAGNOSIS — K219 Gastro-esophageal reflux disease without esophagitis: Secondary | ICD-10-CM | POA: Diagnosis present

## 2021-11-14 DIAGNOSIS — R0902 Hypoxemia: Secondary | ICD-10-CM | POA: Diagnosis present

## 2021-11-14 DIAGNOSIS — F319 Bipolar disorder, unspecified: Secondary | ICD-10-CM | POA: Diagnosis present

## 2021-11-14 DIAGNOSIS — E785 Hyperlipidemia, unspecified: Secondary | ICD-10-CM | POA: Diagnosis present

## 2021-11-14 DIAGNOSIS — Z91199 Patient's noncompliance with other medical treatment and regimen due to unspecified reason: Secondary | ICD-10-CM | POA: Diagnosis not present

## 2021-11-14 DIAGNOSIS — Z6841 Body Mass Index (BMI) 40.0 and over, adult: Secondary | ICD-10-CM | POA: Diagnosis not present

## 2021-11-14 DIAGNOSIS — J441 Chronic obstructive pulmonary disease with (acute) exacerbation: Secondary | ICD-10-CM | POA: Diagnosis present

## 2021-11-14 DIAGNOSIS — Z91012 Allergy to eggs: Secondary | ICD-10-CM | POA: Diagnosis not present

## 2021-11-14 DIAGNOSIS — E662 Morbid (severe) obesity with alveolar hypoventilation: Secondary | ICD-10-CM | POA: Diagnosis present

## 2021-11-14 DIAGNOSIS — E871 Hypo-osmolality and hyponatremia: Secondary | ICD-10-CM | POA: Diagnosis present

## 2021-11-14 DIAGNOSIS — G9340 Encephalopathy, unspecified: Secondary | ICD-10-CM | POA: Diagnosis not present

## 2021-11-14 DIAGNOSIS — G9341 Metabolic encephalopathy: Secondary | ICD-10-CM | POA: Diagnosis present

## 2021-11-14 LAB — CBC
HCT: 31.2 % — ABNORMAL LOW (ref 36.0–46.0)
Hemoglobin: 10.2 g/dL — ABNORMAL LOW (ref 12.0–15.0)
MCH: 27.1 pg (ref 26.0–34.0)
MCHC: 32.7 g/dL (ref 30.0–36.0)
MCV: 82.8 fL (ref 80.0–100.0)
Platelets: 213 10*3/uL (ref 150–400)
RBC: 3.77 MIL/uL — ABNORMAL LOW (ref 3.87–5.11)
RDW: 15.3 % (ref 11.5–15.5)
WBC: 5.8 10*3/uL (ref 4.0–10.5)
nRBC: 0 % (ref 0.0–0.2)

## 2021-11-14 LAB — BASIC METABOLIC PANEL
Anion gap: 10 (ref 5–15)
BUN: 9 mg/dL (ref 6–20)
CO2: 29 mmol/L (ref 22–32)
Calcium: 8.5 mg/dL — ABNORMAL LOW (ref 8.9–10.3)
Chloride: 96 mmol/L — ABNORMAL LOW (ref 98–111)
Creatinine, Ser: 0.57 mg/dL (ref 0.44–1.00)
GFR, Estimated: >60 mL/min (ref >60)
Glucose, Bld: 72 mg/dL (ref 70–99)
Potassium: 3.7 mmol/L (ref 3.5–5.1)
Sodium: 135 mmol/L (ref 135–145)

## 2021-11-14 NOTE — TOC Progression Note (Signed)
Transition of Care [facility]) - Progression Note    Patient Details  Name: Tiffany [name] MRN: [id number] Date of Birth: [birth year]  [facility] CM/SW Contact  Marlowe Sax, RN Phone Number: 11/14/2021, 11:26 AM  Clinical Narrative:    Verified with Revonda Standard at [facility] that they will be able to accept the patient once medically stable they are getting updated insurance approval and anticipate having that today        Expected Discharge Plan and Services                                                 Social Determinants of Health (SDOH) Interventions    Readmission Risk Interventions Readmission Risk Prevention Plan 11/08/2021  Transportation Screening Complete  PCP or Specialist Appt within 5-7 Days Complete  Home Care Screening Complete  Medication Review (RN CM) Complete  Some recent data might be hidden

## 2021-11-14 NOTE — Progress Notes (Signed)
[facility]  - [place] [facility] [employer]   PATIENT NAME: Tiffany [name]    MR#:  [id number]  DATE OF BIRTH:  1961/04/09  SUBJECTIVE:  family at bedside wants butter for her grades. Cannot tolerate diary. Denies any complaints asking me if her lips are swollen  REVIEW OF SYSTEMS:   Review of Systems  Constitutional:  Negative for chills, fever and weight loss.  HENT:  Negative for ear discharge, ear pain and nosebleeds.   Eyes:  Negative for blurred vision, pain and discharge.  Respiratory:  Negative for sputum production, shortness of breath, wheezing and stridor.   Cardiovascular:  Negative for chest pain, palpitations, orthopnea and PND.  Gastrointestinal:  Negative for abdominal pain, diarrhea, nausea and vomiting.  Genitourinary:  Negative for frequency and urgency.  Musculoskeletal:  Negative for back pain and joint pain.  Neurological:  Positive for weakness. Negative for sensory change, speech change and focal weakness.  Psychiatric/Behavioral:  Negative for depression and hallucinations. The patient is not nervous/anxious.   Tolerating Diet:yes Tolerating PT: rehab  DRUG ALLERGIES:   Allergies  Allergen Reactions  . Dairy Aid [Tilactase] Other (See Comments)    Exacerbates asthma  . Egg [Eggs Or Egg-Derived Products] Other (See Comments)    Exacerbated asthma  . Milk-Related Compounds     VITALS:  Blood pressure (!) 147/97, pulse 97, temperature 98.3 F (36.8 C), resp. rate 18, height 5\' 9"  (1.753 m), weight 131.1 kg, SpO2 99 %.  PHYSICAL EXAMINATION:   Physical Exam  GENERAL:  60 y.o.-year-old patient lying in the bed with no acute distress. Morbid obesity HEENT: Head atraumatic, normocephalic. Oropharynx and nasopharynx clear. Lips do not appeat to be swollen LUNGS: Normal breath sounds bilaterally, no wheezing, rales, rhonchi. No use of accessory muscles of respiration.  CARDIOVASCULAR: S1, S2 normal. No murmurs, rubs, or gallops.  ABDOMEN:  Soft, nontender, nondistended. Bowel sounds present. No organomegaly or mass.  EXTREMITIES: macular old scars+ UE NEUROLOGIC: nonfocal PSYCHIATRIC:  patient is alert and oriented x 3.  SKIN: No obvious rash, lesion, or ulcer.   LABORATORY PANEL:  CBC Recent Labs  Lab 11/14/21 0555  WBC 5.8  HGB 10.2*  HCT 31.2*  PLT 213    Chemistries  Recent Labs  Lab 11/13/21 0931 11/13/21 0940 11/14/21 0555  NA 133*  --  135  K 3.5  --  3.7  CL 91*  --  96*  CO2 33*  --  29  GLUCOSE 88  --  72  BUN 8  --  9  CREATININE 0.66  --  0.57  CALCIUM 8.7*  --  8.5*  MG 2.1  --   --   AST  --  35  --   ALT  --  27  --   ALKPHOS  --  61  --   BILITOT  --  1.1  --    Cardiac Enzymes No results for input(s): TROPONINI in the last 168 hours. RADIOLOGY:  CT HEAD WO CONTRAST (11/16/21)  Result Date: 11/13/2021 CLINICAL DATA:  Mental status change.  Unknown cause. EXAM: CT HEAD WITHOUT CONTRAST TECHNIQUE: Contiguous axial images were obtained from the base of the skull through the vertex without intravenous contrast. COMPARISON:  CT head 10/29/2021 FINDINGS: Brain: There is no evidence of acute intracranial hemorrhage, mass lesion, brain edema or extra-axial fluid collection. The ventricles and subarachnoid spaces are appropriately sized for age. There is no CT evidence of acute cortical infarction. Partially empty sella turcica again noted.  Vascular: Intracranial vascular calcifications. No hyperdense vessel identified. Skull: Negative for fracture or focal lesion. Sinuses/Orbits: The visualized paranasal sinuses and mastoid air cells are clear. No orbital abnormalities are seen. Other: None. IMPRESSION: 1. Stable head CT without acute intracranial findings. 2. Stable appearance of the partially empty sella turcica with osseous expansion. Electronically Signed   By: Carey Bullocks M.D.   On: 11/13/2021 09:12   CT Angio Chest PE W and/or Wo Contrast  Result Date: 11/13/2021 CLINICAL DATA:  PE  suspected.  High probability. EXAM: CT ANGIOGRAPHY CHEST WITH CONTRAST TECHNIQUE: Multidetector CT imaging of the chest was performed using the standard protocol during bolus administration of intravenous contrast. Multiplanar CT image reconstructions and MIPs were obtained to evaluate the vascular anatomy. CONTRAST:  32mL OMNIPAQUE IOHEXOL 350 MG/ML SOLN COMPARISON:  Chest radiograph, 11/13/2021.  CT AP, 08/12/21 FINDINGS: Cardiovascular: Satisfactory opacification of the pulmonary arteries to the central pulmonary arterial level. No evidence of central or larger pulmonary embolus. Normal heart size. No pericardial effusion. Mediastinum/Nodes: No enlarged mediastinal, hilar, or axillary lymph nodes. Thyroid gland, trachea, and esophagus demonstrate no significant findings. Lungs/Pleura: Hypoinflation. Trace basilar atelectasis. Lungs are clear. No pleural effusion or pneumothorax. Upper Abdomen: Small hiatus hernia. Mild interval distention of gallbladder, incompletely assessed. Musculoskeletal: No chest wall abnormality. No acute or significant osseous findings. Review of the MIP images confirms the above findings. IMPRESSION: 1. No central or larger pulmonary embolus. 2. Hiatus hernia. 3. Mild interval distention of gallbladder, incompletely assessed. If continued clinical concern, consider RIGHT upper quadrant ultrasound further evaluation. Electronically Signed   By: Roanna Banning M.D.   On: 11/13/2021 10:49   DG Chest Portable 1 View  Result Date: 11/13/2021 CLINICAL DATA:  Hypoxia EXAM: PORTABLE CHEST 1 VIEW COMPARISON:  08/12/2021 FINDINGS: Stable cardiomediastinal contours. Slightly low lung volumes. Streaky right infrahilar and left suprahilar opacities. No pleural effusion or pneumothorax. IMPRESSION: Low lung volumes with streaky right infrahilar and left suprahilar opacities, atelectasis versus infiltrate. Consider repeat PA and lateral radiographs of the chest to see if these findings persist.  Electronically Signed   By: Duanne Guess D.O.   On: 11/13/2021 09:07   US Abdomen Limited RUQ (LIVER/GB)  Result Date: 11/14/2021 CLINICAL DATA:  Gallbladder disease. EXAM: ULTRASOUND ABDOMEN LIMITED RIGHT UPPER QUADRANT COMPARISON:  CT 09/12/2021 FINDINGS: Gallbladder: Single gallstone visible within the gallbladder measuring 1.7 cm in diameter. No gallbladder wall thickening or Murphy sign. No surrounding fluid. Common bile duct: Diameter: 6.4 mm, normal Liver: No focal lesion identified. Within normal limits in parenchymal echogenicity. Portal vein is patent on color Doppler imaging with normal direction of blood flow towards the liver. Other: None. IMPRESSION: 1.7 cm gallstone within the gallbladder. No sonographic evidence of cholecystitis or obstruction. Electronically Signed   By: Paulina Fusi M.D.   On: 11/14/2021 09:01   ASSESSMENT AND PLAN:  DEVAN BABINO is a 60 y.o. African-American female with medical history significant for  morbid obesity with a BMI of 46, history of GERD status post nissen fundoplication, bipolar disorder, COPD, migraines who presented to the ED by EMS because she was not accepted by her SNF on 11/9.  The patient was admitted recently for nausea and vomiting and generalized weakness with hypokalemia and recurrent falls on 10/30.   Patient was found unresponsive will and difficult to arouse yesterday and the ED. She was noted to be hypoxic with sats 72-82% on room air with improvement after being placed on 4 L nasal cannula. Patient apparently desaturated's  during nighttime.   Acute encephalopathy metabolic likely secondary to hypoxemia and hypercapnia likely due to undiagnosed sleep apnea/OHS -- patient desaturated to 70-82% in the ER and improved to room air after being on oxygen for couple hours. -- Currently patient is on room air sats hundred percent -- her ABG and VBG shows CO2 retention -- patient has been noncompliant to BiPAP during nighttime during last  admission per MD notes -- will do overnight pulse oximetry. If she qualifies will arrange for home oxygen during nighttime to avoid hypoxia --CT chest neg PE  mild hyponatremia received IV fluids  morbid obesity with generalized weakness in history of recent falls -- seen by physical therapy recommends rehab -- per ER TOC patient does have a place at [facility] will. Will have TOC follow-up  incidental finding of gallstone on ultrasound abdomen. -- Results were notified to patient and fianc -- patient tolerating PO diet. No abdominal pain. LFTs within normal limits  COPD -- stable continue her inhalers and as needed nebulizer  Genella Rife continue PPI  history of ? Scabies -- patient already received treatment  dyslipidemia continue statin   Procedures: Family communication : fianc at bedside Consults : none CODE STATUS: full DVT Prophylaxis : enoxaparin Level of care: Med-Surg Status is: Observation  The patient remains OBS appropriate and will d/c before 2 midnights. Awaiting overnight pulse oximetry. Patient qualifies will set her up with oxygen and then discharged to her facility      TOTAL TIME TAKING CARE OF THIS PATIENT: 25 minutes.  >50% time spent on counselling and coordination of care  Note: This dictation was prepared with Dragon dictation along with smaller phrase technology. Any transcriptional errors that result from this process are unintentional.  Enedina Finner M.D    [facility]   CC: Primary care physician; Eddie Dibbles, MD Patient ID: Tiffany [name], female   DOB: May 31, 1961, 60 y.o.   MRN: [id number]

## 2021-11-14 NOTE — Evaluation (Signed)
Occupational Therapy Evaluation Patient Details Name: Tiffany [name] MRN: [id number] DOB: 1961/09/26 Today's Date: 11/14/2021   History of Present Illness 60 y.o. female with medical history significant for  morbid obesity with a BMI of 46, history of GERD status post nissen fundoplication, bipolar disorder, COPD, migraines who presented to the ED by EMS because she was not accepted by her SNF upon previous admission's untimely discharge on 11/9 due to scabies. Pt now OBS in ED awaiting rehab placement.   Clinical Impression   Ms Rozeboom was seen for OT evaluation this date. Prior to hospital admission, pt was recently admitted, prior to recent admission pt was ambulatory with 6 falls/week per fiancee report. Pt lives with fiancee in [employer] [place]. Pt presents to acute OT demonstrating impaired ADL performance and functional mobility 2/2 decreased activity tolerance, functional strength/ROM/balance deficits, and poor insight into strengths. Pt currently requires MAX A don B socks seated EOB - requires close presence of fiancee as pt reports increased anxiety with dynamic EOB tasks. Pt tolerated ~15 min sitting EOB. Seated therex: BLE long arc quad 1 set x 10 reps each. BUE theraband yellow exercises and green theraputty (overhead press, chest pull apart, horizontal shoulder abduction). Written HEP and yellow theraband and green theraputty provided.   MIN to MOD A static sitting balance - pt places L toes on floor but unwilling to blace BLE on floor citing anxiety. Suspect L lateral lean r/t pt repeatedly attempting to self-initiate return to bed. Pt would benefit from skilled OT to address noted impairments and functional limitations (see below for any additional details) in order to maximize safety and independence while minimizing falls risk and caregiver burden. Upon hospital discharge, recommend STR to maximize pt safety and return to PLOF.       Recommendations for follow up therapy are one component  of a multi-disciplinary discharge planning process, led by the attending physician.  Recommendations may be updated based on patient status, additional functional criteria and insurance authorization.   Follow Up Recommendations  Skilled nursing-short term rehab (<3 hours/day)    Assistance Recommended at Discharge Frequent or constant Supervision/Assistance  Functional Status Assessment  Patient has had a recent decline in their functional status and demonstrates the ability to make significant improvements in function in a reasonable and predictable amount of time.  Equipment Recommendations  Other (comment) (defer to next venue of care)    Recommendations for Other Services       Precautions / Restrictions Precautions Precautions: Fall Restrictions Weight Bearing Restrictions: No      Mobility Bed Mobility Overal bed mobility: Needs Assistance Bed Mobility: Supine to Sit;Sit to Supine     Supine to sit: Mod assist;+2 for physical assistance;HOB elevated Sit to supine: Mod assist;+2 for physical assistance        Transfers Overall transfer level: Needs assistance                Lateral/Scoot Transfers: Total assist General transfer comment: lateral scoot alone EOB - pt assists with anterior weight shift but refuses to place BLE on floor to offload weight from bed citing anxiety.      Balance Overall balance assessment: Needs assistance Sitting-balance support: Feet unsupported;Bilateral upper extremity supported Sitting balance-Leahy Scale: Poor Sitting balance - Comments: MIN to MOD A static sitting balance - pt places L toes on floor but unwilling to blace BLE on floor citing anxiety. Suspect L lateral lean r/t pt repeatedly attempting to self-initiate return to bed. Postural control: Left lateral lean  ADL either performed or assessed with clinical judgement   ADL Overall ADL's : Needs assistance/impaired                                        General ADL Comments: MAX A don B socks seated EOB - requires close presence of fiancee as pt reports increased anxiety with dynamic EOB tasks. Anticipate MAX A +2 for toileting at bed level.      Pertinent Vitals/Pain Pain Assessment: No/denies pain     Hand Dominance Right   Extremity/Trunk Assessment Upper Extremity Assessment Upper Extremity Assessment: Generalized weakness   Lower Extremity Assessment Lower Extremity Assessment: Generalized weakness       Communication Communication Communication: No difficulties   Cognition Arousal/Alertness: Awake/alert Behavior During Therapy: Anxious Overall Cognitive Status: Within Functional Limits for tasks assessed                                 General Comments: Pt answers questions appropriately and accurately but with increased processing time. A&Ox4. Lacks safety awareness. Fearful/anxious of falling during mobility.     General Comments       Exercises Exercises: Other exercises Other Exercises Other Exercises: Pt and family educated re: OT role, DME recs, d/c recs, falls prevention, HEP Other Exercises: LBD, sup<>sit, sitting balance/tolerance, lateral scoot. Other Exercises: Seated therex : BLE long arc quad 1 set x 10 reps each. BUE theraband yellow exercises and green theraputty (overhead press, chest pull apart, horizontal shoulder abduction).   Shoulder Instructions      Home Living Family/patient expects to be discharged to:: Skilled nursing facility                                 Additional Comments: Lives with her fiancee [employer]) in a first floor motel room, states there are no steps to enter. Tub bath with a bench and standard toilet.      Prior Functioning/Environment Prior Level of Function : Needs assist;History of Falls (last six months)       Physical Assist : ADLs (physical);Mobility (physical) Mobility (physical):  Bed mobility;Transfers;Gait ADLs (physical): Grooming;Bathing;Dressing;Toileting;IADLs Mobility Comments: Multiple falls prior to 10/30 admission walking in motel room (fiancee reports 6 falls/week). 2 falls during last admission ADLs Comments: Assist with all ADLs.        OT Problem List: Decreased activity tolerance;Decreased strength;Impaired balance (sitting and/or standing);Decreased safety awareness;Decreased knowledge of precautions;Decreased knowledge of use of DME or AE      OT Treatment/Interventions: Self-care/ADL training;Therapeutic exercise;Energy conservation;DME and/or AE instruction;Therapeutic activities    OT Goals(Current goals can be found in the care plan section) Acute Rehab OT Goals Patient Stated Goal: to get stronger OT Goal Formulation: With patient Time For Goal Achievement: 11/28/21 Potential to Achieve Goals: Good ADL Goals Pt Will Perform Grooming: with set-up;with supervision;sitting Pt Will Perform Lower Body Dressing: with min assist;sitting/lateral leans Pt Will Transfer to Toilet: stand pivot transfer;bedside commode;with mod assist (c LRAD PRN)  OT Frequency: Min 2X/week   Barriers to D/C:            Co-evaluation              AM-PAC OT "6 Clicks" Daily Activity     Outcome Measure Help from another person eating meals?:  None Help from another person taking care of personal grooming?: A Little Help from another person toileting, which includes using toliet, bedpan, or urinal?: A Lot Help from another person bathing (including washing, rinsing, drying)?: A Lot Help from another person to put on and taking off regular upper body clothing?: A Lot Help from another person to put on and taking off regular lower body clothing?: A Lot 6 Click Score: 15   End of Session Nurse Communication: Mobility status  Activity Tolerance: Patient tolerated treatment well Patient left: in bed;with call bell/phone within reach;with bed alarm set;with  family/visitor present  OT Visit Diagnosis: Unsteadiness on feet (R26.81);Repeated falls (R29.6);History of falling (Z91.81)                Time: 4854-6270 OT Time Calculation (min): 36 min Charges:  OT General Charges $OT Visit: 1 Visit OT Evaluation $OT Eval Low Complexity: 1 Low OT Treatments $Self Care/Home Management : 8-22 mins $Therapeutic Exercise: 8-22 mins  Kathie Dike, M.S. OTR/L  11/14/21, 1:03 PM  ascom [phone]

## 2021-11-15 DIAGNOSIS — R0902 Hypoxemia: Secondary | ICD-10-CM

## 2021-11-15 LAB — RESP PANEL BY RT-PCR (FLU A&B, COVID) ARPGX2
Influenza A by PCR: NEGATIVE
Influenza B by PCR: NEGATIVE
SARS Coronavirus 2 by RT PCR: NEGATIVE

## 2021-11-15 NOTE — Discharge Summary (Signed)
[facility] [place] [facility]   PATIENT NAME: Tiffany [name]    MR#:  [id number]  DATE OF BIRTH:  07-03-1961  DATE OF ADMISSION:  11/08/2021 ADMITTING PHYSICIAN: Hannah Beat, MD  DATE OF DISCHARGE: 11/15/2021  PRIMARY CARE PHYSICIAN: Eddie Dibbles, MD    ADMISSION DIAGNOSIS:  Gallbladder disease [K82.9] Encephalopathy [G93.40] Hypoxia [R09.02] Ambulatory dysfunction [R26.2] Altered mental status, unspecified altered mental status type [R41.82]  DISCHARGE DIAGNOSIS:  Acute metabolic encephalopathy suspected due to hypoxia/hypercarbia likley due to OSA/OHS--improved  SECONDARY DIAGNOSIS:   Past Medical History:  Diagnosis Date  . Asthma   . Bipolar 1 disorder (HCC)   . COPD (chronic obstructive pulmonary disease) (HCC)   . Enlarged heart   . GERD (gastroesophageal reflux disease)   . Migraines     HOSPITAL COURSE:  [name] is a 60 y.o. African-American female with medical history significant for  morbid obesity with a BMI of 46, history of GERD status post nissen fundoplication, bipolar disorder, COPD, migraines who presented to the ED by EMS because she was not accepted by her SNF on 11/9.  The patient was admitted recently for nausea and vomiting and generalized weakness with hypokalemia and recurrent falls on 10/30.   Patient was found unresponsive will and difficult to arouse yesterday and the ED. She was noted to be hypoxic with sats 72-82% on room air with improvement after being placed on 4 L nasal cannula. Patient apparently desaturated's during nighttime.    Acute encephalopathy metabolic likely secondary to hypoxemia and hypercapnia likely due to undiagnosed sleep apnea/OHS -- patient desaturated to 70-82% in the ER and improved to room air after being on oxygen for couple hours. -- Currently patient is on room air sats hundred percent -- her ABG and VBG shows CO2 retention -- patient has been noncompliant to BiPAP during  nighttime during last admission per MD notes -- will do overnight pulse oximetry. If she qualifies will arrange for home oxygen during nighttime to avoid hypoxia --CT chest neg PE --Overnite pulse oximetry shows desaturations >55 mins--pt will be using oxygen 2L/min at nitghttime--pt and family aware   mild hyponatremia received IV fluids   morbid obesity with generalized weakness in history of recent falls -- seen by physical therapy recommends rehab -- per ER TOC patient does have a place at [facility] will. Will have TOC follow-up   Incidental finding of gallstone on ultrasound abdomen. -- Results were notified to patient and fianc -- patient tolerating PO diet. No abdominal pain. LFTs within normal limits   COPD -- stable continue her inhalers and as needed nebulizer   Gerd continue PPI   dyslipidemia continue statin     Procedures:overnite pulse oximetry Family communication : fianc at bedside Consults : none CODE STATUS: full DVT Prophylaxis : enoxaparin Level of care: Med-Surg Pt will d/c to rehab today      CONSULTS OBTAINED:    DRUG ALLERGIES:   Allergies  Allergen Reactions  . Dairy Aid [Tilactase] Other (See Comments)    Exacerbates asthma  . Egg [Eggs Or Egg-Derived Products] Other (See Comments)    Exacerbated asthma  . Milk-Related Compounds     DISCHARGE MEDICATIONS:   Allergies as of 11/15/2021       Reactions   Dairy Aid [tilactase] Other (See Comments)   Exacerbates asthma   Egg [eggs Or Egg-derived Products] Other (See Comments)   Exacerbated asthma   Milk-related Compounds  Medication List     STOP taking these medications    diphenhydrAMINE 25 mg capsule Commonly known as: BENADRYL   HYDROcodone-acetaminophen 5-325 MG tablet Commonly known as: NORCO/VICODIN   permethrin 5 % cream Commonly known as: ELIMITE       TAKE these medications    acetaminophen 325 MG tablet Commonly known as: TYLENOL Take 2 tablets (650  mg total) by mouth every 6 (six) hours as needed for mild pain (or Fever >/= 101).   atorvastatin 80 MG tablet Commonly known as: LIPITOR Take 80 mg by mouth daily.   diclofenac Sodium 1 % Gel Commonly known as: VOLTAREN Apply 4 g topically 4 (four) times daily.   DULoxetine 30 MG capsule Commonly known as: CYMBALTA Take 30 mg by mouth daily.   feeding supplement (KATE FARMS STANDARD 1.4) Liqd liquid Take 325 mLs by mouth 3 (three) times daily between meals.   Flovent HFA 110 MCG/ACT inhaler Generic drug: fluticasone Inhale 2 puffs into the lungs 2 (two) times daily.   folic acid 1 MG tablet Commonly known as: FOLVITE Take 5 tablets (5 mg total) by mouth daily.   loratadine 10 MG tablet Commonly known as: CLARITIN Take 1 tablet (10 mg total) by mouth every evening.   metoCLOPramide 10 MG tablet Commonly known as: REGLAN Take 1 tablet (10 mg total) by mouth 3 (three) times daily before meals.   multivitamin with minerals Tabs tablet Take 1 tablet by mouth daily.   pantoprazole 40 MG tablet Commonly known as: PROTONIX Take 40 mg by mouth 2 (two) times daily.   polyethylene glycol 17 g packet Commonly known as: MIRALAX / GLYCOLAX Take 17 g by mouth 2 (two) times daily.   Spiriva HandiHaler 18 MCG inhalation capsule Generic drug: tiotropium 1 capsule at bedtime.   SUMAtriptan 100 MG tablet Commonly known as: IMITREX Take 100 mg by mouth daily as needed.   traZODone 100 MG tablet Commonly known as: DESYREL Take 1 tablet (100 mg total) by mouth at bedtime as needed for sleep.   Vitamin D (Ergocalciferol) 1.25 MG (50000 UNIT) Caps capsule Commonly known as: DRISDOL Take 1 capsule (50,000 Units total) by mouth every 7 (seven) days.               Durable Medical Equipment  (From admission, onward)           Start     Ordered   11/15/21 0747  DME Oxygen  Once       Question Answer Comment  Length of Need Lifetime   Mode or (Route) Nasal cannula    Liters per Minute 2   Frequency Only at night (stationary unit needed)   Oxygen conserving device Yes   Oxygen delivery system Gas      11/15/21 0746            If you experience worsening of your admission symptoms, develop shortness of breath, life threatening emergency, suicidal or homicidal thoughts you must seek medical attention immediately by calling 911 or calling your MD immediately  if symptoms less severe.  You Must read complete instructions/literature along with all the possible adverse reactions/side effects for all the Medicines you take and that have been prescribed to you. Take any new Medicines after you have completely understood and accept all the possible adverse reactions/side effects.   Please note  You were cared for by a hospitalist during your hospital stay. If you have any questions about your discharge medications or the care  you received while you were in the hospital after you are discharged, you can call the unit and asked to speak with the hospitalist on call if the hospitalist that took care of you is not available. Once you are discharged, your primary care physician will handle any further medical issues. Please note that NO REFILLS for any discharge medications will be authorized once you are discharged, as it is imperative that you return to your primary care physician (or establish a relationship with a primary care physician if you do not have one) for your aftercare needs so that they can reassess your need for medications and monitor your lab values. Today   SUBJECTIVE    No new complaints VITAL SIGNS:  Blood pressure (!) 123/59, pulse (!) 101, temperature 98.5 F (36.9 C), resp. rate 17, height 5\' 9"  (1.753 m), weight 131.1 kg, SpO2 (!) 87 %.  I/O:   Intake/Output Summary (Last 24 hours) at 11/15/2021 0747 Last data filed at 11/14/2021 2303 Gross per 24 hour  Intake 421.14 ml  Output 200 ml  Net 221.14 ml    PHYSICAL EXAMINATION:   GENERAL:  60 y.o.-year-old patient lying in the bed with no acute distress. Morbid obesity LUNGS: Normal breath sounds bilaterally, no wheezing, rales,rhonchi or crepitation. No use of accessory muscles of respiration.  CARDIOVASCULAR: S1, S2 normal. No murmurs, rubs, or gallops.  ABDOMEN: Soft, non-tender, non-distended. Bowel sounds present. No organomegaly or mass.  EXTREMITIES: No pedal edema, cyanosis, or clubbing.  NEUROLOGIC: non-focal PSYCHIATRIC:  patient is alert and oriented x 3.  SKIN:old scabs over the UE  DATA REVIEW:   CBC  Recent Labs  Lab 11/14/21 0555  WBC 5.8  HGB 10.2*  HCT 31.2*  PLT 213    Chemistries  Recent Labs  Lab 11/13/21 0931 11/13/21 0940 11/14/21 0555  NA 133*  --  135  K 3.5  --  3.7  CL 91*  --  96*  CO2 33*  --  29  GLUCOSE 88  --  72  BUN 8  --  9  CREATININE 0.66  --  0.57  CALCIUM 8.7*  --  8.5*  MG 2.1  --   --   AST  --  35  --   ALT  --  27  --   ALKPHOS  --  61  --   BILITOT  --  1.1  --     Microbiology Results   No results found for this or any previous visit (from the past 240 hour(s)).  RADIOLOGY:  CT HEAD WO CONTRAST (11/16/21)  Result Date: 11/13/2021 CLINICAL DATA:  Mental status change.  Unknown cause. EXAM: CT HEAD WITHOUT CONTRAST TECHNIQUE: Contiguous axial images were obtained from the base of the skull through the vertex without intravenous contrast. COMPARISON:  CT head 10/29/2021 FINDINGS: Brain: There is no evidence of acute intracranial hemorrhage, mass lesion, brain edema or extra-axial fluid collection. The ventricles and subarachnoid spaces are appropriately sized for age. There is no CT evidence of acute cortical infarction. Partially empty sella turcica again noted. Vascular: Intracranial vascular calcifications. No hyperdense vessel identified. Skull: Negative for fracture or focal lesion. Sinuses/Orbits: The visualized paranasal sinuses and mastoid air cells are clear. No orbital abnormalities are seen.  Other: None. IMPRESSION: 1. Stable head CT without acute intracranial findings. 2. Stable appearance of the partially empty sella turcica with osseous expansion. Electronically Signed   By: 10/31/2021 M.D.   On: 11/13/2021 09:12   CT Angio  Chest PE W and/or Wo Contrast  Result Date: 11/13/2021 CLINICAL DATA:  PE suspected.  High probability. EXAM: CT ANGIOGRAPHY CHEST WITH CONTRAST TECHNIQUE: Multidetector CT imaging of the chest was performed using the standard protocol during bolus administration of intravenous contrast. Multiplanar CT image reconstructions and MIPs were obtained to evaluate the vascular anatomy. CONTRAST:  21mL OMNIPAQUE IOHEXOL 350 MG/ML SOLN COMPARISON:  Chest radiograph, 11/13/2021.  CT AP, 08/12/21 FINDINGS: Cardiovascular: Satisfactory opacification of the pulmonary arteries to the central pulmonary arterial level. No evidence of central or larger pulmonary embolus. Normal heart size. No pericardial effusion. Mediastinum/Nodes: No enlarged mediastinal, hilar, or axillary lymph nodes. Thyroid gland, trachea, and esophagus demonstrate no significant findings. Lungs/Pleura: Hypoinflation. Trace basilar atelectasis. Lungs are clear. No pleural effusion or pneumothorax. Upper Abdomen: Small hiatus hernia. Mild interval distention of gallbladder, incompletely assessed. Musculoskeletal: No chest wall abnormality. No acute or significant osseous findings. Review of the MIP images confirms the above findings. IMPRESSION: 1. No central or larger pulmonary embolus. 2. Hiatus hernia. 3. Mild interval distention of gallbladder, incompletely assessed. If continued clinical concern, consider RIGHT upper quadrant ultrasound further evaluation. Electronically Signed   By: Roanna Banning M.D.   On: 11/13/2021 10:49   DG Chest Portable 1 View  Result Date: 11/13/2021 CLINICAL DATA:  Hypoxia EXAM: PORTABLE CHEST 1 VIEW COMPARISON:  08/12/2021 FINDINGS: Stable cardiomediastinal contours. Slightly  low lung volumes. Streaky right infrahilar and left suprahilar opacities. No pleural effusion or pneumothorax. IMPRESSION: Low lung volumes with streaky right infrahilar and left suprahilar opacities, atelectasis versus infiltrate. Consider repeat PA and lateral radiographs of the chest to see if these findings persist. Electronically Signed   By: Duanne Guess D.O.   On: 11/13/2021 09:07   US Abdomen Limited RUQ (LIVER/GB)  Result Date: 11/14/2021 CLINICAL DATA:  Gallbladder disease. EXAM: ULTRASOUND ABDOMEN LIMITED RIGHT UPPER QUADRANT COMPARISON:  CT 09/12/2021 FINDINGS: Gallbladder: Single gallstone visible within the gallbladder measuring 1.7 cm in diameter. No gallbladder wall thickening or Murphy sign. No surrounding fluid. Common bile duct: Diameter: 6.4 mm, normal Liver: No focal lesion identified. Within normal limits in parenchymal echogenicity. Portal vein is patent on color Doppler imaging with normal direction of blood flow towards the liver. Other: None. IMPRESSION: 1.7 cm gallstone within the gallbladder. No sonographic evidence of cholecystitis or obstruction. Electronically Signed   By: Paulina Fusi M.D.   On: 11/14/2021 09:01     CODE STATUS:     Code Status Orders  (From admission, onward)           Start     Ordered   11/13/21 1253  Full code  Continuous        11/13/21 1253           Code Status History     Date Active Date Inactive Code Status Order ID Comments User Context   10/29/2021 1426 11/08/2021 1514 Full Code [id number]  Lucile Shutters, MD ED        TOTAL TIME TAKING CARE OF THIS PATIENT: 40 minutes.    Enedina Finner M.D  [facility]    CC: Primary care physician; Eddie Dibbles, MD

## 2021-11-15 NOTE — Discharge Instructions (Signed)
Use your inhalers as before Use your oxygen at nighttime

## 2021-11-15 NOTE — Progress Notes (Signed)
Occupational Therapy Treatment Patient Details Name: Tiffany [name] MRN: [id number] DOB: [birth year] Today's Date: 11/15/2021   History of present illness 60 y.o. female with medical history significant for  morbid obesity with a BMI of 46, history of GERD status post nissen fundoplication, bipolar disorder, COPD, migraines who presented to the ED by EMS because she was not accepted by her SNF upon previous admission's untimely discharge on 11/9 due to scabies. Pt now OBS in ED awaiting rehab placement.   OT comments  Pt seen for OT Tx this date to f/u re: safety with ADLs/ADL mobility. Pt requires MAX A to come to EOB sitting with HOB elevated. Pt demos P sitting balance, requiring MIN A intermittently to sustain static sit. OT engages pt in MOD A UB bathing and the below-listed seated therex. Pt requires MOD A +2 with spouse assisting and OT guiding re: safety with assisting with bed mobility to prevent caregiver injury. Pt back to bed with all needs met and in reach at end of session. Note: one stand was attempted from elevated, surface, but unsuccessful clearing pt's bottom from bed with MAX A +2 arm in arm with OT guiding spouse for mobilization including hand positioning and knee blokcing to prevent buckling. Continue to recommend STR f/u therapy services.    Recommendations for follow up therapy are one component of a multi-disciplinary discharge planning process, led by the attending physician.  Recommendations may be updated based on patient status, additional functional criteria and insurance authorization.    Follow Up Recommendations  Skilled nursing-short term rehab (<3 hours/day)    Assistance Recommended at Discharge Frequent or constant Supervision/Assistance  Equipment Recommendations  Other (comment)    Recommendations for Other Services      Precautions / Restrictions Precautions Precautions: Fall Restrictions Weight Bearing Restrictions: No       Mobility Bed  Mobility Overal bed mobility: Needs Assistance Bed Mobility: Supine to Sit;Sit to Supine Rolling: +2 for physical assistance;Mod assist   Supine to sit: Max assist;HOB elevated Sit to supine: Mod assist;+2 for safety/equipment   General bed mobility comments: increased time, +2 assist for back to bed to manage LEs/trunk    Transfers                   General transfer comment: unsuccessful in clearing bottom with MAX A +2 from elevated bed height, but pt does make good effort.     Balance Overall balance assessment: Needs assistance Sitting-balance support: Feet supported;Bilateral upper extremity supported Sitting balance-Leahy Scale: Poor Sitting balance - Comments: MIN A intermittently to sustain static sitting as pt with low tolerance. Postural control: Left lateral lean     Standing balance comment: unsuccessful in coming to full stand, and after the one attempt, pt unable to contribute to scooting hips back and is too close to EOB, returned to supine for safety                           ADL either performed or assessed with clinical judgement   ADL Overall ADL's : Needs assistance/impaired     Grooming: Wash/dry face;Set up;Sitting Grooming Details (indicate cue type and reason): MIN A for sitting support EOB intermittently, can mostly maintain with UE support. SETUP for actual task, increased time. Upper Body Bathing: Moderate assistance;Sitting Upper Body Bathing Details (indicate cue type and reason): CGA/MIN A for static sitting balance EOB, limited tolerance for unsupported sitting (pt leans far forward to rest,  but is relatively unsafe in doing so d/t poor core strength)                                Extremity/Trunk Assessment Upper Extremity Assessment Upper Extremity Assessment: Generalized weakness   Lower Extremity Assessment Lower Extremity Assessment: Generalized weakness        Vision Baseline Vision/History: 1 Wears  glasses Patient Visual Report: No change from baseline     Perception     Praxis      Cognition Arousal/Alertness: Awake/alert Behavior During Therapy: WFL for tasks assessed/performed Overall Cognitive Status: Within Functional Limits for tasks assessed                                 General Comments: somewhat anxious/fearful of falling. She is sort of intermittently drowsy requiring re-stimulation to improve attn. oriented. Able to follow basic commnads. Decreased insight into deificits. States he goal is to "get up and run"          Exercises Other Exercises Other Exercises: OT ed with pt and family re: bed level and seated exercise, engaged pt in seated postural extension/scap retraction x10, glute squeeze x10, tricep pushes x10. Pt tolerates well.   Shoulder Instructions       General Comments      Pertinent Vitals/ Pain       Pain Assessment: No/denies pain Faces Pain Scale: No hurt  Home Living                                          Prior Functioning/Environment              Frequency  Min 2X/week        Progress Toward Goals  OT Goals(current goals can now be found in the care plan section)     Acute Rehab OT Goals Patient Stated Goal: "to get up and run" initially, more realistic goal with encouragement/education: "to get stronger" OT Goal Formulation: With patient Time For Goal Achievement: 11/28/21 Potential to Achieve Goals: Fair  Plan      Co-evaluation                 AM-PAC OT "6 Clicks" Daily Activity     Outcome Measure   Help from another person eating meals?: None Help from another person taking care of personal grooming?: A Little Help from another person toileting, which includes using toliet, bedpan, or urinal?: A Lot Help from another person bathing (including washing, rinsing, drying)?: A Lot Help from another person to put on and taking off regular upper body clothing?: A Lot Help  from another person to put on and taking off regular lower body clothing?: A Lot 6 Click Score: 15    End of Session Equipment Utilized During Treatment: Gait belt  OT Visit Diagnosis: Unsteadiness on feet (R26.81);Repeated falls (R29.6);History of falling (Z91.81)   Activity Tolerance Patient tolerated treatment well   Patient Left in bed;with call bell/phone within reach;with bed alarm set;with family/visitor present   Nurse Communication Mobility status        Time: 4196-2229 OT Time Calculation (min): 32 min  Charges: OT General Charges $OT Visit: 1 Visit OT Treatments $Self Care/Home Management : 8-22 mins $Therapeutic Exercise: 8-22 mins  Gerrianne Scale,  MS, OTR/L ascom [phone] 11/15/21, 3:36 PM

## 2021-11-15 NOTE — TOC Progression Note (Signed)
Transition of Care [facility]) - Progression Note    Patient Details  Name: Tiffany [name] MRN: [id number] Date of Birth: 1961/05/26  Transition of [facility]) CM/SW Contact  Marlowe Sax, RN Phone Number: 11/15/2021, 3:12 PM  Clinical Narrative:      Spoke with Eddie the patients friend, he is in the room with her at this time and will follow her there, Ems has been called and there are 3 ahead of her at this time.      Expected Discharge Plan and Services           Expected Discharge Date: 11/15/21                                     Social Determinants of Health (SDOH) Interventions    Readmission Risk Interventions Readmission Risk Prevention Plan 11/08/2021  Transportation Screening Complete  PCP or Specialist Appt within 5-7 Days Complete  Home Care Screening Complete  Medication Review (RN CM) Complete  Some recent data might be hidden

## 2021-12-04 LAB — BLOOD GAS, ARTERIAL
Acid-Base Excess: 8.2 mmol/L — ABNORMAL HIGH (ref 0.0–2.0)
Bicarbonate: 34.2 mmol/L — ABNORMAL HIGH (ref 20.0–28.0)
O2 Saturation: 92.3 %
Patient temperature: 37
pCO2 arterial: 54 mmHg — ABNORMAL HIGH (ref 32.0–48.0)
pH, Arterial: 7.41 (ref 7.350–7.450)
pO2, Arterial: 64 mmHg — ABNORMAL LOW (ref 83.0–108.0)

## 2022-02-05 ENCOUNTER — Other Ambulatory Visit: Payer: Self-pay

## 2022-02-05 ENCOUNTER — Emergency Department
Admission: EM | Admit: 2022-02-05 | Discharge: 2022-02-05 | Disposition: A | Discharge status: Home / Self Care | Payer: Medicaid Other | Attending: Emergency Medicine | Admitting: Emergency Medicine

## 2022-02-05 DIAGNOSIS — J45909 Unspecified asthma, uncomplicated: Secondary | ICD-10-CM | POA: Diagnosis not present

## 2022-02-05 DIAGNOSIS — I10 Essential (primary) hypertension: Secondary | ICD-10-CM | POA: Diagnosis not present

## 2022-02-05 DIAGNOSIS — R112 Nausea with vomiting, unspecified: Secondary | ICD-10-CM | POA: Insufficient documentation

## 2022-02-05 DIAGNOSIS — E876 Hypokalemia: Secondary | ICD-10-CM | POA: Insufficient documentation

## 2022-02-05 LAB — MAGNESIUM: Magnesium: 1.4 mg/dL — ABNORMAL LOW (ref 1.7–2.4)

## 2022-02-05 LAB — CBC
HCT: 35 % — ABNORMAL LOW (ref 36.0–46.0)
Hemoglobin: 11 g/dL — ABNORMAL LOW (ref 12.0–15.0)
MCH: 28 pg (ref 26.0–34.0)
MCHC: 31.4 g/dL (ref 30.0–36.0)
MCV: 89.1 fL (ref 80.0–100.0)
Platelets: 248 10*3/uL (ref 150–400)
RBC: 3.93 MIL/uL (ref 3.87–5.11)
RDW: 15.2 % (ref 11.5–15.5)
WBC: 7.1 10*3/uL (ref 4.0–10.5)
nRBC: 0 % (ref 0.0–0.2)

## 2022-02-05 LAB — URINALYSIS, ROUTINE W REFLEX MICROSCOPIC
Bacteria, UA: NONE SEEN
Glucose, UA: NEGATIVE mg/dL
Hgb urine dipstick: NEGATIVE
Ketones, ur: 40 mg/dL — AB
Leukocytes,Ua: NEGATIVE
Nitrite: NEGATIVE
Protein, ur: 100 mg/dL — AB
Specific Gravity, Urine: 1.025 (ref 1.005–1.030)
Squamous Epithelial / HPF: NONE SEEN (ref 0–5)
pH: 6 (ref 5.0–8.0)

## 2022-02-05 LAB — COMPREHENSIVE METABOLIC PANEL
ALT: 12 U/L (ref 0–44)
AST: 16 U/L (ref 15–41)
Albumin: 2.5 g/dL — ABNORMAL LOW (ref 3.5–5.0)
Alkaline Phosphatase: 52 U/L (ref 38–126)
Anion gap: 12 (ref 5–15)
BUN: 9 mg/dL (ref 6–20)
CO2: 31 mmol/L (ref 22–32)
Calcium: 8.3 mg/dL — ABNORMAL LOW (ref 8.9–10.3)
Chloride: 93 mmol/L — ABNORMAL LOW (ref 98–111)
Creatinine, Ser: 0.54 mg/dL (ref 0.44–1.00)
GFR, Estimated: >60 mL/min (ref >60)
Glucose, Bld: 85 mg/dL (ref 70–99)
Potassium: 2.8 mmol/L — ABNORMAL LOW (ref 3.5–5.1)
Sodium: 136 mmol/L (ref 135–145)
Total Bilirubin: 1.1 mg/dL (ref 0.3–1.2)
Total Protein: 6.4 g/dL — ABNORMAL LOW (ref 6.5–8.1)

## 2022-02-05 LAB — PREGNANCY, URINE: Preg Test, Ur: NEGATIVE

## 2022-02-05 LAB — LIPASE, BLOOD: Lipase: 23 U/L (ref 11–51)

## 2022-02-05 MED ORDER — POTASSIUM CHLORIDE 20 MEQ PO PACK
60.0000 meq | PACK | ORAL | Status: AC
  Administered 2022-02-05: 60 meq via ORAL
  Filled 2022-02-05: qty 3

## 2022-02-05 MED ORDER — PANTOPRAZOLE SODIUM 40 MG IV SOLR
40.0000 mg | Freq: Once | INTRAVENOUS | Status: AC
Start: 2022-02-05 — End: 2022-02-05
  Administered 2022-02-05: 40 mg via INTRAVENOUS
  Filled 2022-02-05: qty 40

## 2022-02-05 MED ORDER — FAMOTIDINE 20 MG PO TABS: 20.0000 mg | ORAL_TABLET | Freq: Two times a day (BID) | ORAL | 0 refills | Status: DC

## 2022-02-05 MED ORDER — MAGNESIUM SULFATE 2 GM/50ML IV SOLN
2.0000 g | Freq: Once | INTRAVENOUS | Status: AC
  Administered 2022-02-05: 2 g via INTRAVENOUS
  Filled 2022-02-05: qty 50

## 2022-02-05 MED ORDER — POTASSIUM CHLORIDE 10 MEQ/100ML IV SOLN
10.0000 meq | INTRAVENOUS | Status: AC
  Administered 2022-02-05 (×2): 10 meq via INTRAVENOUS
  Filled 2022-02-05 (×2): qty 100

## 2022-02-05 MED ORDER — ONDANSETRON HCL 4 MG/2ML IJ SOLN
4.0000 mg | Freq: Once | INTRAMUSCULAR | Status: AC
  Administered 2022-02-05: 4 mg via INTRAVENOUS
  Filled 2022-02-05: qty 2

## 2022-02-05 MED ORDER — SODIUM CHLORIDE 0.9 % IV BOLUS
1000.0000 mL | Freq: Once | INTRAVENOUS | Status: AC
Start: 2022-02-05 — End: 2022-02-05
  Administered 2022-02-05: 1000 mL via INTRAVENOUS

## 2022-02-05 MED ORDER — SUCRALFATE 1 G PO TABS
1.0000 g | ORAL_TABLET | Freq: Once | ORAL | Status: AC
  Administered 2022-02-05: 1 g via ORAL
  Filled 2022-02-05: qty 1

## 2022-02-05 MED ORDER — ONDANSETRON 4 MG PO TBDP: 4.0000 mg | ORAL_TABLET | Freq: Three times a day (TID) | ORAL | 0 refills | Status: DC | PRN

## 2022-02-05 MED ORDER — FAMOTIDINE IN NACL 20-0.9 MG/50ML-% IV SOLN
20.0000 mg | Freq: Once | INTRAVENOUS | Status: AC
  Administered 2022-02-05: 20 mg via INTRAVENOUS
  Filled 2022-02-05: qty 50

## 2022-02-05 NOTE — ED Notes (Signed)
EMS here to pick up pt and bring back home

## 2022-02-05 NOTE — ED Notes (Signed)
[facility] called for transport. 

## 2022-02-05 NOTE — ED Notes (Signed)
SW/ CM at BS ?

## 2022-02-05 NOTE — ED Notes (Signed)
Tolerating POs, no emesis.

## 2022-02-05 NOTE — ED Triage Notes (Signed)
Patient to ER via [facility] from [place] where she is currently living, with complaints of nausea and vomiting x4 days. Reports taking metoclopramide but has been unable to keep it down. Reports hx of the same and states zofran does not help her symptoms. Reports clear coloured emesis.  Patient on 2L via [place] chronic. Non-ambulatory at baseline. Has PT visit her x2 weekly.  EMS VSS.

## 2022-02-05 NOTE — ED Notes (Signed)
In Room to discharge pt, pt states she still has nausea and is not ready to go home. Pt advised provider sending her home with RX for zofran. Pt states she has that at home and it does not improve nausea. Pt states " im just not comfortable going home when everyone can eat but me." Primary nurse made aware.

## 2022-02-05 NOTE — ED Provider Notes (Signed)
Procedures  Clinical Course as of 02/05/22 2003  Mon Feb 05, 2022  1506 Magnesium(!): 1.4 [KM]    Clinical Course User Index [KM] Georga Hacking, MD    ----------------------------------------- 8:03 PM on 02/05/2022 -----------------------------------------  Labs show mild electrolyte abnormalities which have been repleted.  Patient is feeling better sitting upright and tolerating oral intake.  Does not require hospitalization due to symptomatic improvement after antiemetics and hydration and now tolerating p.o.  Stable for discharge.    Sharman Cheek, MD 02/05/22 2003

## 2022-02-05 NOTE — ED Notes (Addendum)
Pt arrives to exam room via recliner, transferred to stretcher using EMS tarp/slide. PT alert, NAD, calm, interactive, resps e/u, speaking in clear complete sentences. Remains on 2L [place]. Initial VS, and labs, reviewed. Potassium remarkably low 2.8. Family at [facility].

## 2022-02-05 NOTE — ED Notes (Signed)
Pt discharge information reviewed. Pt understands need for follow up care and when to return if symptoms worsen. All questions answered. Pt is alert and oriented with even and regular respirations. Pt is to be brought out of department in stretcher back to [facility] lodge. Finance called and notified that pt will be returning home.

## 2022-02-05 NOTE — ED Notes (Signed)
Attempting EKG, missing leads despite accurate set up, trouble shooting.

## 2022-02-05 NOTE — ED Notes (Signed)
EDP at BS 

## 2022-02-05 NOTE — ED Provider Notes (Signed)
[facility] Provider Note    Event Date/Time   First MD Initiated Contact with Patient 02/05/22 1238     (approximate)   History   Emesis   HPI  Tiffany [name] is a 61 y.o. 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  Over the last 4 days patient has not been able to tolerate p.o.  She has Reglan at home but every time she takes that she vomits it.  Even liquids and water she is throwing up.  She now feels weak.  She denies abdominal pain diarrhea or constipation.  Having normal bowel movements.  Denies fevers chills shortness of breath or chest pain.  Patient has had similar episodes in the past.  In November patient was admitted for similar, during which she received IV fluids and electrolyte supplementation.  GI was consulted at this time who recommended continuing the PPI.  Prior to that she had admission at [facility] in August had an upper endoscopy that showed a diverticulum in the distal esophagus and esophagitis.    Past Medical History:  Diagnosis Date   Asthma    Bipolar 1 disorder ([place])    COPD (chronic obstructive pulmonary disease) (HCC)    Enlarged heart    GERD (gastroesophageal reflux disease)    Migraines     Patient Active Problem List   Diagnosis Date Noted   Hypoxia    Acute respiratory failure with hypoxia and hypercarbia (HCC)    Ambulatory dysfunction    Primary hypertension    Encephalopathy 11/13/2021   Hypokalemia 10/29/2021   Frequent falls 10/29/2021   Refractory nausea and vomiting 10/29/2021   Thrombocytopenia ([place]) 10/29/2021   GERD (gastroesophageal reflux disease) 10/29/2021     Physical Exam  Triage Vital Signs: ED Triage Vitals  Enc Vitals Group     BP 02/05/22 1216 136/71     Pulse Rate 02/05/22 1216 80     Resp 02/05/22 1216 17     Temp 02/05/22 1216 99 F (37.2 C)     Temp Source  02/05/22 1216 Oral     SpO2 02/05/22 1216 99 %     Weight --      Height 02/05/22 1217 5\' 9"  (1.753 m)     Head Circumference --      Peak Flow --      Pain Score 02/05/22 1217 0     Pain Loc --      Pain Edu? --      Excl. in [place]? --     Most recent vital signs: Vitals:   02/05/22 1430 02/05/22 1445  BP: 112/69 126/80  Pulse: 77 80  Resp:    Temp:    SpO2: 100% 100%     General: Awake, no distress.  Appears chronically ill, obese CV:  Good peripheral perfusion.  Resp:  Normal effort.  Abd:  No distention.  [place] soft and nontender throughout Neuro:             Awake, Alert, Oriented x 3  Other:  Mucous membranes are dry   ED Results / Procedures / Treatments  Labs (all labs ordered are listed, but only abnormal results are displayed) Labs Reviewed  COMPREHENSIVE METABOLIC PANEL - Abnormal; Notable for the following components:      Result Value   Potassium 2.8 (*)    Chloride 93 (*)    Calcium  8.3 (*)    Total Protein 6.4 (*)    Albumin 2.5 (*)    All other components within normal limits  CBC - Abnormal; Notable for the following components:   Hemoglobin 11.0 (*)    HCT 35.0 (*)    All other components within normal limits  URINALYSIS, ROUTINE W REFLEX MICROSCOPIC - Abnormal; Notable for the following components:   APPearance CLEAR (*)    Bilirubin Urine LARGE (*)    Ketones, ur 40 (*)    Protein, ur 100 (*)    All other components within normal limits  LIPASE, BLOOD  PREGNANCY, URINE  MAGNESIUM     EKG     RADIOLOGY    PROCEDURES:  Critical Care performed: No  .1-3 Lead EKG Interpretation Performed by: Rada Hay, MD Authorized by: Rada Hay, MD     Interpretation: normal     ECG rate assessment: normal     Rhythm: sinus rhythm     Ectopy: none     Conduction: normal    The patient is on the cardiac monitor to evaluate for evidence of arrhythmia and/or significant heart rate changes.   MEDICATIONS ORDERED IN  ED: Medications  potassium chloride 10 mEq in 100 mL IVPB (10 mEq Intravenous New Bag/Given 02/05/22 1503)  sodium chloride 0.9 % bolus 1,000 mL (1,000 mLs Intravenous New Bag/Given 02/05/22 1451)  ondansetron (ZOFRAN) injection 4 mg (4 mg Intravenous Given 02/05/22 1451)     IMPRESSION / MDM / ASSESSMENT AND PLAN / ED COURSE  I reviewed the triage vital signs and the nursing notes.                              Differential diagnosis includes, but is not limited to, esophagitis, reflux, gastroparesis, infection, gastric outlet obstruction  Patient is a 61 year old female with multiple comorbidities presents with nausea and vomiting x4 days.  Has had multiple similar presentations in the past where she has been found to be hypokalemic hypomagnesemic and hypophosphatemic.  She denies any abdominal pain currently.  On exam overall she is alert and oriented abdomen is soft and nontender does appear somewhat dehydrated.  Vital signs within normal limits.  Reviewed her labs which are notable for hypokalemia with a potassium of 2.8.  We will add on a mag.  Given patient is significantly nauseous will give a liter of fluids and give IV Zofran and IV potassium supplementation.  We will try for p.o. potassium supplementation as well if able to tolerate.  At the time of signout she is pending fluids Zofran and p.o. challenge.    Clinical Course as of 02/05/22 1506  Mon Feb 05, 2022  1506 Magnesium(!): 1.4 [KM]    Clinical Course User Index [KM] Rada Hay, MD     FINAL CLINICAL IMPRESSION(S) / ED DIAGNOSES   Final diagnoses:  Nausea and vomiting, unspecified vomiting type  Hypokalemia     Rx / DC Orders   ED Discharge Orders     None        Note:  This document was prepared using Dragon voice recognition software and may include unintentional dictation errors.   Rada Hay, MD 02/05/22 [phone]

## 2022-02-07 ENCOUNTER — Emergency Department: Payer: Medicaid Other

## 2022-02-07 ENCOUNTER — Inpatient Hospital Stay
Admission: EM | Admit: 2022-02-07 | Discharge: 2022-02-17 | DRG: 392 | Disposition: A | Discharge status: Home / Self Care | Payer: Medicaid Other | Attending: Hospitalist | Admitting: Hospitalist

## 2022-02-07 ENCOUNTER — Other Ambulatory Visit: Payer: Self-pay

## 2022-02-07 ENCOUNTER — Encounter: Payer: Self-pay | Admitting: Emergency Medicine

## 2022-02-07 DIAGNOSIS — E785 Hyperlipidemia, unspecified: Secondary | ICD-10-CM | POA: Diagnosis present

## 2022-02-07 DIAGNOSIS — E871 Hypo-osmolality and hyponatremia: Secondary | ICD-10-CM | POA: Diagnosis present

## 2022-02-07 DIAGNOSIS — R638 Other symptoms and signs concerning food and fluid intake: Secondary | ICD-10-CM | POA: Clinically undetermined

## 2022-02-07 DIAGNOSIS — I10 Essential (primary) hypertension: Secondary | ICD-10-CM | POA: Diagnosis present

## 2022-02-07 DIAGNOSIS — Z91012 Allergy to eggs: Secondary | ICD-10-CM

## 2022-02-07 DIAGNOSIS — R29810 Facial weakness: Secondary | ICD-10-CM | POA: Diagnosis present

## 2022-02-07 DIAGNOSIS — Z8249 Family history of ischemic heart disease and other diseases of the circulatory system: Secondary | ICD-10-CM

## 2022-02-07 DIAGNOSIS — Q396 Congenital diverticulum of esophagus: Secondary | ICD-10-CM

## 2022-02-07 DIAGNOSIS — Z8669 Personal history of other diseases of the nervous system and sense organs: Secondary | ICD-10-CM

## 2022-02-07 DIAGNOSIS — R112 Nausea with vomiting, unspecified: Secondary | ICD-10-CM | POA: Diagnosis not present

## 2022-02-07 DIAGNOSIS — L8990 Pressure ulcer of unspecified site, unspecified stage: Secondary | ICD-10-CM | POA: Diagnosis present

## 2022-02-07 DIAGNOSIS — J449 Chronic obstructive pulmonary disease, unspecified: Secondary | ICD-10-CM | POA: Diagnosis present

## 2022-02-07 DIAGNOSIS — K2970 Gastritis, unspecified, without bleeding: Principal | ICD-10-CM | POA: Diagnosis present

## 2022-02-07 DIAGNOSIS — K529 Noninfective gastroenteritis and colitis, unspecified: Secondary | ICD-10-CM | POA: Diagnosis present

## 2022-02-07 DIAGNOSIS — Z9981 Dependence on supplemental oxygen: Secondary | ICD-10-CM

## 2022-02-07 DIAGNOSIS — K449 Diaphragmatic hernia without obstruction or gangrene: Secondary | ICD-10-CM

## 2022-02-07 DIAGNOSIS — Z91011 Allergy to milk products: Secondary | ICD-10-CM

## 2022-02-07 DIAGNOSIS — Z20822 Contact with and (suspected) exposure to covid-19: Secondary | ICD-10-CM | POA: Diagnosis present

## 2022-02-07 DIAGNOSIS — Z888 Allergy status to other drugs, medicaments and biological substances status: Secondary | ICD-10-CM

## 2022-02-07 DIAGNOSIS — Z79899 Other long term (current) drug therapy: Secondary | ICD-10-CM

## 2022-02-07 DIAGNOSIS — Z9049 Acquired absence of other specified parts of digestive tract: Secondary | ICD-10-CM

## 2022-02-07 DIAGNOSIS — D696 Thrombocytopenia, unspecified: Secondary | ICD-10-CM | POA: Diagnosis present

## 2022-02-07 DIAGNOSIS — J45909 Unspecified asthma, uncomplicated: Secondary | ICD-10-CM | POA: Diagnosis present

## 2022-02-07 DIAGNOSIS — F319 Bipolar disorder, unspecified: Secondary | ICD-10-CM | POA: Diagnosis present

## 2022-02-07 DIAGNOSIS — Z90710 Acquired absence of both cervix and uterus: Secondary | ICD-10-CM

## 2022-02-07 DIAGNOSIS — E878 Other disorders of electrolyte and fluid balance, not elsewhere classified: Secondary | ICD-10-CM | POA: Diagnosis present

## 2022-02-07 DIAGNOSIS — Z6841 Body Mass Index (BMI) 40.0 and over, adult: Secondary | ICD-10-CM

## 2022-02-07 DIAGNOSIS — E876 Hypokalemia: Secondary | ICD-10-CM | POA: Diagnosis not present

## 2022-02-07 DIAGNOSIS — K2100 Gastro-esophageal reflux disease with esophagitis, without bleeding: Secondary | ICD-10-CM | POA: Diagnosis present

## 2022-02-07 DIAGNOSIS — Z7951 Long term (current) use of inhaled steroids: Secondary | ICD-10-CM

## 2022-02-07 DIAGNOSIS — Z8719 Personal history of other diseases of the digestive system: Secondary | ICD-10-CM

## 2022-02-07 DIAGNOSIS — G43909 Migraine, unspecified, not intractable, without status migrainosus: Secondary | ICD-10-CM | POA: Diagnosis present

## 2022-02-07 DIAGNOSIS — K219 Gastro-esophageal reflux disease without esophagitis: Secondary | ICD-10-CM | POA: Diagnosis present

## 2022-02-07 LAB — COMPREHENSIVE METABOLIC PANEL
ALT: 11 U/L (ref 0–44)
AST: 19 U/L (ref 15–41)
Albumin: 2 g/dL — ABNORMAL LOW (ref 3.5–5.0)
Alkaline Phosphatase: 44 U/L (ref 38–126)
Anion gap: 9 (ref 5–15)
BUN: 9 mg/dL (ref 6–20)
CO2: 31 mmol/L (ref 22–32)
Calcium: 7.9 mg/dL — ABNORMAL LOW (ref 8.9–10.3)
Chloride: 94 mmol/L — ABNORMAL LOW (ref 98–111)
Creatinine, Ser: 0.61 mg/dL (ref 0.44–1.00)
GFR, Estimated: >60 mL/min (ref >60)
Glucose, Bld: 96 mg/dL (ref 70–99)
Potassium: 2.7 mmol/L — CL (ref 3.5–5.1)
Sodium: 134 mmol/L — ABNORMAL LOW (ref 135–145)
Total Bilirubin: 0.9 mg/dL (ref 0.3–1.2)
Total Protein: 5.4 g/dL — ABNORMAL LOW (ref 6.5–8.1)

## 2022-02-07 LAB — MAGNESIUM: Magnesium: 1.4 mg/dL — ABNORMAL LOW (ref 1.7–2.4)

## 2022-02-07 LAB — CBC
HCT: 31 % — ABNORMAL LOW (ref 36.0–46.0)
Hemoglobin: 9.9 g/dL — ABNORMAL LOW (ref 12.0–15.0)
MCH: 27.3 pg (ref 26.0–34.0)
MCHC: 31.9 g/dL (ref 30.0–36.0)
MCV: 85.6 fL (ref 80.0–100.0)
Platelets: 140 10*3/uL — ABNORMAL LOW (ref 150–400)
RBC: 3.62 MIL/uL — ABNORMAL LOW (ref 3.87–5.11)
RDW: 15.2 % (ref 11.5–15.5)
WBC: 6.6 10*3/uL (ref 4.0–10.5)
nRBC: 0 % (ref 0.0–0.2)

## 2022-02-07 LAB — LIPASE, BLOOD: Lipase: 24 U/L (ref 11–51)

## 2022-02-07 LAB — RESP PANEL BY RT-PCR (FLU A&B, COVID) ARPGX2
Influenza A by PCR: NEGATIVE
Influenza B by PCR: NEGATIVE
SARS Coronavirus 2 by RT PCR: NEGATIVE

## 2022-02-07 MED ORDER — POTASSIUM CHLORIDE IN NACL 20-0.9 MEQ/L-% IV SOLN
INTRAVENOUS | Status: DC
  Filled 2022-02-07 (×7): qty 1000

## 2022-02-07 MED ORDER — TIOTROPIUM BROMIDE MONOHYDRATE 18 MCG IN CAPS
1.0000 | ORAL_CAPSULE | Freq: Every day | RESPIRATORY_TRACT | Status: DC
  Administered 2022-02-08 – 2022-02-13 (×5): 18 ug via RESPIRATORY_TRACT
  Filled 2022-02-07: qty 5

## 2022-02-07 MED ORDER — ATORVASTATIN CALCIUM 20 MG PO TABS
80.0000 mg | ORAL_TABLET | Freq: Every day | ORAL | Status: DC
  Administered 2022-02-08 – 2022-02-09 (×2): 80 mg via ORAL
  Filled 2022-02-07 (×2): qty 4

## 2022-02-07 MED ORDER — FOLIC ACID 1 MG PO TABS
5.0000 mg | ORAL_TABLET | Freq: Every day | ORAL | Status: DC
  Administered 2022-02-08: 5 mg via ORAL
  Filled 2022-02-07 (×2): qty 5

## 2022-02-07 MED ORDER — VITAMIN D (ERGOCALCIFEROL) 1.25 MG (50000 UNIT) PO CAPS
50000.0000 [IU] | ORAL_CAPSULE | ORAL | Status: DC
Start: 2022-02-07 — End: 2022-02-08

## 2022-02-07 MED ORDER — ONDANSETRON HCL 4 MG PO TABS: 4.0000 mg | ORAL_TABLET | Freq: Four times a day (QID) | ORAL | Status: DC | PRN

## 2022-02-07 MED ORDER — ADULT MULTIVITAMIN W/MINERALS CH
1.0000 | ORAL_TABLET | Freq: Every day | ORAL | Status: DC
  Administered 2022-02-09: 1 via ORAL
  Filled 2022-02-07 (×2): qty 1

## 2022-02-07 MED ORDER — LACTATED RINGERS IV BOLUS
1000.0000 mL | Freq: Once | INTRAVENOUS | Status: AC
  Administered 2022-02-07: 1000 mL via INTRAVENOUS

## 2022-02-07 MED ORDER — BUDESONIDE 0.25 MG/2ML IN SUSP
0.2500 mg | Freq: Two times a day (BID) | RESPIRATORY_TRACT | Status: DC
  Administered 2022-02-08 – 2022-02-17 (×18): 0.25 mg via RESPIRATORY_TRACT
  Filled 2022-02-07 (×19): qty 2

## 2022-02-07 MED ORDER — LORATADINE 10 MG PO TABS
10.0000 mg | ORAL_TABLET | Freq: Every evening | ORAL | Status: DC
  Administered 2022-02-07 – 2022-02-08 (×2): 10 mg via ORAL
  Filled 2022-02-07 (×2): qty 1

## 2022-02-07 MED ORDER — DULOXETINE HCL 30 MG PO CPEP
30.0000 mg | ORAL_CAPSULE | Freq: Every day | ORAL | Status: DC
  Administered 2022-02-08 – 2022-02-09 (×2): 30 mg via ORAL
  Filled 2022-02-07 (×7): qty 1

## 2022-02-07 MED ORDER — SODIUM CHLORIDE 0.9 % IV SOLN
25.0000 mg | Freq: Once | INTRAVENOUS | Status: AC
  Administered 2022-02-07: 25 mg via INTRAVENOUS
  Filled 2022-02-07: qty 1

## 2022-02-07 MED ORDER — ONDANSETRON HCL 4 MG/2ML IJ SOLN
4.0000 mg | Freq: Four times a day (QID) | INTRAMUSCULAR | Status: DC | PRN
  Administered 2022-02-09 – 2022-02-12 (×3): 4 mg via INTRAVENOUS
  Filled 2022-02-07 (×3): qty 2

## 2022-02-07 MED ORDER — DICLOFENAC SODIUM 1 % EX GEL
4.0000 g | Freq: Four times a day (QID) | CUTANEOUS | Status: DC
  Filled 2022-02-07: qty 100

## 2022-02-07 MED ORDER — PANTOPRAZOLE SODIUM 40 MG IV SOLR
40.0000 mg | Freq: Two times a day (BID) | INTRAVENOUS | Status: DC
  Administered 2022-02-07 – 2022-02-14 (×14): 40 mg via INTRAVENOUS
  Filled 2022-02-07 (×13): qty 10

## 2022-02-07 MED ORDER — LAMOTRIGINE 25 MG PO TABS
50.0000 mg | ORAL_TABLET | Freq: Every day | ORAL | Status: DC
  Administered 2022-02-08 – 2022-02-09 (×2): 50 mg via ORAL
  Filled 2022-02-07 (×7): qty 2

## 2022-02-07 MED ORDER — SUMATRIPTAN SUCCINATE 50 MG PO TABS: 100.0000 mg | ORAL_TABLET | Freq: Every day | ORAL | Status: DC | PRN

## 2022-02-07 MED ORDER — MAGNESIUM HYDROXIDE 400 MG/5ML PO SUSP: 30.0000 mL | Freq: Every day | ORAL | Status: DC | PRN

## 2022-02-07 MED ORDER — IOHEXOL 300 MG/ML  SOLN
100.0000 mL | Freq: Once | INTRAMUSCULAR | Status: AC | PRN
  Administered 2022-02-07: 100 mL via INTRAVENOUS
  Filled 2022-02-07: qty 100

## 2022-02-07 MED ORDER — ACETAMINOPHEN 325 MG PO TABS
650.0000 mg | ORAL_TABLET | Freq: Four times a day (QID) | ORAL | Status: DC | PRN
  Filled 2022-02-07: qty 2

## 2022-02-07 MED ORDER — ONDANSETRON 4 MG PO TBDP: 4.0000 mg | ORAL_TABLET | Freq: Three times a day (TID) | ORAL | Status: DC | PRN

## 2022-02-07 MED ORDER — POTASSIUM CHLORIDE 10 MEQ/100ML IV SOLN
10.0000 meq | Freq: Once | INTRAVENOUS | Status: AC
  Administered 2022-02-07: 10 meq via INTRAVENOUS
  Filled 2022-02-07: qty 100

## 2022-02-07 MED ORDER — FAMOTIDINE 20 MG PO TABS
20.0000 mg | ORAL_TABLET | Freq: Two times a day (BID) | ORAL | Status: DC
  Administered 2022-02-07 – 2022-02-08 (×3): 20 mg via ORAL
  Filled 2022-02-07 (×4): qty 1

## 2022-02-07 MED ORDER — TRAZODONE HCL 100 MG PO TABS
100.0000 mg | ORAL_TABLET | Freq: Every evening | ORAL | Status: DC | PRN
  Filled 2022-02-07 (×2): qty 1

## 2022-02-07 MED ORDER — POLYETHYLENE GLYCOL 3350 17 G PO PACK
17.0000 g | PACK | Freq: Two times a day (BID) | ORAL | Status: DC
  Administered 2022-02-14: 17 g via ORAL
  Filled 2022-02-07 (×12): qty 1

## 2022-02-07 MED ORDER — TRAZODONE HCL 50 MG PO TABS: 25.0000 mg | ORAL_TABLET | Freq: Every evening | ORAL | Status: DC | PRN

## 2022-02-07 MED ORDER — KATE FARMS STANDARD 1.4 PO LIQD: 325.0000 mL | Freq: Three times a day (TID) | ORAL | Status: DC

## 2022-02-07 MED ORDER — METOCLOPRAMIDE HCL 5 MG/ML IJ SOLN
10.0000 mg | Freq: Four times a day (QID) | INTRAMUSCULAR | Status: DC
  Administered 2022-02-07 – 2022-02-12 (×18): 10 mg via INTRAVENOUS
  Filled 2022-02-07 (×18): qty 2

## 2022-02-07 MED ORDER — ACETAMINOPHEN 650 MG RE SUPP: 650.0000 mg | Freq: Four times a day (QID) | RECTAL | Status: DC | PRN

## 2022-02-07 MED ORDER — ONDANSETRON HCL 4 MG/2ML IJ SOLN
4.0000 mg | Freq: Once | INTRAMUSCULAR | Status: AC
  Administered 2022-02-07: 4 mg via INTRAVENOUS
  Filled 2022-02-07: qty 2

## 2022-02-07 MED ORDER — MAGNESIUM SULFATE 4 GM/100ML IV SOLN
4.0000 g | Freq: Once | INTRAVENOUS | Status: AC
  Administered 2022-02-07: 4 g via INTRAVENOUS
  Filled 2022-02-07: qty 100

## 2022-02-07 NOTE — ED Provider Notes (Signed)
[facility] Provider Note    Event Date/Time   First MD Initiated Contact with Patient 02/07/22 1326     (approximate)   History   Chief Complaint No chief complaint on file.   HPI Tiffany [name] is a 61 y.o. female, history of hypertension, GERD, bipolar, thrombocytopenia, presents to the emergency department for evaluation of nausea/vomiting.  Patient states that her symptoms began approximately 6 days ago.  Reports persistent nausea/vomiting, up to 5-6 episodes of vomiting per day.  No blood in vomit.  No diarrhea.  Denies fever/chills, chest pain, shortness of breath, abdominal pain, back pain, flank pain, urinary symptoms, diarrhea, or blood in stool.  Per external records review, patient was seen here on 02/05/2021.  She was treated with IV fluids, electrolyte supplementation, and discharged with antiemetics.  History Limitations: No limitations.      Physical Exam  Triage Vital Signs: ED Triage Vitals  Enc Vitals Group     BP 02/07/22 1221 117/75     Pulse Rate 02/07/22 1221 86     Resp 02/07/22 1221 17     Temp 02/07/22 1221 98.5 F (36.9 C)     Temp Source 02/07/22 1221 Oral     SpO2 02/07/22 1221 100 %     Weight 02/07/22 1213 289 lb 0.4 oz (131.1 kg)     Height 02/07/22 1213 5\' 9"  (1.753 m)     Head Circumference --      Peak Flow --      Pain Score 02/07/22 1213 0     Pain Loc --      Pain Edu? --      Excl. in [place]? --     Most recent vital signs: Vitals:   02/07/22 2000 02/07/22 2100  BP: 123/77 120/74  Pulse:  80  Resp: 16 18  Temp: 98 F (36.7 C) 98.8 F (37.1 C)  SpO2: 98% 99%    General: Awake, NAD.  CV: Good peripheral perfusion.  S1 and S2 present.  No murmurs rubs or gallops. Resp: Normal effort.  Lung sounds clear bilaterally in the apices and bases. Abd: Soft, non-tender. No distention.  No CVA tenderness. Neuro: At baseline. No gross neurological deficits. Other: Patient is unable to ambulate on her own due  to reported stroke a few months back.  She is normally wheelchair bound.  Physical Exam    ED Results / Procedures / Treatments  Labs (all labs ordered are listed, but only abnormal results are displayed) Labs Reviewed  COMPREHENSIVE METABOLIC PANEL - Abnormal; Notable for the following components:      Result Value   Sodium 134 (*)    Potassium 2.7 (*)    Chloride 94 (*)    Calcium 7.9 (*)    Total Protein 5.4 (*)    Albumin 2.0 (*)    All other components within normal limits  CBC - Abnormal; Notable for the following components:   RBC 3.62 (*)    Hemoglobin 9.9 (*)    HCT 31.0 (*)    Platelets 140 (*)    All other components within normal limits  MAGNESIUM - Abnormal; Notable for the following components:   Magnesium 1.4 (*)    All other components within normal limits  RESP PANEL BY RT-PCR (FLU A&B, COVID) ARPGX2  LIPASE, BLOOD  URINALYSIS, ROUTINE W REFLEX MICROSCOPIC  BASIC METABOLIC PANEL  CBC     EKG Sinus rhythm, rate of 89, no ST segment changes, no  axis deviations, no AV blocks   RADIOLOGY  ED Provider Interpretation: I personally reviewed and interpreted this image.  Prominent appendix, otherwise no acute findings.  CT Abdomen Pelvis W Contrast  Result Date: 02/07/2022 CLINICAL DATA:  Nausea and vomiting for 2 days. EXAM: CT ABDOMEN AND PELVIS WITH CONTRAST TECHNIQUE: Multidetector CT imaging of the abdomen and pelvis was performed using the standard protocol following bolus administration of intravenous contrast. RADIATION DOSE REDUCTION: This exam was performed according to the departmental dose-optimization program which includes automated exposure control, adjustment of the mA and/or kV according to patient size and/or use of iterative reconstruction technique. CONTRAST:  100mL OMNIPAQUE IOHEXOL 300 MG/ML  SOLN COMPARISON:  Abdominopelvic CT 08/12/2021. FINDINGS: Lower chest: Clear lung bases. No significant pleural or pericardial effusion. Moderate size  hiatal hernia appears similar to the previous study, with possible associated wall thickening. Hepatobiliary: The liver is normal in density without suspicious focal abnormality. Probable dependent sludge in the gallbladder lumen. No evidence of gallstones, gallbladder wall thickening or surrounding inflammation. No evidence of biliary ductal dilatation. Pancreas: Fatty replaced. No evidence of ductal dilatation or surrounding inflammation. Spleen: Normal in size without focal abnormality. Adrenals/Urinary Tract: Both adrenal glands appear normal. The kidneys appear normal without evidence of urinary tract calculus, suspicious lesion or hydronephrosis. No bladder abnormalities are seen. Stomach/Bowel: No enteric contrast administered. As above, moderate-size hiatal hernia. The stomach otherwise appears unremarkable for its degree of distension. No evidence of bowel wall thickening, distention or surrounding inflammatory change. Prior study suggested previous appendectomy. Currently, there is a mildly prominent tubular structure projecting inferiorly from the cecal tip which appears blind-ending and likely reflects the appendix. This measures up to 8 mm in diameter, but demonstrates no surrounding inflammation. Vascular/Lymphatic: There are no enlarged abdominal or pelvic lymph nodes. No acute vascular findings. Scattered retroperitoneal phleboliths. Reproductive: Hysterectomy. Stable appearance of the left ovary. No suspicious adnexal findings. Other: No evidence of abdominal wall mass or hernia. No ascites. Musculoskeletal: No acute or significant osseous findings. Generalized muscular atrophy. IMPRESSION: 1. No definite acute findings. Apparent prominence of the appendix without surrounding inflammatory changes to suggest acute appendicitis in this patient without leukocytosis. Recommend clinical follow up. 2. Moderate size hiatal hernia.  No evidence of bowel obstruction. 3. Previous hysterectomy. Electronically  Signed   By: Carey BullocksWilliam  Veazey M.D.   On: 02/07/2022 16:20    PROCEDURES:  Critical Care performed: None.  Procedures    MEDICATIONS ORDERED IN ED: Medications  0.9 % NaCl with KCl 20 mEq/ L  infusion (has no administration in time range)  acetaminophen (TYLENOL) tablet 650 mg (has no administration in time range)    Or  acetaminophen (TYLENOL) suppository 650 mg (has no administration in time range)  ondansetron (ZOFRAN) tablet 4 mg (has no administration in time range)    Or  ondansetron (ZOFRAN) injection 4 mg (has no administration in time range)  magnesium hydroxide (MILK OF MAGNESIA) suspension 30 mL (has no administration in time range)  metoCLOPramide (REGLAN) injection 10 mg (10 mg Intravenous Given 02/07/22 2216)  pantoprazole (PROTONIX) injection 40 mg (40 mg Intravenous Given 02/07/22 2216)  SUMAtriptan (IMITREX) tablet 100 mg (has no administration in time range)  atorvastatin (LIPITOR) tablet 80 mg (has no administration in time range)  DULoxetine (CYMBALTA) DR capsule 30 mg (has no administration in time range)  traZODone (DESYREL) tablet 100 mg (has no administration in time range)  famotidine (PEPCID) tablet 20 mg (20 mg Oral Given 02/07/22 2215)  polyethylene glycol (  MIRALAX / GLYCOLAX) packet 17 g (17 g Oral Patient Refused/Not Given 02/07/22 2215)  folic acid (FOLVITE) tablet 5 mg (has no administration in time range)  lamoTRIgine (LAMICTAL) tablet 50 mg (50 mg Oral Patient Refused/Not Given 02/07/22 2225)  multivitamin with minerals tablet 1 tablet (has no administration in time range)  feeding supplement (KATE FARMS STANDARD 1.4) liquid 325 mL (has no administration in time range)  Vitamin D (Ergocalciferol) (DRISDOL) capsule 50,000 Units (50,000 Units Oral Patient Refused/Not Given 02/07/22 2224)  budesonide (PULMICORT) nebulizer solution 0.25 mg (has no administration in time range)  loratadine (CLARITIN) tablet 10 mg (10 mg Oral Given 02/07/22 2215)  tiotropium (SPIRIVA)  inhalation capsule ([facility] use ONLY) 18 mcg (has no administration in time range)  diclofenac Sodium (VOLTAREN) 1 % topical gel 4 g (4 g Topical Patient Refused/Not Given 02/07/22 2215)  potassium chloride 10 mEq in 100 mL IVPB (0 mEq Intravenous Stopped 02/07/22 1740)  lactated ringers bolus 1,000 mL (0 mLs Intravenous Stopped 02/07/22 1701)  promethazine (PHENERGAN) 25 mg in sodium chloride 0.9 % 50 mL IVPB (0 mg Intravenous Stopped 02/07/22 1630)  magnesium sulfate IVPB 4 g 100 mL (0 g Intravenous Stopped 02/07/22 1936)  iohexol (OMNIPAQUE) 300 MG/ML solution 100 mL (100 mLs Intravenous Contrast Given 02/07/22 1548)  ondansetron (ZOFRAN) injection 4 mg (4 mg Intravenous Given 02/07/22 2216)     IMPRESSION / MDM / ASSESSMENT AND PLAN / ED COURSE  I reviewed the triage vital signs and the nursing notes.                              RAJVI ARMENTOR is a 60 y.o. female, history of hypertension, GERD, bipolar, thrombocytopenia, presents to the emergency department for evaluation of nausea/vomiting.  Patient states that her symptoms began approximately 6 days ago.  Reports persistent nausea/vomiting, up to 5-6 episodes of vomiting per day.  Differential diagnosis includes, but is not limited to, appendicitis, gastroenteritis, DKA, cholecystitis, pancreatitis, hypokalemia, hypomagnesemia  ED Course CBC notable for decreased hemoglobin at 9.9 from 11.0 two days prior.  Thrombocytopenic at 140 from 248 two days prior.  No leukocytosis.  CMP notable for hypokalemia at 2.7.  Will initiate IV potassium chloride.  Low albumin at 2.0.  No transaminitis.  No anion gap.  Magnesium 1.4.  Will supplement with IV magnesium sulfate 4 g  Lipase unremarkable at 24.  Unlikely pancreatitis  Abdominal CT overall shows no acute findings, though interestingly does show prominent appendix.  Given unremarkable exam, no leukocytosis, and no endorsement of abdominal pain, unlikely appendicitis.  Upon reexamination, patient is  still unable to hold down any liquids without spitting it back up. She is still currently endorsing nausea. After observing her drink water, the fluid appears to be coming out quite suddenly, possibly suggestive of esophageal dysfunction.    Given that she is failing p.o. challenges despite antiemetic treatment, we will go ahead and consult hospitalist for admission. Admission accepted by Dr. Arville Care at [phone].           FINAL CLINICAL IMPRESSION(S) / ED DIAGNOSES   Final diagnoses:  Hypomagnesemia  Hypokalemia     Rx / DC Orders   ED Discharge Orders     None        Note:  This document was prepared using Dragon voice recognition software and may include unintentional dictation errors.   Varney Daily, [place] 02/07/22 2239    Augusto Gamble  Okey Dupre, MD 02/07/22 2245

## 2022-02-07 NOTE — ED Triage Notes (Signed)
First Nurse Note:  Arrives via [facility] with C/O vomiting and nausea.  Seen Monday for same.  Has been taking Zofran as prescribed, and emesis continues.  Per report, unable to tolerate PO fluids.    136/88 99% 2L/ [place]

## 2022-02-07 NOTE — ED Notes (Signed)
Pt taken to ct 

## 2022-02-07 NOTE — H&P (Signed)
[place]   PATIENT NAME: Tiffany [name]    MR#:  [id number]  DATE OF BIRTH:  January 24, 1961  DATE OF ADMISSION:  02/07/2022  PRIMARY CARE PHYSICIAN: Nelle Don, MD   Patient is coming from: Home  REQUESTING/REFERRING PHYSICIAN: Teodoro Spray, PA-C  CHIEF COMPLAINT:  Left facial droop and numbness  HISTORY OF PRESENT ILLNESS:  Tiffany [name] is a 61 y.o. female with medical history significant for asthma, COPD, bipolar 1 disorder, GERD and migraine, who presented to the ER with acute onset of intractable nausea and vomiting for the last 8 days.  She denies any bilious vomitus or hematemesis.  No melena or bright red bleeding per rectum.  She denies any diarrhea but has been having lower abdominal discomfort more on the right than the left.  She admits to mild cough, excessive vomiting without wheezing or dyspnea.  No choking on food.  She denies any history of gastroparesis.  No dysuria, oliguria or hematuria or flank pain.  She was seen in the ER couple of days ago and her nausea and vomiting were managed and and her electrolytes abnormalities have been repleted.  She tolerated oral intake after antiemetics and hydration and therefore was thought to be stable for discharge.  ED Course: When she came to the ER, vital signs were within normal.  Pulse oximetry was 100% on 2 L of O2 via nasal cannula.  Labs revealed hypokalemia of 2.7 and mild hyponatremia hypochloremia and potassium of 7.9 with albumin of 2 and total protein of 5.4.  Her hemoglobin was 9.9 hematocrit 31 with platelets 140 worse than previous levels a couple of days ago.  Magnesium was 1.4, similar to 2 days ago influenza antigens and COVID-19 PCR came back negative.  UA was unremarkable a couple of days ago.   EKG as reviewed by me : EKG showed normal sinus rhythm with a rate of 89 with Imaging: 1. No definite acute findings. Apparent prominence of the appendix without surrounding inflammatory changes to  suggest acute appendicitis in this patient without leukocytosis. Recommend clinical follow up. 2. Moderate size hiatal hernia.  No evidence of bowel obstruction. 3. Previous hysterectomy.  The patient was given 1 L bolus of IV lactated Ringer, 10 V on IV potassium chloride, 25 mg of IV Phenergan and 4 g of IV magnesium sulfate.  She will be admitted to an observation medical monitored bed for further evaluation and management. PAST MEDICAL HISTORY:   Past Medical History:  Diagnosis Date   Asthma    Bipolar 1 disorder (HCC)    COPD (chronic obstructive pulmonary disease) (HCC)    Enlarged heart    GERD (gastroesophageal reflux disease)    Migraines     PAST SURGICAL HISTORY:   Past Surgical History:  Procedure Laterality Date   ABDOMINAL HYSTERECTOMY     ABDOMINAL SURGERY     "For acid reflux"   CESAREAN SECTION      SOCIAL HISTORY:   Social History   Tobacco Use   Smoking status: Never   Smokeless tobacco: Never  Substance Use Topics   Alcohol use: Never    FAMILY HISTORY:   Family History  Problem Relation Age of Onset   Hypertension Mother     DRUG ALLERGIES:   Allergies  Allergen Reactions   Dairy Aid [Tilactase] Other (See Comments)    Exacerbates asthma   Egg [Eggs Or Egg-Derived Products] Other (See Comments)    Exacerbated asthma  Milk-Related Compounds     REVIEW OF SYSTEMS:   ROS As per history of present illness. All pertinent systems were reviewed above. Constitutional, HEENT, cardiovascular, respiratory, GI, GU, musculoskeletal, neuro, psychiatric, endocrine, integumentary and hematologic systems were reviewed and are otherwise negative/unremarkable except for positive findings mentioned above in the HPI.   MEDICATIONS AT HOME:   Prior to Admission medications   Medication Sig Start Date End Date Taking? Authorizing Provider  acetaminophen (TYLENOL) 325 MG tablet Take 2 tablets (650 mg total) by mouth every 6 (six) hours as needed for  mild pain (or Fever >/= 101). 11/08/21   Nolberto Hanlon, MD  atorvastatin (LIPITOR) 80 MG tablet Take 80 mg by mouth daily.    [provider]  diclofenac Sodium (VOLTAREN) 1 % GEL Apply 4 g topically 4 (four) times daily. 11/08/21   Nolberto Hanlon, MD  DULoxetine (CYMBALTA) 30 MG capsule Take 30 mg by mouth daily. 09/05/21 09/05/22  [provider]  famotidine (PEPCID) 20 MG tablet Take 1 tablet (20 mg total) by mouth 2 (two) times daily. 02/05/22   Carrie Mew, MD  FLOVENT HFA 110 MCG/ACT inhaler Inhale 2 puffs into the lungs 2 (two) times daily. 10/20/21   [provider]  folic acid (FOLVITE) 1 MG tablet Take 5 tablets (5 mg total) by mouth daily. 11/09/21   Nolberto Hanlon, MD  lamoTRIgine (LAMICTAL) 25 MG tablet Take 50 mg by mouth daily. 12/13/21   [provider]  loratadine (CLARITIN) 10 MG tablet Take 1 tablet (10 mg total) by mouth every evening. 11/08/21   Nolberto Hanlon, MD  metoCLOPramide (REGLAN) 10 MG tablet Take 1 tablet (10 mg total) by mouth 3 (three) times daily before meals. 11/08/21   Nolberto Hanlon, MD  Multiple Vitamin (MULTIVITAMIN WITH MINERALS) TABS tablet Take 1 tablet by mouth daily. 11/09/21   Nolberto Hanlon, MD  Nutritional Supplements (FEEDING SUPPLEMENT, KATE FARMS STANDARD 1.4,) LIQD liquid Take 325 mLs by mouth 3 (three) times daily between meals. 11/08/21   Nolberto Hanlon, MD  ondansetron (ZOFRAN-ODT) 4 MG disintegrating tablet Take 1 tablet (4 mg total) by mouth every 8 (eight) hours as needed for nausea or vomiting. 02/05/22   Carrie Mew, MD  pantoprazole (PROTONIX) 40 MG tablet Take 40 mg by mouth 2 (two) times daily. 08/17/21   [provider]  polyethylene glycol (MIRALAX / GLYCOLAX) 17 g packet Take 17 g by mouth 2 (two) times daily. 11/08/21   Nolberto Hanlon, MD  SPIRIVA HANDIHALER 18 MCG inhalation capsule 1 capsule at bedtime. 08/02/21   [provider]  SUMAtriptan (IMITREX) 100 MG tablet Take 100 mg by mouth daily as  needed. 08/02/21   [provider]  traZODone (DESYREL) 100 MG tablet Take 1 tablet (100 mg total) by mouth at bedtime as needed for sleep. 11/08/21   Nolberto Hanlon, MD  Vitamin D, Ergocalciferol, (DRISDOL) 1.25 MG (50000 UNIT) CAPS capsule Take 1 capsule (50,000 Units total) by mouth every 7 (seven) days. 11/13/21   Nolberto Hanlon, MD      VITAL SIGNS:  Blood pressure 120/74, pulse 80, temperature 98.8 F (37.1 C), temperature source Oral, resp. rate 18, height 5\' 9"  (1.753 m), weight 131.1 kg, SpO2 99 %.  PHYSICAL EXAMINATION:  Physical Exam  GENERAL:  61 y.o.-year-old African-American female patient lying in the bed with no acute distress.  EYES: Pupils equal, round, reactive to light and accommodation. No scleral icterus. Extraocular muscles intact.  HEENT: Head atraumatic, normocephalic. Oropharynx and nasopharynx clear.  NECK:  Supple, no jugular venous distention. No thyroid enlargement, no tenderness.  LUNGS: Normal breath sounds bilaterally, no wheezing, rales,rhonchi or crepitation. No use of accessory muscles of respiration.  CARDIOVASCULAR: Regular rate and rhythm, S1, S2 normal. No murmurs, rubs, or gallops.  ABDOMEN: Soft, nondistended, with mild right lower quadrant and right upper quadrant tenderness without rebound tenderness guarding or rigidity.  Negative Murphy sign.  No significant tenderness at McBurney's point.  Bowel sounds present. No organomegaly or mass.  EXTREMITIES: No pedal edema, cyanosis, or clubbing.  NEUROLOGIC: Cranial nerves II through XII are intact. Muscle strength 5/5 in all extremities. Sensation intact. Gait not checked.  PSYCHIATRIC: The patient is alert and oriented x 3.  Normal affect and good eye contact. SKIN: No obvious rash, lesion, or ulcer.   LABORATORY PANEL:   CBC Recent Labs  Lab 02/07/22 1222  WBC 6.6  HGB 9.9*  HCT 31.0*  PLT 140*    ------------------------------------------------------------------------------------------------------------------  Chemistries  Recent Labs  Lab 02/07/22 1222  NA 134*  K 2.7*  CL 94*  CO2 31  GLUCOSE 96  BUN 9  CREATININE 0.61  CALCIUM 7.9*  MG 1.4*  AST 19  ALT 11  ALKPHOS 44  BILITOT 0.9   ------------------------------------------------------------------------------------------------------------------  Cardiac Enzymes No results for input(s): TROPONINI in the last 168 hours. ------------------------------------------------------------------------------------------------------------------  RADIOLOGY:  CT Abdomen Pelvis W Contrast  Result Date: 02/07/2022 CLINICAL DATA:  Nausea and vomiting for 2 days. EXAM: CT ABDOMEN AND PELVIS WITH CONTRAST TECHNIQUE: Multidetector CT imaging of the abdomen and pelvis was performed using the standard protocol following bolus administration of intravenous contrast. RADIATION DOSE REDUCTION: This exam was performed according to the departmental dose-optimization program which includes automated exposure control, adjustment of the mA and/or kV according to patient size and/or use of iterative reconstruction technique. CONTRAST:  152mL OMNIPAQUE IOHEXOL 300 MG/ML  SOLN COMPARISON:  Abdominopelvic CT 08/12/2021. FINDINGS: Lower chest: Clear lung bases. No significant pleural or pericardial effusion. Moderate size hiatal hernia appears similar to the previous study, with possible associated wall thickening. Hepatobiliary: The liver is normal in density without suspicious focal abnormality. Probable dependent sludge in the gallbladder lumen. No evidence of gallstones, gallbladder wall thickening or surrounding inflammation. No evidence of biliary ductal dilatation. Pancreas: Fatty replaced. No evidence of ductal dilatation or surrounding inflammation. Spleen: Normal in size without focal abnormality. Adrenals/Urinary Tract: Both adrenal glands appear  normal. The kidneys appear normal without evidence of urinary tract calculus, suspicious lesion or hydronephrosis. No bladder abnormalities are seen. Stomach/Bowel: No enteric contrast administered. As above, moderate-size hiatal hernia. The stomach otherwise appears unremarkable for its degree of distension. No evidence of bowel wall thickening, distention or surrounding inflammatory change. Prior study suggested previous appendectomy. Currently, there is a mildly prominent tubular structure projecting inferiorly from the cecal tip which appears blind-ending and likely reflects the appendix. This measures up to 8 mm in diameter, but demonstrates no surrounding inflammation. Vascular/Lymphatic: There are no enlarged abdominal or pelvic lymph nodes. No acute vascular findings. Scattered retroperitoneal phleboliths. Reproductive: Hysterectomy. Stable appearance of the left ovary. No suspicious adnexal findings. Other: No evidence of abdominal wall mass or hernia. No ascites. Musculoskeletal: No acute or significant osseous findings. Generalized muscular atrophy. IMPRESSION: 1. No definite acute findings. Apparent prominence of the appendix without surrounding inflammatory changes to suggest acute appendicitis in this patient without leukocytosis. Recommend clinical follow up. 2. Moderate size hiatal hernia.  No evidence of bowel obstruction. 3. Previous hysterectomy. Electronically Signed   By:  Richardean Sale M.D.   On: 02/07/2022 16:20      IMPRESSION AND PLAN:  Principal Problem:   Intractable nausea and vomiting  1.  Intractable nausea and vomiting.  Differential diagnosis would include acute gastritis, duodenitis, gastric or duodenal erosions or ulcers.  She has a prominent appendix without clear evidence of acute appendicitis but does have right lower quadrant tenderness and mild pain with vomiting. - She will be admitted to an observation medical monitored bed. - We will keep n.p.o. - She will be  placed on IV PPI therapy and resume H2 blocker therapy. - She will be hydrated with IV normal saline with potassium chloride. - Scheduled and as needed antiemetics will be provided. - General surgery consult to be obtained. - I notified Dr. Milas Gain about the patient.  2.  Hypokalemia and hypomagnesemia. - Potassium will be replaced and magnesium sulfate was given.  3.  Mild hyponatremia and hypochloremia likely hypovolemic. - She will be hydrated with IV normal saline and will follow her BMP.  4.  Dyslipidemia. - We will continue statin therapy.  5.  Bipolar 1 disorder. - We will continue Lamictal.  6.  Asthma/COPD. - We will continue her Spiriva.  7.  History of migraine. - As needed Imitrex will be continued.   DVT prophylaxis: SCDs.  We are holding off medical prophylaxis given mild thrombocytopenia and recurrent nausea and vomiting being a bleeding risk. Code Status: full code. Family Communication:  The plan of care was discussed in details with the patient (and family). I answered all questions. The patient agreed to proceed with the above mentioned plan. Further management will depend upon hospital course. Disposition Plan: Back to previous home environment Consults called: General surgery.   Left all the records are reviewed and case discussed with ED provider.  Status is: Observation  I certify that at the time of admission, it is my clinical judgment that the patient will require inpatient hospital care extending less than 2 midnights.                            Dispo: The patient is from: Home              Anticipated d/c is to: Home              Patient currently is not medically stable to d/c.              Difficult to place patient: No    Christel Mormon M.D on 02/07/2022 at 9:01 PM  [facility]   From 7 PM-7 AM, contact night-coverage [email or web address]  CC: Primary care physician; Nelle Don, MD

## 2022-02-07 NOTE — ED Notes (Addendum)
See triage note  presents with n/v  per family she was seen on Monday for same  he states she conts' to have n/v  states he Zofran doesn't work  Pt is bed bound

## 2022-02-08 DIAGNOSIS — K2100 Gastro-esophageal reflux disease with esophagitis, without bleeding: Secondary | ICD-10-CM | POA: Diagnosis present

## 2022-02-08 DIAGNOSIS — E785 Hyperlipidemia, unspecified: Secondary | ICD-10-CM | POA: Diagnosis present

## 2022-02-08 DIAGNOSIS — R29810 Facial weakness: Secondary | ICD-10-CM | POA: Diagnosis present

## 2022-02-08 DIAGNOSIS — L8990 Pressure ulcer of unspecified site, unspecified stage: Secondary | ICD-10-CM | POA: Diagnosis present

## 2022-02-08 DIAGNOSIS — R112 Nausea with vomiting, unspecified: Secondary | ICD-10-CM | POA: Diagnosis present

## 2022-02-08 DIAGNOSIS — R1010 Upper abdominal pain, unspecified: Secondary | ICD-10-CM | POA: Diagnosis not present

## 2022-02-08 DIAGNOSIS — E871 Hypo-osmolality and hyponatremia: Secondary | ICD-10-CM | POA: Diagnosis present

## 2022-02-08 DIAGNOSIS — D696 Thrombocytopenia, unspecified: Secondary | ICD-10-CM | POA: Diagnosis present

## 2022-02-08 DIAGNOSIS — J449 Chronic obstructive pulmonary disease, unspecified: Secondary | ICD-10-CM | POA: Diagnosis present

## 2022-02-08 DIAGNOSIS — J45909 Unspecified asthma, uncomplicated: Secondary | ICD-10-CM | POA: Diagnosis present

## 2022-02-08 DIAGNOSIS — Z8249 Family history of ischemic heart disease and other diseases of the circulatory system: Secondary | ICD-10-CM | POA: Diagnosis not present

## 2022-02-08 DIAGNOSIS — Z8719 Personal history of other diseases of the digestive system: Secondary | ICD-10-CM | POA: Diagnosis not present

## 2022-02-08 DIAGNOSIS — F319 Bipolar disorder, unspecified: Secondary | ICD-10-CM | POA: Diagnosis present

## 2022-02-08 DIAGNOSIS — Q396 Congenital diverticulum of esophagus: Secondary | ICD-10-CM | POA: Diagnosis not present

## 2022-02-08 DIAGNOSIS — E878 Other disorders of electrolyte and fluid balance, not elsewhere classified: Secondary | ICD-10-CM | POA: Diagnosis present

## 2022-02-08 DIAGNOSIS — K2970 Gastritis, unspecified, without bleeding: Secondary | ICD-10-CM | POA: Diagnosis present

## 2022-02-08 DIAGNOSIS — Z9981 Dependence on supplemental oxygen: Secondary | ICD-10-CM | POA: Diagnosis not present

## 2022-02-08 DIAGNOSIS — Z9049 Acquired absence of other specified parts of digestive tract: Secondary | ICD-10-CM | POA: Diagnosis not present

## 2022-02-08 DIAGNOSIS — K529 Noninfective gastroenteritis and colitis, unspecified: Secondary | ICD-10-CM | POA: Diagnosis present

## 2022-02-08 DIAGNOSIS — Z20822 Contact with and (suspected) exposure to covid-19: Secondary | ICD-10-CM | POA: Diagnosis present

## 2022-02-08 DIAGNOSIS — I10 Essential (primary) hypertension: Secondary | ICD-10-CM | POA: Diagnosis present

## 2022-02-08 DIAGNOSIS — Z8669 Personal history of other diseases of the nervous system and sense organs: Secondary | ICD-10-CM

## 2022-02-08 DIAGNOSIS — Z90710 Acquired absence of both cervix and uterus: Secondary | ICD-10-CM | POA: Diagnosis not present

## 2022-02-08 DIAGNOSIS — E876 Hypokalemia: Secondary | ICD-10-CM | POA: Diagnosis present

## 2022-02-08 DIAGNOSIS — Z6841 Body Mass Index (BMI) 40.0 and over, adult: Secondary | ICD-10-CM | POA: Diagnosis not present

## 2022-02-08 LAB — BASIC METABOLIC PANEL
Anion gap: 6 (ref 5–15)
BUN: 8 mg/dL (ref 6–20)
CO2: 32 mmol/L (ref 22–32)
Calcium: 7.7 mg/dL — ABNORMAL LOW (ref 8.9–10.3)
Chloride: 96 mmol/L — ABNORMAL LOW (ref 98–111)
Creatinine, Ser: 0.51 mg/dL (ref 0.44–1.00)
GFR, Estimated: >60 mL/min (ref >60)
Glucose, Bld: 79 mg/dL (ref 70–99)
Potassium: 2.7 mmol/L — CL (ref 3.5–5.1)
Sodium: 134 mmol/L — ABNORMAL LOW (ref 135–145)

## 2022-02-08 LAB — CBC
HCT: 28.1 % — ABNORMAL LOW (ref 36.0–46.0)
Hemoglobin: 9.1 g/dL — ABNORMAL LOW (ref 12.0–15.0)
MCH: 28.1 pg (ref 26.0–34.0)
MCHC: 32.4 g/dL (ref 30.0–36.0)
MCV: 86.7 fL (ref 80.0–100.0)
Platelets: 120 10*3/uL — ABNORMAL LOW (ref 150–400)
RBC: 3.24 MIL/uL — ABNORMAL LOW (ref 3.87–5.11)
RDW: 15.1 % (ref 11.5–15.5)
WBC: 3.8 10*3/uL — ABNORMAL LOW (ref 4.0–10.5)
nRBC: 0 % (ref 0.0–0.2)

## 2022-02-08 MED ORDER — POTASSIUM CHLORIDE 10 MEQ/100ML IV SOLN
10.0000 meq | INTRAVENOUS | Status: AC
  Administered 2022-02-08 (×4): 10 meq via INTRAVENOUS
  Filled 2022-02-08 (×3): qty 100

## 2022-02-08 MED ORDER — VITAMIN D (ERGOCALCIFEROL) 1.25 MG (50000 UNIT) PO CAPS: 50000.0000 [IU] | ORAL_CAPSULE | ORAL | Status: DC

## 2022-02-08 NOTE — Assessment & Plan Note (Signed)
Present admission.  I agree with the wound description as outlined below. Frequent repositioning of patient. Local wound care and monitor closely  Pressure Injury 02/07/22 Buttocks Left Stage 2 -  Partial thickness loss of dermis presenting as a shallow open injury with a red, pink wound bed without slough. red and flaky (Active)  02/07/22 2238  Location: Buttocks  Location Orientation: Left  Staging: Stage 2 -  Partial thickness loss of dermis presenting as a shallow open injury with a red, pink wound bed without slough.  Wound Description (Comments): red and flaky  Present on Admission: Yes     Pressure Injury Buttocks Right Stage 2 -  Partial thickness loss of dermis presenting as a shallow open injury with a red, pink wound bed without slough. pink and flaky (Active)     Location: Buttocks  Location Orientation: Right  Staging: Stage 2 -  Partial thickness loss of dermis presenting as a shallow open injury with a red, pink wound bed without slough.  Wound Description (Comments): pink and flaky  Present on Admission: Yes

## 2022-02-08 NOTE — Assessment & Plan Note (Addendum)
--  monitor and replete with IV mag

## 2022-02-08 NOTE — Assessment & Plan Note (Addendum)
Appears stable.   --Pt has been refusing Lamictal and Cymbalta.

## 2022-02-08 NOTE — TOC Initial Note (Signed)
Transition of Care [facility]) - Initial/Assessment Note    Patient Details  Name: Tiffany [name] MRN: [id number] Date of Birth: 1961-10-23  Transition of Care [facility]) CM/SW Contact:    Candie Chroman, LCSW Phone Number: 02/08/2022, 3:08 PM  Clinical Narrative:   Met with DSS social worker, Robet Leu. She has been following patient's case and wants her placed in a SNF for long-term care. Met with patient together. She is agreeable to placement. Will follow up with bed offers once available. She understands her check minus $30-$60 will go directly to the facility and that we may not be able to find placement in [facility].               Expected Discharge Plan: Skilled Nursing Facility Barriers to Discharge: Continued Medical Work up, SNF Pending bed offer   Patient Goals and CMS Choice        Expected Discharge Plan and Services Expected Discharge Plan: [place] Choice: [place] arrangements for the past 2 months: Hotel/Motel                                      Prior Living Arrangements/Services Living arrangements for the past 2 months: Hotel/Motel Lives with:: Significant Other Patient language and need for interpreter reviewed:: Yes Do you feel safe going back to the place where you live?: Yes      Need for Family Participation in Patient Care: Yes (Comment)     Criminal Activity/Legal Involvement Pertinent to Current Situation/Hospitalization: No - Comment as needed  Activities of Daily Living Home Assistive Devices/Equipment: Wheelchair ADL Screening (condition at time of admission) Patient's cognitive ability adequate to safely complete daily activities?: Yes Is the patient deaf or have difficulty hearing?: No Does the patient have difficulty seeing, even when wearing glasses/contacts?: No Does the patient have difficulty concentrating, remembering, or making decisions?: No Patient able to  express need for assistance with ADLs?: Yes Does the patient have difficulty dressing or bathing?: Yes Independently performs ADLs?: No Communication: Independent Dressing (OT): Dependent Is this a change from baseline?: Pre-admission baseline Grooming: Dependent Is this a change from baseline?: Pre-admission baseline Feeding: Dependent Is this a change from baseline?: Pre-admission baseline Bathing: Dependent Is this a change from baseline?: Pre-admission baseline Toileting: Dependent Is this a change from baseline?: Pre-admission baseline In/Out Bed: Dependent Is this a change from baseline?: Pre-admission baseline Walks in Home: Dependent Is this a change from baseline?: Pre-admission baseline Does the patient have difficulty walking or climbing stairs?: Yes Weakness of Legs: Both Weakness of Arms/Hands: Both  Permission Sought/Granted Permission sought to share information with : Facility Art therapist granted to share information with : Yes, Verbal Permission Granted  Share Information with NAME: Robet Leu  Permission granted to share info w AGENCY: SNF's  Permission granted to share info w Relationship: DSS Social Worker  Permission granted to share info w Contact Information: [phone]  Emotional Assessment Appearance:: Appears stated age Attitude/Demeanor/Rapport: Engaged Affect (typically observed): Accepting, Flat Orientation: : Oriented to Self, Oriented to Place, Oriented to  Time, Oriented to Situation Alcohol / Substance Use: Not Applicable Psych Involvement: No (comment)  Admission diagnosis:  Hypokalemia [E87.6] Hypomagnesemia [E83.42] Intractable nausea and vomiting [R11.2] Patient Active Problem List   Diagnosis Date Noted   Pressure injury of skin 02/08/2022   Hypomagnesemia 02/08/2022  Hyponatremia 02/08/2022   Bipolar 1 disorder ([place]) 02/08/2022   Asthma, chronic 02/08/2022   History of migraine 02/08/2022   Dyslipidemia  02/08/2022   Intractable nausea and vomiting 02/07/2022   Hypoxia    Acute respiratory failure with hypoxia and hypercarbia [facility])    Ambulatory dysfunction    Primary hypertension    Encephalopathy 11/13/2021   Hypokalemia 10/29/2021   Frequent falls 10/29/2021   Refractory nausea and vomiting 10/29/2021   Thrombocytopenia ([place]) 10/29/2021   GERD (gastroesophageal reflux disease) 10/29/2021   PCP:  Nelle Don, MD Pharmacy:   [facility] [address] [place] [address] [place] [address] Phone: [phone] Fax: [place] [address]), [place]) [place] [address] Phone: [phone] Fax: [phone]     Social Determinants of Health ([place]) Interventions    Readmission Risk Interventions Readmission Risk Prevention Plan 11/08/2021  Transportation Screening Complete  PCP or Specialist Appt within 5-7 Days Complete  Home Care Screening Complete  Medication Review (RN CM) Complete  Some recent data might be hidden

## 2022-02-08 NOTE — Consult Note (Signed)
[facility] SURGICAL CONSULTATION NOTE (initial) - [id number]   HISTORY OF PRESENT ILLNESS (HPI):  61 y.o. female presented to [facility] ED overnight for evaluation of abdominal pain, nausea, emesis. Patient and her husband at bedside report that for the last month or two, she has been having upper abdominal discomfort, nausea, emesis, and inability to tolerate PO. She was seen on 02/06 in the ED for this as well but ultimately went home. Unfortunately, symptoms did not improve at home. She had not had any RLQ discomfort. No fever, chills, cough, SOB, CP, diarrhea, or urinary changes. Unsure of any food/water exposures prior to the onset of symptoms. She has had previous intra-abdominal surgeries including C-section, hysterectomy, and "reflux surgery." Work up in the ED revealed a normal WBC at 6.6K, renal function normal with sCr - 0.61, hypokalemia to 2.7, hypomagnesemia to 1.4. She did undergo CT Abdomen/Pelvis which did not show any obvious intra-abdominal pathology but did show a mildly dilated appendix without any surrounding inflammatory changes. She was admitted to medicine for intractable nausea/emesis and electrolyte replacement.   Surgery is consulted by hospitalist physician Dr. Eugenie Norrie, MD in this context for evaluation of incidental finding of mildly dilated appendix without inflammatory changes.  PAST MEDICAL HISTORY (PMH):  Past Medical History:  Diagnosis Date   Asthma    Bipolar 1 disorder (HCC)    COPD (chronic obstructive pulmonary disease) (HCC)    Enlarged heart    GERD (gastroesophageal reflux disease)    Migraines      PAST SURGICAL HISTORY ([place]):  Past Surgical History:  Procedure Laterality Date   ABDOMINAL HYSTERECTOMY     ABDOMINAL SURGERY     "For acid reflux"   CESAREAN SECTION       MEDICATIONS:  Prior to Admission medications   Medication Sig Start Date End Date Taking? Authorizing Provider  acetaminophen (TYLENOL) 325 MG tablet Take 2  tablets (650 mg total) by mouth every 6 (six) hours as needed for mild pain (or Fever >/= 101). 11/08/21   Nolberto Hanlon, MD  atorvastatin (LIPITOR) 80 MG tablet Take 80 mg by mouth daily.    [provider]  diclofenac Sodium (VOLTAREN) 1 % GEL Apply 4 g topically 4 (four) times daily. 11/08/21   Nolberto Hanlon, MD  DULoxetine (CYMBALTA) 30 MG capsule Take 30 mg by mouth daily. 09/05/21 09/05/22  [provider]  famotidine (PEPCID) 20 MG tablet Take 1 tablet (20 mg total) by mouth 2 (two) times daily. 02/05/22   Carrie Mew, MD  FLOVENT HFA 110 MCG/ACT inhaler Inhale 2 puffs into the lungs 2 (two) times daily. 10/20/21   [provider]  folic acid (FOLVITE) 1 MG tablet Take 5 tablets (5 mg total) by mouth daily. 11/09/21   Nolberto Hanlon, MD  lamoTRIgine (LAMICTAL) 25 MG tablet Take 50 mg by mouth daily. 12/13/21   [provider]  loratadine (CLARITIN) 10 MG tablet Take 1 tablet (10 mg total) by mouth every evening. 11/08/21   Nolberto Hanlon, MD  metoCLOPramide (REGLAN) 10 MG tablet Take 1 tablet (10 mg total) by mouth 3 (three) times daily before meals. 11/08/21   Nolberto Hanlon, MD  Multiple Vitamin (MULTIVITAMIN WITH MINERALS) TABS tablet Take 1 tablet by mouth daily. 11/09/21   Nolberto Hanlon, MD  Nutritional Supplements (FEEDING SUPPLEMENT, KATE FARMS STANDARD 1.4,) LIQD liquid Take 325 mLs by mouth 3 (three) times daily between meals. 11/08/21   Nolberto Hanlon, MD  ondansetron (ZOFRAN-ODT) 4 MG disintegrating tablet Take  1 tablet (4 mg total) by mouth every 8 (eight) hours as needed for nausea or vomiting. 02/05/22   Carrie Mew, MD  pantoprazole (PROTONIX) 40 MG tablet Take 40 mg by mouth 2 (two) times daily. 08/17/21   [provider]  polyethylene glycol (MIRALAX / GLYCOLAX) 17 g packet Take 17 g by mouth 2 (two) times daily. 11/08/21   Nolberto Hanlon, MD  SPIRIVA HANDIHALER 18 MCG inhalation capsule 1 capsule at bedtime. 08/02/21   [provider]   SUMAtriptan (IMITREX) 100 MG tablet Take 100 mg by mouth daily as needed. 08/02/21   [provider]  traZODone (DESYREL) 100 MG tablet Take 1 tablet (100 mg total) by mouth at bedtime as needed for sleep. 11/08/21   Nolberto Hanlon, MD  Vitamin D, Ergocalciferol, (DRISDOL) 1.25 MG (50000 UNIT) CAPS capsule Take 1 capsule (50,000 Units total) by mouth every 7 (seven) days. 11/13/21   Nolberto Hanlon, MD     ALLERGIES:  Allergies  Allergen Reactions   Dairy Aid [Tilactase] Other (See Comments)    Exacerbates asthma   Egg [Eggs Or Egg-Derived Products] Other (See Comments)    Exacerbated asthma   Milk-Related Compounds      SOCIAL HISTORY:  Social History   Socioeconomic History   Marital status: Single    Spouse name: Not on file   Number of children: Not on file   Years of education: Not on file   Highest education level: Not on file  Occupational History   Not on file  Tobacco Use   Smoking status: Never   Smokeless tobacco: Never  Substance and Sexual Activity   Alcohol use: Never   Drug use: Never   Sexual activity: Not on file  Other Topics Concern   Not on file  Social History Narrative   Not on file   Social Determinants of Health   Financial Resource Strain: Not on file  Food Insecurity: Not on file  Transportation Needs: Not on file  Physical Activity: Not on file  Stress: Not on file  Social Connections: Not on file  Intimate Partner Violence: Not on file     FAMILY HISTORY:  Family History  Problem Relation Age of Onset   Hypertension Mother       REVIEW OF SYSTEMS:  Review of Systems  Constitutional:  Positive for malaise/fatigue. Negative for chills and fever.  HENT:  Negative for congestion and sore throat.   Respiratory:  Negative for cough and shortness of breath.   Cardiovascular:  Negative for chest pain and palpitations.  Gastrointestinal:  Positive for abdominal pain, nausea and vomiting. Negative for constipation and diarrhea.   Genitourinary:  Negative for dysuria and urgency.  All other systems reviewed and are negative.  VITAL SIGNS:  Temp:  [98 F (36.7 C)-98.8 F (37.1 C)] 98.6 F (37 C) (02/09 0404) Pulse Rate:  [78-88] 78 (02/09 0404) Resp:  [16-18] 18 (02/09 0404) BP: (117-138)/(70-91) 121/70 (02/09 0404) SpO2:  [98 %-100 %] 100 % (02/09 0404) Weight:  [131.1 kg] 131.1 kg (02/08 1213)     Height: 5\' 9"  (175.3 cm) Weight: 131.1 kg BMI (Calculated): 42.66   INTAKE/OUTPUT:  No intake/output data recorded.  PHYSICAL EXAM:  Physical Exam Vitals and nursing note reviewed.  Constitutional:      General: She is not in acute distress.    Appearance: Normal appearance. She is obese. She is not ill-appearing.     Interventions: Nasal cannula in place.  HENT:  Head: Normocephalic and atraumatic.  Eyes:     General: No scleral icterus.    Conjunctiva/sclera: Conjunctivae normal.  Cardiovascular:     Rate and Rhythm: Normal rate and regular rhythm.     Pulses: Normal pulses.     Heart sounds: No murmur heard. Pulmonary:     Effort: Pulmonary effort is normal.  Abdominal:     General: A surgical scar is present. There is no distension.     Palpations: Abdomen is soft.     Tenderness: There is abdominal tenderness in the epigastric area. There is no guarding or rebound. Negative signs include Rovsing's sign and McBurney's sign.     Comments: Abdomen is soft, she appears most uncomfortable in the upper abdomen with deep palpation, non-tender in the RLQ, non-distended, no rebound/guarding. She is certainly not peritonitic. Previous surgical scars appreciated   Genitourinary:    Comments: Deferred Musculoskeletal:     Right lower leg: No edema.     Left lower leg: No edema.  Skin:    General: Skin is warm and dry.     Coloration: Skin is not pale.     Findings: No erythema.  Neurological:     General: No focal deficit present.     Mental Status: She is alert and oriented to person, place, and  time.  Psychiatric:        Mood and Affect: Mood normal.        Behavior: Behavior normal.     Labs:  CBC Latest Ref Rng & Units 02/08/2022 02/07/2022 02/05/2022  WBC 4.0 - 10.5 K/uL 3.8(L) 6.6 7.1  Hemoglobin 12.0 - 15.0 g/dL 9.1(L) 9.9(L) 11.0(L)  Hematocrit 36.0 - 46.0 % 28.1(L) 31.0(L) 35.0(L)  Platelets 150 - 400 K/uL 120(L) 140(L) 248   CMP Latest Ref Rng & Units 02/08/2022 02/07/2022 02/05/2022  Glucose 70 - 99 mg/dL 79 96 85  BUN 6 - 20 mg/dL 8 9 9   Creatinine 0.44 - 1.00 mg/dL 0.51 0.61 0.54  Sodium 135 - 145 mmol/L 134(L) 134(L) 136  Potassium 3.5 - 5.1 mmol/L 2.7(LL) 2.7(LL) 2.8(L)  Chloride 98 - 111 mmol/L 96(L) 94(L) 93(L)  CO2 22 - 32 mmol/L 32 31 31  Calcium 8.9 - 10.3 mg/dL 7.7(L) 7.9(L) 8.3(L)  Total Protein 6.5 - 8.1 g/dL - 5.4(L) 6.4(L)  Total Bilirubin 0.3 - 1.2 mg/dL - 0.9 1.1  Alkaline Phos 38 - 126 U/L - 44 52  AST 15 - 41 U/L - 19 16  ALT 0 - 44 U/L - 11 12     Imaging studies:   CT Abdomen/Pelvis (02/07/2022) personally reviewed which does show a mildly prominent appendix, no appendicolith or mucocele, no surrounding inflammation, certainly no abscess or free air, and radiologist report reviewed below:  IMPRESSION: 1. No definite acute findings. Apparent prominence of the appendix without surrounding inflammatory changes to suggest acute appendicitis in this patient without leukocytosis. Recommend clinical follow up. 2. Moderate size hiatal hernia.  No evidence of bowel obstruction. 3. Previous hysterectomy.   Assessment/Plan: (ICD-10's: R11.2) 61 y.o. female with generalized upper abdominal discomfort with intractable nausea, emesis suspect likely gastroenteritis found incidentally to have mildly dilated appendix on CT without any inflammatory changes, RLQ abdominal pain, nor leukocytosis.   - Suspect her clinical picture fits more closely with possible gastroenteritis. I have a very low suspicion for appendicitis as she is non-tender in the RLQ, she  remains without leukocytosis, and CT does not have any appendiceal changes to suggest appendicitis. Appendix dilation seen  is likely non-specific and incidental.   - Encouraged IVF resuscitation - Replete K+;Monitor  - She certainly does not need any surgical intervention   - Monitor abdominal examination   - Further management per primary service; we will be available. If her clinical picture/examination changes, please do not hesitate to reach out. We can follow peripherally.   All of the above findings and recommendations were discussed with the patient and her family at bedside, and all of their questions were answered to their expressed satisfaction.  Thank you for the opportunity to participate in this patient's care.   -- Edison Simon, PA-C [facility] 02/08/2022, 8:22 AM [phone] M-F: 7am - 4pm

## 2022-02-08 NOTE — Assessment & Plan Note (Addendum)
--  monitor and replete PRN ?

## 2022-02-08 NOTE — Progress Notes (Addendum)
Progress Note   Patient: Tiffany [name] TSV:[id number] DOB: [birth year] DOA: 02/07/2022     0 DOS: the patient was seen and examined on 02/08/2022   Brief hospital course: Tiffany [name] is a 61 y.o. female with medical history significant for asthma, COPD, bipolar 1 disorder, GERD and migraine, who presented to the ER on 02/07/2022 with intractable nausea and vomiting for the last 8 days.  She denied having any diarrhea but reported lower abdominal discomfort more on the right than the left.  Pt had been seen in the ER couple of days prior for her nausea and vomiting were managed with antiemetics and hydration and therefore was thought to be stable for discharge given tolerating PO intake at that time.  In the ED this time, labs showed K 2.7, Mg 1.4.   CT abdomen/pelvis was non-acute but showed a dilated appendix without signs of appendicitis.  Admitted to medicine service with general surgery consulted to review appendix findings on imaging.    Patient on supportive care for likely gastroenteritis and electrolytes are being corrected.   Assessment and Plan: * Intractable nausea and vomiting- (present on admission) Present on admission, persistent for over a week per patient.  No abdominal pain other than abdominal wall muscles secondary to strain from vomiting. -- IV fluids for hydration -- Antiemetics as needed --monitor and replace electrolytes  Hyponatremia- (present on admission) Present on admission, mild with sodium 134.  Likely hypovolemic in the setting of nausea vomiting.  On IV fluids.  Monitored BMP  Hypomagnesemia- (present on admission) Present on admission with Mg 1.4. Replacement underway.  Monitor and replace magnesium as needed  Hypokalemia- (present on admission) Present on admission.  K is being replaced with IV fluid additive.  Monitor and replace K as needed. 2/9: K still 2.7, further replacement underway  Pressure injury of skin- (present on  admission) Present admission.  I agree with the wound description as outlined below. Frequent repositioning of patient. Local wound care and monitor closely  Pressure Injury 02/07/22 Buttocks Left Stage 2 -  Partial thickness loss of dermis presenting as a shallow open injury with a red, pink wound bed without slough. red and flaky (Active)  02/07/22 2238  Location: Buttocks  Location Orientation: Left  Staging: Stage 2 -  Partial thickness loss of dermis presenting as a shallow open injury with a red, pink wound bed without slough.  Wound Description (Comments): red and flaky  Present on Admission: Yes     Pressure Injury Buttocks Right Stage 2 -  Partial thickness loss of dermis presenting as a shallow open injury with a red, pink wound bed without slough. pink and flaky (Active)     Location: Buttocks  Location Orientation: Right  Staging: Stage 2 -  Partial thickness loss of dermis presenting as a shallow open injury with a red, pink wound bed without slough.  Wound Description (Comments): pink and flaky  Present on Admission: Yes        Dyslipidemia- (present on admission) Continue statin  History of migraine Continue as needed Imitrex  Asthma, chronic- (present on admission) Asthma/COPD is stable without exacerbation. -- Continue Spiriva -- Pulmicort nebs  Bipolar 1 disorder (HCC)- (present on admission) Appears stable.   Continue Lamictal and Cymbalta  GERD (gastroesophageal reflux disease)- (present on admission) Continue PPI (IV for now with nausea vomiting ongoing)  Thrombocytopenia (HCC)- (present on admission) Platelets on admission 140.  Today down slightly to 120.  Suspect at due to infection. SCDs  for VTE prophylaxis. Monitor CBC. Avoid heparin products.        Subjective: Patient awake sitting up in bed when seen today.  She reports ongoing nausea and vomiting.  She denies any abdominal pain but says her abs are sore from vomiting and from  coughing.  Requesting some ice chips.  Denies fevers or chills.  No other acute complaints at this time.  Physical Exam: Vitals:   02/08/22 0404 02/08/22 0827 02/08/22 0832 02/08/22 0855  BP: 121/70 121/72  124/76  Pulse: 78 76  73  Resp: 18 16  19   Temp: 98.6 F (37 C) 97.8 F (36.6 C)  98.7 F (37.1 C)  TempSrc: Oral Oral  Oral  SpO2: 100% 100% 100% 100%  Weight:      Height:       General exam: awake, alert, no acute distress, obese HEENT: atraumatic, clear conjunctiva, anicteric sclera, moist mucus membranes, hearing grossly normal  Respiratory system: CTAB with diminished bases due to body habitus, no wheezes, rales or rhonchi, normal respiratory effort. Cardiovascular system: normal S1/S2, RRR, no pedal edema.   Gastrointestinal system: soft, NT, ND, no HSM felt, +bowel sounds. Central nervous system: A&O x3. no gross focal neurologic deficits, normal speech Extremities: moves all, no edema, normal tone Skin: dry, intact, normal temperature Psychiatry: normal mood, flat affect   Data Reviewed:  Labs reviewed and notable for sodium 134, potassium 2.7 unchanged, chloride 96, magnesium not checked today but overnight was still 1.4 unchanged, hemoglobin 9.1 down from 9.9 possibly delusional, platelets down from 140>> 120  Family Communication: None at bedside on rounds, will attempt to call as time allows this afternoon  Disposition: Status is: Inpatient Remains inpatient appropriate because: Severity of illness with ongoing nausea vomiting requiring IV hydration and electrolyte replacements close monitoring       Planned Discharge Destination: Home     Time spent: 35 minutes  Author: , DO 02/08/2022 3:06 PM  For on call review [email or web address].

## 2022-02-08 NOTE — Assessment & Plan Note (Addendum)
Hold statin until tolerating PO intake better

## 2022-02-08 NOTE — Assessment & Plan Note (Signed)
Continue as needed Imitrex

## 2022-02-08 NOTE — NC FL2 (Signed)
[place] MEDICAID FL2 LEVEL OF CARE SCREENING TOOL     IDENTIFICATION  Patient Name: Tiffany [name] Birthdate: 08-19-1961 Sex: female Admission Date (Current Location): 02/07/2022  [place] and [place] Number:  Chiropodist and Address:  [facility], [address] [place], [place] [address]      Provider Number: [id number]  Attending Physician Name and Address:  Pennie Banter, DO  Relative Name and Phone Number:       Current Level of Care: Hospital Recommended Level of Care: Skilled Nursing Facility Prior Approval Number:    Date Approved/Denied:   PASRR Number: [id number]  Discharge Plan: SNF    Current Diagnoses: Patient Active Problem List   Diagnosis Date Noted   Pressure injury of skin 02/08/2022   Hypomagnesemia 02/08/2022   Hyponatremia 02/08/2022   Bipolar 1 disorder (HCC) 02/08/2022   Asthma, chronic 02/08/2022   History of migraine 02/08/2022   Dyslipidemia 02/08/2022   Intractable nausea and vomiting 02/07/2022   Hypoxia    Acute respiratory failure with hypoxia and hypercarbia (HCC)    Ambulatory dysfunction    Primary hypertension    Encephalopathy 11/13/2021   Hypokalemia 10/29/2021   Frequent falls 10/29/2021   Refractory nausea and vomiting 10/29/2021   Thrombocytopenia (HCC) 10/29/2021   GERD (gastroesophageal reflux disease) 10/29/2021    Orientation RESPIRATION BLADDER Height & Weight     Self, Time, Situation, Place  O2 (Nasal Cannula 2 L) Incontinent, External catheter Weight: 289 lb 0.4 oz (131.1 kg) Height:  5\' 9"  (175.3 cm)  BEHAVIORAL SYMPTOMS/MOOD NEUROLOGICAL BOWEL NUTRITION STATUS   (None)  (None) Continent Diet (Follow for discharge recommendations. Currently NPO.)  AMBULATORY STATUS COMMUNICATION OF NEEDS Skin   Extensive Assist Verbally Other (Comment), PU Stage and Appropriate Care (Blister.)   PU Stage 2 Dressing:  (Right and left buttocks: Foam.)                    Personal Care Assistance Level of Assistance              Functional Limitations Info  Sight, Hearing, Speech Sight Info: Adequate Hearing Info: Adequate Speech Info: Adequate    SPECIAL CARE FACTORS FREQUENCY                       Contractures Contractures Info: Not present    Additional Factors Info  Code Status, Allergies, Psychotropic Code Status Info: Full code Allergies Info: Dairy aid (tilactase), Eggs (or egg-derived products), Milk related compounds. Psychotropic Info: Bipolar I         Current Medications (02/08/2022):  This is the current hospital active medication list Current Facility-Administered Medications  Medication Dose Route Frequency Provider Last Rate Last Admin   0.9 % NaCl with KCl 20 mEq/ L  infusion   Intravenous Continuous Mansy, Jan A, MD 100 mL/hr at 02/08/22 0907 New Bag at 02/08/22 0907   acetaminophen (TYLENOL) tablet 650 mg  650 mg Oral Q6H PRN Mansy, Jan A, MD       Or   acetaminophen (TYLENOL) suppository 650 mg  650 mg Rectal Q6H PRN Mansy, Jan A, MD       atorvastatin (LIPITOR) tablet 80 mg  80 mg Oral Daily Mansy, Jan A, MD   80 mg at 02/08/22 0942   budesonide (PULMICORT) nebulizer solution 0.25 mg  0.25 mg Nebulization BID Mansy, Jan A, MD   0.25 mg at 02/08/22 04/08/22   diclofenac Sodium (VOLTAREN) 1 %  topical gel 4 g  4 g Topical QID Mansy, Jan A, MD       DULoxetine (CYMBALTA) DR capsule 30 mg  30 mg Oral Daily Mansy, Jan A, MD   30 mg at 02/08/22 0942   famotidine (PEPCID) tablet 20 mg  20 mg Oral BID Mansy, Jan A, MD   20 mg at 02/08/22 0942   feeding supplement (KATE FARMS STANDARD 1.4) liquid 325 mL  325 mL Oral TID BM Mansy, Jan A, MD       folic acid (FOLVITE) tablet 5 mg  5 mg Oral Daily Mansy, Jan A, MD   5 mg at 02/08/22 0941   lamoTRIgine (LAMICTAL) tablet 50 mg  50 mg Oral Daily Mansy, Jan A, MD   50 mg at 02/08/22 0942   loratadine (CLARITIN) tablet 10 mg  10 mg Oral QPM Mansy, Jan A, MD   10 mg at 02/07/22 2215    magnesium hydroxide (MILK OF MAGNESIA) suspension 30 mL  30 mL Oral Daily PRN Mansy, Jan A, MD       metoCLOPramide (REGLAN) injection 10 mg  10 mg Intravenous Q6H Mansy, Jan A, MD   10 mg at 02/08/22 1200   multivitamin with minerals tablet 1 tablet  1 tablet Oral Daily Mansy, Jan A, MD       ondansetron [facility]) tablet 4 mg  4 mg Oral Q6H PRN Mansy, Jan A, MD       Or   ondansetron [facility]) injection 4 mg  4 mg Intravenous Q6H PRN Mansy, Jan A, MD       pantoprazole (PROTONIX) injection 40 mg  40 mg Intravenous Q12H Mansy, Jan A, MD   40 mg at 02/08/22 0954   polyethylene glycol (MIRALAX / GLYCOLAX) packet 17 g  17 g Oral BID Mansy, Jan A, MD       SUMAtriptan (IMITREX) tablet 100 mg  100 mg Oral Daily PRN Mansy, Jan A, MD       tiotropium [facility]) inhalation capsule [facility] use ONLY) 18 mcg  1 capsule Inhalation Daily Mansy, Jan A, MD   18 mcg at 02/08/22 0945   traZODone (DESYREL) tablet 100 mg  100 mg Oral QHS PRN Mansy, Vernetta Honey, MD       [START ON 02/13/2022] Vitamin D (Ergocalciferol) (DRISDOL) capsule 50,000 Units  50,000 Units Oral Q7 days Sharen Hones, [facility] -          Discharge Medications: Please see discharge summary for a list of discharge medications.  Relevant Imaging Results:  Relevant Lab Results:   Additional Information SS#: [id number]. Looking for long-term placement. DSS following case.  Margarito Liner, LCSW

## 2022-02-08 NOTE — Assessment & Plan Note (Signed)
Asthma/COPD is stable without exacerbation. -- Continue Spiriva -- Pulmicort nebs

## 2022-02-08 NOTE — Assessment & Plan Note (Addendum)
Present on admission, persistent for over a week per patient.  No abdominal pain other than abdominal wall muscles secondary to strain from vomiting. 2/14: N/V still persistent, EGD didn't show reason for N/V --Reglan stopped, was unhelpful and pt developed diarrhea, very low suspicion for motility issue Plan: --Sheduled Zofran TID before meals, although pt has been refusing them --IV compazine PRN, pt prefers

## 2022-02-08 NOTE — Assessment & Plan Note (Addendum)
Of unclear significance, normal now.

## 2022-02-08 NOTE — Assessment & Plan Note (Addendum)
--  oral PPI BID

## 2022-02-08 NOTE — Assessment & Plan Note (Addendum)
Resolved with IV hydration.   Present on admission, mild with sodium 134.  Likely hypovolemic in the setting of nausea vomiting.   --d/c MIVF today

## 2022-02-08 NOTE — Hospital Course (Signed)
Tiffany [name] is a 61 y.o. female with medical history significant for asthma, COPD, bipolar 1 disorder, GERD and migraine, who presented to the ER on 02/07/2022 with intractable nausea and vomiting for the last 8 days.  She denied having any diarrhea but reported lower abdominal discomfort more on the right than the left.  Pt had been seen in the ER couple of days prior for her nausea and vomiting were managed with antiemetics and hydration and therefore was thought to be stable for discharge given tolerating PO intake at that time.  In the ED this time, labs showed K 2.7, Mg 1.4.   CT abdomen/pelvis was non-acute but showed a dilated appendix without signs of appendicitis.  Admitted to medicine service with general surgery consulted to review appendix findings on imaging.    Patient on supportive care for likely gastroenteritis and electrolytes are being corrected.

## 2022-02-09 LAB — BASIC METABOLIC PANEL
Anion gap: 9 (ref 5–15)
BUN: 7 mg/dL (ref 6–20)
CO2: 28 mmol/L (ref 22–32)
Calcium: 7.7 mg/dL — ABNORMAL LOW (ref 8.9–10.3)
Chloride: 98 mmol/L (ref 98–111)
Creatinine, Ser: 0.39 mg/dL — ABNORMAL LOW (ref 0.44–1.00)
GFR, Estimated: >60 mL/min (ref >60)
Glucose, Bld: 69 mg/dL — ABNORMAL LOW (ref 70–99)
Potassium: 3.2 mmol/L — ABNORMAL LOW (ref 3.5–5.1)
Sodium: 135 mmol/L (ref 135–145)

## 2022-02-09 LAB — CBC
HCT: 27.8 % — ABNORMAL LOW (ref 36.0–46.0)
Hemoglobin: 9.1 g/dL — ABNORMAL LOW (ref 12.0–15.0)
MCH: 28.1 pg (ref 26.0–34.0)
MCHC: 32.7 g/dL (ref 30.0–36.0)
MCV: 85.8 fL (ref 80.0–100.0)
Platelets: 126 10*3/uL — ABNORMAL LOW (ref 150–400)
RBC: 3.24 MIL/uL — ABNORMAL LOW (ref 3.87–5.11)
RDW: 15.2 % (ref 11.5–15.5)
WBC: 3.7 10*3/uL — ABNORMAL LOW (ref 4.0–10.5)
nRBC: 0 % (ref 0.0–0.2)

## 2022-02-09 LAB — MAGNESIUM: Magnesium: 1.7 mg/dL (ref 1.7–2.4)

## 2022-02-09 MED ORDER — POTASSIUM CHLORIDE 10 MEQ/100ML IV SOLN: 10.0000 meq | INTRAVENOUS | Status: DC

## 2022-02-09 MED ORDER — PROCHLORPERAZINE EDISYLATE 10 MG/2ML IJ SOLN
10.0000 mg | Freq: Four times a day (QID) | INTRAMUSCULAR | Status: DC | PRN
  Administered 2022-02-09 – 2022-02-17 (×9): 10 mg via INTRAVENOUS
  Filled 2022-02-09 (×9): qty 2

## 2022-02-09 MED ORDER — KCL-LACTATED RINGERS-D5W 20 MEQ/L IV SOLN
INTRAVENOUS | Status: DC
  Filled 2022-02-09 (×14): qty 1000

## 2022-02-09 MED ORDER — POTASSIUM CHLORIDE CRYS ER 20 MEQ PO TBCR
30.0000 meq | EXTENDED_RELEASE_TABLET | Freq: Once | ORAL | Status: AC
  Administered 2022-02-09: 30 meq via ORAL
  Filled 2022-02-09: qty 1

## 2022-02-09 MED ORDER — FAMOTIDINE IN NACL 20-0.9 MG/50ML-% IV SOLN
20.0000 mg | Freq: Two times a day (BID) | INTRAVENOUS | Status: DC
  Administered 2022-02-09 – 2022-02-12 (×7): 20 mg via INTRAVENOUS
  Filled 2022-02-09 (×8): qty 50

## 2022-02-09 NOTE — Evaluation (Signed)
Physical Therapy Evaluation Patient Details Name: Tiffany [name] MRN: [id number] DOB: 1961/05/08 Today's Date: 02/09/2022  History of Present Illness  61 y.o. female with medical history significant for asthma, COPD, bipolar 1 disorder, GERD and migraine, who presented to the ER on 02/07/2022 with intractable nausea and vomiting for the last 8 days.   Clinical Impression  Pt supine in bed, pillows under R side for pressure relief, supportive spouse Eddie at bedside. Pt lives with her spouse in a motel room. She is bed level at baseline and requires MAX A for all mobility. She does not sit up to EOB since multiple falls a couple months ago due to FOF. Spouse stays with pt 24/7 and is willing to provide care.   Pt performed rolling in bilateral directions this session. MAX A for rolling as she initiated with reach however required assist at hips and trunk for roll. Spouse and pt demonstrated LE exercises that they perform at home. Communication with pt was minimal; she verbalized only the word "okay". She responded to her spouses voice/instruction better than to PT. Upon hospital d/c, would recommend pt d/c to SNF for increased therapy to address strength, functional mobility and confidence to sit up to EOB in order to decrease level of assist and caregiver burden at home. If pt/spouse decide to decline SNF, pt and caregiver would benefit from hospital bed for improved pt participation and caregiver body mechanics.      Recommendations for follow up therapy are one component of a multi-disciplinary discharge planning process, led by the attending physician.  Recommendations may be updated based on patient status, additional functional criteria and insurance authorization.  Follow Up Recommendations Skilled nursing-short term rehab (<3 hours/day)    Assistance Recommended at Discharge Intermittent Supervision/Assistance  Patient can return home with the following  Two people to help with walking  and/or transfers;Two people to help with bathing/dressing/bathroom;Assistance with cooking/housework;Direct supervision/assist for medications management;Assist for transportation;Help with stairs or ramp for entrance;Direct supervision/assist for financial management    Equipment Recommendations None recommended by PT (If pt and spouse decline SNF, would benefit from hospital bed (unable to have hospital bed in motel - spouse is working on other living arrangements))  Recommendations for Other Services       Functional Status Assessment Patient has had a recent decline in their functional status and demonstrates the ability to make significant improvements in function in a reasonable and predictable amount of time.     Precautions / Restrictions Precautions Precautions: Fall Restrictions Weight Bearing Restrictions: No      Mobility  Bed Mobility Overal bed mobility: Needs Assistance Bed Mobility: Rolling Rolling: Max assist         General bed mobility comments: Pt initiates with reaching.    Transfers                   General transfer comment: not attempted - has not performed since 11/22    Ambulation/Gait                  Stairs            Wheelchair Mobility    Modified Rankin (Stroke Patients Only)       Balance                                             Pertinent Vitals/Pain  Pain Assessment Pain Assessment: No/denies pain    Home Living Family/patient expects to be discharged to:: Private residence (pt/spouse report livnig in a hotel) Living Arrangements: Spouse/significant other Available Help at Discharge: Family;Available 24 hours/day Type of Home: Other(Comment) (motel) Home Access: Level entry       Home Layout: One level Home Equipment: Tub bench Additional Comments: Lives with her sig other (Eddie) in a first floor motel room    Prior Function Prior Level of Function : Needs assist;History of  Falls (last six months)       Physical Assist : ADLs (physical);Mobility (physical) Mobility (physical): Bed mobility;Transfers;Gait ADLs (physical): Grooming;Bathing;Dressing;Toileting;IADLs Mobility Comments: significant falls hisory (at least 4), spouse reports pt has been bed bound since admission on Nov 22. Pt now has fear of falling. ADLs Comments: spouse reports heavy assist for all ADL at bed level since recent admission in Nov 22.     Hand Dominance   Dominant Hand: Right    Extremity/Trunk Assessment   Upper Extremity Assessment Upper Extremity Assessment: Generalized weakness    Lower Extremity Assessment Lower Extremity Assessment: Generalized weakness (<3/5 knee extension, hip flexion, ankle DF/PF bilaterally)       Communication   Communication: No difficulties  Cognition Arousal/Alertness: Awake/alert Behavior During Therapy: Flat affect Overall Cognitive Status: Within Functional Limits for tasks assessed                                 General Comments: Pt is very quiet during session; SO Eddie does most of the talking (allowing pt time to respond first). Pt verbalized 1 word "okay" during session.        General Comments      Exercises General Exercises - Lower Extremity Ankle Circles/Pumps: AROM, Both, 10 reps, Supine Short Arc Quad: AROM, Both, 10 reps, Supine Hip ABduction/ADduction: AROM, Both, 10 reps, Supine Straight Leg Raises: AROM, Both, 10 reps, Supine Other Exercises Other Exercises: Spouse and pt demonstrate exercises that pt has been doing at home. 10 reps each. At end of session, pillows were placed under R side for pressure relief of pressure injuries.   Assessment/Plan    PT Assessment Patient needs continued PT services  PT Problem List Decreased strength;Decreased mobility;Decreased range of motion;Decreased activity tolerance;Decreased balance       PT Treatment Interventions Therapeutic activities;Therapeutic  exercise;Balance training;Functional mobility training;Patient/family education    PT Goals (Current goals can be found in the Care Plan section)  Acute Rehab PT Goals Patient Stated Goal: To be together at d/c PT Goal Formulation: With patient/family Time For Goal Achievement: 02/23/22 Potential to Achieve Goals: Fair    Frequency Min 2X/week     Co-evaluation               AM-PAC PT "6 Clicks" Mobility  Outcome Measure Help needed turning from your back to your side while in a flat bed without using bedrails?: A Lot Help needed moving from lying on your back to sitting on the side of a flat bed without using bedrails?: Total Help needed moving to and from a bed to a chair (including a wheelchair)?: Total Help needed standing up from a chair using your arms (e.g., wheelchair or bedside chair)?: Total Help needed to walk in hospital room?: Total Help needed climbing 3-5 steps with a railing? : Total 6 Click Score: 7    End of Session Equipment Utilized During Treatment: Oxygen Activity Tolerance: Patient  limited by lethargy;Other (comment) (nausea) Patient left: in bed;with call bell/phone within reach;with bed alarm set;with family/visitor present Nurse Communication: Mobility status PT Visit Diagnosis: History of falling (Z91.81);Muscle weakness (generalized) (M62.81)    Time: 1610-9604 PT Time Calculation (min) (ACUTE ONLY): 19 min   Charges:   PT Evaluation $PT Eval Moderate Complexity: 1 Mod PT Treatments $Therapeutic Exercise: 8-22 mins        Basilia Jumbo PT, DPT 02/09/22 3:09 PM [phone]

## 2022-02-09 NOTE — Evaluation (Signed)
Occupational Therapy Evaluation Patient Details Name: Tiffany [name] MRN: [id number] DOB: 1961/04/01 Today's Date: 02/09/2022   History of Present Illness 61 y.o. female with medical history significant for asthma, COPD, bipolar 1 disorder, GERD and migraine, who presented to the ER on 02/07/2022 with intractable nausea and vomiting for the last 8 days.   Clinical Impression   Pt was seen for OT evaluation this date. Prior to hospital admission, pt was living in a hotel room, level entry, and has relied on her spouse for significant assist with all aspects of care and has not gotten out of the bed since Nov 22 hospital admission per spouse report. Pt endorses nausea currently. Currently pt demonstrates impairments in strength, activity tolerance, balance, and body habitus as described below (See OT problem list) which functionally limit her ability to perform ADL/self-care tasks. Pt currently requires MAX A for bed level bathing, dressing, toileting, and MIN-MOD A for bed level grooming and self feeding tasks.  Pt would benefit from skilled OT services to address noted impairments and functional limitations (see below for any additional details) in order to maximize safety and independence while minimizing falls risk and caregiver burden. Upon hospital discharge, recommend STR to maximize pt safety and return to PLOF.       Recommendations for follow up therapy are one component of a multi-disciplinary discharge planning process, led by the attending physician.  Recommendations may be updated based on patient status, additional functional criteria and insurance authorization.   Follow Up Recommendations  Skilled nursing-short term rehab (<3 hours/day)    Assistance Recommended at Discharge Intermittent Supervision/Assistance  Patient can return home with the following Assist for transportation;Assistance with cooking/housework;Help with stairs or ramp for entrance;Direct supervision/assist for  medications management;A lot of help with bathing/dressing/bathroom;Two people to help with walking and/or transfers    Functional Status Assessment  Patient has had a recent decline in their functional status and demonstrates the ability to make significant improvements in function in a reasonable and predictable amount of time.  Equipment Recommendations  Other (comment) (defer to next venue, anticipate may benefit from bariatric [facility])    Recommendations for Other Services       Precautions / Restrictions Precautions Precautions: Fall Restrictions Weight Bearing Restrictions: No      Mobility Bed Mobility Overal bed mobility: Needs Assistance Bed Mobility: Rolling Rolling: Max assist              Transfers                   General transfer comment: not attempted      Balance                                           ADL either performed or assessed with clinical judgement   ADL Overall ADL's : Needs assistance/impaired                                       General ADL Comments: Pt currently requires MAX A for bed level bathing, dressing, and toileting tasks, MIN-MOD A for bed level grooming tasks     Vision         Perception     Praxis      Pertinent Vitals/Pain Pain Assessment Pain Assessment: No/denies pain  Hand Dominance Right   Extremity/Trunk Assessment Upper Extremity Assessment Upper Extremity Assessment: Generalized weakness   Lower Extremity Assessment Lower Extremity Assessment: Generalized weakness       Communication Communication Communication: No difficulties   Cognition Arousal/Alertness: Awake/alert Behavior During Therapy: WFL for tasks assessed/performed Overall Cognitive Status: Within Functional Limits for tasks assessed                                       General Comments       Exercises Other Exercises Other Exercises: Pt performed BLE ex with  spouse assisting to demo what she has been doing at home   Shoulder Instructions      Home Living Family/patient expects to be discharged to:: Private residence (pt/spouse report livnig in a hotel) Living Arrangements: Spouse/significant other Available Help at Discharge: Family;Available 24 hours/day Type of Home: Other(Comment) (hotel) Home Access: Level entry     Home Layout: One level     Bathroom Shower/Tub: Chief Strategy Officer: Standard     Home Equipment: Tub bench   Additional Comments: Lives with her sig other (Eddie) in a first floor motel room      Prior Functioning/Environment Prior Level of Function : Needs assist;History of Falls (last six months)       Physical Assist : ADLs (physical);Mobility (physical) Mobility (physical): Bed mobility;Transfers;Gait ADLs (physical): Grooming;Bathing;Dressing;Toileting;IADLs Mobility Comments: significant falls hisory, pt/spouse report pt has been bed bound since recent admission in Nov 22 ADLs Comments: spouse reports heavy assist for all ADL at bed level since recent admission in Nov 22        OT Problem List: Decreased strength;Obesity;Impaired balance (sitting and/or standing);Decreased activity tolerance;Decreased knowledge of use of DME or AE;Impaired UE functional use      OT Treatment/Interventions: Self-care/ADL training;Therapeutic exercise;Therapeutic activities;DME and/or AE instruction;Patient/family education;Balance training    OT Goals(Current goals can be found in the care plan section) Acute Rehab OT Goals Patient Stated Goal: get stronger OT Goal Formulation: With patient/family Time For Goal Achievement: 02/23/22 Potential to Achieve Goals: Good ADL Goals Pt Will Perform Grooming: sitting;with set-up;with supervision Pt/caregiver will Perform Home Exercise Program: Both right and left upper extremity;Increased strength;With written HEP provided;Independently Additional ADL Goal  #1: Pt will perform bed mobility requiring MODA  in anticipation of ADL and to support positioning changes to minimize pressure injury.  OT Frequency: Min 2X/week    Co-evaluation              AM-PAC OT "6 Clicks" Daily Activity     Outcome Measure Help from another person eating meals?: A Little Help from another person taking care of personal grooming?: A Little Help from another person toileting, which includes using toliet, bedpan, or urinal?: Total Help from another person bathing (including washing, rinsing, drying)?: A Lot Help from another person to put on and taking off regular upper body clothing?: A Lot Help from another person to put on and taking off regular lower body clothing?: A Lot 6 Click Score: 13   End of Session    Activity Tolerance: Patient tolerated treatment well Patient left: in bed;with call bell/phone within reach;with bed alarm set;with family/visitor present  OT Visit Diagnosis: Other abnormalities of gait and mobility (R26.89);Repeated falls (R29.6);Muscle weakness (generalized) (M62.81)                Time: 9381-0175 OT Time Calculation (min): 13  min Charges:  OT General Charges $OT Visit: 1 Visit OT Evaluation $OT Eval Moderate Complexity: 1 Mod  Arman Filter., MPH, MS, OTR/L ascom [phone] 02/09/22, 1:56 PM

## 2022-02-09 NOTE — Progress Notes (Signed)
Progress Note   Patient: Tiffany [name] GYI:[id number] DOB: [birth year] DOA: 02/07/2022     1 DOS: the patient was seen and examined on 02/09/2022   Brief hospital course: KADA FRIESEN is a 61 y.o. female with medical history significant for asthma, COPD, bipolar 1 disorder, GERD and migraine, who presented to the ER on 02/07/2022 with intractable nausea and vomiting for the last 8 days.  She denied having any diarrhea but reported lower abdominal discomfort more on the right than the left.  Pt had been seen in the ER couple of days prior for her nausea and vomiting were managed with antiemetics and hydration and therefore was thought to be stable for discharge given tolerating PO intake at that time.  In the ED this time, labs showed K 2.7, Mg 1.4.   CT abdomen/pelvis was non-acute but showed a dilated appendix without signs of appendicitis.  Admitted to medicine service with general surgery consulted to review appendix findings on imaging.    Patient on supportive care for likely gastroenteritis and electrolytes are being corrected.   Assessment and Plan: * Intractable nausea and vomiting- (present on admission) Present on admission, persistent for over a week per patient.  No abdominal pain other than abdominal wall muscles secondary to strain from vomiting. 2/10: pt notes not yet improving -- IV fluids for hydration -- Antiemetics as needed - added Compazine for additional agent --monitor and replace electrolytes  Hyponatremia- (present on admission) Resolved with IV hydration.   Present on admission, mild with sodium 134.  Likely hypovolemic in the setting of nausea vomiting.  Remains on IV fluids due to N/V.  Monitored BMP  Hypomagnesemia- (present on admission) Present on admission with Mg 1.4. Replacement underway.  Monitor and replace magnesium as needed 2/10: Mg 1.7  Hypokalemia- (present on admission) Present on admission.  K is being replaced with IV fluid additive.   Monitor and replace K as needed. 2/10: K still 3.2, further replacement underway  Pressure injury of skin- (present on admission) Present admission.  I agree with the wound description as outlined below. Frequent repositioning of patient. Local wound care and monitor closely  Pressure Injury 02/07/22 Buttocks Left Stage 2 -  Partial thickness loss of dermis presenting as a shallow open injury with a red, pink wound bed without slough. red and flaky (Active)  02/07/22 2238  Location: Buttocks  Location Orientation: Left  Staging: Stage 2 -  Partial thickness loss of dermis presenting as a shallow open injury with a red, pink wound bed without slough.  Wound Description (Comments): red and flaky  Present on Admission: Yes     Pressure Injury Buttocks Right Stage 2 -  Partial thickness loss of dermis presenting as a shallow open injury with a red, pink wound bed without slough. pink and flaky (Active)     Location: Buttocks  Location Orientation: Right  Staging: Stage 2 -  Partial thickness loss of dermis presenting as a shallow open injury with a red, pink wound bed without slough.  Wound Description (Comments): pink and flaky  Present on Admission: Yes        Dyslipidemia- (present on admission) Hold statin until tolerating PO intake better  History of migraine Continue as needed Imitrex  Asthma, chronic- (present on admission) Asthma/COPD is stable without exacerbation. -- Continue Spiriva -- Pulmicort nebs  Bipolar 1 disorder (HCC)- (present on admission) Appears stable.   Continue Lamictal and Cymbalta  GERD (gastroesophageal reflux disease)- (present on admission) Continue PPI (  IV for now with nausea vomiting ongoing)  Thrombocytopenia (HCC)- (present on admission) Platelets on admission 140.   Today plts 120>>126.  Suspect at due to infection. SCDs for VTE prophylaxis. Monitor CBC. Avoid heparin products.        Subjective: Pt awake with fiance at  bedside when seen.  She reports no improvement with N/V yet.  Was not able to tolerate sips of water with PO meds this AM.  No fever/chills.  No other acute complaints.  She is agreeable to try some clear liquids given her glucose was a bit low on AM labs today.  Physical Exam: Vitals:   02/08/22 2048 02/09/22 0612 02/09/22 0803 02/09/22 1536  BP:  111/79 118/73 124/76  Pulse:  79 74 70  Resp:  16 18 18   Temp:  98.7 F (37.1 C) 98.9 F (37.2 C) 98.2 F (36.8 C)  TempSrc:  Oral  Oral  SpO2: 98% 100% 98% 100%  Weight:      Height:       General exam: awake, alert, no acute distress HEENT: moist mucus membranes, hearing grossly normal  Respiratory system: normal respiratory effort. Cardiovascular system: RRR, mild non-pitting BLE edema.   Gastrointestinal system: soft, mildly tender without rebound or guarding Central nervous system: A&O x3. Grossly non-focal exam Skin: dry, intact, normal temperature Psychiatry: normal mood, congruent affect, judgement and insight appear normal   Data Reviewed:  Labs reviewed, notable for K 2.7>>3.2, Glucose 69, Mg 1.4>>1.7, Hbg stable 9.1, Plts 120>>126k  Family Communication: fiance at bedside on rounds  Disposition: Status is: Inpatient Remains inpatient appropriate because: severity of illness with ongoing N/V not tolerating PO intake, remains on IV fluids.      Planned Discharge Destination: Skilled nursing facility     Time spent: 35 minutes  Author: , DO 02/09/2022 3:51 PM  For on call review [email or web address].

## 2022-02-10 LAB — BASIC METABOLIC PANEL
Anion gap: 8 (ref 5–15)
BUN: <5 mg/dL — ABNORMAL LOW (ref 6–20)
CO2: 29 mmol/L (ref 22–32)
Calcium: 7.8 mg/dL — ABNORMAL LOW (ref 8.9–10.3)
Chloride: 97 mmol/L — ABNORMAL LOW (ref 98–111)
Creatinine, Ser: 0.43 mg/dL — ABNORMAL LOW (ref 0.44–1.00)
GFR, Estimated: >60 mL/min (ref >60)
Glucose, Bld: 82 mg/dL (ref 70–99)
Potassium: 3 mmol/L — ABNORMAL LOW (ref 3.5–5.1)
Sodium: 134 mmol/L — ABNORMAL LOW (ref 135–145)

## 2022-02-10 LAB — MAGNESIUM: Magnesium: 1.6 mg/dL — ABNORMAL LOW (ref 1.7–2.4)

## 2022-02-10 MED ORDER — POTASSIUM CHLORIDE 10 MEQ/100ML IV SOLN
10.0000 meq | INTRAVENOUS | Status: AC
  Administered 2022-02-10 (×4): 10 meq via INTRAVENOUS
  Filled 2022-02-10 (×3): qty 100
  Filled 2022-02-10: qty 300
  Filled 2022-02-10: qty 100

## 2022-02-10 MED ORDER — MAGNESIUM SULFATE 4 GM/100ML IV SOLN
4.0000 g | Freq: Once | INTRAVENOUS | Status: AC
  Administered 2022-02-10: 4 g via INTRAVENOUS
  Filled 2022-02-10: qty 100

## 2022-02-10 NOTE — Progress Notes (Signed)
Progress Note   Patient: Tiffany [name] [id number] DOB: October 10, 1961 DOA: 02/07/2022     2 DOS: the patient was seen and examined on 02/10/2022   Brief hospital course: Tiffany [name] is a 61 y.o. female with medical history significant for asthma, COPD, bipolar 1 disorder, GERD and migraine, who presented to the ER on 02/07/2022 with intractable nausea and vomiting for the last 8 days.  She denied having any diarrhea but reported lower abdominal discomfort more on the right than the left.  Pt had been seen in the ER couple of days prior for her nausea and vomiting were managed with antiemetics and hydration and therefore was thought to be stable for discharge given tolerating PO intake at that time.  In the ED this time, labs showed K 2.7, Mg 1.4.   CT abdomen/pelvis was non-acute but showed a dilated appendix without signs of appendicitis.  Admitted to medicine service with general surgery consulted to review appendix findings on imaging.    Patient on supportive care for likely gastroenteritis and electrolytes are being corrected.   Assessment and Plan: * Intractable nausea and vomiting- (present on admission) Present on admission, persistent for over a week per patient.  No abdominal pain other than abdominal wall muscles secondary to strain from vomiting. 2/10: pt notes not yet improving -- IV fluids for hydration -- Antiemetics as needed - added Compazine for additional agent --monitor and replace electrolytes  Hyponatremia- (present on admission) Resolved with IV hydration.   Present on admission, mild with sodium 134.  Likely hypovolemic in the setting of nausea vomiting.  Remains on IV fluids due to N/V.  Monitored BMP  Hypomagnesemia- (present on admission) Present on admission with Mg 1.4. Replacement underway.  Monitor and replace magnesium as needed 2/10: Mg 1.7  Hypokalemia- (present on admission) Present on admission.  K is being replaced with IV fluid additive.   Monitor and replace K as needed. 2/11: K 3.0, further replacement underway  Pressure injury of skin- (present on admission) Present admission.  I agree with the wound description as outlined below. Frequent repositioning of patient. Local wound care and monitor closely  Pressure Injury 02/07/22 Buttocks Left Stage 2 -  Partial thickness loss of dermis presenting as a shallow open injury with a red, pink wound bed without slough. red and flaky (Active)  02/07/22 2238  Location: Buttocks  Location Orientation: Left  Staging: Stage 2 -  Partial thickness loss of dermis presenting as a shallow open injury with a red, pink wound bed without slough.  Wound Description (Comments): red and flaky  Present on Admission: Yes     Pressure Injury Buttocks Right Stage 2 -  Partial thickness loss of dermis presenting as a shallow open injury with a red, pink wound bed without slough. pink and flaky (Active)     Location: Buttocks  Location Orientation: Right  Staging: Stage 2 -  Partial thickness loss of dermis presenting as a shallow open injury with a red, pink wound bed without slough.  Wound Description (Comments): pink and flaky  Present on Admission: Yes        Dyslipidemia- (present on admission) Hold statin until tolerating PO intake better  History of migraine Continue as needed Imitrex  Asthma, chronic- (present on admission) Asthma/COPD is stable without exacerbation. -- Continue Spiriva -- Pulmicort nebs  Bipolar 1 disorder ([place])- (present on admission) Appears stable.   Continue Lamictal and Cymbalta  GERD (gastroesophageal reflux disease)- (present on admission) Continue PPI (IV  for now with nausea vomiting ongoing)  Thrombocytopenia ([place])- (present on admission) Platelets on admission 140.   Today plts 120>>126.  Suspect at due to infection. SCDs for VTE prophylaxis. Monitor CBC. Avoid heparin products.        Subjective: Pt awake with fiance at bedside  when seen. She reports persistent nausea and not able to tolerate any clear liquids.  Only vomits occasional but says nausea is constant.  She had not used much PRN nausea medication, encouraged she ask for it if needed.    Physical Exam: Vitals:   02/09/22 1948 02/09/22 2006 02/10/22 0421 02/10/22 0814  BP:  120/85 128/75 122/79  Pulse:  71 80 86  Resp:  16 16 18   Temp:  98.4 F (36.9 C) 98.7 F (37.1 C) 97.9 F (36.6 C)  TempSrc:  Oral Oral Oral  SpO2: 98% 100% 99% 100%  Weight:      Height:       General exam: awake, alert, no acute distress HEENT: moist mucus membranes, hearing grossly normal  Respiratory system: normal respiratory effort. Cardiovascular system: RRR, mild non-pitting BLE edema.   Gastrointestinal system: soft, mildly tender without rebound or guarding Central nervous system: A&O x3. Grossly non-focal exam Skin: dry, intact, normal temperature Psychiatry: normal mood, congruent affect, judgement and insight appear normal   Data Reviewed:  Labs reviewed, notable for Na 134, K 3.0, Cl 97, Cr 0.43, Ca 7.8, Mg 1.6  Family Communication: fiance at bedside on rounds  Disposition: Status is: Inpatient Remains inpatient appropriate because: severity of illness with ongoing N/V not tolerating PO intake, remains on IV fluids.      Planned Discharge Destination: Skilled nursing facility     Time spent: 35 minutes  Author: Ezekiel Slocumb, DO 02/10/2022 3:40 PM  For on call review [email or web address].

## 2022-02-11 DIAGNOSIS — E66813 Obesity, class 3: Secondary | ICD-10-CM | POA: Diagnosis present

## 2022-02-11 LAB — BASIC METABOLIC PANEL
Anion gap: 4 — ABNORMAL LOW (ref 5–15)
BUN: <5 mg/dL — ABNORMAL LOW (ref 6–20)
CO2: 31 mmol/L (ref 22–32)
Calcium: 7.7 mg/dL — ABNORMAL LOW (ref 8.9–10.3)
Chloride: 98 mmol/L (ref 98–111)
Creatinine, Ser: 0.45 mg/dL (ref 0.44–1.00)
GFR, Estimated: >60 mL/min (ref >60)
Glucose, Bld: 82 mg/dL (ref 70–99)
Potassium: 3.2 mmol/L — ABNORMAL LOW (ref 3.5–5.1)
Sodium: 133 mmol/L — ABNORMAL LOW (ref 135–145)

## 2022-02-11 LAB — GLUCOSE, CAPILLARY: Glucose-Capillary: 78 mg/dL (ref 70–99)

## 2022-02-11 LAB — MAGNESIUM: Magnesium: 1.8 mg/dL (ref 1.7–2.4)

## 2022-02-11 MED ORDER — MECLIZINE HCL 25 MG PO TABS
25.0000 mg | ORAL_TABLET | Freq: Once | ORAL | Status: AC
  Administered 2022-02-11: 25 mg via ORAL
  Filled 2022-02-11: qty 1

## 2022-02-11 MED ORDER — POTASSIUM CHLORIDE 10 MEQ/100ML IV SOLN
10.0000 meq | INTRAVENOUS | Status: AC
  Administered 2022-02-11 (×4): 10 meq via INTRAVENOUS
  Filled 2022-02-11: qty 100

## 2022-02-11 NOTE — Progress Notes (Signed)
Progress Note   Patient: Tiffany [name] HRC:[id number] DOB: 1961/08/29 DOA: 02/07/2022     3 DOS: the patient was seen and examined on 02/11/2022   Brief hospital course: Tiffany [name] is a 61 y.o. female with medical history significant for asthma, COPD, bipolar 1 disorder, GERD and migraine, who presented to the ER on 02/07/2022 with intractable nausea and vomiting for the last 8 days.  She denied having any diarrhea but reported lower abdominal discomfort more on the right than the left.  Pt had been seen in the ER couple of days prior for her nausea and vomiting were managed with antiemetics and hydration and therefore was thought to be stable for discharge given tolerating PO intake at that time.  In the ED this time, labs showed K 2.7, Mg 1.4.   CT abdomen/pelvis was non-acute but showed a dilated appendix without signs of appendicitis.  Admitted to medicine service with general surgery consulted to review appendix findings on imaging.    Patient on supportive care for likely gastroenteritis and electrolytes are being corrected.   Assessment and Plan: * Intractable nausea and vomiting- (present on admission) Present on admission, persistent for over a week per patient.  No abdominal pain other than abdominal wall muscles secondary to strain from vomiting. 2/12: pt still reports not improving -- IV fluids for hydration -- trial Meclizine (?vertigo etiology) --Scheduled Reglan -- Antiemetics as needed --monitor and replace electrolytes  Hyponatremia- (present on admission) Resolved with IV hydration.   Present on admission, mild with sodium 134.  Likely hypovolemic in the setting of nausea vomiting.  Remains on IV fluids due to N/V.  Monitored BMP  Hypomagnesemia- (present on admission) Present on admission with Mg 1.4. Replacement underway.  Monitor and replace magnesium as needed 2/10: Mg 1.7  Hypokalemia- (present on admission) Present on admission.  K is being replaced  with IV fluid additive.  Monitor and replace K as needed. 2/12: K 3.2, further replacement underway  Pressure injury of skin- (present on admission) Present admission.  I agree with the wound description as outlined below. Frequent repositioning of patient. Local wound care and monitor closely  Pressure Injury 02/07/22 Buttocks Left Stage 2 -  Partial thickness loss of dermis presenting as a shallow open injury with a red, pink wound bed without slough. red and flaky (Active)  02/07/22 2238  Location: Buttocks  Location Orientation: Left  Staging: Stage 2 -  Partial thickness loss of dermis presenting as a shallow open injury with a red, pink wound bed without slough.  Wound Description (Comments): red and flaky  Present on Admission: Yes     Pressure Injury Buttocks Right Stage 2 -  Partial thickness loss of dermis presenting as a shallow open injury with a red, pink wound bed without slough. pink and flaky (Active)     Location: Buttocks  Location Orientation: Right  Staging: Stage 2 -  Partial thickness loss of dermis presenting as a shallow open injury with a red, pink wound bed without slough.  Wound Description (Comments): pink and flaky  Present on Admission: Yes        Obesity, Class III, BMI 40-49.9 (morbid obesity) (HCC)- (present on admission) Body mass index is 42.68 kg/m. Complicates overall care and prognosis.  Recommend lifestyle modifications including physical activity and diet for weight loss and overall long-term health.   Dyslipidemia- (present on admission) Hold statin until tolerating PO intake better  History of migraine Continue as needed Imitrex  Asthma, chronic- (  present on admission) Asthma/COPD is stable without exacerbation. -- Continue Spiriva -- Pulmicort nebs  Bipolar 1 disorder (HCC)- (present on admission) Appears stable.   Continue Lamictal and Cymbalta  GERD (gastroesophageal reflux disease)- (present on admission) Continue PPI  (IV for now with nausea vomiting ongoing)  Thrombocytopenia (HCC)- (present on admission) Platelets on admission 140.   Today plts 120>>126.  Suspect at due to infection. SCDs for VTE prophylaxis. Monitor CBC. Avoid heparin products.        Subjective: Pt seen with fiance at bedside.  She reports ongoing nausea but less vomiting.  Still not tolerating PO intake very much.  Wants chicken broth but kitchen said she can't have it.  She reports some dizziness / room spinning with being moved around in the bed.    Physical Exam: Vitals:   02/10/22 2007 02/10/22 2037 02/11/22 0358 02/11/22 0814  BP: 131/79  (!) 136/58 103/65  Pulse: 81  91 98  Resp: 16  16 18   Temp: 98.3 F (36.8 C)  98.1 F (36.7 C) 98.7 F (37.1 C)  TempSrc: Oral  Oral Oral  SpO2: 100% 100% 98% 93%  Weight:      Height:       General exam: awake, alert, no acute distress, morbidly obese Respiratory system: CTAB, no wheezes, rales or rhonchi, normal respiratory effort. Cardiovascular system: normal S1/S2, RRR Gastrointestinal system: soft, NT, ND, no HSM felt, +bowel sounds. Central nervous system: A&O x3. no gross focal neurologic deficits, normal speech Skin: dry, intact, normal temperature Psychiatry: normal mood, congruent affect, judgement and insight appear normal   Data Reviewed:  Labs reviewed and notable for Na 133, K 3.2, BUN <5, Ca 7.7  Family Communication: fiance at bedside  Disposition: Status is: Inpatient Remains inpatient appropriate because: not tolerating PO intake due to N/V on IV fluids with ongoing electrolyte replacement.          Planned Discharge Destination: Skilled nursing facility     Time spent: 35 minutes  Author: , DO 02/11/2022 2:16 PM  For on call review [email or web address].

## 2022-02-11 NOTE — Assessment & Plan Note (Signed)
Body mass index is 42.68 kg/m. Complicates overall care and prognosis.  Recommend lifestyle modifications including physical activity and diet for weight loss and overall long-term health.

## 2022-02-12 DIAGNOSIS — K449 Diaphragmatic hernia without obstruction or gangrene: Secondary | ICD-10-CM

## 2022-02-12 DIAGNOSIS — Z8719 Personal history of other diseases of the digestive system: Secondary | ICD-10-CM

## 2022-02-12 LAB — BASIC METABOLIC PANEL
Anion gap: 7 (ref 5–15)
BUN: <5 mg/dL — ABNORMAL LOW (ref 6–20)
CO2: 29 mmol/L (ref 22–32)
Calcium: 8.1 mg/dL — ABNORMAL LOW (ref 8.9–10.3)
Chloride: 99 mmol/L (ref 98–111)
Creatinine, Ser: 0.5 mg/dL (ref 0.44–1.00)
GFR, Estimated: >60 mL/min (ref >60)
Glucose, Bld: 89 mg/dL (ref 70–99)
Potassium: 3.8 mmol/L (ref 3.5–5.1)
Sodium: 135 mmol/L (ref 135–145)

## 2022-02-12 LAB — MAGNESIUM: Magnesium: 1.6 mg/dL — ABNORMAL LOW (ref 1.7–2.4)

## 2022-02-12 MED ORDER — ONDANSETRON HCL 4 MG PO TABS
4.0000 mg | ORAL_TABLET | Freq: Four times a day (QID) | ORAL | Status: DC
  Administered 2022-02-12: 4 mg via ORAL
  Filled 2022-02-12: qty 1

## 2022-02-12 MED ORDER — MAGNESIUM SULFATE 2 GM/50ML IV SOLN
2.0000 g | Freq: Once | INTRAVENOUS | Status: AC
  Administered 2022-02-12: 2 g via INTRAVENOUS
  Filled 2022-02-12: qty 50

## 2022-02-12 MED ORDER — ORAL CARE MOUTH RINSE
15.0000 mL | Freq: Two times a day (BID) | OROMUCOSAL | Status: DC
  Administered 2022-02-12 – 2022-02-14 (×4): 15 mL via OROMUCOSAL

## 2022-02-12 MED ORDER — ONDANSETRON HCL 4 MG/2ML IJ SOLN
4.0000 mg | Freq: Four times a day (QID) | INTRAMUSCULAR | Status: DC
  Administered 2022-02-12 – 2022-02-14 (×8): 4 mg via INTRAVENOUS
  Filled 2022-02-12 (×8): qty 2

## 2022-02-12 NOTE — Assessment & Plan Note (Addendum)
Noted in chart review, PCP office note from 08/21/2021 and GI consult notes from admission here late October 2022.  Appears EGD at that time showed grade D esophagitis with an esophageal diverticulum and paraesophageal hernia with history of Nissen fundoplication with intact wrap.   --EGD this admission neg for esophagitis

## 2022-02-12 NOTE — Consult Note (Addendum)
GI Inpatient Consult Note  Reason for Consult: Nausea/vomiting    Attending Requesting Consult: Dr. Nicole Kindred, DO  History of Present Illness: Tiffany [name] is a 61 y.o. female seen for evaluation of nausea/vomiting at the request of hospitalist - Dr. Nicole Kindred. Patient has a PMH of morbid obesity, asthma/COPD on 2L O2 via [place] chronically, migraines, GERD with esophagitis, and Bipolar I disorder. She presented to the [facility] ED 2/8 last week for chief complaint of 8-day history of intractable nausea and vomiting.  She actually presented to the [facility] ED 2/6 last week for same chief complaint where she was found to be hypokalemic (2.8), hypomagnesemic, and hypophosphatemic and received potassium supplementation, anti-emetics, and passed PO challenge and was discharged. She was unable to tolerate any PO intake at home and re-presented to the ED 2/8. Upon presentation to the ED, all vital signs were within normal limits. Labs were significant for hypokalemia (2.7), hyponatremia (134), hypocalcemia (7.9), hypomagnesemia (1.4), albumin 2.0. She had CT abd/pelvis with contrast performed wdhich showed no definitive acute findings with apparent prominence of appendix without surrounding inflammatory changes to suggest acute appendicitis. She had findings of moderate-sized hiatal hernia without bowel obstruction. General Surgery consult and signed off and clinical picture not s/w appendicitis. She was given 1L bolus of IV lactated ringers, IV potassium chloride, IV Phenergan, and magnesium sulfate. She continued with supportive care for likely gastroenteritis. GI consulted today for further evaluation and management of persistent nausea and vomiting.   Patient seen and examined this afternoon resting comfortably in hospital bed. No acute events overnight. Per nursing report, patient has been refusing PO meds due to nausea. She reportedly has been tolerating clears per nursing today. Patient reports she  is severely nauseous. Anytime she tries to eat something she will have to throw it up. Per Dr. Arbutus Ped, she has not been asking for her as needed anti-emetics so they have not been given. She reportedly had some diarrhea with Metoclopramide and Meclizine. She is frustrated her symptoms are not getting better. She denies any esophageal dysphagia, odynophagia, early satiety, or epigastric abdominal pain. She does endorse hx of heartburn and reflux and feels like these symptoms might be a little worse. She has had similar hospitalizations and ED visits previously for nausea and vomiting. She had EGD 07/2021 by Dr. Leafy Half at [facility] which showed LA Grade D esophagitis likely due to stasis, distal esophageal diverticulum, small paraesophageal hernia, evidence of prior fundoplication with intact wrap, normal stomach, and normal duodenum. She was advised to follow-up as outpatient to discuss endoscopic treatment of diverticulum versus surgical intervention. She has not followed up as an outpatient. She did have her PPI increased from once daily to twice daily.    Summary of Previous GI Procedures:  EGD 08/17/2021 Impression:             - Diverticulum in the distal esophagus. - LA Grade D esophagitis. Likely due to stasis. - Small paraesophageal hernia. - A fundoplication was found. The wrap appears intact. - Normal stomach. - Normal examined duodenum. - No specimens collected.   Past Medical History:  Past Medical History:  Diagnosis Date   Asthma    Bipolar 1 disorder ([place])    COPD (chronic obstructive pulmonary disease) ([place])    Enlarged heart    GERD (gastroesophageal reflux disease)    Migraines     Problem List: Patient Active Problem List   Diagnosis Date Noted   History of esophagitis 02/12/2022  Hiatal hernia 02/12/2022   Obesity, Class III, BMI 40-49.9 (morbid obesity) ([place]) 02/11/2022   Pressure injury of skin 02/08/2022   Hypomagnesemia 02/08/2022   Hyponatremia 02/08/2022    Bipolar 1 disorder ([place]) 02/08/2022   Asthma, chronic 02/08/2022   History of migraine 02/08/2022   Dyslipidemia 02/08/2022   Intractable nausea and vomiting 02/07/2022   Hypoxia    Acute respiratory failure with hypoxia and hypercarbia [facility])    Ambulatory dysfunction    Primary hypertension    Encephalopathy 11/13/2021   Hypokalemia 10/29/2021   Frequent falls 10/29/2021   Refractory nausea and vomiting 10/29/2021   Thrombocytopenia ([place]) 10/29/2021   GERD (gastroesophageal reflux disease) 10/29/2021    Past Surgical History: Past Surgical History:  Procedure Laterality Date   ABDOMINAL HYSTERECTOMY     ABDOMINAL SURGERY     "For acid reflux"   CESAREAN SECTION      Allergies: Allergies  Allergen Reactions   Dairy Aid [Tilactase] Other (See Comments)    Exacerbates asthma   Egg [Eggs Or Egg-Derived Products] Other (See Comments)    Exacerbated asthma   Milk-Related Compounds     Home Medications: Medications Prior to Admission  Medication Sig Dispense Refill Last Dose   acetaminophen (TYLENOL) 325 MG tablet Take 2 tablets (650 mg total) by mouth every 6 (six) hours as needed for mild pain (or Fever >/= 101).      atorvastatin (LIPITOR) 80 MG tablet Take 80 mg by mouth daily.      diclofenac Sodium (VOLTAREN) 1 % GEL Apply 4 g topically 4 (four) times daily.      DULoxetine (CYMBALTA) 30 MG capsule Take 30 mg by mouth daily.      famotidine (PEPCID) 20 MG tablet Take 1 tablet (20 mg total) by mouth 2 (two) times daily. 60 tablet 0    FLOVENT HFA 110 MCG/ACT inhaler Inhale 2 puffs into the lungs 2 (two) times daily.      folic acid (FOLVITE) 1 MG tablet Take 5 tablets (5 mg total) by mouth daily.      lamoTRIgine (LAMICTAL) 25 MG tablet Take 50 mg by mouth daily.      loratadine (CLARITIN) 10 MG tablet Take 1 tablet (10 mg total) by mouth every evening.      metoCLOPramide (REGLAN) 10 MG tablet Take 1 tablet (10 mg total) by mouth 3 (three) times daily before meals.       Multiple Vitamin (MULTIVITAMIN WITH MINERALS) TABS tablet Take 1 tablet by mouth daily.      Nutritional Supplements (FEEDING SUPPLEMENT, KATE FARMS STANDARD 1.4,) LIQD liquid Take 325 mLs by mouth 3 (three) times daily between meals.      ondansetron (ZOFRAN-ODT) 4 MG disintegrating tablet Take 1 tablet (4 mg total) by mouth every 8 (eight) hours as needed for nausea or vomiting. 20 tablet 0    pantoprazole (PROTONIX) 40 MG tablet Take 40 mg by mouth 2 (two) times daily.      polyethylene glycol (MIRALAX / GLYCOLAX) 17 g packet Take 17 g by mouth 2 (two) times daily. 14 each 0    SPIRIVA HANDIHALER 18 MCG inhalation capsule 1 capsule at bedtime.      SUMAtriptan (IMITREX) 100 MG tablet Take 100 mg by mouth daily as needed.      traZODone (DESYREL) 100 MG tablet Take 1 tablet (100 mg total) by mouth at bedtime as needed for sleep.      Vitamin D, Ergocalciferol, (DRISDOL) 1.25 MG (50000 UNIT) CAPS capsule  Take 1 capsule (50,000 Units total) by mouth every 7 (seven) days. 5 capsule    ° °Home medication reconciliation was completed with the patient.  ° °Scheduled Inpatient Medications: °  ° budesonide  0.25 mg Nebulization BID  ° DULoxetine  30 mg Oral Daily  ° lamoTRIgine  50 mg Oral Daily  ° mouth rinse  15 mL Mouth Rinse BID  ° ondansetron  4 mg Oral Q6H  ° Or  ° ondansetron (ZOFRAN) IV  4 mg Intravenous Q6H  ° pantoprazole (PROTONIX) IV  40 mg Intravenous Q12H  ° polyethylene glycol  17 g Oral BID  ° tiotropium  1 capsule Inhalation Daily  ° ° °Continuous Inpatient Infusions: °  ° dextrose 5% lactated ringers with KCl 20 mEq/L 75 mL/hr at 02/11/22 0434  ° ° °PRN Inpatient Medications:  °acetaminophen **OR** acetaminophen, magnesium hydroxide, prochlorperazine, SUMAtriptan, traZODone ° °Family History: °family history includes Hypertension in her mother.  The patient's family history is negative for inflammatory bowel disorders, GI malignancy, or solid organ transplantation. ° °Social History:  °  reports that she has never smoked. She has never used smokeless tobacco. She reports that she does not drink alcohol and does not use drugs. The patient denies ETOH, tobacco, or drug use.  ° °Review of Systems: °Constitutional: Weight is stable.  °Eyes: No changes in vision. °ENT: No oral lesions, sore throat.  °GI: see HPI.  °Heme/Lymph: No easy bruising.  °CV: No chest pain.  °GU: No hematuria.  °Integumentary: No rashes.  °Neuro: No headaches.  °Psych: No depression/anxiety.  °Endocrine: No heat/cold intolerance.  °Allergic/Immunologic: No urticaria.  °Resp: No cough, SOB.  °Musculoskeletal: No joint swelling.  °  °Physical Examination: °BP 115/64    Pulse 88    Temp 98.5 °F (36.9 °C)    Resp 17    Ht 5' 9" (1.753 m)    Wt 131.1 kg    SpO2 97%    BMI 42.68 kg/m²  °Non-toxic appearing female in bed. No accessory muscle use.  °Gen: NAD, alert and oriented x 4 °HEENT: PEERLA, EOMI, °Neck: supple, no JVD or thyromegaly °Chest: CTA bilaterally, no wheezes, crackles, or other adventitious sounds °CV: RRR, no m/g/c/r °Abd: soft, NT, ND, +BS in all four quadrants; no HSM, guarding, ridigity, or rebound tenderness °Ext: no edema, well perfused with 2+ pulses, °Skin: no rash or lesions noted °Lymph: no LAD ° °Data: °Lab Results  °Component Value Date  ° WBC 3.7 (L) 02/09/2022  ° HGB 9.1 (L) 02/09/2022  ° HCT 27.8 (L) 02/09/2022  ° MCV 85.8 02/09/2022  ° PLT 126 (L) 02/09/2022  ° °Recent Labs  °Lab 02/07/22 °1222 02/08/22 °0449 02/09/22 °0531  °HGB 9.9* 9.1* 9.1*  ° °Lab Results  °Component Value Date  ° NA 135 02/12/2022  ° K 3.8 02/12/2022  ° CL 99 02/12/2022  ° CO2 29 02/12/2022  ° BUN <5 (L) 02/12/2022  ° CREATININE 0.50 02/12/2022  ° °Lab Results  °Component Value Date  ° ALT 11 02/07/2022  ° AST 19 02/07/2022  ° ALKPHOS 44 02/07/2022  ° BILITOT 0.9 02/07/2022  ° °No results for input(s): APTT, INR, PTT in the last 168 hours. ° °CT abd/pelvis with contrast 02/07/22: °IMPRESSION: °1. No definite acute findings. Apparent  prominence of the appendix °without surrounding inflammatory changes to suggest acute °appendicitis in this patient without leukocytosis. Recommend °clinical follow up. °2. Moderate size hiatal hernia.  No evidence of bowel obstruction. °3. Previous hysterectomy. ° °Assessment/Plan: ° °60 y/o   AA female with a PMH of Bipolar I disorder, asthma/COPD on 2L O2 via [place] chronically, GERD with esophagitis, morbid obesity, GERD, and Hx of migraines presented to the [facility] ED 2/8 for chief complaint of intractable nausea and vomiting. She has failed to respond to supportive care with antiemetics and IV fluid hydration. GI consulted today (2/13) for persistent nausea and vomiting.   Persistent nausea and vomiting - DDx includes erosive esophagitis, peptic ulcer disease, gastritis, partial gastric outlet obstruction, complex paraesophageal hernia, esophageal dysmotility, gastroparesis, medication side effects, gastroenteritis, etc  GERD with hiatal hernia - EGD 07/2021 above with findings of LA Grade D erosive esophagitis, small paraesophageal hernia, and distal esophageal diverticulum. Likely main contributing factor to ongoing issues with N/V.   Esophageal diverticulum  Electrolyte derangement  Asthma/COPD on 2L O2 chronically   Thrombocytopenia  Morbid obesity - BMI 42.68  Recommendations:  - Continue supportive care with scheduled antiemetics and IV fluid hydration - Continue Protonix IV BID for gastric protection - D/C Reglan since this can worsen diarrhea - Avoid anticholinergic medications  - Advise EGD for luminal evaluation. Since patient has eaten today, EGD will need to be performed tomorrow morning.  - Continue monitoring of electrolytes and replete as necessary  - Plan for EGD tomorrow with Dr. Alice Reichert - Continue clears for now. NPO after midnight.  - See procedure note for findings and further recommendations - Will benefit from follow-up at [facility] or other tertiary center to discuss treatment  of esophageal diverticulum + paraesophageal hernia  I reviewed the risks (including bleeding, perforation, infection, anesthesia complications, cardiac/respiratory complications), benefits and alternatives of EGD. Patient consents to proceed.    Thank you for the consult. Please call with questions or concerns.  Reeves Forth [facility] [phone] (Cell)

## 2022-02-12 NOTE — Assessment & Plan Note (Addendum)
S/p Nissen fundoplication, intact

## 2022-02-12 NOTE — Progress Notes (Signed)
Occupational Therapy Treatment Patient Details Name: Tiffany [name] MRN: [id number] DOB: 09/19/1961 Today's Date: 02/12/2022   History of present illness 61 y.o. female with medical history significant for asthma, COPD, bipolar 1 disorder, GERD and migraine, who presented to the ER on 02/07/2022 with intractable nausea and vomiting for the last 8 days.   OT comments  Pt seen for skilled co treatment with PT. Pt is agreeable to OT intervention. Mod A of 2 to EOB with assistance for trunk support and B LEs. Pt does initiate movement but needs increased assistance. Pt sitting EOB but refusing to allow B LEs to touch floor. Pt's balance ranges from mod A and progressing to 1 minute of close supervision. Pt then requesting to return to bed. She has not stood or transferred since Nov 2022. Pt returns to bed with +2 assistance and repositioned for comfort. Pt performs grooming tasks with set up A and increased time to initiate and sequence tasks. Bed alarm set and call bell within reach.    Recommendations for follow up therapy are one component of a multi-disciplinary discharge planning process, led by the attending physician.  Recommendations may be updated based on patient status, additional functional criteria and insurance authorization.    Follow Up Recommendations  Skilled nursing-short term rehab (<3 hours/day)    Assistance Recommended at Discharge Frequent or constant Supervision/Assistance  Patient can return home with the following  Assist for transportation;Assistance with cooking/housework;Help with stairs or ramp for entrance;Direct supervision/assist for medications management;A lot of help with bathing/dressing/bathroom;Two people to help with walking and/or transfers   Equipment Recommendations  Other (comment) (defer to next venue of care)           Mobility Bed Mobility Overal bed mobility: Needs Assistance Bed Mobility: Supine to Sit, Sit to Supine     Supine to sit: Mod  assist, +2 for physical assistance Sit to supine: Mod assist, +2 for physical assistance   General bed mobility comments: Pt does initiate some movement but needing increased assistance for safety    Transfers                   General transfer comment: not attempted - has not performed since 11/22     Balance Overall balance assessment: Needs assistance Sitting-balance support: Feet unsupported Sitting balance-Leahy Scale: Poor Sitting balance - Comments: min guard- mod A for static sitting balance.       Standing balance comment: deferred                           ADL either performed or assessed with clinical judgement   ADL Overall ADL's : Needs assistance/impaired     Grooming: Wash/dry hands;Wash/dry face;Bed level;Supervision/safety;Set up                                      Extremity/Trunk Assessment Upper Extremity Assessment Upper Extremity Assessment: Generalized weakness   Lower Extremity Assessment Lower Extremity Assessment: Generalized weakness        Vision Patient Visual Report: No change from baseline            Cognition Arousal/Alertness: Awake/alert Behavior During Therapy: Flat affect Overall Cognitive Status: Within Functional Limits for tasks assessed  General Comments: Pt does communicate more during this session and talking about what is on TV. She does tell therapists goals for herself.                   Pertinent Vitals/ Pain       Pain Assessment Pain Assessment: No/denies pain         Frequency  Min 2X/week        Progress Toward Goals  OT Goals(current goals can now be found in the care plan section)  Progress towards OT goals: Progressing toward goals  Acute Rehab OT Goals Patient Stated Goal: to get stronger and increase my independence OT Goal Formulation: With patient/family Time For Goal Achievement: 02/23/22 Potential to  Achieve Goals: Good  Plan Discharge plan remains appropriate;Frequency remains appropriate    Co-evaluation    PT/OT/SLP Co-Evaluation/Treatment: Yes Reason for Co-Treatment: Complexity of the patient's impairments (multi-system involvement);For patient/therapist safety;To address functional/ADL transfers PT goals addressed during session: Mobility/safety with mobility;Balance OT goals addressed during session: ADL's and self-care      AM-PAC OT "6 Clicks" Daily Activity     Outcome Measure   Help from another person eating meals?: A Little Help from another person taking care of personal grooming?: A Little Help from another person toileting, which includes using toliet, bedpan, or urinal?: Total Help from another person bathing (including washing, rinsing, drying)?: A Lot Help from another person to put on and taking off regular upper body clothing?: A Lot Help from another person to put on and taking off regular lower body clothing?: A Lot 6 Click Score: 13    End of Session    OT Visit Diagnosis: Other abnormalities of gait and mobility (R26.89);Repeated falls (R29.6);Muscle weakness (generalized) (M62.81)   Activity Tolerance Patient tolerated treatment well   Patient Left in bed;with call bell/phone within reach;with bed alarm set   Nurse Communication Mobility status        Time: 1610-9604 OT Time Calculation (min): 23 min  Charges: OT General Charges $OT Visit: 1 Visit OT Treatments $Self Care/Home Management : 8-22 mins  Jackquline Denmark, MS, OTR/L , CBIS ascom [phone]  02/12/22, 3:26 PM

## 2022-02-12 NOTE — Progress Notes (Signed)
Progress Note   Patient: Tiffany [name] CHY:[id number] DOB: Nov 15, 1961 DOA: 02/07/2022     4 DOS: the patient was seen and examined on 02/12/2022   Brief hospital course: Tiffany [name] is a 61 y.o. female with medical history significant for asthma, COPD, bipolar 1 disorder, GERD and migraine, who presented to the ER on 02/07/2022 with intractable nausea and vomiting for the last 8 days.  She denied having any diarrhea but reported lower abdominal discomfort more on the right than the left.  Pt had been seen in the ER couple of days prior for her nausea and vomiting were managed with antiemetics and hydration and therefore was thought to be stable for discharge given tolerating PO intake at that time.  In the ED this time, labs showed K 2.7, Mg 1.4.   CT abdomen/pelvis was non-acute but showed a dilated appendix without signs of appendicitis.  Admitted to medicine service with general surgery consulted to review appendix findings on imaging.    Patient on supportive care for likely gastroenteritis and electrolytes are being corrected.   Assessment and Plan: * Intractable nausea and vomiting- (present on admission) Present on admission, persistent for over a week per patient.  No abdominal pain other than abdominal wall muscles secondary to strain from vomiting. 2/13: N/V still persistent, noted patient not asking for as needed antiemetics, rarely given.  Patient noted no improvement with trial of meclizine yesterday, did develop diarrhea afterwards however -- IV fluids for hydration --Discontinue Reglan, low suspicion for motility issue --Will schedule Zofran and monitor for improvement --Additional antiemetics as needed --monitor and replace electrolytes -- Consult GI, appreciate input  Hyponatremia- (present on admission) Resolved with IV hydration.   Present on admission, mild with sodium 134.  Likely hypovolemic in the setting of nausea vomiting.  Remains on IV fluids due to N/V.   Monitored BMP  Hypomagnesemia- (present on admission) Present on admission with Mg 1.4. Has been recurrent.  2/13: Mg 1.6 again today, replacing with IV Monitor mag level and replace as needed  Hypokalemia- (present on admission) Present on admission has been persistent despite replacement, due to nausea vomiting and poor p.o. intake.    2/13: Resolved with K 3.8 today Monitor K and replace as needed  Pressure injury of skin- (present on admission) Present admission.  I agree with the wound description as outlined below. Frequent repositioning of patient. Local wound care and monitor closely  Pressure Injury 02/07/22 Buttocks Left Stage 2 -  Partial thickness loss of dermis presenting as a shallow open injury with a red, pink wound bed without slough. red and flaky (Active)  02/07/22 2238  Location: Buttocks  Location Orientation: Left  Staging: Stage 2 -  Partial thickness loss of dermis presenting as a shallow open injury with a red, pink wound bed without slough.  Wound Description (Comments): red and flaky  Present on Admission: Yes     Pressure Injury Buttocks Right Stage 2 -  Partial thickness loss of dermis presenting as a shallow open injury with a red, pink wound bed without slough. pink and flaky (Active)     Location: Buttocks  Location Orientation: Right  Staging: Stage 2 -  Partial thickness loss of dermis presenting as a shallow open injury with a red, pink wound bed without slough.  Wound Description (Comments): pink and flaky  Present on Admission: Yes        Hiatal hernia Noted in chart review, office visit note from 08/21/2021.  Appears was  referred to GI for further evaluation.    History of esophagitis Noted in chart review, PCP office note from 08/21/2021 and GI consult notes from admission here late October 2022.  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    Obesity, Class III, BMI 40-49.9 (morbid obesity) (HCC)- (present on admission) Body mass index is 42.68 kg/m. Complicates overall care and prognosis.  Recommend lifestyle modifications including physical activity and diet for weight loss and overall long-term health.   Dyslipidemia- (present on admission) Hold statin until tolerating PO intake better  History of migraine Continue as needed Imitrex  Asthma, chronic- (present on admission) Asthma/COPD is stable without exacerbation. -- Continue Spiriva -- Pulmicort nebs  Bipolar 1 disorder (HCC)- (present on admission) Appears stable.   Continue Lamictal and Cymbalta. Check Lamictal level  GERD (gastroesophageal reflux disease)- (present on admission) Continue PPI (IV for now with nausea vomiting ongoing).  Thrombocytopenia (HCC)- (present on admission) Platelets on admission 140>>120>>126.   Suspect at due to infection. SCDs for VTE prophylaxis. Monitor CBC. Avoid heparin products.        Subjective: Patient seen with fianc at bedside again today.  She was awake resting in bed.  Reports persistent nausea although less vomiting.  She did develop diarrhea starting yesterday afternoon apparently after being given meclizine.  She was not having diarrhea previously.  She denies any abdominal pain fevers or chills.  Physical Exam: Vitals:   02/11/22 1658 02/11/22 2010 02/12/22 0415 02/12/22 0744  BP: 107/66 123/66 115/64   Pulse: 94 97 88   Resp: 18 14 17    Temp: 98.4 F (36.9 C) 98.1 F (36.7 C) 98.5 F (36.9 C)   TempSrc: Oral Oral    SpO2: 94% 100% 97% 97%  Weight:      Height:       General exam: awake, alert, no acute distress Respiratory system: CTAB, no wheezes, rales or rhonchi, normal respiratory effort. Cardiovascular system: normal S1/S2, RRR Gastrointestinal system: soft, nontender and nondistended, hyperactive bowel sounds. Central nervous system: A&O x3, normal  speech Skin: dry, intact, normal temperature Psychiatry: normal mood, congruent affect, judgement and insight appear normal   Data Reviewed:  Labs reviewed and notable for normal sodium 135, normal potassium 3.8, magnesium 1.6  Family Communication: Fianc at bedside  Disposition: Status is: Inpatient Remains inpatient appropriate because: Severity of illness still not tolerating oral intake and requiring IV fluids and antiemetics      Planned Discharge Destination: Skilled nursing facility     Time spent: 35 minutes  Author: , DO 02/12/2022 1:04 PM  For on call review [email or web address].

## 2022-02-12 NOTE — Progress Notes (Signed)
Physical Therapy Treatment Patient Details Name: Tiffany [name] MRN: [id number] DOB: Jan 28, 1961 Today's Date: 02/12/2022   History of Present Illness 61 y.o. female with medical history significant for asthma, COPD, bipolar 1 disorder, GERD and migraine, who presented to the ER on 02/07/2022 with intractable nausea and vomiting for the last 8 days.    PT Comments    Patient received in bed. She is talkative and pleasant this session. Requires +2 mod assist for bed mobility and transfer supine to sit. She is fearful of feet touching floor when sitting. Patient was only able to sit briefly due to back discomfort. She performed bed level LE exercises with cues. Patient will continue to benefit from skilled PT while here to improve functional independence, strength and safety.      Recommendations for follow up therapy are one component of a multi-disciplinary discharge planning process, led by the attending physician.  Recommendations may be updated based on patient status, additional functional criteria and insurance authorization.  Follow Up Recommendations  Skilled nursing-short term rehab (<3 hours/day)     Assistance Recommended at Discharge Intermittent Supervision/Assistance  Patient can return home with the following Two people to help with walking and/or transfers;Two people to help with bathing/dressing/bathroom;Assistance with cooking/housework;Direct supervision/assist for medications management;Assist for transportation;Help with stairs or ramp for entrance;Direct supervision/assist for financial management   Equipment Recommendations  None recommended by PT    Recommendations for Other Services       Precautions / Restrictions Precautions Precautions: Fall Restrictions Weight Bearing Restrictions: No     Mobility  Bed Mobility Overal bed mobility: Needs Assistance Bed Mobility: Supine to Sit, Sit to Supine     Supine to sit: Mod assist, +2 for physical  assistance Sit to supine: Mod assist, +2 for physical assistance   General bed mobility comments: Pt does initiate some movement but needing increased assistance for safety    Transfers                        Ambulation/Gait                   Stairs             Wheelchair Mobility    Modified Rankin (Stroke Patients Only)       Balance Overall balance assessment: Needs assistance Sitting-balance support: Feet supported Sitting balance-Leahy Scale: Poor Sitting balance - Comments: min guard- mod A for static sitting balance. Patient fearful of falling if feet touch floor.                                    Cognition Arousal/Alertness: Awake/alert Behavior During Therapy: Flat affect Overall Cognitive Status: Within Functional Limits for tasks assessed                                 General Comments: Pt does communicate more during this session and talking about what is on TV. She does tell therapists goals for herself.        Exercises Other Exercises Other Exercises: Bed level exercises: AP, heel slides, hip abd/add, SLR x 5-10 reps as able    General Comments        Pertinent Vitals/Pain Pain Assessment Pain Assessment: Faces Faces Pain Scale: Hurts little more Pain Location: back Pain Descriptors / Indicators: Discomfort Pain  Intervention(s): Monitored during session, Repositioned    Home Living                          Prior Function            PT Goals (current goals can now be found in the care plan section) Acute Rehab PT Goals Patient Stated Goal: to stand again PT Goal Formulation: With patient Time For Goal Achievement: 02/23/22 Potential to Achieve Goals: Fair Progress towards PT goals: Progressing toward goals    Frequency    Min 2X/week      PT Plan Current plan remains appropriate    Co-evaluation PT/OT/SLP Co-Evaluation/Treatment: Yes Reason for Co-Treatment:  Necessary to address cognition/behavior during functional activity;To address functional/ADL transfers;For patient/therapist safety PT goals addressed during session: Mobility/safety with mobility OT goals addressed during session: ADL's and self-care      AM-PAC PT "6 Clicks" Mobility   Outcome Measure  Help needed turning from your back to your side while in a flat bed without using bedrails?: A Lot Help needed moving from lying on your back to sitting on the side of a flat bed without using bedrails?: A Lot Help needed moving to and from a bed to a chair (including a wheelchair)?: Total Help needed standing up from a chair using your arms (e.g., wheelchair or bedside chair)?: Total Help needed to walk in hospital room?: Total Help needed climbing 3-5 steps with a railing? : Total 6 Click Score: 8    End of Session Equipment Utilized During Treatment: Oxygen Activity Tolerance: Patient tolerated treatment well Patient left: in bed;with call bell/phone within reach;with bed alarm set Nurse Communication: Mobility status PT Visit Diagnosis: History of falling (Z91.81);Muscle weakness (generalized) (M62.81)     Time: 1093-2355 PT Time Calculation (min) (ACUTE ONLY): 10 min  Charges:  $Therapeutic Exercise: 8-22 mins                     [employer], PT, GCS 02/12/22,4:23 PM

## 2022-02-12 NOTE — H&P (View-Only) (Signed)
GI Inpatient Consult Note  Reason for Consult: Nausea/vomiting    Attending Requesting Consult: Dr. Nicole Kindred, DO  History of Present Illness: Tiffany [name] is a 61 y.o. female seen for evaluation of nausea/vomiting at the request of hospitalist - Dr. Nicole Kindred. Patient has a PMH of morbid obesity, asthma/COPD on 2L O2 via [place] chronically, migraines, GERD with esophagitis, and Bipolar I disorder. She presented to the [facility] ED 2/8 last week for chief complaint of 8-day history of intractable nausea and vomiting.  She actually presented to the [facility] ED 2/6 last week for same chief complaint where she was found to be hypokalemic (2.8), hypomagnesemic, and hypophosphatemic and received potassium supplementation, anti-emetics, and passed PO challenge and was discharged. She was unable to tolerate any PO intake at home and re-presented to the ED 2/8. Upon presentation to the ED, all vital signs were within normal limits. Labs were significant for hypokalemia (2.7), hyponatremia (134), hypocalcemia (7.9), hypomagnesemia (1.4), albumin 2.0. She had CT abd/pelvis with contrast performed wdhich showed no definitive acute findings with apparent prominence of appendix without surrounding inflammatory changes to suggest acute appendicitis. She had findings of moderate-sized hiatal hernia without bowel obstruction. General Surgery consult and signed off and clinical picture not s/w appendicitis. She was given 1L bolus of IV lactated ringers, IV potassium chloride, IV Phenergan, and magnesium sulfate. She continued with supportive care for likely gastroenteritis. GI consulted today for further evaluation and management of persistent nausea and vomiting.   Patient seen and examined this afternoon resting comfortably in hospital bed. No acute events overnight. Per nursing report, patient has been refusing PO meds due to nausea. She reportedly has been tolerating clears per nursing today. Patient reports she  is severely nauseous. Anytime she tries to eat something she will have to throw it up. Per Dr. Arbutus Ped, she has not been asking for her as needed anti-emetics so they have not been given. She reportedly had some diarrhea with Metoclopramide and Meclizine. She is frustrated her symptoms are not getting better. She denies any esophageal dysphagia, odynophagia, early satiety, or epigastric abdominal pain. She does endorse hx of heartburn and reflux and feels like these symptoms might be a little worse. She has had similar hospitalizations and ED visits previously for nausea and vomiting. She had EGD 07/2021 by Dr. Leafy Half at [facility] which showed LA Grade D esophagitis likely due to stasis, distal esophageal diverticulum, small paraesophageal hernia, evidence of prior fundoplication with intact wrap, normal stomach, and normal duodenum. She was advised to follow-up as outpatient to discuss endoscopic treatment of diverticulum versus surgical intervention. She has not followed up as an outpatient. She did have her PPI increased from once daily to twice daily.    Summary of Previous GI Procedures:  EGD 08/17/2021 Impression:             - Diverticulum in the distal esophagus. - LA Grade D esophagitis. Likely due to stasis. - Small paraesophageal hernia. - A fundoplication was found. The wrap appears intact. - Normal stomach. - Normal examined duodenum. - No specimens collected.   Past Medical History:  Past Medical History:  Diagnosis Date   Asthma    Bipolar 1 disorder ([place])    COPD (chronic obstructive pulmonary disease) ([place])    Enlarged heart    GERD (gastroesophageal reflux disease)    Migraines     Problem List: Patient Active Problem List   Diagnosis Date Noted   History of esophagitis 02/12/2022  Hiatal hernia 02/12/2022   Obesity, Class III, BMI 40-49.9 (morbid obesity) ([place]) 02/11/2022   Pressure injury of skin 02/08/2022   Hypomagnesemia 02/08/2022   Hyponatremia 02/08/2022    Bipolar 1 disorder ([place]) 02/08/2022   Asthma, chronic 02/08/2022   History of migraine 02/08/2022   Dyslipidemia 02/08/2022   Intractable nausea and vomiting 02/07/2022   Hypoxia    Acute respiratory failure with hypoxia and hypercarbia [facility])    Ambulatory dysfunction    Primary hypertension    Encephalopathy 11/13/2021   Hypokalemia 10/29/2021   Frequent falls 10/29/2021   Refractory nausea and vomiting 10/29/2021   Thrombocytopenia ([place]) 10/29/2021   GERD (gastroesophageal reflux disease) 10/29/2021    Past Surgical History: Past Surgical History:  Procedure Laterality Date   ABDOMINAL HYSTERECTOMY     ABDOMINAL SURGERY     "For acid reflux"   CESAREAN SECTION      Allergies: Allergies  Allergen Reactions   Dairy Aid [Tilactase] Other (See Comments)    Exacerbates asthma   Egg [Eggs Or Egg-Derived Products] Other (See Comments)    Exacerbated asthma   Milk-Related Compounds     Home Medications: Medications Prior to Admission  Medication Sig Dispense Refill Last Dose   acetaminophen (TYLENOL) 325 MG tablet Take 2 tablets (650 mg total) by mouth every 6 (six) hours as needed for mild pain (or Fever >/= 101).      atorvastatin (LIPITOR) 80 MG tablet Take 80 mg by mouth daily.      diclofenac Sodium (VOLTAREN) 1 % GEL Apply 4 g topically 4 (four) times daily.      DULoxetine (CYMBALTA) 30 MG capsule Take 30 mg by mouth daily.      famotidine (PEPCID) 20 MG tablet Take 1 tablet (20 mg total) by mouth 2 (two) times daily. 60 tablet 0    FLOVENT HFA 110 MCG/ACT inhaler Inhale 2 puffs into the lungs 2 (two) times daily.      folic acid (FOLVITE) 1 MG tablet Take 5 tablets (5 mg total) by mouth daily.      lamoTRIgine (LAMICTAL) 25 MG tablet Take 50 mg by mouth daily.      loratadine (CLARITIN) 10 MG tablet Take 1 tablet (10 mg total) by mouth every evening.      metoCLOPramide (REGLAN) 10 MG tablet Take 1 tablet (10 mg total) by mouth 3 (three) times daily before meals.       Multiple Vitamin (MULTIVITAMIN WITH MINERALS) TABS tablet Take 1 tablet by mouth daily.      Nutritional Supplements (FEEDING SUPPLEMENT, KATE FARMS STANDARD 1.4,) LIQD liquid Take 325 mLs by mouth 3 (three) times daily between meals.      ondansetron (ZOFRAN-ODT) 4 MG disintegrating tablet Take 1 tablet (4 mg total) by mouth every 8 (eight) hours as needed for nausea or vomiting. 20 tablet 0    pantoprazole (PROTONIX) 40 MG tablet Take 40 mg by mouth 2 (two) times daily.      polyethylene glycol (MIRALAX / GLYCOLAX) 17 g packet Take 17 g by mouth 2 (two) times daily. 14 each 0    SPIRIVA HANDIHALER 18 MCG inhalation capsule 1 capsule at bedtime.      SUMAtriptan (IMITREX) 100 MG tablet Take 100 mg by mouth daily as needed.      traZODone (DESYREL) 100 MG tablet Take 1 tablet (100 mg total) by mouth at bedtime as needed for sleep.      Vitamin D, Ergocalciferol, (DRISDOL) 1.25 MG (50000 UNIT) CAPS capsule  Take 1 capsule (50,000 Units total) by mouth every 7 (seven) days. 5 capsule     Home medication reconciliation was completed with the patient.   Scheduled Inpatient Medications:    budesonide  0.25 mg Nebulization BID   DULoxetine  30 mg Oral Daily   lamoTRIgine  50 mg Oral Daily   mouth rinse  15 mL Mouth Rinse BID   ondansetron  4 mg Oral Q6H   Or   ondansetron (ZOFRAN) IV  4 mg Intravenous Q6H   pantoprazole (PROTONIX) IV  40 mg Intravenous Q12H   polyethylene glycol  17 g Oral BID   tiotropium  1 capsule Inhalation Daily    Continuous Inpatient Infusions:    dextrose 5% lactated ringers with KCl 20 mEq/L 75 mL/hr at 02/11/22 0434    PRN Inpatient Medications:  acetaminophen **OR** acetaminophen, magnesium hydroxide, prochlorperazine, SUMAtriptan, traZODone  Family History: family history includes Hypertension in her mother.  The patient's family history is negative for inflammatory bowel disorders, GI malignancy, or solid organ transplantation.  Social History:    reports that she has never smoked. She has never used smokeless tobacco. She reports that she does not drink alcohol and does not use drugs. The patient denies ETOH, tobacco, or drug use.   Review of Systems: Constitutional: Weight is stable.  Eyes: No changes in vision. ENT: No oral lesions, sore throat.  GI: see HPI.  Heme/Lymph: No easy bruising.  CV: No chest pain.  GU: No hematuria.  Integumentary: No rashes.  Neuro: No headaches.  Psych: No depression/anxiety.  Endocrine: No heat/cold intolerance.  Allergic/Immunologic: No urticaria.  Resp: No cough, SOB.  Musculoskeletal: No joint swelling.    Physical Examination: BP 115/64    Pulse 88    Temp 98.5 F (36.9 C)    Resp 17    Ht 5\' 9"  (1.753 m)    Wt 131.1 kg    SpO2 97%    BMI 42.68 kg/m  Non-toxic appearing female in bed. No accessory muscle use.  Gen: NAD, alert and oriented x 4 HEENT: PEERLA, EOMI, Neck: supple, no JVD or thyromegaly Chest: CTA bilaterally, no wheezes, crackles, or other adventitious sounds CV: RRR, no m/g/c/r Abd: soft, NT, ND, +BS in all four quadrants; no HSM, guarding, ridigity, or rebound tenderness Ext: no edema, well perfused with 2+ pulses, Skin: no rash or lesions noted Lymph: no LAD  Data: Lab Results  Component Value Date   WBC 3.7 (L) 02/09/2022   HGB 9.1 (L) 02/09/2022   HCT 27.8 (L) 02/09/2022   MCV 85.8 02/09/2022   PLT 126 (L) 02/09/2022   Recent Labs  Lab 02/07/22 1222 02/08/22 0449 02/09/22 0531  HGB 9.9* 9.1* 9.1*   Lab Results  Component Value Date   NA 135 02/12/2022   K 3.8 02/12/2022   CL 99 02/12/2022   CO2 29 02/12/2022   BUN <5 (L) 02/12/2022   CREATININE 0.50 02/12/2022   Lab Results  Component Value Date   ALT 11 02/07/2022   AST 19 02/07/2022   ALKPHOS 44 02/07/2022   BILITOT 0.9 02/07/2022   No results for input(s): APTT, INR, PTT in the last 168 hours.  CT abd/pelvis with contrast 02/07/22: IMPRESSION: 1. No definite acute findings. Apparent  prominence of the appendix without surrounding inflammatory changes to suggest acute appendicitis in this patient without leukocytosis. Recommend clinical follow up. 2. Moderate size hiatal hernia.  No evidence of bowel obstruction. 3. Previous hysterectomy.  Assessment/Plan:  61 y/o  AA female with a PMH of Bipolar I disorder, asthma/COPD on 2L O2 via [place] chronically, GERD with esophagitis, morbid obesity, GERD, and Hx of migraines presented to the [facility] ED 2/8 for chief complaint of intractable nausea and vomiting. She has failed to respond to supportive care with antiemetics and IV fluid hydration. GI consulted today (2/13) for persistent nausea and vomiting.   Persistent nausea and vomiting - DDx includes erosive esophagitis, peptic ulcer disease, gastritis, partial gastric outlet obstruction, complex paraesophageal hernia, esophageal dysmotility, gastroparesis, medication side effects, gastroenteritis, etc  GERD with hiatal hernia - EGD 07/2021 above with findings of LA Grade D erosive esophagitis, small paraesophageal hernia, and distal esophageal diverticulum. Likely main contributing factor to ongoing issues with N/V.   Esophageal diverticulum  Electrolyte derangement  Asthma/COPD on 2L O2 chronically   Thrombocytopenia  Morbid obesity - BMI 42.68  Recommendations:  - Continue supportive care with scheduled antiemetics and IV fluid hydration - Continue Protonix IV BID for gastric protection - D/C Reglan since this can worsen diarrhea - Avoid anticholinergic medications  - Advise EGD for luminal evaluation. Since patient has eaten today, EGD will need to be performed tomorrow morning.  - Continue monitoring of electrolytes and replete as necessary  - Plan for EGD tomorrow with Dr. Alice Reichert - Continue clears for now. NPO after midnight.  - See procedure note for findings and further recommendations - Will benefit from follow-up at [facility] or other tertiary center to discuss treatment  of esophageal diverticulum + paraesophageal hernia  I reviewed the risks (including bleeding, perforation, infection, anesthesia complications, cardiac/respiratory complications), benefits and alternatives of EGD. Patient consents to proceed.    Thank you for the consult. Please call with questions or concerns.  [facility] For[facility] [phone] (Cell)

## 2022-02-13 ENCOUNTER — Encounter: Payer: Self-pay | Admitting: Internal Medicine

## 2022-02-13 ENCOUNTER — Inpatient Hospital Stay: Payer: Medicaid Other | Admitting: Anesthesiology

## 2022-02-13 ENCOUNTER — Encounter
Admission: EM | Disposition: A | Discharge status: Home / Self Care | Payer: Self-pay | Source: Home / Self Care | Attending: Internal Medicine

## 2022-02-13 DIAGNOSIS — R638 Other symptoms and signs concerning food and fluid intake: Secondary | ICD-10-CM | POA: Clinically undetermined

## 2022-02-13 HISTORY — PX: ESOPHAGOGASTRODUODENOSCOPY: SHX5428

## 2022-02-13 LAB — BASIC METABOLIC PANEL WITH GFR
Anion gap: 2 — ABNORMAL LOW (ref 5–15)
BUN: <5 mg/dL — ABNORMAL LOW (ref 6–20)
CO2: 30 mmol/L (ref 22–32)
Calcium: 8 mg/dL — ABNORMAL LOW (ref 8.9–10.3)
Chloride: 103 mmol/L (ref 98–111)
Creatinine, Ser: 0.41 mg/dL — ABNORMAL LOW (ref 0.44–1.00)
GFR, Estimated: >60 mL/min
Glucose, Bld: 94 mg/dL (ref 70–99)
Potassium: 4.2 mmol/L (ref 3.5–5.1)
Sodium: 135 mmol/L (ref 135–145)

## 2022-02-13 LAB — CBC
HCT: 28.5 % — ABNORMAL LOW (ref 36.0–46.0)
Hemoglobin: 9.3 g/dL — ABNORMAL LOW (ref 12.0–15.0)
MCH: 28.4 pg (ref 26.0–34.0)
MCHC: 32.6 g/dL (ref 30.0–36.0)
MCV: 87.2 fL (ref 80.0–100.0)
Platelets: 187 K/uL (ref 150–400)
RBC: 3.27 MIL/uL — ABNORMAL LOW (ref 3.87–5.11)
RDW: 15.2 % (ref 11.5–15.5)
WBC: 4.7 K/uL (ref 4.0–10.5)
nRBC: 0 % (ref 0.0–0.2)

## 2022-02-13 LAB — MAGNESIUM: Magnesium: 1.6 mg/dL — ABNORMAL LOW (ref 1.7–2.4)

## 2022-02-13 SURGERY — EGD (ESOPHAGOGASTRODUODENOSCOPY)
Anesthesia: General

## 2022-02-13 MED ORDER — FENTANYL CITRATE (PF) 100 MCG/2ML IJ SOLN
INTRAMUSCULAR | Status: AC
  Filled 2022-02-13: qty 2

## 2022-02-13 MED ORDER — PROPOFOL 10 MG/ML IV BOLUS
INTRAVENOUS | Status: DC | PRN
  Administered 2022-02-13: 100 mg via INTRAVENOUS

## 2022-02-13 MED ORDER — MAGNESIUM SULFATE 4 GM/100ML IV SOLN
4.0000 g | Freq: Once | INTRAVENOUS | Status: AC
  Administered 2022-02-13: 4 g via INTRAVENOUS
  Filled 2022-02-13: qty 100

## 2022-02-13 MED ORDER — SUCCINYLCHOLINE CHLORIDE 200 MG/10ML IV SOSY
PREFILLED_SYRINGE | INTRAVENOUS | Status: DC | PRN
  Administered 2022-02-13: 140 mg via INTRAVENOUS

## 2022-02-13 MED ORDER — ONDANSETRON HCL 4 MG/2ML IJ SOLN
INTRAMUSCULAR | Status: DC | PRN
  Administered 2022-02-13: 4 mg via INTRAVENOUS

## 2022-02-13 MED ORDER — DEXAMETHASONE SODIUM PHOSPHATE 10 MG/ML IJ SOLN
INTRAMUSCULAR | Status: DC | PRN
  Administered 2022-02-13: 8 mg via INTRAVENOUS

## 2022-02-13 MED ORDER — PROPOFOL 500 MG/50ML IV EMUL
INTRAVENOUS | Status: AC
  Filled 2022-02-13: qty 50

## 2022-02-13 MED ORDER — LIDOCAINE HCL (CARDIAC) PF 100 MG/5ML IV SOSY
PREFILLED_SYRINGE | INTRAVENOUS | Status: DC | PRN
Start: 2022-02-13 — End: 2022-02-13
  Administered 2022-02-13: 100 mg via INTRAVENOUS

## 2022-02-13 MED ORDER — PROMETHAZINE HCL 25 MG/ML IJ SOLN: 6.2500 mg | INTRAMUSCULAR | Status: DC | PRN

## 2022-02-13 MED ORDER — SODIUM CHLORIDE 0.9 % IV SOLN: INTRAVENOUS | Status: DC

## 2022-02-13 MED ORDER — PROPOFOL 500 MG/50ML IV EMUL
INTRAVENOUS | Status: DC | PRN
  Administered 2022-02-13: 100 ug/kg/min via INTRAVENOUS

## 2022-02-13 MED ORDER — FENTANYL CITRATE (PF) 100 MCG/2ML IJ SOLN
INTRAMUSCULAR | Status: DC | PRN
  Administered 2022-02-13 (×2): 50 ug via INTRAVENOUS

## 2022-02-13 NOTE — Interval H&P Note (Signed)
History and Physical Interval Note:  02/13/2022 1:36 PM  Tiffany [name]  has presented today for surgery, with the diagnosis of Intractable nausea and vomiting, GERD with hiatal hernia, esophageal diverticulum.  The various methods of treatment have been discussed with the patient and family. After consideration of risks, benefits and other options for treatment, the patient has consented to  Procedure(s): ESOPHAGOGASTRODUODENOSCOPY (EGD) (N/A) as a surgical intervention.  The patient's history has been reviewed, patient examined, no change in status, stable for surgery.  I have reviewed the patient's chart and labs.  Questions were answered to the patient's satisfaction.     [place], [place]

## 2022-02-13 NOTE — Progress Notes (Signed)
Patient able to swallow without adverse reaction.

## 2022-02-13 NOTE — Op Note (Signed)
[facility] Patient Name: Tiffany [name] Procedure Date: 02/13/2022 11:41 AM MRN: [id number] Account #: [id number] Date of Birth: [birth year] Admit Type: Outpatient Age: 61 Room: [facility] ENDO ROOM 4 Gender: Female Note Status: Finalized Instrument Name: Upper Endoscope [id number] Procedure:             Upper GI endoscopy Indications:           Epigastric abdominal pain, Persistent vomiting Providers:             Benay Pike. Tiersa Dayley MD, MD Medicines:             Propofol per Anesthesia Complications:         No immediate complications. Procedure:             Pre-Anesthesia Assessment:                        - The risks and benefits of the procedure and the                         sedation options and risks were discussed with the                         patient. All questions were answered and informed                         consent was obtained.                        - Patient identification and proposed procedure were                         verified prior to the procedure by the nurse. The                         procedure was verified in the procedure room.                        - ASA Grade Assessment: III - A patient with severe                         systemic disease.                        - After reviewing the risks and benefits, the patient                         was deemed in satisfactory condition to undergo the                         procedure.                        After obtaining informed consent, the endoscope was                         passed under direct vision. Throughout the procedure,                         the patient's blood pressure, pulse, and oxygen  saturations were monitored continuously. The Endoscope                         was introduced through the mouth, and advanced to the                         third part of duodenum. The upper GI endoscopy was                         accomplished without  difficulty. The patient tolerated                         the procedure well. Findings:      A non-bleeding diverticulum with a small opening and no stigmata of       recent bleeding was found in the distal esophagus.      A small area of extrinsic compression was found in the distal esophagus.      Evidence of a Nissen fundoplication was found in the cardia. The wrap       appeared intact. This was traversed.      Patchy mild inflammation characterized by erythema was found in the       gastric antrum. Biopsies were taken with a cold forceps for Helicobacter       pylori testing.      The examined duodenum was normal.      The exam was otherwise without abnormality. Impression:            - Diverticulum in the distal esophagus.                        - Extrinsic compression in the distal esophagus.                        - A Nissen fundoplication was found. The wrap appears                         intact.                        - Gastritis. Biopsied.                        - Normal examined duodenum.                        - The examination was otherwise normal. Recommendation:        - Return patient to hospital ward for ongoing care.                        - Clear liquid diet.                        - Advance diet as tolerated.                        - Continue present medications.                        - Await pathology results. Procedure Code(s):     --- Professional ---  [id number], Esophagogastroduodenoscopy, flexible,                         transoral; with biopsy, single or multiple Diagnosis Code(s):     --- Professional ---                        0000000, Cyclical vomiting syndrome unrelated to                         migraine                        R10.13, Epigastric pain                        K29.70, Gastritis, unspecified, without bleeding                        Z98.890, Other specified postprocedural states                        K22.2, Esophageal  obstruction                        Q39.6, Congenital diverticulum of esophagus CPT copyright 2019 American Medical Association. All rights reserved. The codes documented in this report are preliminary and upon coder review may  be revised to meet current compliance requirements. Efrain Sella MD, MD 02/13/2022 1:26:19 PM This report has been signed electronically. Number of Addenda: 0 Note Initiated On: 02/13/2022 11:41 AM Estimated Blood Loss:  Estimated blood loss: none.      [facility]

## 2022-02-13 NOTE — Progress Notes (Signed)
Initial Nutrition Assessment  DOCUMENTATION CODES:   Obesity unspecified  INTERVENTION:   [employer] 1.0 po TID with diet advancement, each supplement provides 325 kcal and 16 grams protein.  MVI po daily   Vitamin C 547m po BID  Pt at high refeed risk; recommend monitor potassium, magnesium and phosphorus labs daily until stable  NUTRITION DIAGNOSIS:   Inadequate oral intake related to acute illness as evidenced by other (comment) (pt on NPO/clear liquid diet since admission).  GOAL:   Patient will meet greater than or equal to 90% of their needs  MONITOR:   PO intake, Supplement acceptance, Labs, Weight trends, Skin, I & O's  REASON FOR ASSESSMENT:   NPO/Clear Liquid Diet    ASSESSMENT:   61y/o female with h/o bipolar disorder, COPD, GERD, hiatal hernia and s/p nissen fundoplication (942'A who is admitted with intractable nausea and vomiting.  Pt s/p EGD today; pt found to have esophageal diverticulum, small area of extrinsic compression in distal esophagus, erythema in gastric antrum  Met with pt and pt's friend in room today. Pt sleeping after procedure today so history obtained from pt's friend at bedside. Per pt's friend at bedside, pt with continued poor appetite and oral intake at home r/t nausea and vomiting. Pt has not been drinking any supplements as she reports an allergy to milk. Pt was drinking [employer] 1.0 during her last admission; this is a vegetarian supplement. Pt has been on NPO/clear liquid diet since admission. Pt initiated on clear liquid diet today. RD placed pt's dinner order today. Of note, pt only drinks chicken broth, does not like yellow jello, juice or mango italian ice. RD will add KDillard Essexsupplement once pt's diet advances. Pt is at high refeed risk. RD will add vitamins to support wound healing. Per chart, pt is down 30lbs(11%) over the past 3 months; this is significant weight loss. Pt is at high risk for malnutrition. Plan is for SNF at  discharge.   Medications reviewed and include: zofran, protonix, miralax, LRS w/ 5% dextrose _0 /hr   Labs reviewed: K 4.2 wnl, BUN <5(L), creat 0.41(L), Mg 1.6(L) Hgb 9.3(L), Hct 28.5(L)  NUTRITION - FOCUSED PHYSICAL EXAM:  Flowsheet Row Most Recent Value  Orbital Region No depletion  Upper Arm Region No depletion  Thoracic and Lumbar Region No depletion  Buccal Region No depletion  Temple Region No depletion  Clavicle Bone Region No depletion  Clavicle and Acromion Bone Region No depletion  Scapular Bone Region No depletion  Dorsal Hand No depletion  Patellar Region No depletion  Anterior Thigh Region No depletion  Posterior Calf Region No depletion  Edema (RD Assessment) Mild  Hair Reviewed  Eyes Reviewed  Mouth Reviewed  Skin Reviewed  Nails Reviewed   Diet Order:   Diet Order             Diet clear liquid Room service appropriate? Yes; Fluid consistency: Thin  Diet effective now                  EDUCATION NEEDS:   Education needs have been addressed  Skin:  Skin Assessment: Reviewed RN Assessment (Stage II left and right buttocks)  Last BM:  2/13- type 6  Height:   Ht Readings from Last 1 Encounters:  02/07/22 5' 9" (1.753 m)    Weight:   Wt Readings from Last 1 Encounters:  02/13/22 117.3 kg    Ideal Body Weight:  65.9 kg  BMI:  Body mass index  is 38.19 kg/m.  Estimated Nutritional Needs:   Kcal:  2200-2500kcal/day  Protein:  110-125g/day  Fluid:  2.0-2.3L/day  Koleen Distance MS, RD, LDN Please refer to [facility] for RD and/or RD on-call/weekend/after hours pager

## 2022-02-13 NOTE — Progress Notes (Signed)
Progress Note   Patient: Tiffany [name]:[id number] DOB: 07-Oct-1961 DOA: 02/07/2022     5 DOS: the patient was seen and examined on 02/13/2022   Brief hospital course: Tiffany [name] is a 61 y.o. female with medical history significant for asthma, COPD, bipolar 1 disorder, GERD and migraine, who presented to the ER on 02/07/2022 with intractable nausea and vomiting for the last 8 days.  She denied having any diarrhea but reported lower abdominal discomfort more on the right than the left.  Pt had been seen in the ER couple of days prior for her nausea and vomiting were managed with antiemetics and hydration and therefore was thought to be stable for discharge given tolerating PO intake at that time.  In the ED this time, labs showed K 2.7, Mg 1.4.   CT abdomen/pelvis was non-acute but showed a dilated appendix without signs of appendicitis.  Admitted to medicine service with general surgery consulted to review appendix findings on imaging.    Patient on supportive care for likely gastroenteritis and electrolytes are being corrected.   Assessment and Plan: * Intractable nausea and vomiting- (present on admission) Present on admission, persistent for over a week per patient.  No abdominal pain other than abdominal wall muscles secondary to strain from vomiting. 2/14: N/V still persistent, going for EGD today -- IV fluids for hydration --Reglan stopped, was unhelpful and pt developed diarrhea, very low suspicion for motility issue --Sheduled Zofran  --Additional PRN antiemetics --monitor and replace electrolytes -- Consult GI, appreciate input  Hyponatremia- (present on admission) Resolved with IV hydration.   Present on admission, mild with sodium 134.  Likely hypovolemic in the setting of nausea vomiting.  Remains on IV fluids due to N/V.  Monitored BMP  Hypomagnesemia- (present on admission) Present on admission with Mg 1.4. Has been recurrent in setting of persistent N/V.   2/14: Mg 1.6 again today, replacing with IV Monitor mag level and replace as needed  Hypokalemia- (present on admission) Present on admission has been persistent despite replacement, due to nausea vomiting and poor p.o. intake.  On IV fluids with K additive.  Monitor and continue replacement PRN.  Pressure injury of skin- (present on admission) Present admission.  I agree with the wound description as outlined below. Frequent repositioning of patient. Local wound care and monitor closely  Pressure Injury 02/07/22 Buttocks Left Stage 2 -  Partial thickness loss of dermis presenting as a shallow open injury with a red, pink wound bed without slough. red and flaky (Active)  02/07/22 2238  Location: Buttocks  Location Orientation: Left  Staging: Stage 2 -  Partial thickness loss of dermis presenting as a shallow open injury with a red, pink wound bed without slough.  Wound Description (Comments): red and flaky  Present on Admission: Yes     Pressure Injury Buttocks Right Stage 2 -  Partial thickness loss of dermis presenting as a shallow open injury with a red, pink wound bed without slough. pink and flaky (Active)     Location: Buttocks  Location Orientation: Right  Staging: Stage 2 -  Partial thickness loss of dermis presenting as a shallow open injury with a red, pink wound bed without slough.  Wound Description (Comments): pink and flaky  Present on Admission: Yes        Inadequate oral intake Due to persistent N/V.  Dietician following, appreciate recommendations: "Molli Posey 1.0 po TID with diet advancement, each supplement provides 325 kcal and 16 grams protein.  MVI po daily   Vitamin C 500mg  po BID  Pt at high refeed risk; recommend monitor potassium, magnesium and phosphorus labs daily until stable"  Hiatal hernia Noted in chart review, office visit note from 08/21/2021.  Appears was referred to GI for further evaluation.    History of esophagitis Noted in chart  review, PCP office note from 08/21/2021 and GI consult notes from admission here late October 2022.  Appears EGD at that time showed grade D esophagitis with an esophageal diverticulum and paraesophageal hernia with history of Nissen fundoplication with intact wrap.    Obesity, Class III, BMI 40-49.9 (morbid obesity) (HCC)- (present on admission) Body mass index is 42.68 kg/m. Complicates overall care and prognosis.  Recommend lifestyle modifications including physical activity and diet for weight loss and overall long-term health.   Dyslipidemia- (present on admission) Hold statin until tolerating PO intake better  History of migraine Continue as needed Imitrex  Asthma, chronic- (present on admission) Asthma/COPD is stable without exacerbation. -- Continue Spiriva -- Pulmicort nebs  Bipolar 1 disorder (HCC)- (present on admission) Appears stable.   Continue Lamictal and Cymbalta. Check Lamictal level  GERD (gastroesophageal reflux disease)- (present on admission) Continue PPI (IV for now with nausea vomiting ongoing).  Thrombocytopenia (HCC)- (present on admission) Platelets on admission 140>>120>>126.   Suspect at due to infection. SCDs for VTE prophylaxis. Monitor CBC. Avoid heparin products.        Subjective: Pt awake laying in bed. She reports persistent N/V, no tolerance for PO intake at all.  Asks what time going for EGD.  No abdominal pain, only nausea.  Last vomited earlier this AM.   Physical Exam: Vitals:   02/13/22 1345 02/13/22 1400 02/13/22 1425 02/13/22 1610  BP: 133/85 133/81 136/73   Pulse: (!) 105 (!) 101 89   Resp: 12 18 20    Temp: 98 F (36.7 C)  98.2 F (36.8 C)   TempSrc:      SpO2: 100% 99% 99%   Weight:    117.3 kg  Height:       General exam: awake, alert, no acute distress, obese Respiratory system: CTAB, no wheezes, rales or rhonchi, normal respiratory effort. Cardiovascular system: normal S1/S2, RRR.   Gastrointestinal system:  soft, NT, ND, no HSM felt, +bowel sounds. Central nervous system: A&O x3. normal speech Skin: dry, intact, normal temperature Psychiatry: normal mood, flat affect   Data Reviewed:  Labs notable for Mg 1.6, Hbg 9.3, Cr 0.41, BUN <5, anion gap 2, normal sodium and potassium  Family Communication: Fiance not at bedside today, has been at bedside past few days on rounds.  Disposition: Status is: Inpatient Remains inpatient appropriate because: Ongoing evaluation of N/V, inadequate PO intake          Planned Discharge Destination: Skilled nursing facility     Time spent: 35 minutes  Author: 02/15/22, DO 02/13/2022 5:25 PM  For on call review [email or web address].

## 2022-02-13 NOTE — Anesthesia Postprocedure Evaluation (Signed)
Anesthesia Post Note  Patient: Tiffany [name]  Procedure(s) Performed: ESOPHAGOGASTRODUODENOSCOPY (EGD)  Patient location during evaluation: PACU Anesthesia Type: General Level of consciousness: awake and alert Pain management: pain level controlled Vital Signs Assessment: post-procedure vital signs reviewed and stable Respiratory status: spontaneous breathing, nonlabored ventilation, respiratory function stable and patient connected to nasal cannula oxygen Cardiovascular status: blood pressure returned to baseline and stable Postop Assessment: no apparent nausea or vomiting Anesthetic complications: no   No notable events documented.   Last Vitals:  Vitals:   02/13/22 1400 02/13/22 1425  BP: 133/81 136/73  Pulse: (!) 101 89  Resp: 18 20  Temp:  36.8 C  SpO2: 99% 99%    Last Pain:  Vitals:   02/13/22 1400  TempSrc:   PainSc: 0-No pain                 Corinda Gubler

## 2022-02-13 NOTE — Transfer of Care (Signed)
Immediate Anesthesia Transfer of Care Note  Patient: Tiffany [name]  Procedure(s) Performed: ESOPHAGOGASTRODUODENOSCOPY (EGD)  Patient Location: PACU  Anesthesia Type:General  Level of Consciousness: drowsy and patient cooperative  Airway & Oxygen Therapy: Patient Spontanous Breathing and Patient connected to face mask oxygen  Post-op Assessment: Report given to RN and Post -op Vital signs reviewed and stable  Post vital signs: Reviewed and stable  Last Vitals:  Vitals Value Taken Time  BP 152/83 02/13/22 1333  Temp    Pulse 111 02/13/22 1340  Resp 19 02/13/22 1340  SpO2 95 % 02/13/22 1340  Vitals shown include unvalidated device data.  Last Pain:  Vitals:   02/13/22 1244  TempSrc: Oral  PainSc:       Patients Stated Pain Goal: 0 ([id number])  Complications: No notable events documented.

## 2022-02-13 NOTE — Assessment & Plan Note (Addendum)
--  supplements per dietician  

## 2022-02-13 NOTE — TOC Progression Note (Signed)
Transition of Care [facility]) - Progression Note    Patient Details  Name: DELESA KAWA MRN: [id number] Date of Birth: 1961/12/31  Transition of [facility]) CM/SW Contact  Margarito Liner, LCSW Phone Number: 02/13/2022, 3:01 PM  Clinical Narrative:   Provided bed offers with CMS scores to patient and fiance at bedside. Updated DSS Child psychotherapist.  Expected Discharge Plan: Skilled Nursing Facility Barriers to Discharge: Continued Medical Work up, SNF Pending bed offer  Expected Discharge Plan and Services Expected Discharge Plan: Skilled Nursing Facility     Post Acute Care Choice: Skilled Nursing Facility Living arrangements for the past 2 months: Hotel/Motel                                       Social Determinants of Health (SDOH) Interventions    Readmission Risk Interventions Readmission Risk Prevention Plan 11/08/2021  Transportation Screening Complete  PCP or Specialist Appt within 5-7 Days Complete  Home Care Screening Complete  Medication Review (RN CM) Complete  Some recent data might be hidden

## 2022-02-13 NOTE — Progress Notes (Signed)
OT Cancellation Note  Patient Details Name: Tiffany [name] MRN: [id number] DOB: [birth year]   Cancelled Treatment:    Reason Eval/Treat Not Completed: Patient at procedure or test/ unavailable (Pt. is having an espophagogastrodenoscopy procedure. Will reattmept OT treatment at a later time, or date.)  Olegario Messier, MS, OTR/L  Olegario Messier 02/13/2022, 2:03 PM

## 2022-02-13 NOTE — Anesthesia Procedure Notes (Signed)
Procedure Name: Intubation Date/Time: 02/13/2022 1:10 PM Performed by: Lynden Oxford, CRNA Pre-anesthesia Checklist: Patient identified, Emergency Drugs available, Suction available and Patient being monitored Patient Re-evaluated:Patient Re-evaluated prior to induction Oxygen Delivery Method: Circle system utilized Preoxygenation: Pre-oxygenation with 100% oxygen Induction Type: IV induction and Rapid sequence Ventilation: Mask ventilation without difficulty Laryngoscope Size: McGraph and 4 Grade View: Grade I Tube type: Oral Tube size: 7.0 mm Number of attempts: 1 Airway Equipment and Method: Stylet and Video-laryngoscopy Placement Confirmation: ETT inserted through vocal cords under direct vision, positive ETCO2 and breath sounds checked- equal and bilateral Secured at: 22 cm Tube secured with: Tape Dental Injury: Teeth and Oropharynx as per pre-operative assessment  Difficulty Due To: Difficulty was anticipated, Difficult Airway- due to large tongue and Difficult Airway- due to limited oral opening Future Recommendations: Recommend- induction with short-acting agent, and alternative techniques readily available

## 2022-02-13 NOTE — Progress Notes (Signed)
Physical Therapy Treatment Patient Details Name: Tiffany [name] MRN: [id number] DOB: 1961/11/13 Today's Date: 02/13/2022   History of Present Illness 61 y.o. female with medical history significant for asthma, COPD, bipolar 1 disorder, GERD and migraine, who presented to the ER on 02/07/2022 with intractable nausea and vomiting for the last 8 days.    PT Comments    Patient tolerated session well and was agreeable to treatment. Upon arrival patient was supine in bed resting. Aroused to tactile cueing on the arm. Required +2 assistance (RN) for L bed mobility rolling at the hips. Patient is able to initiate cross body reach to bed rail, however needed max assist to complete movement. Patient encouraged to sit EOB, however continues to be highly fearful and refused. She performed bed level therapeutic exercises with tactile and visual cueing for BLE strengthening. Patient would continue to benefit from skilled physical therapy in order to improve functional independence, strength, and safety. Continue to recommend STR upon discharge from acute hospitalization.    Recommendations for follow up therapy are one component of a multi-disciplinary discharge planning process, led by the attending physician.  Recommendations may be updated based on patient status, additional functional criteria and insurance authorization.  Follow Up Recommendations  Skilled nursing-short term rehab (<3 hours/day)     Assistance Recommended at Discharge Intermittent Supervision/Assistance  Patient can return home with the following Two people to help with walking and/or transfers;Two people to help with bathing/dressing/bathroom;Assistance with cooking/housework;Direct supervision/assist for medications management;Assist for transportation;Help with stairs or ramp for entrance;Direct supervision/assist for financial management   Equipment Recommendations  None recommended by PT    Recommendations for Other Services        Precautions / Restrictions Precautions Precautions: Fall Restrictions Weight Bearing Restrictions: No     Mobility  Bed Mobility Overal bed mobility: Needs Assistance Bed Mobility: Rolling Rolling: Max assist (x2 (RN assist) to left to place pillow under hip for pressure relief, patient able to reach across body to grab bed rail, however requires max assist at the hips to complete movement)              Transfers                   General transfer comment: not attempted - has not performed since 11/22    Ambulation/Gait                   Stairs             Wheelchair Mobility    Modified Rankin (Stroke Patients Only)       Balance               Standing balance comment: deferred                            Cognition Arousal/Alertness: Awake/alert Behavior During Therapy: Flat affect Overall Cognitive Status: Within Functional Limits for tasks assessed                                 General Comments: Pt did not communicate as much this session, very quiet, A&Ox3        Exercises General Exercises - Lower Extremity Ankle Circles/Pumps: Supine, AROM, 20 reps, Both Quad Sets: Supine, 10 reps, AROM, Both (w/ 3 second hold, tactile cueing behind the knee for proper form) Short Arc Quad: Supine, 10  reps, AROM, Both (pillow behind knee) Heel Slides: Supine, 15 reps, AAROM, Both Hip ABduction/ADduction: Supine, 15 reps, AROM, Both    General Comments        Pertinent Vitals/Pain Pain Assessment Pain Assessment: 0-10 Pain Intervention(s): Limited activity within patient's tolerance, Monitored during session    Home Living                          Prior Function            PT Goals (current goals can now be found in the care plan section) Acute Rehab PT Goals Patient Stated Goal: to stand again PT Goal Formulation: With patient Time For Goal Achievement: 02/23/22 Potential to  Achieve Goals: Fair Progress towards PT goals: Progressing toward goals    Frequency    Min 2X/week      PT Plan Current plan remains appropriate    Co-evaluation              AM-PAC PT "6 Clicks" Mobility   Outcome Measure  Help needed turning from your back to your side while in a flat bed without using bedrails?: A Lot Help needed moving from lying on your back to sitting on the side of a flat bed without using bedrails?: A Lot Help needed moving to and from a bed to a chair (including a wheelchair)?: Total Help needed standing up from a chair using your arms (e.g., wheelchair or bedside chair)?: Total Help needed to walk in hospital room?: Total Help needed climbing 3-5 steps with a railing? : Total 6 Click Score: 8    End of Session   Activity Tolerance: Patient tolerated treatment well Patient left: in bed;with call bell/phone within reach;with bed alarm set Nurse Communication: Mobility status PT Visit Diagnosis: History of falling (Z91.81);Muscle weakness (generalized) (M62.81)     Time: 6270-3500 PT Time Calculation (min) (ACUTE ONLY): 17 min  Charges:  $Therapeutic Exercise: 8-22 mins                     Angelica Ran, PT  02/13/22. 10:49 AM

## 2022-02-13 NOTE — Anesthesia Preprocedure Evaluation (Addendum)
Anesthesia Evaluation  Patient identified by MRN, date of birth, ID band Patient awake    Reviewed: Allergy & Precautions, NPO status , Patient's Chart, lab work & pertinent test results  Airway Mallampati: III  TM Distance: >3 FB Neck ROM: full    Dental  (+) Poor Dentition, Edentulous Upper, Missing,    Pulmonary asthma , COPD,  oxygen dependent,    Pulmonary exam normal        Cardiovascular negative cardio ROS Normal cardiovascular exam     Neuro/Psych  Headaches, PSYCHIATRIC DISORDERS Bipolar Disorder Deconditioning with generalized weakness CVA (Pt reports in septembe)    GI/Hepatic Neg liver ROS, hiatal hernia, GERD  Medicated,Intractable nausea and vomiting, GERD with hiatal hernia, esophageal diverticulum  EGD 08/17/2021 Impression:       - Diverticulum in the distal esophagus. - LA Grade D esophagitis. Likely due to stasis. - Small paraesophageal hernia. - A fundoplication was found. The wrap appears intact. - Normal stomach. - Normal examined duodenum. - No specimens collected.   Endo/Other  Morbid obesity  Renal/GU      Musculoskeletal   Abdominal (+) + obese,   Peds  Hematology  (+) Blood dyscrasia, anemia ,   Anesthesia Other Findings Hypokalemia has been treated.   Past Medical History: No date: Asthma No date: Bipolar 1 disorder (HCC) No date: COPD (chronic obstructive pulmonary disease) (HCC) No date: Enlarged heart No date: GERD (gastroesophageal reflux disease) No date: Migraines  Past Surgical History: No date: ABDOMINAL HYSTERECTOMY No date: ABDOMINAL SURGERY     Comment:  "For acid reflux" No date: CESAREAN SECTION  BMI    Body Mass Index: 42.68 kg/m      Reproductive/Obstetrics negative OB ROS                          Anesthesia Physical Anesthesia Plan  ASA: 3  Anesthesia Plan: General ETT   Post-op Pain Management:    Induction:  Intravenous and Rapid sequence  PONV Risk Score and Plan: Ondansetron, Treatment may vary due to age or medical condition and TIVA  Airway Management Planned: Oral ETT  Additional Equipment:   Intra-op Plan:   Post-operative Plan: Extubation in OR  Informed Consent: I have reviewed the patients History and Physical, chart, labs and discussed the procedure including the risks, benefits and alternatives for the proposed anesthesia with the patient or authorized representative who has indicated his/her understanding and acceptance.     Dental advisory given  Plan Discussed with: Anesthesiologist and CRNA  Anesthesia Plan Comments:        Anesthesia Quick Evaluation

## 2022-02-14 ENCOUNTER — Encounter: Payer: Self-pay | Admitting: Internal Medicine

## 2022-02-14 LAB — CBC
HCT: 28.4 % — ABNORMAL LOW (ref 36.0–46.0)
Hemoglobin: 9.1 g/dL — ABNORMAL LOW (ref 12.0–15.0)
MCH: 27.6 pg (ref 26.0–34.0)
MCHC: 32 g/dL (ref 30.0–36.0)
MCV: 86.1 fL (ref 80.0–100.0)
Platelets: 187 10*3/uL (ref 150–400)
RBC: 3.3 MIL/uL — ABNORMAL LOW (ref 3.87–5.11)
RDW: 15.2 % (ref 11.5–15.5)
WBC: 5.3 10*3/uL (ref 4.0–10.5)
nRBC: 0 % (ref 0.0–0.2)

## 2022-02-14 LAB — BASIC METABOLIC PANEL
Anion gap: 3 — ABNORMAL LOW (ref 5–15)
BUN: <5 mg/dL — ABNORMAL LOW (ref 6–20)
CO2: 28 mmol/L (ref 22–32)
Calcium: 8.1 mg/dL — ABNORMAL LOW (ref 8.9–10.3)
Chloride: 105 mmol/L (ref 98–111)
Creatinine, Ser: 0.57 mg/dL (ref 0.44–1.00)
GFR, Estimated: >60 mL/min (ref >60)
Glucose, Bld: 114 mg/dL — ABNORMAL HIGH (ref 70–99)
Potassium: 4.3 mmol/L (ref 3.5–5.1)
Sodium: 136 mmol/L (ref 135–145)

## 2022-02-14 LAB — MAGNESIUM: Magnesium: 2.1 mg/dL (ref 1.7–2.4)

## 2022-02-14 LAB — LAMOTRIGINE LEVEL: Lamotrigine Lvl: <1 ug/mL — ABNORMAL LOW (ref 2.0–20.0)

## 2022-02-14 MED ORDER — ENOXAPARIN SODIUM 60 MG/0.6ML IJ SOSY
0.5000 mg/kg | PREFILLED_SYRINGE | INTRAMUSCULAR | Status: DC
Start: 2022-02-14 — End: 2022-02-17
  Administered 2022-02-14 – 2022-02-16 (×3): 57.5 mg via SUBCUTANEOUS
  Filled 2022-02-14 (×3): qty 0.6

## 2022-02-14 MED ORDER — ONDANSETRON HCL 4 MG PO TABS
4.0000 mg | ORAL_TABLET | Freq: Three times a day (TID) | ORAL | Status: DC
  Administered 2022-02-15: 4 mg via ORAL
  Filled 2022-02-14 (×5): qty 1

## 2022-02-14 MED ORDER — PANTOPRAZOLE SODIUM 40 MG PO TBEC
40.0000 mg | DELAYED_RELEASE_TABLET | Freq: Two times a day (BID) | ORAL | Status: DC
  Filled 2022-02-14 (×4): qty 1

## 2022-02-14 MED ORDER — ASCORBIC ACID 500 MG PO TABS
500.0000 mg | ORAL_TABLET | Freq: Two times a day (BID) | ORAL | Status: DC
  Filled 2022-02-14 (×4): qty 1

## 2022-02-14 MED ORDER — ADULT MULTIVITAMIN W/MINERALS CH
1.0000 | ORAL_TABLET | Freq: Every day | ORAL | Status: DC
  Filled 2022-02-14 (×2): qty 1

## 2022-02-14 MED ORDER — KATE FARMS STANDARD 1.4 PO LIQD
325.0000 mL | Freq: Three times a day (TID) | ORAL | Status: DC
  Filled 2022-02-14: qty 325

## 2022-02-14 NOTE — Plan of Care (Signed)
  Problem: Clinical Measurements: Goal: Diagnostic test results will improve Outcome: Progressing Goal: Respiratory complications will improve Outcome: Progressing   

## 2022-02-14 NOTE — Progress Notes (Signed)
Progress Note   Patient: Tiffany [name] DOB: [birth year] DOA: 02/07/2022     6 DOS: the patient was seen and examined on 02/14/2022   Brief hospital course: Tiffany [name] is a 61 y.o. female with medical history significant for asthma, COPD, bipolar 1 disorder, GERD and migraine, who presented to the ER on 02/07/2022 with intractable nausea and vomiting for the last 8 days.  She denied having any diarrhea but reported lower abdominal discomfort more on the right than the left.  Pt had been seen in the ER couple of days prior for her nausea and vomiting were managed with antiemetics and hydration and therefore was thought to be stable for discharge given tolerating PO intake at that time.  In the ED this time, labs showed K 2.7, Mg 1.4.   CT abdomen/pelvis was non-acute but showed a dilated appendix without signs of appendicitis.  Admitted to medicine service with general surgery consulted to review appendix findings on imaging.    Patient on supportive care for likely gastroenteritis and electrolytes are being corrected.   Assessment and Plan: * Intractable nausea and vomiting- (present on admission) Present on admission, persistent for over a week per patient.  No abdominal pain other than abdominal wall muscles secondary to strain from vomiting. 2/14: N/V still persistent, going for EGD  --Reglan stopped, was unhelpful and pt developed diarrhea, very low suspicion for motility issue Plan: --d/c MIVF --Sheduled Zofran TID before meals --Additional PRN antiemetics   Inadequate oral intake --supplements per dietician  Hiatal hernia S/p Nissen fundoplication, intact  History of esophagitis Noted in chart review, PCP office note from 08/21/2021 and GI consult notes from admission here late October 2022.  Appears EGD at that time showed grade D esophagitis with an esophageal diverticulum and paraesophageal hernia with history of Nissen fundoplication with intact wrap.    --EGD this admission neg for esophagitis  Obesity, Class III, BMI 40-49.9 (morbid obesity) (HCC)- (present on admission) Body mass index is 42.68 kg/m. Complicates overall care and prognosis.  Recommend lifestyle modifications including physical activity and diet for weight loss and overall long-term health.   Dyslipidemia- (present on admission) Hold statin until tolerating PO intake better  History of migraine Continue as needed Imitrex  Asthma, chronic- (present on admission) Asthma/COPD is stable without exacerbation. -- Continue Spiriva -- Pulmicort nebs  Bipolar 1 disorder (HCC)- (present on admission) Appears stable.   --Pt has been refusing Lamictal and Cymbalta.  Hyponatremia- (present on admission) Resolved with IV hydration.   Present on admission, mild with sodium 134.  Likely hypovolemic in the setting of nausea vomiting.   --d/c MIVF today  Hypomagnesemia- (present on admission) --monitor and replete with IV mag  Pressure injury of skin- (present on admission) Present admission.  I agree with the wound description as outlined below. Frequent repositioning of patient. Local wound care and monitor closely  Pressure Injury 02/07/22 Buttocks Left Stage 2 -  Partial thickness loss of dermis presenting as a shallow open injury with a red, pink wound bed without slough. red and flaky (Active)  02/07/22 2238  Location: Buttocks  Location Orientation: Left  Staging: Stage 2 -  Partial thickness loss of dermis presenting as a shallow open injury with a red, pink wound bed without slough.  Wound Description (Comments): red and flaky  Present on Admission: Yes     Pressure Injury Buttocks Right Stage 2 -  Partial thickness loss of dermis presenting as a shallow open injury with a  red, pink wound bed without slough. pink and flaky (Active)     Location: Buttocks  Location Orientation: Right  Staging: Stage 2 -  Partial thickness loss of dermis presenting as a  shallow open injury with a red, pink wound bed without slough.  Wound Description (Comments): pink and flaky  Present on Admission: Yes        GERD (gastroesophageal reflux disease)- (present on admission) --switch back to oral PPI BID  Thrombocytopenia (HCC)- (present on admission) Of unclear significance, normal now.   Hypokalemia- (present on admission) --monitor and replete PRN    DVT prophylaxis: Lovenox SQ Code Status: Full code  Family Communication:  Status is: inpatient Dispo:   The patient is from: home Anticipated d/c is to: SNF Anticipated d/c date is: whenever bed  Patient currently is medically stable to d/c.     Subjective:  Pt reported 24/7 nausea, but also reported anti-emetics help.  Pt reported vomiting after oral intake, though unwitnessed.  Reported having BM's.   Physical Exam: Vitals:   02/13/22 2156 02/14/22 0418 02/14/22 0823 02/14/22 1726  BP: 115/79 (!) 98/54 122/68 139/65  Pulse: 88  72 88  Resp: 18 18 17 16   Temp: 97.6 F (36.4 C) 98.4 F (36.9 C) 98.5 F (36.9 C) 98.1 F (36.7 C)  TempSrc:  Oral Oral Oral  SpO2: 100% 98% 100% 100%  Weight:      Height:        Constitutional: NAD, AAOx3 CV: No cyanosis.   RESP: normal respiratory effort, on RA Extremities: trace pitting edema in BLE SKIN: warm, dry   Author: , MD 02/14/2022 6:59 PM  For on call review [email or web address].

## 2022-02-14 NOTE — TOC Progression Note (Signed)
Transition of Care [facility]) - Progression Note    Patient Details  Name: SHAWANNA ZANDERS MRN: [id number] Date of Birth: Sep 02, 1961  Transition of Care [facility]) CM/SW Contact  Chapman Fitch, RN Phone Number: 02/14/2022, 1:51 PM  Clinical Narrative:    Spoke with Demtrius at [facility].  They no longer have a SNF/long term care bed to offer to the patient  Spoke with Tessa at [facility]. - She is going to review to see if they still have a SNF/LTC bed available    Expected Discharge Plan: Skilled Nursing Facility Barriers to Discharge: Continued Medical Work up, SNF Pending bed offer  Expected Discharge Plan and Services Expected Discharge Plan: Skilled Nursing Facility     Post Acute Care Choice: Skilled Nursing Facility Living arrangements for the past 2 months: Hotel/Motel                                       Social Determinants of Health (SDOH) Interventions    Readmission Risk Interventions Readmission Risk Prevention Plan 11/08/2021  Transportation Screening Complete  PCP or Specialist Appt within 5-7 Days Complete  Home Care Screening Complete  Medication Review (RN CM) Complete  Some recent data might be hidden

## 2022-02-14 NOTE — Progress Notes (Signed)
PHARMACIST - PHYSICIAN COMMUNICATION  CONCERNING:  Enoxaparin (Lovenox) for DVT Prophylaxis    RECOMMENDATION: Patient was prescribed enoxaprin 40mg  q24 hours for VTE prophylaxis.   Filed Weights   02/07/22 1213 02/13/22 1610  Weight: 131.1 kg (289 lb 0.4 oz) 117.3 kg (258 lb 9.6 oz)    Body mass index is 38.19 kg/m.  Estimated Creatinine Clearance: 103.5 mL/min (by C-G formula based on SCr of 0.57 mg/dL).   Based on [facility] policy patient is candidate for enoxaparin 0.5mg /kg TBW SQ every 24 hours based on BMI being >30.  DESCRIPTION: Pharmacy has adjusted enoxaparin dose per [facility] policy.  Patient is now receiving enoxaparin 57.5 mg every 24 hours    [facility], PharmD Clinical Pharmacist  02/14/2022 7:06 PM

## 2022-02-14 NOTE — Progress Notes (Signed)
Physical Therapy Treatment Patient Details Name: Tiffany [name] MRN: [id number] DOB: [birth year] Today's Date: 02/14/2022   History of Present Illness 61 y.o. female with medical history significant for asthma, COPD, bipolar 1 disorder, GERD and migraine, who presented to the ER on 02/07/2022 with intractable nausea and vomiting for the last 8 days.    PT Comments    Patient tolerated session well and was agreeable to treatment. Upon arrival patient was supine in bed talking with her son. Patient was motivated to sit EOB since her son was present, as long as he assisted as he has assisted her at home. Patient required Max A +2 (for safety with son assisted at posterior trunk) to complete supine<>sitting transfers. Patient was able to tolerate 2 minutes sitting EOB before requesting to lay back down due to back discomfort. EOB and bed level therapeutic exercises continue to focus on BLE strengthening. Patient would continue to benefit from continued skilled physical therapy in order to improve functional independence, strength, and safety. Continue to recommend STR upon discharge from acute hospitalization.   Recommendations for follow up therapy are one component of a multi-disciplinary discharge planning process, led by the attending physician.  Recommendations may be updated based on patient status, additional functional criteria and insurance authorization.  Follow Up Recommendations  Skilled nursing-short term rehab (<3 hours/day)     Assistance Recommended at Discharge Intermittent Supervision/Assistance  Patient can return home with the following Two people to help with walking and/or transfers;Two people to help with bathing/dressing/bathroom;Assistance with cooking/housework;Direct supervision/assist for medications management;Assist for transportation;Help with stairs or ramp for entrance;Direct supervision/assist for financial management   Equipment Recommendations  None recommended by  PT    Recommendations for Other Services       Precautions / Restrictions Precautions Precautions: Fall Restrictions Weight Bearing Restrictions: No     Mobility  Bed Mobility Overal bed mobility: Needs Assistance Bed Mobility: Rolling (x2 to the left) Rolling: Max assist (+2 (son to place pillow under hip for pressure relief))   Supine to sit: Mod assist, +2 for physical assistance Sit to supine: Mod assist, +2 for physical assistance   General bed mobility comments: Pt does initiate some movement but needing increased assistance for safety, patient is very fearful of bilateral feet touching the grouds as "it is dirty" per son    Transfers                   General transfer comment: not attempted - has not performed since 11/22    Ambulation/Gait                   Stairs             Wheelchair Mobility    Modified Rankin (Stroke Patients Only)       Balance Overall balance assessment: Needs assistance Sitting-balance support: Bilateral upper extremity supported, Feet unsupported Sitting balance-Leahy Scale: Poor Sitting balance - Comments: CGA from son at posterior trunk, significant verbal cueing for upright posture, when son tried to walk away as patient was demonstrating upright posture patient got upset and told him to stay Postural control: Posterior lean   Standing balance-Leahy Scale: Zero Standing balance comment: deferred                            Cognition Arousal/Alertness: Awake/alert Behavior During Therapy: Flat affect Overall Cognitive Status: Within Functional Limits for tasks assessed  Exercises General Exercises - Lower Extremity Ankle Circles/Pumps: Supine, AROM, 20 reps, Both Quad Sets: Supine, 10 reps, AROM, Both (w/ 3 second hold and tactile cueing to perform movement correctly) Short Arc Quad: Supine, 10 reps, AROM, Both (pillow under  knee) Long Arc Quad: Seated, 15 reps, AROM, Both Heel Slides: Supine, 15 reps, AAROM, Both    General Comments General comments (skin integrity, edema, etc.): HR: 95-100bpm throughout session, SpO2 99%      Pertinent Vitals/Pain Pain Assessment Pain Assessment: No/denies pain Faces Pain Scale: No hurt Pain Location: Patient reported back pain after sitting EOB for ~2 minutes Pain Descriptors / Indicators: Discomfort Pain Intervention(s): Limited activity within patient's tolerance, Monitored during session    Home Living                          Prior Function            PT Goals (current goals can now be found in the care plan section) Acute Rehab PT Goals Patient Stated Goal: to stand again PT Goal Formulation: With patient Time For Goal Achievement: 02/23/22 Potential to Achieve Goals: Fair Progress towards PT goals: Progressing toward goals    Frequency    Min 2X/week      PT Plan Current plan remains appropriate    Co-evaluation              AM-PAC PT "6 Clicks" Mobility   Outcome Measure  Help needed turning from your back to your side while in a flat bed without using bedrails?: A Lot Help needed moving from lying on your back to sitting on the side of a flat bed without using bedrails?: A Lot Help needed moving to and from a bed to a chair (including a wheelchair)?: Total Help needed standing up from a chair using your arms (e.g., wheelchair or bedside chair)?: Total Help needed to walk in hospital room?: Total Help needed climbing 3-5 steps with a railing? : Total 6 Click Score: 8    End of Session Equipment Utilized During Treatment: Oxygen Activity Tolerance: Patient tolerated treatment well Patient left: in bed;with call bell/phone within reach;with bed alarm set Nurse Communication: Mobility status PT Visit Diagnosis: History of falling (Z91.81);Muscle weakness (generalized) (M62.81)     Time: [id number] PT Time Calculation  (min) (ACUTE ONLY): 22 min  Charges:  $Therapeutic Exercise: 8-22 mins                     Iva Boop, PT  02/14/22. 12:04 PM

## 2022-02-15 LAB — BASIC METABOLIC PANEL
Anion gap: 3 — ABNORMAL LOW (ref 5–15)
BUN: <5 mg/dL — ABNORMAL LOW (ref 6–20)
CO2: 29 mmol/L (ref 22–32)
Calcium: 8 mg/dL — ABNORMAL LOW (ref 8.9–10.3)
Chloride: 105 mmol/L (ref 98–111)
Creatinine, Ser: 0.68 mg/dL (ref 0.44–1.00)
GFR, Estimated: >60 mL/min (ref >60)
Glucose, Bld: 82 mg/dL (ref 70–99)
Potassium: 3.6 mmol/L (ref 3.5–5.1)
Sodium: 137 mmol/L (ref 135–145)

## 2022-02-15 LAB — CBC
HCT: 25.3 % — ABNORMAL LOW (ref 36.0–46.0)
Hemoglobin: 8.3 g/dL — ABNORMAL LOW (ref 12.0–15.0)
MCH: 28.1 pg (ref 26.0–34.0)
MCHC: 32.8 g/dL (ref 30.0–36.0)
MCV: 85.8 fL (ref 80.0–100.0)
Platelets: 182 10*3/uL (ref 150–400)
RBC: 2.95 MIL/uL — ABNORMAL LOW (ref 3.87–5.11)
RDW: 15.6 % — ABNORMAL HIGH (ref 11.5–15.5)
WBC: 3.7 10*3/uL — ABNORMAL LOW (ref 4.0–10.5)
nRBC: 0 % (ref 0.0–0.2)

## 2022-02-15 LAB — SURGICAL PATHOLOGY

## 2022-02-15 LAB — MAGNESIUM: Magnesium: 2 mg/dL (ref 1.7–2.4)

## 2022-02-15 NOTE — Progress Notes (Addendum)
Physical Therapy Treatment Patient Details Name: Tiffany [name] MRN: [id number] DOB: 04/27/1961 Today's Date: 02/15/2022   History of Present Illness 61 y.o. female with medical history significant for asthma, COPD, bipolar 1 disorder, GERD and migraine, who presented to the ER on 02/07/2022 with intractable nausea and vomiting for the last 8 days.    PT Comments    Patient is agreeable to PT with encouragement. She participated with LE exercises for strengthening in bed. She required +2 person assistance for bed mobility and was able to maintain sitting balance with Mod A - Max A for a total sitting time of ~ 3 minutes. She is limited by fatigue with activity as well as low back pain reported with sitting upright, however she does self limit activity at times. A lift is recommended for any out of bed activity at this time. Recommend to continue PT to maximize independence and decrease caregiver burden.     Recommendations for follow up therapy are one component of a multi-disciplinary discharge planning process, led by the attending physician.  Recommendations may be updated based on patient status, additional functional criteria and insurance authorization.  Follow Up Recommendations  Skilled nursing-short term rehab (<3 hours/day)     Assistance Recommended at Discharge Intermittent Supervision/Assistance  Patient can return home with the following Two people to help with walking and/or transfers;Two people to help with bathing/dressing/bathroom;Assistance with cooking/housework;Direct supervision/assist for medications management;Assist for transportation;Help with stairs or ramp for entrance;Direct supervision/assist for financial management   Equipment Recommendations  None recommended by PT    Recommendations for Other Services       Precautions / Restrictions Precautions Precautions: Fall Restrictions Weight Bearing Restrictions: No     Mobility  Bed Mobility Overal bed  mobility: Needs Assistance Bed Mobility: Rolling Rolling: Max assist   Supine to sit: +2 for physical assistance, Max assist Sit to supine: +2 for physical assistance, Max assist   General bed mobility comments: patient initiated movement with all mobility with cues for task initiation and sequencing.    Transfers                   General transfer comment: not attempted due to generalized weakness, limited sitting tolerance (back pain). patient has apparently not stood since 11/22.    Ambulation/Gait                   Stairs             Wheelchair Mobility    Modified Rankin (Stroke Patients Only)       Balance                                            Cognition Arousal/Alertness: Awake/alert, Lethargic Behavior During Therapy: Flat affect Overall Cognitive Status: Within Functional Limits for tasks assessed                                 General Comments: initially lethargic but is more alert with auditory stimulation, light touch, and maximal encouragement (from therapists and significant other who is very supportive).        Exercises General Exercises - Lower Extremity Ankle Circles/Pumps: Strengthening, Both, AROM, 10 reps, Supine Short Arc Quad: AAROM, Strengthening, Both, 10 reps, Supine (significant other assist her with performing BLE x 10  reps each) Heel Slides: AAROM, Strengthening, Both, 10 reps, Supine Other Exercises Other Exercises: verbal cues for participation and technique    General Comments        Pertinent Vitals/Pain Pain Assessment Pain Assessment: No/denies pain Faces Pain Scale: Hurts little more Pain Location: lower back Pain Descriptors / Indicators: Discomfort Pain Intervention(s): Limited activity within patient's tolerance    Home Living                          Prior Function            PT Goals (current goals can now be found in the care plan section)  Acute Rehab PT Goals Patient Stated Goal: family wants patient to be able to walk. PT Goal Formulation: With patient Time For Goal Achievement: 02/23/22 Potential to Achieve Goals: Fair Progress towards PT goals: Progressing toward goals    Frequency    Min 2X/week      PT Plan Current plan remains appropriate    Co-evaluation   Reason for Co-Treatment: For patient/therapist safety;To address functional/ADL transfers PT goals addressed during session: Mobility/safety with mobility OT goals addressed during session: ADL's and self-care      AM-PAC PT "6 Clicks" Mobility   Outcome Measure  Help needed turning from your back to your side while in a flat bed without using bedrails?: A Lot Help needed moving from lying on your back to sitting on the side of a flat bed without using bedrails?: Total Help needed moving to and from a bed to a chair (including a wheelchair)?: Total Help needed standing up from a chair using your arms (e.g., wheelchair or bedside chair)?: Total Help needed to walk in hospital room?: Total Help needed climbing 3-5 steps with a railing? : Total 6 Click Score: 7    End of Session Equipment Utilized During Treatment: Oxygen Activity Tolerance: Patient tolerated treatment well Patient left: in bed;with call bell/phone within reach;with bed alarm set;with family/visitor present   PT Visit Diagnosis: History of falling (Z91.81);Muscle weakness (generalized) (M62.81)     Time: 8921-1941 PT Time Calculation (min) (ACUTE ONLY): 24 min  Charges:  $Therapeutic Activity: 8-22 mins                     Donna Bernard, PT, MPT    Ina Homes 02/15/2022, 3:30 PM

## 2022-02-15 NOTE — TOC Progression Note (Signed)
Transition of Care [facility]) - Progression Note    Patient Details  Name: BRIAUNNA GRINDSTAFF MRN: [id number] Date of Birth: [birth year]  Transition of Care [facility]) CM/SW Contact  Chapman Fitch, RN Phone Number: 02/15/2022, 9:30 AM  Clinical Narrative:    Vm left for Tessa at [facility] to follow up on bed offer     Expected Discharge Plan: Skilled Nursing Facility Barriers to Discharge: Continued Medical Work up, SNF Pending bed offer  Expected Discharge Plan and Services Expected Discharge Plan: Skilled Nursing Facility     Post Acute Care Choice: Skilled Nursing Facility Living arrangements for the past 2 months: Hotel/Motel                                       Social Determinants of Health (SDOH) Interventions    Readmission Risk Interventions Readmission Risk Prevention Plan 11/08/2021  Transportation Screening Complete  PCP or Specialist Appt within 5-7 Days Complete  Home Care Screening Complete  Medication Review (RN CM) Complete  Some recent data might be hidden

## 2022-02-15 NOTE — Progress Notes (Signed)
Progress Note   Patient: Tiffany [name] DOB: Jan 28, 1961 DOA: 02/07/2022     7 DOS: the patient was seen and examined on 02/15/2022   Brief hospital course: Tiffany [name] is a 61 y.o. female with medical history significant for asthma, COPD, bipolar 1 disorder, GERD and migraine, who presented to the ER on 02/07/2022 with intractable nausea and vomiting for the last 8 days.  She denied having any diarrhea but reported lower abdominal discomfort more on the right than the left.  Pt had been seen in the ER couple of days prior for her nausea and vomiting were managed with antiemetics and hydration and therefore was thought to be stable for discharge given tolerating PO intake at that time.  In the ED this time, labs showed K 2.7, Mg 1.4.   CT abdomen/pelvis was non-acute but showed a dilated appendix without signs of appendicitis.  Admitted to medicine service with general surgery consulted to review appendix findings on imaging.    Patient on supportive care for likely gastroenteritis and electrolytes are being corrected.   Assessment and Plan: * Intractable nausea and vomiting- (present on admission) Present on admission, persistent for over a week per patient.  No abdominal pain other than abdominal wall muscles secondary to strain from vomiting. 2/14: N/V still persistent, going for EGD  --Reglan stopped, was unhelpful and pt developed diarrhea, very low suspicion for motility issue Plan: --d/c MIVF --Sheduled Zofran TID before meals --Additional PRN antiemetics   Inadequate oral intake --supplements per dietician  Hiatal hernia S/p Nissen fundoplication, intact  History of esophagitis Noted in chart review, PCP office note from 08/21/2021 and GI consult notes from admission here late October 2022.  Appears EGD at that time showed grade D esophagitis with an esophageal diverticulum and paraesophageal hernia with history of Nissen fundoplication with intact wrap.    --EGD this admission neg for esophagitis  Obesity, Class III, BMI 40-49.9 (morbid obesity) (HCC)- (present on admission) Body mass index is 42.68 kg/m. Complicates overall care and prognosis.  Recommend lifestyle modifications including physical activity and diet for weight loss and overall long-term health.   Dyslipidemia- (present on admission) Hold statin until tolerating PO intake better  History of migraine Continue as needed Imitrex  Asthma, chronic- (present on admission) Asthma/COPD is stable without exacerbation. -- Continue Spiriva -- Pulmicort nebs  Bipolar 1 disorder (HCC)- (present on admission) Appears stable.   --Pt has been refusing Lamictal and Cymbalta.  Hyponatremia- (present on admission) Resolved with IV hydration.   Present on admission, mild with sodium 134.  Likely hypovolemic in the setting of nausea vomiting.   --d/c MIVF today  Hypomagnesemia- (present on admission) --monitor and replete with IV mag  Pressure injury of skin- (present on admission) Present admission.  I agree with the wound description as outlined below. Frequent repositioning of patient. Local wound care and monitor closely  Pressure Injury 02/07/22 Buttocks Left Stage 2 -  Partial thickness loss of dermis presenting as a shallow open injury with a red, pink wound bed without slough. red and flaky (Active)  02/07/22 2238  Location: Buttocks  Location Orientation: Left  Staging: Stage 2 -  Partial thickness loss of dermis presenting as a shallow open injury with a red, pink wound bed without slough.  Wound Description (Comments): red and flaky  Present on Admission: Yes     Pressure Injury Buttocks Right Stage 2 -  Partial thickness loss of dermis presenting as a shallow open injury with a  red, pink wound bed without slough. pink and flaky (Active)     Location: Buttocks  Location Orientation: Right  Staging: Stage 2 -  Partial thickness loss of dermis presenting as a  shallow open injury with a red, pink wound bed without slough.  Wound Description (Comments): pink and flaky  Present on Admission: Yes        GERD (gastroesophageal reflux disease)- (present on admission) --oral PPI BID  Thrombocytopenia (HCC)- (present on admission) Of unclear significance, normal now.   Hypokalemia- (present on admission) --monitor and replete PRN    DVT prophylaxis: Lovenox SQ Code Status: Full code  Family Communication: fiance and daughter updated at bedside today Status is: inpatient Dispo:   The patient is from: home Anticipated d/c is to: LTC SNF vs home with finance  Anticipated d/c date is: whenever bed  Patient currently is medically stable to d/c.     Subjective:  Pt reported tolerating some oral intake today.   Physical Exam: Vitals:   02/15/22 0750 02/15/22 0800 02/15/22 1739 02/15/22 2023  BP:  119/65 (!) 113/53 113/62  Pulse:  76 80 84  Resp:    20  Temp:  97.9 F (36.6 C) 98.4 F (36.9 C) 98.7 F (37.1 C)  TempSrc:  Oral Oral Oral  SpO2: 98% 99% 99% 100%  Weight:      Height:        Constitutional: NAD, AAOx3 HEENT: conjunctivae and lids normal, EOMI CV: No cyanosis.   RESP: normal respiratory effort, on RA SKIN: warm, dry   Author: Darlin Priestly, MD 02/15/2022 8:43 PM  For on call review [email or web address].

## 2022-02-15 NOTE — Progress Notes (Signed)
Occupational Therapy Treatment Patient Details Name: Tiffany [name] MRN: [id number] DOB: July 19, 1961 Today's Date: 02/15/2022   History of present illness 61 y.o. female with medical history significant for asthma, COPD, bipolar 1 disorder, GERD and migraine, who presented to the ER on 02/07/2022 with intractable nausea and vomiting for the last 8 days.   OT comments  Upon entering the room, pt supine and sleeping soundly with significant other present in the room. Pt seen for skilled co-treatment with PT. Pt needing multiple attempts to alert in order to participate in therapeutic intervention. Pt needing hand over hand assistance to start washing face with min cuing for encouragement. Pt's significant other trying very hard to motivate pt and is very hands on to assist staff this session. He also demonstrates exercises for B UEs and LEs that perform at home that have been taught by [facility] therapist.  Pt needing max A of 2 for supine <>sit. Pt resistive on EOB an pushing back towards bed secondary to reported back pain with mod - max A sitting balance. Pt holds tooth brush in hands and brings it towards mouth but reports she is unable to complete task. She does not tolerate hand over hand assistance with this tasks and therefore therapist provided total A for oral hygiene. Pt returning back to bed in same manner. Pt rolling L <> R and initiates reaching towards hospital bed rail to roll with cuing for technique and needing max A to roll in both directions. Pt remains in bed at end of session with all needs within reach. She continues to benefit from OT intervention.    Recommendations for follow up therapy are one component of a multi-disciplinary discharge planning process, led by the attending physician.  Recommendations may be updated based on patient status, additional functional criteria and insurance authorization.    Follow Up Recommendations  Skilled nursing-short term rehab (<3 hours/day)     Assistance Recommended at Discharge Frequent or constant Supervision/Assistance  Patient can return home with the following  Assist for transportation;Assistance with cooking/housework;Help with stairs or ramp for entrance;Direct supervision/assist for medications management;A lot of help with bathing/dressing/bathroom;Two people to help with walking and/or transfers   Equipment Recommendations  Other (comment) (defer to next venue of care)       Precautions / Restrictions Precautions Precautions: Fall       Mobility Bed Mobility Overal bed mobility: Needs Assistance Bed Mobility: Rolling Rolling: Max assist   Supine to sit: +2 for physical assistance, Max assist Sit to supine: +2 for physical assistance, Max assist   General bed mobility comments: Pt initiates movement with cuing for technique and hand placement    Transfers                   General transfer comment: not attempted - has not performed since 11/22     Balance Overall balance assessment: Needs assistance Sitting-balance support: Bilateral upper extremity supported, Feet unsupported Sitting balance-Leahy Scale: Poor Sitting balance - Comments: mod - max A for sitting balance                                   ADL either performed or assessed with clinical judgement   ADL Overall ADL's : Needs assistance/impaired     Grooming: Wash/dry face;Bed level;Set up;Minimal assistance;Oral care;Total assistance Grooming Details (indicate cue type and reason): hand over hand assistance to initiate task. Total A for oral  care while sitting on EOB.                                    Extremity/Trunk Assessment Upper Extremity Assessment Upper Extremity Assessment: Generalized weakness   Lower Extremity Assessment Lower Extremity Assessment: Generalized weakness        Vision Patient Visual Report: No change from baseline            Cognition Arousal/Alertness:  Awake/alert, Lethargic Behavior During Therapy: Flat affect Overall Cognitive Status: Within Functional Limits for tasks assessed                                 General Comments: Pt initially lethargic and pt  needing max encouragement to participate.                   Pertinent Vitals/ Pain       Pain Assessment Pain Assessment: Faces Faces Pain Scale: Hurts little more Pain Location: lower back Pain Descriptors / Indicators: Discomfort, Aching Pain Intervention(s): Limited activity within patient's tolerance, Monitored during session, Repositioned         Frequency  Min 2X/week        Progress Toward Goals  OT Goals(current goals can now be found in the care plan section)  Progress towards OT goals: Progressing toward goals  Acute Rehab OT Goals Patient Stated Goal: to get stronger OT Goal Formulation: With patient/family Time For Goal Achievement: 02/23/22 Potential to Achieve Goals: Good  Plan Discharge plan remains appropriate;Frequency remains appropriate    Co-evaluation    PT/OT/SLP Co-Evaluation/Treatment: Yes Reason for Co-Treatment: For patient/therapist safety;To address functional/ADL transfers;Complexity of the patient's impairments (multi-system involvement) PT goals addressed during session: Mobility/safety with mobility OT goals addressed during session: ADL's and self-care      AM-PAC OT "6 Clicks" Daily Activity     Outcome Measure   Help from another person eating meals?: A Little Help from another person taking care of personal grooming?: A Little Help from another person toileting, which includes using toliet, bedpan, or urinal?: Total Help from another person bathing (including washing, rinsing, drying)?: A Lot Help from another person to put on and taking off regular upper body clothing?: A Lot Help from another person to put on and taking off regular lower body clothing?: Total 6 Click Score: 12    End of Session     OT Visit Diagnosis: Other abnormalities of gait and mobility (R26.89);Repeated falls (R29.6);Muscle weakness (generalized) (M62.81)   Activity Tolerance Patient tolerated treatment well   Patient Left in bed;with call bell/phone within reach;with bed alarm set;with family/visitor present   Nurse Communication          Time: 0037-0488 OT Time Calculation (min): 24 min  Charges: OT General Charges $OT Visit: 1 Visit OT Treatments $Self Care/Home Management : 8-22 mins  Jackquline Denmark, MS, OTR/L , CBIS ascom [phone]  02/15/22, 3:04 PM

## 2022-02-16 LAB — CBC
HCT: 27.2 % — ABNORMAL LOW (ref 36.0–46.0)
Hemoglobin: 8.7 g/dL — ABNORMAL LOW (ref 12.0–15.0)
MCH: 27.2 pg (ref 26.0–34.0)
MCHC: 32 g/dL (ref 30.0–36.0)
MCV: 85 fL (ref 80.0–100.0)
Platelets: 177 10*3/uL (ref 150–400)
RBC: 3.2 MIL/uL — ABNORMAL LOW (ref 3.87–5.11)
RDW: 15.5 % (ref 11.5–15.5)
WBC: 3.7 10*3/uL — ABNORMAL LOW (ref 4.0–10.5)
nRBC: 0 % (ref 0.0–0.2)

## 2022-02-16 LAB — BASIC METABOLIC PANEL
Anion gap: 4 — ABNORMAL LOW (ref 5–15)
BUN: <5 mg/dL — ABNORMAL LOW (ref 6–20)
CO2: 29 mmol/L (ref 22–32)
Calcium: 8.1 mg/dL — ABNORMAL LOW (ref 8.9–10.3)
Chloride: 103 mmol/L (ref 98–111)
Creatinine, Ser: 0.52 mg/dL (ref 0.44–1.00)
GFR, Estimated: >60 mL/min (ref >60)
Glucose, Bld: 82 mg/dL (ref 70–99)
Potassium: 3.5 mmol/L (ref 3.5–5.1)
Sodium: 136 mmol/L (ref 135–145)

## 2022-02-16 LAB — MAGNESIUM: Magnesium: 1.7 mg/dL (ref 1.7–2.4)

## 2022-02-16 NOTE — Progress Notes (Signed)
Progress Note   Patient: Tiffany [name] GMW:[id number] DOB: 03-Sep-1961 DOA: 02/07/2022     8 DOS: the patient was seen and examined on 02/16/2022   Brief hospital course: AIDEEN FENSTER is a 61 y.o. female with medical history significant for asthma, COPD, bipolar 1 disorder, GERD and migraine, who presented to the ER on 02/07/2022 with intractable nausea and vomiting for the last 8 days.  She denied having any diarrhea but reported lower abdominal discomfort more on the right than the left.  Pt had been seen in the ER couple of days prior for her nausea and vomiting were managed with antiemetics and hydration and therefore was thought to be stable for discharge given tolerating PO intake at that time.  In the ED this time, labs showed K 2.7, Mg 1.4.   CT abdomen/pelvis was non-acute but showed a dilated appendix without signs of appendicitis.  Admitted to medicine service with general surgery consulted to review appendix findings on imaging.    Patient on supportive care for likely gastroenteritis and electrolytes are being corrected.   Assessment and Plan: * Intractable nausea and vomiting- (present on admission) Present on admission, persistent for over a week per patient.  No abdominal pain other than abdominal wall muscles secondary to strain from vomiting. 2/14: N/V still persistent, EGD didn't show reason for N/V --Reglan stopped, was unhelpful and pt developed diarrhea, very low suspicion for motility issue Plan: --Sheduled Zofran TID before meals, although pt has been refusing them --IV compazine PRN, pt prefers   Inadequate oral intake --supplements per dietician  Hiatal hernia S/p Nissen fundoplication, intact  History of esophagitis Noted in chart review, PCP office note from 08/21/2021 and GI consult notes from admission here late October 2022.  Appears EGD at that time showed grade D esophagitis with an esophageal diverticulum and paraesophageal hernia with history of  Nissen fundoplication with intact wrap.   --EGD this admission neg for esophagitis  Obesity, Class III, BMI 40-49.9 (morbid obesity) (HCC)- (present on admission) Body mass index is 42.68 kg/m. Complicates overall care and prognosis.  Recommend lifestyle modifications including physical activity and diet for weight loss and overall long-term health.   Dyslipidemia- (present on admission) Hold statin until tolerating PO intake better  History of migraine Continue as needed Imitrex  Asthma, chronic- (present on admission) Asthma/COPD is stable without exacerbation. -- Continue Spiriva -- Pulmicort nebs  Bipolar 1 disorder (HCC)- (present on admission) Appears stable.   --Pt has been refusing Lamictal and Cymbalta.  Hyponatremia- (present on admission) Resolved with IV hydration.   Present on admission, mild with sodium 134.  Likely hypovolemic in the setting of nausea vomiting.   --d/c MIVF today  Hypomagnesemia- (present on admission) --monitor and replete with IV mag  Pressure injury of skin- (present on admission) Present admission.  I agree with the wound description as outlined below. Frequent repositioning of patient. Local wound care and monitor closely  Pressure Injury 02/07/22 Buttocks Left Stage 2 -  Partial thickness loss of dermis presenting as a shallow open injury with a red, pink wound bed without slough. red and flaky (Active)  02/07/22 2238  Location: Buttocks  Location Orientation: Left  Staging: Stage 2 -  Partial thickness loss of dermis presenting as a shallow open injury with a red, pink wound bed without slough.  Wound Description (Comments): red and flaky  Present on Admission: Yes     Pressure Injury Buttocks Right Stage 2 -  Partial thickness loss of dermis  presenting as a shallow open injury with a red, pink wound bed without slough. pink and flaky (Active)     Location: Buttocks  Location Orientation: Right  Staging: Stage 2 -  Partial  thickness loss of dermis presenting as a shallow open injury with a red, pink wound bed without slough.  Wound Description (Comments): pink and flaky  Present on Admission: Yes        GERD (gastroesophageal reflux disease)- (present on admission) --oral PPI BID  Thrombocytopenia (HCC)- (present on admission) Of unclear significance, normal now.   Hypokalemia- (present on admission) --monitor and replete PRN    DVT prophylaxis: Lovenox SQ Code Status: Full code  Family Communication: daughter updated at bedside today Status is: inpatient Dispo:   The patient is from: home Anticipated d/c is to: home with [facility]   Anticipated d/c date is: tomorrow  Patient currently is medically stable to d/c.     Subjective:  Pt reported she tolerated breakfast without vomiting.  Pt also said she wanted to go home tomorrow.   Physical Exam: Vitals:   02/15/22 1739 02/15/22 2023 02/16/22 0400 02/16/22 0801  BP: (!) 113/53 113/62 (!) 144/62 (!) 108/58  Pulse: 80 84 82 83  Resp:  20 19 16   Temp: 98.4 F (36.9 C) 98.7 F (37.1 C) 98.1 F (36.7 C) 98.6 F (37 C)  TempSrc: Oral Oral Oral Oral  SpO2: 99% 100% 98% 99%  Weight:      Height:        Constitutional: NAD, AAOx3 HEENT: conjunctivae and lids normal, EOMI CV: No cyanosis.   RESP: normal respiratory effort, on RA SKIN: warm, dry Neuro: II - XII grossly intact.   Psych: better mood and affect today    Author: , MD 02/16/2022 6:14 PM  For on call review [email or web address].

## 2022-02-16 NOTE — TOC Progression Note (Addendum)
Transition of Care [facility]) - Progression Note    Patient Details  Name: GAYLYNN SEIPLE MRN: [id number] Date of Birth: 20-Jul-1961  Transition of Care [facility]) CM/SW [place], LCSW Phone Number: 02/16/2022, 1:13 PM  Clinical Narrative:  Met with patient and her children at bedside. Patient prefers to return to the [facility] at discharge. Left voicemail for Lucile Crater at [facility]. She contacted CSW on Monday and said to call her if patient goes home so she can expedite personal care services. Notified DSS social worker, Robet Leu. She will discuss with her supervisor.   1:36 pm: Per outpatient note from Alyssa Grove, RN at [place]:  "Glendora Score, [employer] [place] called Patient Service Manager (PSM) regarding patient's [facility] referral status with [facility]. Explained that we have not received a new referral for all disciplines as of this date. ([place] do not offer ST and patient was requesting to go with [facility] in order to get all services from one agency.)  Noted in Care Everywhere that the patient went to the ED on 02/06 at [facility]. Ms. Luana Shu stated they were notified that the patient was in the hospital as well. Stated they have PT in currently for [facility] and will plan to stay in until [facility] receives the referral for all services needed.   Routing this message to Dr. Ebony Hail to update and request referral to [facility] for SN, PT, ST, HHS once patient is discharged from [facility]."  Spoke to [place] at [facility] and faxed orders for review. Noted on fax cover sheet, plan for discharge tomorrow. Patient confirmed hotel will not let her have hospital bed. Called hotel and they said hoyer lift would be too big for the room.  4:36 pm: Received call from [facility] from [facility]. She was start expedited PCS process but they likely won't be able to start until Monday. She will call patient to complete assessment. No call  back from DSS yet.  Expected Discharge Plan: [place] Barriers to Discharge: Continued Medical Work up, SNF Pending bed offer  Expected Discharge Plan and Services Expected Discharge Plan: [place] Choice: [place] arrangements for the past 2 months: Hotel/Motel                                       Social Determinants of Health (SDOH) Interventions    Readmission Risk Interventions Readmission Risk Prevention Plan 11/08/2021  Transportation Screening Complete  PCP or Specialist Appt within 5-7 Days Complete  Home Care Screening Complete  Medication Review (RN CM) Complete  Some recent data might be hidden

## 2022-02-17 LAB — BASIC METABOLIC PANEL
Anion gap: 4 — ABNORMAL LOW (ref 5–15)
BUN: <5 mg/dL — ABNORMAL LOW (ref 6–20)
CO2: 30 mmol/L (ref 22–32)
Calcium: 8.1 mg/dL — ABNORMAL LOW (ref 8.9–10.3)
Chloride: 101 mmol/L (ref 98–111)
Creatinine, Ser: 0.49 mg/dL (ref 0.44–1.00)
GFR, Estimated: >60 mL/min (ref >60)
Glucose, Bld: 78 mg/dL (ref 70–99)
Potassium: 3.2 mmol/L — ABNORMAL LOW (ref 3.5–5.1)
Sodium: 135 mmol/L (ref 135–145)

## 2022-02-17 LAB — CBC
HCT: 27.5 % — ABNORMAL LOW (ref 36.0–46.0)
Hemoglobin: 9 g/dL — ABNORMAL LOW (ref 12.0–15.0)
MCH: 28 pg (ref 26.0–34.0)
MCHC: 32.7 g/dL (ref 30.0–36.0)
MCV: 85.4 fL (ref 80.0–100.0)
Platelets: 185 10*3/uL (ref 150–400)
RBC: 3.22 MIL/uL — ABNORMAL LOW (ref 3.87–5.11)
RDW: 15.3 % (ref 11.5–15.5)
WBC: 4.6 10*3/uL (ref 4.0–10.5)
nRBC: 0 % (ref 0.0–0.2)

## 2022-02-17 LAB — MAGNESIUM: Magnesium: 1.8 mg/dL (ref 1.7–2.4)

## 2022-02-17 MED ORDER — POTASSIUM CHLORIDE CRYS ER 20 MEQ PO TBCR
40.0000 meq | EXTENDED_RELEASE_TABLET | Freq: Once | ORAL | Status: DC
  Filled 2022-02-17: qty 2

## 2022-02-17 MED ORDER — POTASSIUM CHLORIDE CRYS ER 20 MEQ PO TBCR: 20.0000 meq | EXTENDED_RELEASE_TABLET | Freq: Every day | ORAL | 0 refills | Status: DC

## 2022-02-17 MED ORDER — PROCHLORPERAZINE MALEATE 10 MG PO TABS
10.0000 mg | ORAL_TABLET | Freq: Three times a day (TID) | ORAL | 0 refills | Status: DC | PRN
Start: 2022-02-17 — End: 2022-02-22

## 2022-02-17 MED ORDER — ONDANSETRON 4 MG PO TBDP: 4.0000 mg | ORAL_TABLET | Freq: Three times a day (TID) | ORAL | 0 refills | Status: AC | PRN

## 2022-02-17 MED ORDER — POLYETHYLENE GLYCOL 3350 17 G PO PACK: 17.0000 g | PACK | Freq: Two times a day (BID) | ORAL | 0 refills | Status: AC | PRN

## 2022-02-17 NOTE — Discharge Summary (Signed)
Physician Discharge Summary   Tiffany Crockerlecia W Guymon  female DOB: May 29, 1961  WUJ:[id number]  PCP: Eddie DibblesAllison, James H, MD  Admit date: 02/07/2022 Discharge date: 02/17/2022  Admitted From: home (hotel) Disposition:  home (hotel) Marchia MeiersFiance is the caregiver.  He and daughter were updated on discharge plan prior to discharge.  Home Health: Yes CODE STATUS: Full code  Discharge Instructions     Discharge instructions   Complete by: As directed    You were low on potassium and needed supplementation.  You are prescribed potassium 20 mEq daily for 30 days.  Please follow up with your PCP to check potassium level and see if you need to continue taking supplement indefinitely. - -   No wound care   Complete by: As directed         Hospital Course:  For full details, please see H&P, progress notes, consult notes and ancillary notes.  Briefly,  Tiffany Crockerlecia W Butterbaugh is a 61 y.o. female with medical history significant for asthma, COPD on chronic 2L, bipolar 1 disorder, GERD, migraine, immobility at baseline who presented to the ER on with intractable nausea and vomiting for the last 8 days.    Pt had been seen in the ER couple of days prior for her nausea and vomiting were managed with antiemetics and hydration and therefore was thought to be stable for discharge given tolerating PO intake at that time.   In the ED during current admission, labs showed K 2.7, Mg 1.4.   CT abdomen/pelvis was non-acute but showed a dilated appendix without signs of appendicitis.  Gensurg consulted and ruled out appendicitis.    * Intractable nausea and vomiting- (present on admission) Present on admission, persistent for over a week per patient.  No abdominal pain other than abdominal wall muscles secondary to strain from vomiting.  No signs of dehydration. 2/14 EGD didn't show reason for N/V --Home Reglan stopped, was unhelpful and pt developed diarrhea, very low suspicion for motility issue. --Pt refused  scheduled zofran before meal-time and preferred IV compazine PRN.   --Prior to discharge, pt was tolerating oral intake.  Pt was discharged on oral compazine PRN as requested.   Inadequate oral intake --pt refused all supplements.   Hiatal hernia S/p Nissen fundoplication, intact   History of esophagitis Noted in chart review, PCP office note from 08/21/2021 and GI consult notes from admission here late October 2022.  Appears EGD at that time showed grade D esophagitis with an esophageal diverticulum and paraesophageal hernia with history of Nissen fundoplication with intact wrap.   --EGD this admission neg for esophagitis --pt has been refusing protonix   Obesity, Class III, BMI 40-49.9 (morbid obesity) (HCC)- (present on admission) Body mass index is 42.68 kg/m. Complicates overall care and prognosis.  Recommend lifestyle modifications including physical activity and diet for weight loss and overall long-term health.   Dyslipidemia- (present on admission) --resume home statin after discharge.   History of migraine Continue as needed Imitrex   Asthma and COPD, chronic- (present on admission) Asthma/COPD is stable without exacerbation. --cont home bronchodilator regimen as below   Bipolar 1 disorder (HCC)- (present on admission) Appears stable.   --Pt has been refusing Lamictal and Cymbalta.   Hyponatremia- (present on admission) Resolved with IV hydration.   Present on admission, mild with sodium 134.    Hypomagnesemia- (present on admission) --monitored and repleted with IV mag   Pressure injury of skin- (present on admission) Present admission.  I agree with the wound  description as outlined below. Frequent repositioning of patient. Local wound care and monitor closely   Pressure Injury 02/07/22 Buttocks Left Stage 2 -  Partial thickness loss of dermis presenting as a shallow open injury with a red, pink wound bed without slough. red and flaky (Active)  02/07/22 2238   Location: Buttocks  Location Orientation: Left  Staging: Stage 2 -  Partial thickness loss of dermis presenting as a shallow open injury with a red, pink wound bed without slough.  Wound Description (Comments): red and flaky  Present on Admission: Yes     Pressure Injury Buttocks Right Stage 2 -  Partial thickness loss of dermis presenting as a shallow open injury with a red, pink wound bed without slough. pink and flaky (Active)     Location: Buttocks  Location Orientation: Right  Staging: Stage 2 -  Partial thickness loss of dermis presenting as a shallow open injury with a red, pink wound bed without slough.  Wound Description (Comments): pink and flaky  Present on Admission: Yes     GERD (gastroesophageal reflux disease)- (present on admission) --pt refused PPI   Thrombocytopenia (HCC)- (present on admission) Of unclear significance, normal now.   Hypokalemia- (present on admission) --monitored and repleted PRN --Pt needed frequent potassium supplementation during hospitalization, so pt was prescribed potassium 20 mEq daily for 30 days.  Pt advised to follow up with PCP to check potassium level.    Discharge Diagnoses:  Principal Problem:   Intractable nausea and vomiting Active Problems:   Hypokalemia   Thrombocytopenia (HCC)   GERD (gastroesophageal reflux disease)   Pressure injury of skin   Hypomagnesemia   Hyponatremia   Bipolar 1 disorder (HCC)   Asthma, chronic   History of migraine   Dyslipidemia   Obesity, Class III, BMI 40-49.9 (morbid obesity) (HCC)   History of esophagitis   Hiatal hernia   Inadequate oral intake   30 Day Unplanned Readmission Risk Score    Flowsheet Row ED to Hosp-Admission (Current) from [facility]  30 Day Unplanned Readmission Risk Score (%) 38.19 Filed at 02/17/2022 0400       This score is the patient's risk of an unplanned readmission within 30 days of being discharged (0  -100%). The score is based on dignosis, age, lab data, medications, orders, and past utilization.   Low:  0-14.9   Medium: 15-21.9   High: 22-29.9   Extreme: 30 and above         Discharge Instructions:  Allergies as of 02/17/2022       Reactions   Dairy Aid [tilactase] Other (See Comments)   Exacerbates asthma   Egg [eggs Or Egg-derived Products] Other (See Comments)   Exacerbated asthma   Milk-related Compounds         Medication List     STOP taking these medications    metoCLOPramide 10 MG tablet Commonly known as: REGLAN       TAKE these medications    acetaminophen 325 MG tablet Commonly known as: TYLENOL Take 2 tablets (650 mg total) by mouth every 6 (six) hours as needed for mild pain (or Fever >/= 101).   atorvastatin 80 MG tablet Commonly known as: LIPITOR Take 80 mg by mouth daily.   diclofenac Sodium 1 % Gel Commonly known as: VOLTAREN Apply 4 g topically 4 (four) times daily.   DULoxetine 30 MG capsule Commonly known as: CYMBALTA Take 30 mg by mouth daily.   famotidine  20 MG tablet Commonly known as: PEPCID Take 1 tablet (20 mg total) by mouth 2 (two) times daily.   feeding supplement (KATE FARMS STANDARD 1.4) Liqd liquid Take 325 mLs by mouth 3 (three) times daily between meals.   Flovent HFA 110 MCG/ACT inhaler Generic drug: fluticasone Inhale 2 puffs into the lungs 2 (two) times daily.   folic acid 1 MG tablet Commonly known as: FOLVITE Take 5 tablets (5 mg total) by mouth daily.   lamoTRIgine 25 MG tablet Commonly known as: LAMICTAL Take 50 mg by mouth daily.   loratadine 10 MG tablet Commonly known as: CLARITIN Take 1 tablet (10 mg total) by mouth every evening.   multivitamin with minerals Tabs tablet Take 1 tablet by mouth daily.   ondansetron 4 MG disintegrating tablet Commonly known as: ZOFRAN-ODT Take 1 tablet (4 mg total) by mouth every 8 (eight) hours as needed for nausea or vomiting.   pantoprazole 40 MG  tablet Commonly known as: PROTONIX Take 40 mg by mouth 2 (two) times daily.   polyethylene glycol 17 g packet Commonly known as: MIRALAX / GLYCOLAX Take 17 g by mouth 2 (two) times daily as needed. What changed:  when to take this reasons to take this   potassium chloride SA 20 MEQ tablet Commonly known as: KLOR-CON M Take 1 tablet (20 mEq total) by mouth daily.   prochlorperazine 10 MG tablet Commonly known as: COMPAZINE Take 1 tablet (10 mg total) by mouth every 8 (eight) hours as needed for nausea or vomiting.   Spiriva HandiHaler 18 MCG inhalation capsule Generic drug: tiotropium 1 capsule at bedtime.   SUMAtriptan 100 MG tablet Commonly known as: IMITREX Take 100 mg by mouth daily as needed.   traZODone 100 MG tablet Commonly known as: DESYREL Take 1 tablet (100 mg total) by mouth at bedtime as needed for sleep.   Vitamin D (Ergocalciferol) 1.25 MG (50000 UNIT) Caps capsule Commonly known as: DRISDOL Take 1 capsule (50,000 Units total) by mouth every 7 (seven) days.         Follow-up Information     Eddie Dibbles, MD Follow up in 1 week(s).   Specialty: Family Medicine Contact information: [address] [place] [address] [phone]                 Allergies  Allergen Reactions   Dairy Aid [Tilactase] Other (See Comments)    Exacerbates asthma   Egg [Eggs Or Egg-Derived Products] Other (See Comments)    Exacerbated asthma   Milk-Related Compounds      The results of significant diagnostics from this hospitalization (including imaging, microbiology, ancillary and laboratory) are listed below for reference.   Consultations:   Procedures/Studies: CT Abdomen Pelvis W Contrast  Result Date: 02/07/2022 CLINICAL DATA:  Nausea and vomiting for 2 days. EXAM: CT ABDOMEN AND PELVIS WITH CONTRAST TECHNIQUE: Multidetector CT imaging of the abdomen and pelvis was performed using the standard protocol following bolus administration of  intravenous contrast. RADIATION DOSE REDUCTION: This exam was performed according to the departmental dose-optimization program which includes automated exposure control, adjustment of the mA and/or kV according to patient size and/or use of iterative reconstruction technique. CONTRAST:  OMNIPAQUE IOHEXOL 300 MG/ML  SOLN COMPARISON:  Abdominopelvic CT 08/12/2021. FINDINGS: Lower chest: Clear lung bases. No significant pleural or pericardial effusion. Moderate size hiatal hernia appears similar to the previous study, with possible associated wall thickening. Hepatobiliary: The liver is normal in density without suspicious focal abnormality.  Probable dependent sludge in the gallbladder lumen. No evidence of gallstones, gallbladder wall thickening or surrounding inflammation. No evidence of biliary ductal dilatation. Pancreas: Fatty replaced. No evidence of ductal dilatation or surrounding inflammation. Spleen: Normal in size without focal abnormality. Adrenals/Urinary Tract: Both adrenal glands appear normal. The kidneys appear normal without evidence of urinary tract calculus, suspicious lesion or hydronephrosis. No bladder abnormalities are seen. Stomach/Bowel: No enteric contrast administered. As above, moderate-size hiatal hernia. The stomach otherwise appears unremarkable for its degree of distension. No evidence of bowel wall thickening, distention or surrounding inflammatory change. Prior study suggested previous appendectomy. Currently, there is a mildly prominent tubular structure projecting inferiorly from the cecal tip which appears blind-ending and likely reflects the appendix. This measures up to 8 mm in diameter, but demonstrates no surrounding inflammation. Vascular/Lymphatic: There are no enlarged abdominal or pelvic lymph nodes. No acute vascular findings. Scattered retroperitoneal phleboliths. Reproductive: Hysterectomy. Stable appearance of the left ovary. No suspicious adnexal findings.  Other: No evidence of abdominal wall mass or hernia. No ascites. Musculoskeletal: No acute or significant osseous findings. Generalized muscular atrophy. IMPRESSION: 1. No definite acute findings. Apparent prominence of the appendix without surrounding inflammatory changes to suggest acute appendicitis in this patient without leukocytosis. Recommend clinical follow up. 2. Moderate size hiatal hernia.  No evidence of bowel obstruction. 3. Previous hysterectomy. Electronically Signed   By: Carey Bullocks M.D.   On: 02/07/2022 16:20      Labs: BNP (last 3 results) No results for input(s): BNP in the last 8760 hours. Basic Metabolic Panel: Recent Labs  Lab 02/13/22 0559 02/14/22 0524 02/15/22 0546 02/16/22 0611 02/17/22 0253  NA 135 136 137 136 135  K 4.2 4.3 3.6 3.5 3.2*  CL 103 105 105 103 101  CO2 30 28 29 29 30   GLUCOSE 94 114* 82 82 78  BUN <5* <5* <5* <5* <5*  CREATININE 0.41* 0.57 0.68 0.52 0.49  CALCIUM 8.0* 8.1* 8.0* 8.1* 8.1*  MG 1.6* 2.1 2.0 1.7 1.8   Liver Function Tests: No results for input(s): AST, ALT, ALKPHOS, BILITOT, PROT, ALBUMIN in the last 168 hours. No results for input(s): LIPASE, AMYLASE in the last 168 hours. No results for input(s): AMMONIA in the last 168 hours. CBC: Recent Labs  Lab 02/13/22 0559 02/14/22 0524 02/15/22 0546 02/16/22 0611 02/17/22 0253  WBC 4.7 5.3 3.7* 3.7* 4.6  HGB 9.3* 9.1* 8.3* 8.7* 9.0*  HCT 28.5* 28.4* 25.3* 27.2* 27.5*  MCV 87.2 86.1 85.8 85.0 85.4  PLT 187 187 182 177 185   Cardiac Enzymes: No results for input(s): CKTOTAL, CKMB, CKMBINDEX, TROPONINI in the last 168 hours. BNP: Invalid input(s): POCBNP CBG: Recent Labs  Lab 02/11/22 1658  GLUCAP 78   D-Dimer No results for input(s): DDIMER in the last 72 hours. Hgb A1c No results for input(s): HGBA1C in the last 72 hours. Lipid Profile No results for input(s): CHOL, HDL, LDLCALC, TRIG, CHOLHDL, LDLDIRECT in the last 72 hours. Thyroid function studies No  results for input(s): TSH, T4TOTAL, T3FREE, THYROIDAB in the last 72 hours.  Invalid input(s): FREET3 Anemia work up No results for input(s): VITAMINB12, FOLATE, FERRITIN, TIBC, IRON, RETICCTPCT in the last 72 hours. Urinalysis    Component Value Date/Time   COLORURINE YELLOW 02/05/2022 1420   APPEARANCEUR CLEAR (A) 02/05/2022 1420   APPEARANCEUR Clear 05/14/2013 1809   LABSPEC 1.025 02/05/2022 1420   LABSPEC 1.013 05/14/2013 1809   PHURINE 6.0 02/05/2022 1420   GLUCOSEU NEGATIVE 02/05/2022 1420  GLUCOSEU Negative 05/14/2013 1809   HGBUR NEGATIVE 02/05/2022 1420   BILIRUBINUR LARGE (A) 02/05/2022 1420   BILIRUBINUR Negative 05/14/2013 1809   KETONESUR 40 (A) 02/05/2022 1420   PROTEINUR 100 (A) 02/05/2022 1420   NITRITE NEGATIVE 02/05/2022 1420   LEUKOCYTESUR NEGATIVE 02/05/2022 1420   LEUKOCYTESUR Negative 05/14/2013 1809   Sepsis Labs Invalid input(s): PROCALCITONIN,  WBC,  LACTICIDVEN Microbiology Recent Results (from the past 240 hour(s))  Resp Panel by RT-PCR (Flu A&B, Covid) Nasopharyngeal Swab     Status: None   Collection Time: 02/07/22  7:40 PM   Specimen: Nasopharyngeal Swab; Nasopharyngeal(NP) swabs in vial transport medium  Result Value Ref Range Status   SARS Coronavirus 2 by RT PCR NEGATIVE NEGATIVE Final    Comment: (NOTE) SARS-CoV-2 target nucleic acids are NOT DETECTED.  The SARS-CoV-2 RNA is generally detectable in upper respiratory specimens during the acute phase of infection. The lowest concentration of SARS-CoV-2 viral copies this assay can detect is 138 copies/mL. A negative result does not preclude SARS-Cov-2 infection and should not be used as the sole basis for treatment or other patient management decisions. A negative result may occur with  improper specimen collection/handling, submission of specimen other than nasopharyngeal swab, presence of viral mutation(s) within the areas targeted by this assay, and inadequate number of  viral copies(<138 copies/mL). A negative result must be combined with clinical observations, patient history, and epidemiological information. The expected result is Negative.  Fact Sheet for Patients:  BloggerCourse.com  Fact Sheet for Healthcare Providers:  SeriousBroker.it  This test is no t yet approved or cleared by the [place] FDA and  has been authorized for detection and/or diagnosis of SARS-CoV-2 by FDA under an Emergency Use Authorization (EUA). This EUA will remain  in effect (meaning this test can be used) for the duration of the COVID-19 declaration under Section 564(b)(1) of the Act, 21 U.S.C.section 360bbb-3(b)(1), unless the authorization is terminated  or revoked sooner.       Influenza A by PCR NEGATIVE NEGATIVE Final   Influenza B by PCR NEGATIVE NEGATIVE Final    Comment: (NOTE) The Xpert Xpress SARS-CoV-2/FLU/RSV plus assay is intended as an aid in the diagnosis of influenza from Nasopharyngeal swab specimens and should not be used as a sole basis for treatment. Nasal washings and aspirates are unacceptable for Xpert Xpress SARS-CoV-2/FLU/RSV testing.  Fact Sheet for Patients: BloggerCourse.com  Fact Sheet for Healthcare Providers: SeriousBroker.it  This test is not yet approved or cleared by the [place] FDA and has been authorized for detection and/or diagnosis of SARS-CoV-2 by FDA under an Emergency Use Authorization (EUA). This EUA will remain in effect (meaning this test can be used) for the duration of the COVID-19 declaration under Section 564(b)(1) of the Act, 21 U.S.C. section 360bbb-3(b)(1), unless the authorization is terminated or revoked.  Performed at [facility], [address] [place], [place] [address]      Total time spend on discharging this patient, including the last patient exam, discussing the hospital  stay, instructions for ongoing care as it relates to all pertinent caregivers, as well as preparing the medical discharge records, prescriptions, and/or referrals as applicable, is 50 minutes.    Darlin Priestly, MD  [facility] 02/17/2022, 7:50 AM

## 2022-02-17 NOTE — Progress Notes (Signed)
Reached out to [facility] and EMS pending arrival and pt is ready and waiting

## 2022-02-17 NOTE — TOC Progression Note (Signed)
Transition of Care [facility]) - Progression Note    Patient Details  Name: CHRISTABELLA ALVIRA MRN: [id number] Date of Birth: 03/04/1961  Transition of Care [facility]) CM/SW Contact  Bing Quarry, RN Phone Number: 02/17/2022, 9:31 AM  Clinical Narrative:  Per CM notes of 02/16/22: Fax was received this am and printed to Unit for provider to sign for Medicaid expedited private care services. Per CM notes 02/16/22 pending call back from [facility], as well as DSS case worker. No designated party listed and no HCPOA or guardianship listed in chart. Put in a call to oncall APS/DSS to call back to update on DC plan over the weekend. Updated provider. Will monitor through DC with transportation needs. Gabriel Cirri RN CM    Expected Discharge Plan: Skilled Nursing Facility Barriers to Discharge: Continued Medical Work up, SNF Pending bed offer  Expected Discharge Plan and Services Expected Discharge Plan: Skilled Nursing Facility     Post Acute Care Choice: Skilled Nursing Facility Living arrangements for the past 2 months: Hotel/Motel Expected Discharge Date: 02/17/22               DME Arranged: N/A DME Agency: NA       HH Arranged: NA HH Agency: NA         Social Determinants of Health (SDOH) Interventions    Readmission Risk Interventions Readmission Risk Prevention Plan 11/08/2021  Transportation Screening Complete  PCP or Specialist Appt within 5-7 Days Complete  Home Care Screening Complete  Medication Review (RN CM) Complete  Some recent data might be hidden

## 2022-02-17 NOTE — TOC Transition Note (Addendum)
Transition of Care [facility]) - CM/SW Discharge Note   Patient Details  Name: Tiffany [name] MRN: [id number] Date of Birth: 07-10-1961  Transition of Care [facility]) CM/SW Contact:  Bing Quarry, RN Phone Number: 02/17/2022, 2:37 PM   Clinical Narrative:   2/18: Patient being discharged and transported via [facility] to [facility] [address] [place], where patient has resided for last year. Medicaid PCS services form signed and faxed back to [facility]. [facility] for PT/RN/SLP/Aide services pending per prior CM notes of 2/17: "Spoke to [place] at [facility] and faxed orders for review. Noted on fax cover sheet, plan for discharge tomorrow", orders in chart. DSS on call for APS contacted to update on discharge today but has not returned RN CM call as of discharge. Regular DDS case worker is Jennelle Human, who was notified Friday per CM note. Provider aware of these statuses.  Transport and forms printed to [employer]. Provider indicates patient has oxygen set up for chronic use pta. Gabriel Cirri RN CM    Final next level of care: Other (comment) Edgar Frisk [address] [place]) Barriers to Discharge: Barriers Resolved   Patient Goals and CMS Choice        Discharge Placement                       Discharge Plan and Services     Post Acute Care Choice: Skilled Nursing Facility          DME Arranged: N/A DME Agency: NA       HH Arranged: PT, RN, Speech Therapy, Nurse's Aide (PT    SLP   RN   Home Health Aide) HH Agency: [facility] (Pending [facility] approval, provider aware.) Date HH Agency Contacted: 02/16/22   Representative spoke with at [facility] Agency: Lucendia Herrlich at [facility] and faxed orders for review (2/17) (Per CM notes on 02/16/22, spoke to [facility])  Social Determinants of Health (SDOH) Interventions     Readmission Risk Interventions Readmission Risk Prevention Plan 11/08/2021  Transportation Screening Complete  PCP or  Specialist Appt within 5-7 Days Complete  Home Care Screening Complete  Medication Review (RN CM) Complete  Some recent data might be hidden

## 2022-02-17 NOTE — Plan of Care (Signed)
°  Problem: Clinical Measurements: Goal: Ability to maintain clinical measurements within normal limits will improve Outcome: Progressing Goal: Will remain free from infection Outcome: Progressing Goal: Diagnostic test results will improve Outcome: Progressing Goal: Respiratory complications will improve Outcome: Progressing Goal: Cardiovascular complication will be avoided Outcome: Progressing   Problem: Pain Managment: Goal: General experience of comfort will improve Outcome: Progressing   Pt is alert and oriented x4. V/S stable. Denies any pain. Complained of nausea; Compazine IV given. Pt refused to take her oral medications because she was nauseas but pt was able to drink and eat this shift.

## 2022-02-17 NOTE — Progress Notes (Signed)
Pt refused all morning medication today. Family has been updated several time today about discharge.

## 2022-02-18 ENCOUNTER — Encounter: Payer: Self-pay | Admitting: Emergency Medicine

## 2022-02-18 ENCOUNTER — Other Ambulatory Visit: Payer: Self-pay

## 2022-02-18 ENCOUNTER — Emergency Department
Admission: EM | Admit: 2022-02-18 | Discharge: 2022-02-18 | Disposition: A | Discharge status: Home / Self Care | Payer: Medicaid Other | Attending: Emergency Medicine | Admitting: Emergency Medicine

## 2022-02-18 DIAGNOSIS — Z79899 Other long term (current) drug therapy: Secondary | ICD-10-CM | POA: Insufficient documentation

## 2022-02-18 DIAGNOSIS — I10 Essential (primary) hypertension: Secondary | ICD-10-CM | POA: Diagnosis not present

## 2022-02-18 DIAGNOSIS — J45998 Other asthma: Secondary | ICD-10-CM | POA: Insufficient documentation

## 2022-02-18 DIAGNOSIS — R220 Localized swelling, mass and lump, head: Secondary | ICD-10-CM | POA: Insufficient documentation

## 2022-02-18 DIAGNOSIS — J449 Chronic obstructive pulmonary disease, unspecified: Secondary | ICD-10-CM | POA: Insufficient documentation

## 2022-02-18 HISTORY — DX: Cerebral infarction, unspecified: I63.9

## 2022-02-18 MED ORDER — FAMOTIDINE 20 MG PO TABS: 20.0000 mg | ORAL_TABLET | Freq: Two times a day (BID) | ORAL | 0 refills | Status: DC

## 2022-02-18 MED ORDER — METHYLPREDNISOLONE SODIUM SUCC 125 MG IJ SOLR
60.0000 mg | Freq: Once | INTRAMUSCULAR | Status: AC
  Administered 2022-02-18: 60 mg via INTRAVENOUS
  Filled 2022-02-18: qty 2

## 2022-02-18 MED ORDER — ONDANSETRON HCL 4 MG/2ML IJ SOLN
4.0000 mg | Freq: Once | INTRAMUSCULAR | Status: AC
  Administered 2022-02-18: 4 mg via INTRAVENOUS
  Filled 2022-02-18: qty 2

## 2022-02-18 MED ORDER — DIPHENHYDRAMINE HCL 50 MG/ML IJ SOLN
25.0000 mg | Freq: Once | INTRAMUSCULAR | Status: AC
  Administered 2022-02-18: 25 mg via INTRAVENOUS
  Filled 2022-02-18: qty 1

## 2022-02-18 MED ORDER — FAMOTIDINE IN NACL 20-0.9 MG/50ML-% IV SOLN
20.0000 mg | Freq: Once | INTRAVENOUS | Status: AC
  Administered 2022-02-18: 20 mg via INTRAVENOUS
  Filled 2022-02-18: qty 50

## 2022-02-18 MED ORDER — DIPHENHYDRAMINE HCL 12.5 MG/5ML PO ELIX: 25.0000 mg | ORAL_SOLUTION | Freq: Four times a day (QID) | ORAL | 0 refills | Status: AC | PRN

## 2022-02-18 NOTE — ED Provider Notes (Signed)
[facility] Provider Note    Event Date/Time   First MD Initiated Contact with Patient 02/18/22 1112     (approximate)   History   Facial Swelling   HPI  Tiffany [name] is a 61 y.o. female   with past medical history of bipolar disorder, COPD, multiple admissions for nausea and vomiting presents with facial swelling.  Patient was just discharged from the hospital yesterday after an admission for about a week for intractable nausea and vomiting.  When she woke up this morning she felt like her face was swollen and feels like her tongue and mouth are swollen as well.  Denies difficulty swallowing but does have some pain with swallowing.  She denies difficulty breathing rash or any ocular irritation.  Patient is not on a ACE inhibitor or ARB.  Patient tells me she had swelling of her mouth previously when she had a tongue ring but this has not happened since.     Past Medical History:  Diagnosis Date   Asthma    Bipolar 1 disorder ([place])    COPD (chronic obstructive pulmonary disease) (HCC)    CVA (cerebral vascular accident) ([place])    Enlarged heart    GERD (gastroesophageal reflux disease)    Migraines     Patient Active Problem List   Diagnosis Date Noted   Inadequate oral intake 02/13/2022   History of esophagitis 02/12/2022   Hiatal hernia 02/12/2022   Obesity, Class III, BMI 40-49.9 (morbid obesity) ([place]) 02/11/2022   Pressure injury of skin 02/08/2022   Hypomagnesemia 02/08/2022   Hyponatremia 02/08/2022   Bipolar 1 disorder ([place]) 02/08/2022   Asthma, chronic 02/08/2022   History of migraine 02/08/2022   Dyslipidemia 02/08/2022   Intractable nausea and vomiting 02/07/2022   Hypoxia    Acute respiratory failure with hypoxia and hypercarbia (HCC)    Ambulatory dysfunction    Primary hypertension    Encephalopathy 11/13/2021   Hypokalemia 10/29/2021   Frequent falls 10/29/2021   Refractory nausea and vomiting 10/29/2021   Thrombocytopenia  ([place]) 10/29/2021   GERD (gastroesophageal reflux disease) 10/29/2021     Physical Exam  Triage Vital Signs: ED Triage Vitals  Enc Vitals Group     BP 02/18/22 1128 99/87     Pulse Rate 02/18/22 1128 80     Resp 02/18/22 1128 18     Temp 02/18/22 1128 98.6 F (37 C)     Temp Source 02/18/22 1128 Oral     SpO2 02/18/22 1128 100 %     Weight 02/18/22 1048 264 lb 8.8 oz (120 kg)     Height 02/18/22 1048 5\' 9"  (1.753 m)     Head Circumference --      Peak Flow --      Pain Score 02/18/22 1048 0     Pain Loc --      Pain Edu? --      Excl. in [place]? --     Most recent vital signs: Vitals:   02/18/22 1144 02/18/22 1517  BP: 114/75 129/78  Pulse: 85 87  Resp: 18 20  Temp:  98.6 F (37 C)  SpO2: 100% 100%     General: Awake, no distress.  CV:  Good peripheral perfusion.  Resp:  Normal effort.  Abd:  No distention.  Neuro:             Awake, Alert, Oriented x 3  Other:  Tongue appears mildly erythematous, possibly somewhat enlarged but difficult without  prior comparison, no significant edema of the posterior oropharynx lips or face, buccal mucosa somewhat erythematous as well no obvious lesions or thrush   ED Results / Procedures / Treatments  Labs (all labs ordered are listed, but only abnormal results are displayed) Labs Reviewed - No data to display   EKG     RADIOLOGY    PROCEDURES:  Critical Care performed: No  Procedures  The patient is on the cardiac monitor to evaluate for evidence of arrhythmia and/or significant heart rate changes.   MEDICATIONS ORDERED IN ED: Medications  famotidine (PEPCID) IVPB 20 mg premix (0 mg Intravenous Stopped 02/18/22 1353)  diphenhydrAMINE (BENADRYL) injection 25 mg (25 mg Intravenous Given 02/18/22 1242)  methylPREDNISolone sodium succinate (SOLU-MEDROL) 125 mg/2 mL injection 60 mg (60 mg Intravenous Given 02/18/22 1243)  ondansetron (ZOFRAN) injection 4 mg (4 mg Intravenous Given 02/18/22 1242)     IMPRESSION / MDM  / ASSESSMENT AND PLAN / ED COURSE  I reviewed the triage vital signs and the nursing notes.                              Differential diagnosis includes, but is not limited to, angioedema, allergic reaction, prodromal SJS, mucosal irritation  Patient is a 61 year old female who I seen in the emergency department before who has multiple presentations for intractable nausea and vomiting recently discharged from the hospital yesterday presents with concern for facial and mouth swelling.  Patient woke up today feeling like her face and tongue were swollen.  She has no history of angioedema.  No new medications.  On exam patient does not appear to be in any respiratory distress she has no stridor is tolerating her secretions.  Her tongue is somewhat erythematous as is her buccal mucosa she has no significant swelling of the posterior oropharynx.  Tongue may be somewhat swollen although it is not completely clear to me as I have no baseline to compare to.  She has no swelling under the tongue, no obvious facial swelling.  Differential includes allergic versus angioedema.  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  Will place an IV and treat with Pepcid Benadryl and steroids and continue to monitor.  Patient tells me she is not able to take pills currently so we will stick to IV.  3:22 PM On reassessment patient with unchanged oral exam.  We will continue to monitor  3:22 PM On reassessment patient's tongue looks to be stable, may be somewhat improved?  Patient feels that it is subjectively gone down.  Has certainly not progressed.  She is not in distress, watching TV on her phone.  She is tolerating her secretions.  She has no new symptoms at this time.  Overall unclear what the etiology of her symptoms are and I am still not convinced that there was any significant tongue swelling to begin with.  At this point she has been observed in the  ED for about 4 hours without any progression.  I think she is appropriate for discharge.  Discussed with her husband and herself reasons to return to the ED including lip swelling progression of the tongue swelling or difficulty swallowing or breathing.  She is stable for discharge.      FINAL CLINICAL IMPRESSION(S) / ED DIAGNOSES   Final diagnoses:  None     Rx / DC Orders  ED Discharge Orders     None        Note:  This document was prepared using Dragon voice recognition software and may include unintentional dictation errors.   Rada Hay, MD 02/18/22 1525

## 2022-02-18 NOTE — ED Triage Notes (Signed)
Pt in vis EMS from [facility] with c/o facial swelling. Denies SOB, hives, rash, etc. HR 87, 100% RA. Pt always on 2L [place], 151/81, Hx of asthma and COPD

## 2022-02-18 NOTE — ED Triage Notes (Signed)
Pt reports when she woke up her face was swollen. Pt able to speak in complete sentences, no swelling noted to her throat, no distress noted at all. Pt denies new meds, soaps, clothes, foods or other things.

## 2022-02-18 NOTE — ED Notes (Signed)
Called [facility] for transport back pt residence

## 2022-02-18 NOTE — Discharge Instructions (Addendum)
I am not sure exactly why your tongue was somewhat swollen today.  It could be allergic reaction or angioedema.  Please take the Benadryl and pepcid every 4 hours as needed.  You have any progression of the swelling, swelling of your face or lips or having difficulty swallowing or breathing, please return to the emergency department immediately

## 2022-02-21 ENCOUNTER — Emergency Department: Payer: Medicaid Other

## 2022-02-21 ENCOUNTER — Encounter: Payer: Self-pay | Admitting: Internal Medicine

## 2022-02-21 ENCOUNTER — Observation Stay
Admission: EM | Admit: 2022-02-21 | Discharge: 2022-02-22 | Disposition: A | Discharge status: Home / Self Care | Payer: Medicaid Other | Attending: Internal Medicine | Admitting: Internal Medicine

## 2022-02-21 ENCOUNTER — Other Ambulatory Visit: Payer: Self-pay

## 2022-02-21 DIAGNOSIS — Z20822 Contact with and (suspected) exposure to covid-19: Secondary | ICD-10-CM | POA: Diagnosis not present

## 2022-02-21 DIAGNOSIS — T783XXA Angioneurotic edema, initial encounter: Principal | ICD-10-CM | POA: Insufficient documentation

## 2022-02-21 DIAGNOSIS — T783XXD Angioneurotic edema, subsequent encounter: Secondary | ICD-10-CM

## 2022-02-21 DIAGNOSIS — E669 Obesity, unspecified: Secondary | ICD-10-CM

## 2022-02-21 DIAGNOSIS — R220 Localized swelling, mass and lump, head: Secondary | ICD-10-CM

## 2022-02-21 DIAGNOSIS — I639 Cerebral infarction, unspecified: Secondary | ICD-10-CM

## 2022-02-21 DIAGNOSIS — E876 Hypokalemia: Secondary | ICD-10-CM | POA: Diagnosis not present

## 2022-02-21 DIAGNOSIS — J45909 Unspecified asthma, uncomplicated: Secondary | ICD-10-CM | POA: Insufficient documentation

## 2022-02-21 DIAGNOSIS — Z5900 Homelessness unspecified: Secondary | ICD-10-CM | POA: Insufficient documentation

## 2022-02-21 DIAGNOSIS — R2990 Unspecified symptoms and signs involving the nervous system: Secondary | ICD-10-CM | POA: Diagnosis present

## 2022-02-21 DIAGNOSIS — G43909 Migraine, unspecified, not intractable, without status migrainosus: Secondary | ICD-10-CM | POA: Insufficient documentation

## 2022-02-21 DIAGNOSIS — F32A Depression, unspecified: Secondary | ICD-10-CM | POA: Diagnosis present

## 2022-02-21 DIAGNOSIS — Z8673 Personal history of transient ischemic attack (TIA), and cerebral infarction without residual deficits: Secondary | ICD-10-CM | POA: Insufficient documentation

## 2022-02-21 DIAGNOSIS — Z79899 Other long term (current) drug therapy: Secondary | ICD-10-CM | POA: Insufficient documentation

## 2022-02-21 DIAGNOSIS — T7840XA Allergy, unspecified, initial encounter: Secondary | ICD-10-CM | POA: Diagnosis present

## 2022-02-21 DIAGNOSIS — F319 Bipolar disorder, unspecified: Secondary | ICD-10-CM | POA: Diagnosis present

## 2022-02-21 DIAGNOSIS — Z7951 Long term (current) use of inhaled steroids: Secondary | ICD-10-CM | POA: Insufficient documentation

## 2022-02-21 DIAGNOSIS — E785 Hyperlipidemia, unspecified: Secondary | ICD-10-CM | POA: Diagnosis present

## 2022-02-21 DIAGNOSIS — J449 Chronic obstructive pulmonary disease, unspecified: Secondary | ICD-10-CM | POA: Diagnosis not present

## 2022-02-21 HISTORY — DX: Sleep apnea, unspecified: G47.30

## 2022-02-21 LAB — CBC
HCT: 28.8 % — ABNORMAL LOW (ref 36.0–46.0)
Hemoglobin: 9.4 g/dL — ABNORMAL LOW (ref 12.0–15.0)
MCH: 27.8 pg (ref 26.0–34.0)
MCHC: 32.6 g/dL (ref 30.0–36.0)
MCV: 85.2 fL (ref 80.0–100.0)
Platelets: 186 10*3/uL (ref 150–400)
RBC: 3.38 MIL/uL — ABNORMAL LOW (ref 3.87–5.11)
RDW: 15.2 % (ref 11.5–15.5)
WBC: 5.4 10*3/uL (ref 4.0–10.5)
nRBC: 0 % (ref 0.0–0.2)

## 2022-02-21 LAB — BASIC METABOLIC PANEL
Anion gap: 10 (ref 5–15)
BUN: 11 mg/dL (ref 6–20)
CO2: 28 mmol/L (ref 22–32)
Calcium: 8.1 mg/dL — ABNORMAL LOW (ref 8.9–10.3)
Chloride: 97 mmol/L — ABNORMAL LOW (ref 98–111)
Creatinine, Ser: 0.56 mg/dL (ref 0.44–1.00)
GFR, Estimated: >60 mL/min (ref >60)
Glucose, Bld: 73 mg/dL (ref 70–99)
Potassium: 3.2 mmol/L — ABNORMAL LOW (ref 3.5–5.1)
Sodium: 135 mmol/L (ref 135–145)

## 2022-02-21 LAB — RESP PANEL BY RT-PCR (FLU A&B, COVID) ARPGX2
Influenza A by PCR: NEGATIVE
Influenza B by PCR: NEGATIVE
SARS Coronavirus 2 by RT PCR: NEGATIVE

## 2022-02-21 LAB — MAGNESIUM: Magnesium: 1.6 mg/dL — ABNORMAL LOW (ref 1.7–2.4)

## 2022-02-21 MED ORDER — POTASSIUM CHLORIDE 10 MEQ/100ML IV SOLN
10.0000 meq | INTRAVENOUS | Status: AC
  Administered 2022-02-21 – 2022-02-22 (×2): 10 meq via INTRAVENOUS
  Filled 2022-02-21 (×2): qty 100

## 2022-02-21 MED ORDER — ACETAMINOPHEN 325 MG PO TABS: 650.0000 mg | ORAL_TABLET | Freq: Four times a day (QID) | ORAL | Status: DC | PRN

## 2022-02-21 MED ORDER — ALBUTEROL SULFATE (2.5 MG/3ML) 0.083% IN NEBU: 3.0000 mL | INHALATION_SOLUTION | RESPIRATORY_TRACT | Status: DC | PRN

## 2022-02-21 MED ORDER — ONDANSETRON HCL 4 MG/2ML IJ SOLN
4.0000 mg | Freq: Once | INTRAMUSCULAR | Status: AC
  Administered 2022-02-21: 4 mg via INTRAVENOUS
  Filled 2022-02-21: qty 2

## 2022-02-21 MED ORDER — TIOTROPIUM BROMIDE MONOHYDRATE 18 MCG IN CAPS
1.0000 | ORAL_CAPSULE | Freq: Every day | RESPIRATORY_TRACT | Status: DC
  Administered 2022-02-22: 18 ug via RESPIRATORY_TRACT
  Filled 2022-02-21: qty 5

## 2022-02-21 MED ORDER — FAMOTIDINE IN NACL 20-0.9 MG/50ML-% IV SOLN
20.0000 mg | Freq: Two times a day (BID) | INTRAVENOUS | Status: DC
  Administered 2022-02-21: 20 mg via INTRAVENOUS
  Filled 2022-02-21 (×3): qty 50

## 2022-02-21 MED ORDER — METHYLPREDNISOLONE SODIUM SUCC 125 MG IJ SOLR
125.0000 mg | Freq: Once | INTRAMUSCULAR | Status: AC
  Administered 2022-02-21: 125 mg via INTRAVENOUS
  Filled 2022-02-21: qty 2

## 2022-02-21 MED ORDER — POTASSIUM CHLORIDE 20 MEQ PO PACK
40.0000 meq | PACK | Freq: Every day | ORAL | Status: DC
  Administered 2022-02-22: 40 meq via ORAL
  Filled 2022-02-21: qty 2

## 2022-02-21 MED ORDER — SODIUM CHLORIDE 0.9 % IV BOLUS
1000.0000 mL | Freq: Once | INTRAVENOUS | Status: AC
Start: 2022-02-21 — End: 2022-02-21
  Administered 2022-02-21: 1000 mL via INTRAVENOUS

## 2022-02-21 MED ORDER — POTASSIUM CHLORIDE 10 MEQ/100ML IV SOLN: 10.0000 meq | INTRAVENOUS | Status: AC

## 2022-02-21 MED ORDER — BUDESONIDE 0.5 MG/2ML IN SUSP
2.0000 mL | Freq: Two times a day (BID) | RESPIRATORY_TRACT | Status: DC
  Administered 2022-02-21 – 2022-02-22 (×2): 0.5 mg via RESPIRATORY_TRACT
  Filled 2022-02-21 (×2): qty 2

## 2022-02-21 MED ORDER — FAMOTIDINE IN NACL 20-0.9 MG/50ML-% IV SOLN
20.0000 mg | Freq: Once | INTRAVENOUS | Status: AC
  Administered 2022-02-21: 20 mg via INTRAVENOUS
  Filled 2022-02-21: qty 50

## 2022-02-21 MED ORDER — LORATADINE 10 MG PO TABS: 10.0000 mg | ORAL_TABLET | Freq: Every evening | ORAL | Status: DC

## 2022-02-21 MED ORDER — TRAZODONE HCL 100 MG PO TABS: 100.0000 mg | ORAL_TABLET | Freq: Every evening | ORAL | Status: DC | PRN

## 2022-02-21 MED ORDER — ATORVASTATIN CALCIUM 20 MG PO TABS
80.0000 mg | ORAL_TABLET | Freq: Every day | ORAL | Status: DC
  Administered 2022-02-22: 80 mg via ORAL
  Filled 2022-02-21: qty 4

## 2022-02-21 MED ORDER — IOHEXOL 300 MG/ML  SOLN
75.0000 mL | Freq: Once | INTRAMUSCULAR | Status: AC | PRN
  Administered 2022-02-21: 75 mL via INTRAVENOUS

## 2022-02-21 MED ORDER — ACETAMINOPHEN 650 MG RE SUPP: 650.0000 mg | Freq: Four times a day (QID) | RECTAL | Status: DC | PRN

## 2022-02-21 MED ORDER — DIPHENHYDRAMINE HCL 50 MG/ML IJ SOLN
50.0000 mg | Freq: Once | INTRAMUSCULAR | Status: AC
Start: 2022-02-21 — End: 2022-02-21
  Administered 2022-02-21: 50 mg via INTRAVENOUS
  Filled 2022-02-21: qty 1

## 2022-02-21 MED ORDER — ONDANSETRON HCL 4 MG/2ML IJ SOLN
4.0000 mg | Freq: Three times a day (TID) | INTRAMUSCULAR | Status: DC | PRN
Start: 2022-02-21 — End: 2022-02-22

## 2022-02-21 MED ORDER — DIPHENHYDRAMINE HCL 50 MG/ML IJ SOLN
25.0000 mg | Freq: Two times a day (BID) | INTRAMUSCULAR | Status: DC
  Administered 2022-02-21: 25 mg via INTRAVENOUS
  Filled 2022-02-21: qty 1

## 2022-02-21 MED ORDER — HEPARIN SODIUM (PORCINE) 5000 UNIT/ML IJ SOLN: 5000.0000 [IU] | Freq: Three times a day (TID) | INTRAMUSCULAR | Status: DC

## 2022-02-21 MED ORDER — METHYLPREDNISOLONE SODIUM SUCC 125 MG IJ SOLR
60.0000 mg | Freq: Two times a day (BID) | INTRAMUSCULAR | Status: DC
  Administered 2022-02-21: 60 mg via INTRAVENOUS
  Filled 2022-02-21: qty 2

## 2022-02-21 MED ORDER — DULOXETINE HCL 30 MG PO CPEP
30.0000 mg | ORAL_CAPSULE | Freq: Every day | ORAL | Status: DC
Start: 2022-02-21 — End: 2022-02-22
  Administered 2022-02-22: 30 mg via ORAL
  Filled 2022-02-21 (×2): qty 1

## 2022-02-21 MED ORDER — SODIUM CHLORIDE 0.9 % IV SOLN: INTRAVENOUS | Status: DC

## 2022-02-21 MED ORDER — ENOXAPARIN SODIUM 60 MG/0.6ML IJ SOSY
0.5000 mg/kg | PREFILLED_SYRINGE | INTRAMUSCULAR | Status: DC
Start: 2022-02-21 — End: 2022-02-22
  Administered 2022-02-21: 47.5 mg via SUBCUTANEOUS
  Filled 2022-02-21: qty 0.6

## 2022-02-21 MED ORDER — POLYETHYLENE GLYCOL 3350 17 G PO PACK: 17.0000 g | PACK | Freq: Two times a day (BID) | ORAL | Status: DC | PRN

## 2022-02-21 NOTE — ED Triage Notes (Signed)
Pt here via [facility] from the [facility] where she lives with her husband when she began to have an allergic reaction. Pt states that she only had water this morning, no food. Pt states her only pain is in her tongue. Pt denies N/V/D.

## 2022-02-21 NOTE — ED Notes (Signed)
Peri care performed; brief changed. Purewick applied.

## 2022-02-21 NOTE — ED Notes (Signed)
Secure msg sent to Jodie Smith, RN for ED to IP SBAR. °

## 2022-02-21 NOTE — Progress Notes (Signed)
Anticoagulation monitoring(Lovenox):  60yo  F ordered Lovenox 40 mg Q24h    Filed Weights   02/21/22 1038  Weight: 95.3 kg (210 lb)   BMI 31   Lab Results  Component Value Date   CREATININE 0.56 02/21/2022   CREATININE 0.49 02/17/2022   CREATININE 0.52 02/16/2022   Estimated Creatinine Clearance: 93 mL/min (by C-G formula based on SCr of 0.56 mg/dL). Hemoglobin & Hematocrit     Component Value Date/Time   HGB 9.4 (L) 02/21/2022 1407   HGB 12.1 05/14/2013 1809   HCT 28.8 (L) 02/21/2022 1407   HCT 36.3 05/14/2013 1809     Per Protocol for Patient with estCrcl >30 ml/min and BMI > 30, will transition to Lovenox 0.5 mg/kg  Q24h       Chinita Green[place] PharmD Clinical Pharmacist 02/21/2022

## 2022-02-21 NOTE — ED Provider Notes (Signed)
[facility] Provider Note    Event Date/Time   First MD Initiated Contact with Patient 02/21/22 1043     (approximate)  History   Chief Complaint: Allergic Reaction  HPI  Tiffany [name] is a 61 y.o. female with a past medical history of asthma, bipolar, COPD, prior CVA, presents to the emergency department with sensation of swelling in her tongue.  According to the patient since awakening this morning around 8:00 she has noticed a sensation of swelling to her tongue with some difficulty speaking due to the swelling of the tongue.  Denies any trouble swallowing.  Patient denies any prior swelling of her tongue.  Denies any new exposures such as new foods, etc.  Patient denies any prior allergic reactions.  Patient does state 1 episode of vomiting prior to arrival.  In reviewing the patient's record she was seen in the emergency department 3 days ago for what appears to be a similar episode with swelling in her tongue.  Patient does not appear to be on any ACE or ARB.  Prior to that patient was discharged 02/17/2022 after admission for nausea vomiting, I have reviewed her discharge summary.  Physical Exam   Triage Vital Signs: ED Triage Vitals  Enc Vitals Group     BP 02/21/22 1039 134/68     Pulse Rate 02/21/22 1039 84     Resp 02/21/22 1039 18     Temp 02/21/22 1039 98.5 F (36.9 C)     Temp Source 02/21/22 1039 Oral     SpO2 02/21/22 1039 94 %     Weight 02/21/22 1038 210 lb (95.3 kg)     Height 02/21/22 1038 5\' 9"  (1.753 m)     Head Circumference --      Peak Flow --      Pain Score 02/21/22 1038 8     Pain Loc --      Pain Edu? --      Excl. in [place]? --     Most recent vital signs: Vitals:   02/21/22 1039  BP: 134/68  Pulse: 84  Resp: 18  Temp: 98.5 F (36.9 C)  SpO2: 94%    General: Awake, no distress.  Patient does appear to have swelling of both sides of her tongue, appears to be mild.  Oropharynx is widely patent. CV:  Good peripheral  perfusion.  Regular rate and rhythm  Resp:  Normal effort.  Equal breath sounds bilaterally.  No wheeze. Abd:  No distention.  Soft, nontender.  No rebound or guarding. Other:  No obvious urticaria/hives   ED Results / Procedures / Treatments   RADIOLOGY  I personally reviewed the CT images, I do not see any obvious abnormality or signs of abscess.   MEDICATIONS ORDERED IN ED: Medications - No data to display   IMPRESSION / MDM / ASSESSMENT AND PLAN / ED COURSE  I reviewed the triage vital signs and the nursing notes.  Patient presents to the emergency department with swelling to the tongue.  Per record review this occurred 3 days ago as well and the patient was in the emergency department.  We will check labs, dose of Benadryl, Pepcid and Solu-Medrol.  We will continue to closely monitor in the emergency department.  Differential would include angioedema, allergic reaction, less likely trauma.  Patient's labs have resulted showing no significant abnormality.  Patient has anemia, largely unchanged from historic values.  Chemistry is reassuring including normal renal function.  Patient states  if anything she is feeling worse and does not feel better.  Patient does appear to have continued oral edema.  Patient states since going home from the hospital 3 days ago she has not been able to eat due to the tongue swelling is able to drink small amounts.  Has not been able to fill any of her medications.  States she has been off of all of her medications for at least the past 1 month.  We will obtain CT imaging to further evaluate.  Given the patient's continued tongue swelling unable to tolerate medications or food at home we will likely admit to the hospitalist service for further work-up and possible ENT consultation as the patient has already failed outpatient therapy once.  FINAL CLINICAL IMPRESSION(S) / ED DIAGNOSES   Tongue swelling Tongue swelling/oral edema  Note:  This document was  prepared using Dragon voice recognition software and may include unintentional dictation errors.   Minna Antis, MD 02/21/22 [phone]

## 2022-02-21 NOTE — ED Notes (Signed)
This RN asked pt how she takes her medications at home. Pt states she has been unable to take meds recently b/c she throws up. Tongue is noticeably swollen at this time.

## 2022-02-21 NOTE — H&P (Signed)
History and Physical    Tiffany [name] [id number] DOB: [birth year] DOA: 02/21/2022  Referring MD/NP/PA:   PCP: Nelle Don, MD   Patient coming from:  The patient is coming from [place] (homeless)  Chief Complaint: tongue swelling  HPI: Tiffany [name] is a 61 y.o. female with medical history significant of hyperlipidemia, COPD on 2 L oxygen, asthma, stroke, GERD, depression, migraine headache, bipolar, obesity with BMI 31.01, esophagitis, who presents with tongue swelling.  Pt was seen in ED on 2/19 due to tongue swelling and discharged as stable condition. She states that she continues to have tongue swelling, with difficulty eating food.  She has some mild tongue pain.  No cough, shortness of breath, chest pain.  Patient has nausea and vomited once with nonbilious nonbloody vomiting.  Currently no diarrhea or abdominal pain.  No active nausea vomiting.  No symptoms of UTI.  Patient is not taking ARB or ACEI.  He used to take Lamictal for bipolar, which she stopped taking this medication several months ago.  Data Reviewed and ED Course: pt was found to have negative COVID PCR, WBC 5.4, potassium 3.2, renal function okay, temperature normal, blood pressure 122/68, heart rate 85, RR 18, oxygen saturation 94% on room air.  Pt is placed on med-surg bed for obs. Dr. Pryor Ochoa of ENT is consulted.  CT-soft tissue of neck No swelling or discrete mass is appreciable within the oral cavity, pharynx or larynx by CT. The imaged upper airway is patent. Unremarkable appearance of the epiglottis.   Postinflammatory calcifications/tonsilliths within the bilateral palatine tonsils.   Cervical spondylosis.  EKG:   Not done in ED, will get one.     Review of Systems:   General: no fevers, chills, no body weight gain, has poor appetite, has fatigue HEENT: no blurry vision, hearing changes or sore throat. Has tongue swelling Respiratory: no dyspnea, coughing, wheezing CV: no chest pain, no  palpitations GI: has nausea, vomiting, no abdominal pain, diarrhea, constipation GU: no dysuria, burning on urination, increased urinary frequency, hematuria  Ext: no leg edema Neuro: no unilateral weakness, numbness, or tingling, no vision change or hearing loss Skin: no rash, no skin tear. MSK: No muscle spasm, no deformity, no limitation of range of movement in spin Heme: No easy bruising.  Travel history: No recent long distant travel.   Allergy:  Allergies  Allergen Reactions   Dairy Aid [Tilactase] Other (See Comments)    Exacerbates asthma   Egg [Eggs Or Egg-Derived Products] Other (See Comments)    Exacerbated asthma   Milk-Related Compounds     Past Medical History:  Diagnosis Date   Asthma    Bipolar 1 disorder ([place])    COPD (chronic obstructive pulmonary disease) ([place])    CVA (cerebral vascular accident) ([place])    Enlarged heart    GERD (gastroesophageal reflux disease)    Migraines     Past Surgical History:  Procedure Laterality Date   ABDOMINAL HYSTERECTOMY     ABDOMINAL SURGERY     "For acid reflux"   CESAREAN SECTION     ESOPHAGOGASTRODUODENOSCOPY N/A 02/13/2022   Procedure: ESOPHAGOGASTRODUODENOSCOPY (EGD);  Surgeon: Toledo, Benay Pike, MD;  Location: [facility] ENDOSCOPY;  Service: Gastroenterology;  Laterality: N/A;    Social History:  reports that she has never smoked. She has never used smokeless tobacco. She reports that she does not drink alcohol and does not use drugs.  Family History:  Family History  Problem Relation Age of Onset   Hypertension  Mother      Prior to Admission medications   Medication Sig Start Date End Date Taking? Authorizing Provider  acetaminophen (TYLENOL) 325 MG tablet Take 2 tablets (650 mg total) by mouth every 6 (six) hours as needed for mild pain (or Fever >/= 101). 11/08/21   Nolberto Hanlon, MD  atorvastatin (LIPITOR) 80 MG tablet Take 80 mg by mouth daily.    [provider]  diclofenac Sodium (VOLTAREN) 1 % GEL  Apply 4 g topically 4 (four) times daily. 11/08/21   Nolberto Hanlon, MD  diphenhydrAMINE (BENADRYL) 12.5 MG/5ML elixir Take 10 mLs (25 mg total) by mouth 4 (four) times daily as needed. 02/18/22   Rada Hay, MD  DULoxetine (CYMBALTA) 30 MG capsule Take 30 mg by mouth daily. 09/05/21 09/05/22  [provider]  famotidine (PEPCID) 20 MG tablet Take 1 tablet (20 mg total) by mouth 2 (two) times daily. 02/05/22   Carrie Mew, MD  famotidine (PEPCID) 20 MG tablet Take 1 tablet (20 mg total) by mouth 2 (two) times daily for 5 days. 02/18/22 02/23/22  Rada Hay, MD  FLOVENT HFA 110 MCG/ACT inhaler Inhale 2 puffs into the lungs 2 (two) times daily. 10/20/21   [provider]  folic acid (FOLVITE) 1 MG tablet Take 5 tablets (5 mg total) by mouth daily. 11/09/21   Nolberto Hanlon, MD  lamoTRIgine (LAMICTAL) 25 MG tablet Take 50 mg by mouth daily. 12/13/21   [provider]  loratadine (CLARITIN) 10 MG tablet Take 1 tablet (10 mg total) by mouth every evening. 11/08/21   Nolberto Hanlon, MD  Multiple Vitamin (MULTIVITAMIN WITH MINERALS) TABS tablet Take 1 tablet by mouth daily. 11/09/21   Nolberto Hanlon, MD  Nutritional Supplements (FEEDING SUPPLEMENT, KATE FARMS STANDARD 1.4,) LIQD liquid Take 325 mLs by mouth 3 (three) times daily between meals. 11/08/21   Nolberto Hanlon, MD  ondansetron (ZOFRAN-ODT) 4 MG disintegrating tablet Take 1 tablet (4 mg total) by mouth every 8 (eight) hours as needed for nausea or vomiting. 02/17/22   Enzo Bi, MD  pantoprazole (PROTONIX) 40 MG tablet Take 40 mg by mouth 2 (two) times daily. 08/17/21   [provider]  polyethylene glycol (MIRALAX / GLYCOLAX) 17 g packet Take 17 g by mouth 2 (two) times daily as needed. 02/17/22   Enzo Bi, MD  potassium chloride SA (KLOR-CON M) 20 MEQ tablet Take 1 tablet (20 mEq total) by mouth daily. 02/17/22 03/19/22  Enzo Bi, MD  prochlorperazine (COMPAZINE) 10 MG tablet Take 1 tablet (10 mg total) by mouth  every 8 (eight) hours as needed for nausea or vomiting. 02/17/22   Enzo Bi, MD  SPIRIVA HANDIHALER 18 MCG inhalation capsule 1 capsule at bedtime. 08/02/21   [provider]  SUMAtriptan (IMITREX) 100 MG tablet Take 100 mg by mouth daily as needed. 08/02/21   [provider]  traZODone (DESYREL) 100 MG tablet Take 1 tablet (100 mg total) by mouth at bedtime as needed for sleep. 11/08/21   Nolberto Hanlon, MD  Vitamin D, Ergocalciferol, (DRISDOL) 1.25 MG (50000 UNIT) CAPS capsule Take 1 capsule (50,000 Units total) by mouth every 7 (seven) days. 11/13/21   Nolberto Hanlon, MD    Physical Exam: Vitals:   02/21/22 1230 02/21/22 1300 02/21/22 1330 02/21/22 1500  BP: (!) 124/56 133/75 122/68 120/69  Pulse: 81 80 73 83  Resp: (!) 9 13 13    Temp:      TempSrc:      SpO2: 96% 94%  92% 93%  Weight:      Height:       General: Not in acute distress HEENT: has mild tongue swelling.  No stridor.       Eyes: PERRL, EOMI, no scleral icterus.       ENT: No discharge from the ears and nose, no pharynx injection, no tonsillar enlargement.        Neck: No JVD, no bruit, no mass felt. Heme: No neck lymph node enlargement. Cardiac: S1/S2, RRR, No murmurs, No gallops or rubs. Respiratory: No rales, wheezing, rhonchi or rubs. GI: Soft, nondistended, nontender, no rebound pain, no organomegaly, BS present. GU: No hematuria Ext: No pitting leg edema bilaterally. 1+DP/PT pulse bilaterally. Musculoskeletal: No joint deformities, No joint redness or warmth, no limitation of ROM in spin. Skin: No rashes.  Neuro: Alert, oriented X3, cranial nerves II-XII grossly intact, moves all extremities  Psych: Patient is not psychotic, no suicidal or hemocidal ideation.  Labs on Admission: I have personally reviewed following labs and imaging studies  CBC: Recent Labs  Lab 02/15/22 0546 02/16/22 0611 02/17/22 0253 02/21/22 1407  WBC 3.7* 3.7* 4.6 5.4  HGB 8.3* 8.7* 9.0* 9.4*  HCT 25.3* 27.2* 27.5* 28.8*   MCV 85.8 85.0 85.4 85.2  PLT 182 177 185 [id number]   Basic Metabolic Panel: Recent Labs  Lab 02/15/22 0546 02/16/22 0611 02/17/22 0253 02/21/22 1109  NA 137 136 135 135  K 3.6 3.5 3.2* 3.2*  CL 105 103 101 97*  CO2 29 29 30 28   GLUCOSE 82 82 78 73  BUN <5* <5* <5* 11  CREATININE 0.68 0.52 0.49 0.56  CALCIUM 8.0* 8.1* 8.1* 8.1*  MG 2.0 1.7 1.8 1.6*   GFR: Estimated Creatinine Clearance: 93 mL/min (by C-G formula based on SCr of 0.56 mg/dL). Liver Function Tests: No results for input(s): AST, ALT, ALKPHOS, BILITOT, PROT, ALBUMIN in the last 168 hours. No results for input(s): LIPASE, AMYLASE in the last 168 hours. No results for input(s): AMMONIA in the last 168 hours. Coagulation Profile: No results for input(s): INR, PROTIME in the last 168 hours. Cardiac Enzymes: No results for input(s): CKTOTAL, CKMB, CKMBINDEX, TROPONINI in the last 168 hours. BNP (last 3 results) No results for input(s): PROBNP in the last 8760 hours. HbA1C: No results for input(s): HGBA1C in the last 72 hours. CBG: No results for input(s): GLUCAP in the last 168 hours. Lipid Profile: No results for input(s): CHOL, HDL, LDLCALC, TRIG, CHOLHDL, LDLDIRECT in the last 72 hours. Thyroid Function Tests: No results for input(s): TSH, T4TOTAL, FREET4, T3FREE, THYROIDAB in the last 72 hours. Anemia Panel: No results for input(s): VITAMINB12, FOLATE, FERRITIN, TIBC, IRON, RETICCTPCT in the last 72 hours. Urine analysis:    Component Value Date/Time   COLORURINE YELLOW 02/05/2022 1420   APPEARANCEUR CLEAR (A) 02/05/2022 1420   APPEARANCEUR Clear 05/14/2013 1809   LABSPEC 1.025 02/05/2022 1420   LABSPEC 1.013 05/14/2013 1809   PHURINE 6.0 02/05/2022 1420   GLUCOSEU NEGATIVE 02/05/2022 1420   GLUCOSEU Negative 05/14/2013 1809   HGBUR NEGATIVE 02/05/2022 1420   BILIRUBINUR LARGE (A) 02/05/2022 1420   BILIRUBINUR Negative 05/14/2013 1809   KETONESUR 40 (A) 02/05/2022 1420   PROTEINUR 100 (A) 02/05/2022 1420    NITRITE NEGATIVE 02/05/2022 1420   LEUKOCYTESUR NEGATIVE 02/05/2022 1420   LEUKOCYTESUR Negative 05/14/2013 1809   Sepsis Labs: @LABRCNTIP (procalcitonin:4,lacticidven:4) ) Recent Results (from the past 240 hour(s))  Resp Panel by RT-PCR (Flu A&B, Covid) Nasopharyngeal Swab     Status:  None   Collection Time: 02/21/22  3:16 PM   Specimen: Nasopharyngeal Swab; Nasopharyngeal(NP) swabs in vial transport medium  Result Value Ref Range Status   SARS Coronavirus 2 by RT PCR NEGATIVE NEGATIVE Final    Comment: (NOTE) SARS-CoV-2 target nucleic acids are NOT DETECTED.  The SARS-CoV-2 RNA is generally detectable in upper respiratory specimens during the acute phase of infection. The lowest concentration of SARS-CoV-2 viral copies this assay can detect is 138 copies/mL. A negative result does not preclude SARS-Cov-2 infection and should not be used as the sole basis for treatment or other patient management decisions. A negative result may occur with  improper specimen collection/handling, submission of specimen other than nasopharyngeal swab, presence of viral mutation(s) within the areas targeted by this assay, and inadequate number of viral copies(<138 copies/mL). A negative result must be combined with clinical observations, patient history, and epidemiological information. The expected result is Negative.  Fact Sheet for Patients:  EntrepreneurPulse.com.au  Fact Sheet for Healthcare Providers:  IncredibleEmployment.be  This test is no t yet approved or cleared by the [place] FDA and  has been authorized for detection and/or diagnosis of SARS-CoV-2 by FDA under an Emergency Use Authorization (EUA). This EUA will remain  in effect (meaning this test can be used) for the duration of the COVID-19 declaration under Section 564(b)(1) of the Act, 21 U.S.C.section 360bbb-3(b)(1), unless the authorization is terminated  or revoked sooner.        Influenza A by PCR NEGATIVE NEGATIVE Final   Influenza B by PCR NEGATIVE NEGATIVE Final    Comment: (NOTE) The Xpert Xpress SARS-CoV-2/FLU/RSV plus assay is intended as an aid in the diagnosis of influenza from Nasopharyngeal swab specimens and should not be used as a sole basis for treatment. Nasal washings and aspirates are unacceptable for Xpert Xpress SARS-CoV-2/FLU/RSV testing.  Fact Sheet for Patients: EntrepreneurPulse.com.au  Fact Sheet for Healthcare Providers: IncredibleEmployment.be  This test is not yet approved or cleared by the [place] FDA and has been authorized for detection and/or diagnosis of SARS-CoV-2 by FDA under an Emergency Use Authorization (EUA). This EUA will remain in effect (meaning this test can be used) for the duration of the COVID-19 declaration under Section 564(b)(1) of the Act, 21 U.S.C. section 360bbb-3(b)(1), unless the authorization is terminated or revoked.  Performed at [facility] ([place]), [address] [place], [place] [address]      Radiological Exams on Admission: CT Soft Tissue Neck W Contrast  Result Date: 02/21/2022 CLINICAL DATA:  Provided history: Laryngeal edema. Personal history provided: Allergic reaction. Tongue pain. EXAM: CT NECK WITH CONTRAST TECHNIQUE: Multidetector CT imaging of the neck was performed using the standard protocol following the bolus administration of intravenous contrast. RADIATION DOSE REDUCTION: This exam was performed according to the departmental dose-optimization program which includes automated exposure control, adjustment of the mA and/or kV according to patient size and/or use of iterative reconstruction technique. CONTRAST:  24mL OMNIPAQUE IOHEXOL 300 MG/ML  SOLN COMPARISON:  CT cervical spine 10/29/2021. FINDINGS: Pharynx and larynx: Poor dentition with multiple absent teeth. No swelling or discrete mass is appreciable within the oral cavity, pharynx or  larynx by CT. Unremarkable appearance of the epiglottis. The imaged upper airway is patent. Postinflammatory calcifications/tonsilliths within the bilateral palatine tonsils. Salivary glands: No inflammation, mass, or stone. Thyroid: Unremarkable. Lymph nodes: No pathologically enlarged cervical chain lymph nodes. Vascular: The major vascular structures of the neck are patent. Partially retropharyngeal course of the left ICA. Limited intracranial: The intracranial  contents are only minimally included in the field of view. No evidence of acute intracranial abnormality at the imaged levels. Visualized orbits: No orbital mass or acute orbital finding. Mastoids and visualized paranasal sinuses: Portions of the frontal and ethmoid sinuses are excluded from the field of view. No significant paranasal sinus disease or mastoid effusion at the imaged levels. Skeleton: No acute bony abnormality or aggressive osseous lesion. Straightening of the expected cervical lordosis. Cervical spondylosis. Upper chest: No consolidation within the imaged lung apices. IMPRESSION: No swelling or discrete mass is appreciable within the oral cavity, pharynx or larynx by CT. The imaged upper airway is patent. Unremarkable appearance of the epiglottis. Postinflammatory calcifications/tonsilliths within the bilateral palatine tonsils. Cervical spondylosis. Electronically Signed   By: Kellie Simmering D.O.   On: 02/21/2022 14:45      Assessment/Plan Principal Problem:   Angioedema Active Problems:   Hypokalemia   Bipolar 1 disorder (HCC)   HLD (hyperlipidemia)   COPD (chronic obstructive pulmonary disease) (HCC)   Stroke (HCC)   Obesity (BMI 30-39.9)   Depression   Homeless   Possible angioedema: Patient has mild tongue swelling, no airway compromise.  No stridor.  Patient has difficulty eating food.  Etiology is not clear, possibly due to angioedema.  Patient is not taking ACE inhibitor, ARB.  Patient stopped taking Lamictal several  months ago. Consulted Dr. Pryor Ochoa of ENT.  Per Dr. Pryor Ochoa, pt refused scope, and pt has patent airway widely. He recommended to do hereditary angioedema work-up and follow-up with ENT as outpatient.  - place on med-surg bed for obs -Labs: C4, C3, C1 inhibitor level -Solu-Medrol 60 mg tid -IV Benadryl 25 mg bid -IV pepcid 20 mg bid -IVF  Hypokalemia: K 3.2 -repleted K -check Mg   Bipolar 1 disorder and depression: -Continue Cymbalta,  HLD (hyperlipidemia) -Lipitor  COPD (chronic obstructive pulmonary disease) ([place]): stable -Bronchodilators  History of stroke (HCC) -Lipitor  Obesity (BMI 30-39.9): BMI 31.01 -Diet and exercise.   -Encouraged to lose weight.   Homeless -consult TOC      DVT ppx: SQ Lovenox  Code Status: Full code  Family Communication:    Yes, patient's fianc at bed side.    Disposition Plan:  to be determined  Consults called:  Dr. Pryor Ochoa of ENT is consulted.  Admission status and Level of care: Med-Surg:   for obs     Severity of Illness:  The appropriate patient status for this patient is OBSERVATION. Observation status is judged to be reasonable and necessary in order to provide the required intensity of service to ensure the patient's safety. The patient's presenting symptoms, physical exam findings, and initial radiographic and laboratory data in the context of their medical condition is felt to place them at decreased risk for further clinical deterioration. Furthermore, it is anticipated that the patient will be medically stable for discharge from the hospital within 2 midnights of admission.        Date of Service 02/21/2022    Ivor Costa [facility]   If 7PM-7AM, please contact night-coverage [email or web address] 02/21/2022, 6:53 PM

## 2022-02-21 NOTE — ED Notes (Signed)
Pt provided with warm blanket & straw per request. No additional needs verbalized/identified at this time. Bed low & locked; family at [facility]; call light & personal items within reach.

## 2022-02-21 NOTE — ED Notes (Signed)
This RN called & spoke with Dellis Filbert in the lab about why blood work is not resulted yet on labs that were sent at 1041. He will look into this now.

## 2022-02-21 NOTE — ED Notes (Signed)
Pt states her hands are tingling and she has a swollen tongue. EDP at bedside.

## 2022-02-21 NOTE — Final Consult Note (Addendum)
..Tiffany [name], [place] [id number] 07-13-1961 Ivor Costa, MD  Reason for Consult: tongue swelling  HPI: 61 y.o. female presented to ER for 1 day history of tongue swelling.  Patient reports that her tongue has progressively swelled today and is worse now than when she arrived.  She reports she is having a difficult time talking due to swelling.  She denies any new medications and reports that she stopped all medications for over a month.  Was seen several days ago in ER for similar issue and discharged home.  She previously was admitted recently for intractable nausea and vomiting as well.  Allergies:  Allergies  Allergen Reactions   Dairy Aid [Tilactase] Other (See Comments)    Exacerbates asthma   Egg [Eggs Or Egg-Derived Products] Other (See Comments)    Exacerbated asthma   Milk-Related Compounds     ROS: Review of systems normal other than 12 systems except per HPI.  PMH:  Past Medical History:  Diagnosis Date   Asthma    Bipolar 1 disorder ([place])    COPD (chronic obstructive pulmonary disease) (HCC)    CVA (cerebral vascular accident) ([place])    Enlarged heart    GERD (gastroesophageal reflux disease)    Migraines     FH:  Family History  Problem Relation Age of Onset   Hypertension Mother     SH:  Social History   Socioeconomic History   Marital status: Single    Spouse name: Not on file   Number of children: Not on file   Years of education: Not on file   Highest education level: Not on file  Occupational History   Not on file  Tobacco Use   Smoking status: Never   Smokeless tobacco: Never  Vaping Use   Vaping Use: Never used  Substance and Sexual Activity   Alcohol use: Never   Drug use: Never   Sexual activity: Not on file  Other Topics Concern   Not on file  Social History Narrative   Not on file   Social Determinants of Health   Financial Resource Strain: Not on file  Food Insecurity: Not on file  Transportation Needs: Not on file  Physical  Activity: Not on file  Stress: Not on file  Social Connections: Not on file  Intimate Partner Violence: Not on file    PSH:  Past Surgical History:  Procedure Laterality Date   ABDOMINAL HYSTERECTOMY     ABDOMINAL SURGERY     "For acid reflux"   CESAREAN SECTION     ESOPHAGOGASTRODUODENOSCOPY N/A 02/13/2022   Procedure: ESOPHAGOGASTRODUODENOSCOPY (EGD);  Surgeon: Toledo, Benay Pike, MD;  Location: [facility] ENDOSCOPY;  Service: Gastroenterology;  Laterality: N/A;    Physical  Exam:  GEN- supine in bed in NAD NEURO-CN 2-12 grossly intact and symmetric. EARS- external ears clear NOSE- clear anteriorly OC/OP- macroglossia, no floor of mouth edema or erythema, no bogginess to tongue.  Good mobility.  Previous piercing site seen and not erythematous.  Posterior oropharynx visualized and airway patient.  Poor dentition and missing most of mandibular molars and pre-molars with tongue prolapsing laterally over mandible. NECK- supple RESP-  unlabored, no stridor, no altered voice CARD-  RRR  Trans-nasal flexible laryngoscopy-  patient refused stating claustrophobic  CT- reviewed and no significant abnormality noted, widely patent airway   A/P: Intermittent tongue swelling with no significant worrisome findings on my exam today.  Plan:  Difficult to see significant tongue swelling today, certainly an enlarged tongue but I am unclear of  what her baseline is.  EGD notes on 2/14 report that was noted to have enlarged tongue at intubation at that time as well. No boggy edema.  Patient refused scope.  CT shows airway to be patent.  Recommend workup for hereditary angioedema as well as food diary.  Would treat currently like allergic reaction and continue steroid taper upon discharge as well as antihistamines.  Follow up as an outpatient for follow up exam and hopefully we can perform laryngoscopy at that time.   [name] 02/21/2022 7:17 PM

## 2022-02-22 DIAGNOSIS — T783XXD Angioneurotic edema, subsequent encounter: Secondary | ICD-10-CM | POA: Diagnosis not present

## 2022-02-22 LAB — BASIC METABOLIC PANEL
Anion gap: 12 (ref 5–15)
BUN: 10 mg/dL (ref 6–20)
CO2: 25 mmol/L (ref 22–32)
Calcium: 7.7 mg/dL — ABNORMAL LOW (ref 8.9–10.3)
Chloride: 97 mmol/L — ABNORMAL LOW (ref 98–111)
Creatinine, Ser: 0.52 mg/dL (ref 0.44–1.00)
GFR, Estimated: >60 mL/min (ref >60)
Glucose, Bld: 147 mg/dL — ABNORMAL HIGH (ref 70–99)
Potassium: 3.4 mmol/L — ABNORMAL LOW (ref 3.5–5.1)
Sodium: 134 mmol/L — ABNORMAL LOW (ref 135–145)

## 2022-02-22 LAB — C4 COMPLEMENT: Complement C4, Body Fluid: 40 mg/dL — ABNORMAL HIGH (ref 12–38)

## 2022-02-22 LAB — C3 COMPLEMENT: C3 Complement: 122 mg/dL (ref 82–167)

## 2022-02-22 LAB — LAMOTRIGINE LEVEL: Lamotrigine Lvl: <1 ug/mL — ABNORMAL LOW (ref 2.0–20.0)

## 2022-02-22 MED ORDER — PREDNISONE 20 MG PO TABS: 20.0000 mg | ORAL_TABLET | Freq: Every day | ORAL | 0 refills | Status: DC

## 2022-02-22 MED ORDER — PREDNISONE 20 MG PO TABS
20.0000 mg | ORAL_TABLET | Freq: Every day | ORAL | Status: DC
Start: 2022-02-23 — End: 2022-02-22

## 2022-02-22 MED ORDER — POTASSIUM CHLORIDE 20 MEQ PO PACK: 40.0000 meq | PACK | Freq: Every day | ORAL | 0 refills | Status: DC

## 2022-02-22 MED ORDER — ORAL CARE MOUTH RINSE
15.0000 mL | Freq: Two times a day (BID) | OROMUCOSAL | Status: DC
  Administered 2022-02-22: 15 mL via OROMUCOSAL

## 2022-02-22 MED ORDER — OYSTER SHELL CALCIUM/D3 500-5 MG-MCG PO TABS: 1.0000 | ORAL_TABLET | Freq: Every day | ORAL | Status: DC

## 2022-02-22 MED ORDER — OYSTER SHELL CALCIUM/D3 500-5 MG-MCG PO TABS
1.0000 | ORAL_TABLET | Freq: Every day | ORAL | 1 refills | Status: DC
Start: 2022-02-23 — End: 2022-03-16

## 2022-02-22 NOTE — Discharge Summary (Signed)
Physician Discharge Summary   Patient: Tiffany Crockerlecia W Trantham MRN: [id number] DOB: Sep 26, 1961  Admit date:     02/21/2022  Discharge date: 02/22/22  Discharge Physician: Enedina FinnerSona Kaulin Chaves   PCP: Eddie DibblesAllison, James H, MD   Recommendations at discharge:    Cont with soft diet as you can tolerate F/u ENT Dr Andee PolesVaught in 1-2 weeks  Discharge Diagnoses: Mild Angioedema--to unclear source--improving Hospital Course:  Tiffany [name] is a 61 y.o. female with medical history significant of hyperlipidemia, COPD on 2 L oxygen, asthma, stroke, GERD, depression, migraine headache, bipolar, obesity with BMI 31.01, esophagitis, who presents with tongue swelling. CT-soft tissue of neck No swelling or discrete mass is appreciable within the oral cavity, pharynx or larynx by CT. The imaged upper airway is patent. Unremarkable appearance of the epiglottis.   Postinflammatory calcifications/tonsilliths within the bilateral palatine tonsils.   Cervical spondylosis.  Possible angioedema: Patient has mild tongue swelling, no airway compromise.  No stridor.  Patient has difficulty eating solid food. Pt tolerates soft food -- Etiology is not clear, possibly due to angioedema.  Patient is not taking ACE inhibitor, ARB.  -- Patient stopped taking Lamictal several months ago.  --Consulted Dr. Andee PolesVaught of ENT--pt refused direct laryngoscope. He recommended to do hereditary angioedema work-up and follow-up with ENT as outpatient. --cont po steroid,pepcid and benadryl  -Labs: C4, C3, C1 inhibitor level sent   Hypokalemia: K 3.2 -repleted K   Bipolar 1 disorder and depression: -Continue Cymbalta   HLD (hyperlipidemia) -Lipitor   COPD (chronic obstructive pulmonary disease) (HCC): stable -Bronchodilators   History of stroke (HCC) -Lipitor   Obesity (BMI 30-39.9): BMI 31.01 -Diet and exercise.   -Encouraged to lose weight.          Consultants: ENT Procedures performed: none  Disposition: Home ([facility]  [facility]) Diet recommendation:  Discharge Diet Orders (From admission, onward)     Start     Ordered   02/22/22 0000  Diet - low sodium heart healthy        02/22/22 1000           Full liquid diet/soft diet  DISCHARGE MEDICATION: Allergies as of 02/22/2022       Reactions   Dairy Aid [tilactase] Other (See Comments)   Exacerbates asthma   Egg [eggs Or Egg-derived Products] Other (See Comments)   Exacerbated asthma   Milk-related Compounds         Medication List     STOP taking these medications    lamoTRIgine 25 MG tablet Commonly known as: LAMICTAL   potassium chloride SA 20 MEQ tablet Commonly known as: KLOR-CON M   prochlorperazine 10 MG tablet Commonly known as: COMPAZINE   SUMAtriptan 100 MG tablet Commonly known as: IMITREX       TAKE these medications    acetaminophen 325 MG tablet Commonly known as: TYLENOL Take 2 tablets (650 mg total) by mouth every 6 (six) hours as needed for mild pain (or Fever >/= 101).   atorvastatin 80 MG tablet Commonly known as: LIPITOR Take 80 mg by mouth daily.   calcium-vitamin D 500-5 MG-MCG tablet Commonly known as: OSCAL WITH D Take 1 tablet by mouth daily with breakfast. Start taking on: February 23, 2022   diclofenac Sodium 1 % Gel Commonly known as: VOLTAREN Apply 4 g topically 4 (four) times daily.   diphenhydrAMINE 12.5 MG/5ML elixir Commonly known as: BENADRYL Take 10 mLs (25 mg total) by mouth 4 (four) times daily as needed.   DULoxetine 30  MG capsule Commonly known as: CYMBALTA Take 30 mg by mouth daily.   famotidine 20 MG tablet Commonly known as: PEPCID Take 1 tablet (20 mg total) by mouth 2 (two) times daily.   feeding supplement (KATE FARMS STANDARD 1.4) Liqd liquid Take 325 mLs by mouth 3 (three) times daily between meals.   Flovent HFA 110 MCG/ACT inhaler Generic drug: fluticasone Inhale 2 puffs into the lungs 2 (two) times daily.   folic acid 1 MG tablet Commonly known as:  FOLVITE Take 5 tablets (5 mg total) by mouth daily.   loratadine 10 MG tablet Commonly known as: CLARITIN Take 1 tablet (10 mg total) by mouth every evening.   multivitamin with minerals Tabs tablet Take 1 tablet by mouth daily.   ondansetron 4 MG disintegrating tablet Commonly known as: ZOFRAN-ODT Take 1 tablet (4 mg total) by mouth every 8 (eight) hours as needed for nausea or vomiting.   pantoprazole 40 MG tablet Commonly known as: PROTONIX Take 40 mg by mouth 2 (two) times daily.   polyethylene glycol 17 g packet Commonly known as: MIRALAX / GLYCOLAX Take 17 g by mouth 2 (two) times daily as needed.   potassium chloride 20 MEQ packet Commonly known as: KLOR-CON Take 40 mEq by mouth daily for 3 days. Start taking on: February 23, 2022   predniSONE 20 MG tablet Commonly known as: DELTASONE Take 1 tablet (20 mg total) by mouth daily with breakfast for 4 days. Start taking on: February 23, 2022   Spiriva HandiHaler 18 MCG inhalation capsule Generic drug: tiotropium 1 capsule at bedtime.   traZODone 100 MG tablet Commonly known as: DESYREL Take 1 tablet (100 mg total) by mouth at bedtime as needed for sleep.   Vitamin D (Ergocalciferol) 1.25 MG (50000 UNIT) Caps capsule Commonly known as: DRISDOL Take 1 capsule (50,000 Units total) by mouth every 7 (seven) days.               Discharge Care Instructions  (From admission, onward)           Start     Ordered   02/22/22 0000  Discharge wound care:       Comments: Foam padding   02/22/22 1000            Follow-up Information     Eddie Dibbles, MD. Schedule an appointment as soon as possible for a visit in 1 week(s).   Specialty: Family Medicine Why: hosp f/u Contact information: [address] [place] [address] [phone]                 Discharge Exam: Filed Weights   02/21/22 1038  Weight: 95.3 kg     Condition at discharge: fair  The results of significant  diagnostics from this hospitalization (including imaging, microbiology, ancillary and laboratory) are listed below for reference.   Imaging Studies: CT Soft Tissue Neck W Contrast  Result Date: 02/21/2022 CLINICAL DATA:  Provided history: Laryngeal edema. Personal history provided: Allergic reaction. Tongue pain. EXAM: CT NECK WITH CONTRAST TECHNIQUE: Multidetector CT imaging of the neck was performed using the standard protocol following the bolus administration of intravenous contrast. RADIATION DOSE REDUCTION: This exam was performed according to the departmental dose-optimization program which includes automated exposure control, adjustment of the mA and/or kV according to patient size and/or use of iterative reconstruction technique. CONTRAST:  32mL OMNIPAQUE IOHEXOL 300 MG/ML  SOLN COMPARISON:  CT cervical spine 10/29/2021. FINDINGS: Pharynx and larynx: Poor dentition with multiple  absent teeth. No swelling or discrete mass is appreciable within the oral cavity, pharynx or larynx by CT. Unremarkable appearance of the epiglottis. The imaged upper airway is patent. Postinflammatory calcifications/tonsilliths within the bilateral palatine tonsils. Salivary glands: No inflammation, mass, or stone. Thyroid: Unremarkable. Lymph nodes: No pathologically enlarged cervical chain lymph nodes. Vascular: The major vascular structures of the neck are patent. Partially retropharyngeal course of the left ICA. Limited intracranial: The intracranial contents are only minimally included in the field of view. No evidence of acute intracranial abnormality at the imaged levels. Visualized orbits: No orbital mass or acute orbital finding. Mastoids and visualized paranasal sinuses: Portions of the frontal and ethmoid sinuses are excluded from the field of view. No significant paranasal sinus disease or mastoid effusion at the imaged levels. Skeleton: No acute bony abnormality or aggressive osseous lesion. Straightening of the  expected cervical lordosis. Cervical spondylosis. Upper chest: No consolidation within the imaged lung apices. IMPRESSION: No swelling or discrete mass is appreciable within the oral cavity, pharynx or larynx by CT. The imaged upper airway is patent. Unremarkable appearance of the epiglottis. Postinflammatory calcifications/tonsilliths within the bilateral palatine tonsils. Cervical spondylosis. Electronically Signed   By: Jackey Loge D.O.   On: 02/21/2022 14:45   CT Abdomen Pelvis W Contrast  Result Date: 02/07/2022 CLINICAL DATA:  Nausea and vomiting for 2 days. EXAM: CT ABDOMEN AND PELVIS WITH CONTRAST TECHNIQUE: Multidetector CT imaging of the abdomen and pelvis was performed using the standard protocol following bolus administration of intravenous contrast. RADIATION DOSE REDUCTION: This exam was performed according to the departmental dose-optimization program which includes automated exposure control, adjustment of the mA and/or kV according to patient size and/or use of iterative reconstruction technique. CONTRAST:  OMNIPAQUE IOHEXOL 300 MG/ML  SOLN COMPARISON:  Abdominopelvic CT 08/12/2021. FINDINGS: Lower chest: Clear lung bases. No significant pleural or pericardial effusion. Moderate size hiatal hernia appears similar to the previous study, with possible associated wall thickening. Hepatobiliary: The liver is normal in density without suspicious focal abnormality. Probable dependent sludge in the gallbladder lumen. No evidence of gallstones, gallbladder wall thickening or surrounding inflammation. No evidence of biliary ductal dilatation. Pancreas: Fatty replaced. No evidence of ductal dilatation or surrounding inflammation. Spleen: Normal in size without focal abnormality. Adrenals/Urinary Tract: Both adrenal glands appear normal. The kidneys appear normal without evidence of urinary tract calculus, suspicious lesion or hydronephrosis. No bladder abnormalities are seen. Stomach/Bowel: No  enteric contrast administered. As above, moderate-size hiatal hernia. The stomach otherwise appears unremarkable for its degree of distension. No evidence of bowel wall thickening, distention or surrounding inflammatory change. Prior study suggested previous appendectomy. Currently, there is a mildly prominent tubular structure projecting inferiorly from the cecal tip which appears blind-ending and likely reflects the appendix. This measures up to 8 mm in diameter, but demonstrates no surrounding inflammation. Vascular/Lymphatic: There are no enlarged abdominal or pelvic lymph nodes. No acute vascular findings. Scattered retroperitoneal phleboliths. Reproductive: Hysterectomy. Stable appearance of the left ovary. No suspicious adnexal findings. Other: No evidence of abdominal wall mass or hernia. No ascites. Musculoskeletal: No acute or significant osseous findings. Generalized muscular atrophy. IMPRESSION: 1. No definite acute findings. Apparent prominence of the appendix without surrounding inflammatory changes to suggest acute appendicitis in this patient without leukocytosis. Recommend clinical follow up. 2. Moderate size hiatal hernia.  No evidence of bowel obstruction. 3. Previous hysterectomy. Electronically Signed   By: Carey Bullocks M.D.   On: 02/07/2022 16:20    Microbiology: Results for orders placed  or performed during the hospital encounter of 02/21/22  Resp Panel by RT-PCR (Flu A&B, Covid) Nasopharyngeal Swab     Status: None   Collection Time: 02/21/22  3:16 PM   Specimen: Nasopharyngeal Swab; Nasopharyngeal(NP) swabs in vial transport medium  Result Value Ref Range Status   SARS Coronavirus 2 by RT PCR NEGATIVE NEGATIVE Final    Comment: (NOTE) SARS-CoV-2 target nucleic acids are NOT DETECTED.  The SARS-CoV-2 RNA is generally detectable in upper respiratory specimens during the acute phase of infection. The lowest concentration of SARS-CoV-2 viral copies this assay can detect is 138  copies/mL. A negative result does not preclude SARS-Cov-2 infection and should not be used as the sole basis for treatment or other patient management decisions. A negative result may occur with  improper specimen collection/handling, submission of specimen other than nasopharyngeal swab, presence of viral mutation(s) within the areas targeted by this assay, and inadequate number of viral copies(<138 copies/mL). A negative result must be combined with clinical observations, patient history, and epidemiological information. The expected result is Negative.  Fact Sheet for Patients:  BloggerCourse.com  Fact Sheet for Healthcare Providers:  SeriousBroker.it  This test is no t yet approved or cleared by the [place] FDA and  has been authorized for detection and/or diagnosis of SARS-CoV-2 by FDA under an Emergency Use Authorization (EUA). This EUA will remain  in effect (meaning this test can be used) for the duration of the COVID-19 declaration under Section 564(b)(1) of the Act, 21 U.S.C.section 360bbb-3(b)(1), unless the authorization is terminated  or revoked sooner.       Influenza A by PCR NEGATIVE NEGATIVE Final   Influenza B by PCR NEGATIVE NEGATIVE Final    Comment: (NOTE) The Xpert Xpress SARS-CoV-2/FLU/RSV plus assay is intended as an aid in the diagnosis of influenza from Nasopharyngeal swab specimens and should not be used as a sole basis for treatment. Nasal washings and aspirates are unacceptable for Xpert Xpress SARS-CoV-2/FLU/RSV testing.  Fact Sheet for Patients: BloggerCourse.com  Fact Sheet for Healthcare Providers: SeriousBroker.it  This test is not yet approved or cleared by the [place] FDA and has been authorized for detection and/or diagnosis of SARS-CoV-2 by FDA under an Emergency Use Authorization (EUA). This EUA will remain in effect (meaning  this test can be used) for the duration of the COVID-19 declaration under Section 564(b)(1) of the Act, 21 U.S.C. section 360bbb-3(b)(1), unless the authorization is terminated or revoked.  Performed at [facility], [address] [place], [place] [address]     Labs: CBC: Recent Labs  Lab 02/16/22 [id number] 02/17/22 0253 02/21/22 1407  WBC 3.7* 4.6 5.4  HGB 8.7* 9.0* 9.4*  HCT 27.2* 27.5* 28.8*  MCV 85.0 85.4 85.2  PLT 177 185 186   Basic Metabolic Panel: Recent Labs  Lab 02/16/22 0611 02/17/22 0253 02/21/22 1109 02/22/22 0413  NA 136 135 135 134*  K 3.5 3.2* 3.2* 3.4*  CL 103 101 97* 97*  CO2 29 30 28 25   GLUCOSE 82 78 73 147*  BUN <5* <5* 11 10  CREATININE 0.52 0.49 0.56 0.52  CALCIUM 8.1* 8.1* 8.1* 7.7*  MG 1.7 1.8 1.6*  --    Liver Function Tests: No results for input(s): AST, ALT, ALKPHOS, BILITOT, PROT, ALBUMIN in the last 168 hours. CBG: No results for input(s): GLUCAP in the last 168 hours.  Discharge time spent: less than 30 minutes.  Signed: , MD [facility] 02/22/2022

## 2022-02-22 NOTE — TOC Initial Note (Signed)
Transition of Care [facility]) - Initial/Assessment Note    Patient Details  Name: Tiffany [name] MRN: [id number] Date of Birth: 1961/11/25  Transition of Care [facility]) CM/SW Contact:    Chapman Fitch, RN Phone Number: 02/22/2022, 10:33 AM  Clinical Narrative:                  Patient to discharge today Significant other at bedside They both confirm the plan is to return to the [facility] room [address] [facility] health notified of discharge and faxed resumption orders Patient confirms that she has a concentrator for O2 and wears 2 L ago home.   Boyfriend confirms he will be at the hotel when EMS arrives, and will pick up new prescriptions after discharge  EMS Packet printed EMS transport called for 12 pm.  Bedside RN notified        Patient Goals and CMS Choice        Expected Discharge Plan and Services           Expected Discharge Date: 02/22/22                                    Prior Living Arrangements/Services                       Activities of Daily Living Home Assistive Devices/Equipment: Oxygen ADL Screening (condition at time of admission) Patient's cognitive ability adequate to safely complete daily activities?: Yes Is the patient deaf or have difficulty hearing?: No Does the patient have difficulty seeing, even when wearing glasses/contacts?: No Does the patient have difficulty concentrating, remembering, or making decisions?: No Patient able to express need for assistance with ADLs?: Yes Does the patient have difficulty dressing or bathing?: Yes Independently performs ADLs?: No Communication: Independent Dressing (OT): Dependent Is this a change from baseline?: Pre-admission baseline Grooming: Dependent Is this a change from baseline?: Pre-admission baseline Feeding: Needs assistance Is this a change from baseline?: Pre-admission baseline Bathing: Dependent Is this a change from baseline?: Pre-admission baseline Toileting:  Dependent Is this a change from baseline?: Pre-admission baseline In/Out Bed: Dependent Is this a change from baseline?: Pre-admission baseline Walks in Home: Dependent Is this a change from baseline?: Pre-admission baseline Does the patient have difficulty walking or climbing stairs?: Yes Weakness of Legs: Both Weakness of Arms/Hands: Both  Permission Sought/Granted                  Emotional Assessment              Admission diagnosis:  Angioedema [T78.3XXA] Allergic reaction [T78.40XA] Tongue swelling [R22.0] Patient Active Problem List   Diagnosis Date Noted   Angioedema 02/21/2022   HLD (hyperlipidemia) 02/21/2022   COPD (chronic obstructive pulmonary disease) (HCC) 02/21/2022   Stroke (HCC) 02/21/2022   Migraine 02/21/2022   Obesity (BMI 30-39.9) 02/21/2022   Depression 02/21/2022   Homeless 02/21/2022   Inadequate oral intake 02/13/2022   History of esophagitis 02/12/2022   Hiatal hernia 02/12/2022   Obesity, Class III, BMI 40-49.9 (morbid obesity) (HCC) 02/11/2022   Pressure injury of skin 02/08/2022   Hypomagnesemia 02/08/2022   Hyponatremia 02/08/2022   Bipolar 1 disorder (HCC) 02/08/2022   Asthma, chronic 02/08/2022   History of migraine 02/08/2022   Dyslipidemia 02/08/2022   Intractable nausea and vomiting 02/07/2022   Hypoxia    Acute respiratory failure with hypoxia and hypercarbia (HCC)  Ambulatory dysfunction    Primary hypertension    Encephalopathy 11/13/2021   Hypokalemia 10/29/2021   Frequent falls 10/29/2021   Refractory nausea and vomiting 10/29/2021   Thrombocytopenia (HCC) 10/29/2021   GERD (gastroesophageal reflux disease) 10/29/2021   PCP:  Eddie Dibbles, MD Pharmacy:   [facility] [address] [place] - [address] [place] [address] Phone: [phone] Fax: [phone]  [facility] - [address]), [place] - [address] [place]) [place] [address] Phone: [phone] Fax: [phone]     Social Determinants of Health (SDOH) Interventions    Readmission Risk Interventions Readmission Risk Prevention Plan 11/08/2021  Transportation Screening Complete  PCP or Specialist Appt within 5-7 Days Complete  Home Care Screening Complete  Medication Review (RN CM) Complete  Some recent data might be hidden

## 2022-02-23 ENCOUNTER — Other Ambulatory Visit: Payer: Self-pay

## 2022-02-23 DIAGNOSIS — Z91012 Allergy to eggs: Secondary | ICD-10-CM

## 2022-02-23 DIAGNOSIS — J449 Chronic obstructive pulmonary disease, unspecified: Secondary | ICD-10-CM | POA: Diagnosis present

## 2022-02-23 DIAGNOSIS — Z20822 Contact with and (suspected) exposure to covid-19: Secondary | ICD-10-CM | POA: Diagnosis present

## 2022-02-23 DIAGNOSIS — Z8673 Personal history of transient ischemic attack (TIA), and cerebral infarction without residual deficits: Secondary | ICD-10-CM

## 2022-02-23 DIAGNOSIS — T783XXA Angioneurotic edema, initial encounter: Secondary | ICD-10-CM | POA: Diagnosis present

## 2022-02-23 DIAGNOSIS — E669 Obesity, unspecified: Secondary | ICD-10-CM | POA: Diagnosis present

## 2022-02-23 DIAGNOSIS — Z5901 Sheltered homelessness: Secondary | ICD-10-CM

## 2022-02-23 DIAGNOSIS — J9611 Chronic respiratory failure with hypoxia: Secondary | ICD-10-CM | POA: Diagnosis present

## 2022-02-23 DIAGNOSIS — Z9981 Dependence on supplemental oxygen: Secondary | ICD-10-CM

## 2022-02-23 DIAGNOSIS — K219 Gastro-esophageal reflux disease without esophagitis: Secondary | ICD-10-CM | POA: Diagnosis present

## 2022-02-23 DIAGNOSIS — G4730 Sleep apnea, unspecified: Secondary | ICD-10-CM | POA: Diagnosis present

## 2022-02-23 DIAGNOSIS — Z6831 Body mass index (BMI) 31.0-31.9, adult: Secondary | ICD-10-CM

## 2022-02-23 DIAGNOSIS — D638 Anemia in other chronic diseases classified elsewhere: Secondary | ICD-10-CM | POA: Diagnosis present

## 2022-02-23 DIAGNOSIS — K5900 Constipation, unspecified: Secondary | ICD-10-CM | POA: Diagnosis present

## 2022-02-23 DIAGNOSIS — K2210 Ulcer of esophagus without bleeding: Secondary | ICD-10-CM | POA: Diagnosis present

## 2022-02-23 DIAGNOSIS — B9620 Unspecified Escherichia coli [E. coli] as the cause of diseases classified elsewhere: Secondary | ICD-10-CM | POA: Diagnosis present

## 2022-02-23 DIAGNOSIS — E785 Hyperlipidemia, unspecified: Secondary | ICD-10-CM | POA: Diagnosis present

## 2022-02-23 DIAGNOSIS — Z1619 Resistance to other specified beta lactam antibiotics: Secondary | ICD-10-CM | POA: Diagnosis present

## 2022-02-23 DIAGNOSIS — Z79899 Other long term (current) drug therapy: Secondary | ICD-10-CM

## 2022-02-23 DIAGNOSIS — F319 Bipolar disorder, unspecified: Secondary | ICD-10-CM | POA: Diagnosis present

## 2022-02-23 DIAGNOSIS — Z91011 Allergy to milk products: Secondary | ICD-10-CM

## 2022-02-23 DIAGNOSIS — N390 Urinary tract infection, site not specified: Principal | ICD-10-CM | POA: Diagnosis present

## 2022-02-23 DIAGNOSIS — K3184 Gastroparesis: Secondary | ICD-10-CM | POA: Diagnosis present

## 2022-02-23 DIAGNOSIS — E876 Hypokalemia: Secondary | ICD-10-CM | POA: Diagnosis present

## 2022-02-23 LAB — CBC
HCT: 29.6 % — ABNORMAL LOW (ref 36.0–46.0)
Hemoglobin: 9.6 g/dL — ABNORMAL LOW (ref 12.0–15.0)
MCH: 27.6 pg (ref 26.0–34.0)
MCHC: 32.4 g/dL (ref 30.0–36.0)
MCV: 85.1 fL (ref 80.0–100.0)
Platelets: 200 10*3/uL (ref 150–400)
RBC: 3.48 MIL/uL — ABNORMAL LOW (ref 3.87–5.11)
RDW: 15.2 % (ref 11.5–15.5)
WBC: 6.6 10*3/uL (ref 4.0–10.5)
nRBC: 0 % (ref 0.0–0.2)

## 2022-02-23 LAB — COMPREHENSIVE METABOLIC PANEL
ALT: 14 U/L (ref 0–44)
AST: 18 U/L (ref 15–41)
Albumin: 2.6 g/dL — ABNORMAL LOW (ref 3.5–5.0)
Alkaline Phosphatase: 53 U/L (ref 38–126)
Anion gap: 10 (ref 5–15)
BUN: 11 mg/dL (ref 6–20)
CO2: 29 mmol/L (ref 22–32)
Calcium: 8.3 mg/dL — ABNORMAL LOW (ref 8.9–10.3)
Chloride: 97 mmol/L — ABNORMAL LOW (ref 98–111)
Creatinine, Ser: 0.5 mg/dL (ref 0.44–1.00)
GFR, Estimated: >60 mL/min (ref >60)
Glucose, Bld: 88 mg/dL (ref 70–99)
Potassium: 3.1 mmol/L — ABNORMAL LOW (ref 3.5–5.1)
Sodium: 136 mmol/L (ref 135–145)
Total Bilirubin: 1 mg/dL (ref 0.3–1.2)
Total Protein: 6 g/dL — ABNORMAL LOW (ref 6.5–8.1)

## 2022-02-23 LAB — LIPASE, BLOOD: Lipase: 22 U/L (ref 11–51)

## 2022-02-23 LAB — C1 ESTERASE INHIBITOR: C1INH SerPl-mCnc: 35 mg/dL (ref 21–39)

## 2022-02-23 MED ORDER — ONDANSETRON HCL 4 MG/2ML IJ SOLN
4.0000 mg | Freq: Once | INTRAMUSCULAR | Status: AC
  Administered 2022-02-23: 4 mg via INTRAVENOUS
  Filled 2022-02-23: qty 2

## 2022-02-23 NOTE — ED Triage Notes (Signed)
Pt presents via EMS with complaints of vomiting starting this morning. Pt states its a thick brown colored sputum. Pt denies nausea at this time. Of note, the patient was D/C from here last night. Wears 2L [place] chronically. Denies CP or SOB.

## 2022-02-23 NOTE — ED Notes (Signed)
Pt vomiting coffee brown emesis

## 2022-02-24 ENCOUNTER — Inpatient Hospital Stay
Admission: EM | Admit: 2022-02-24 | Discharge: 2022-03-01 | DRG: 690 | Disposition: A | Discharge status: Home / Self Care | Payer: Medicaid Other | Attending: Internal Medicine | Admitting: Internal Medicine

## 2022-02-24 ENCOUNTER — Encounter: Payer: Self-pay | Admitting: Internal Medicine

## 2022-02-24 DIAGNOSIS — T783XXA Angioneurotic edema, initial encounter: Secondary | ICD-10-CM | POA: Diagnosis present

## 2022-02-24 DIAGNOSIS — F32A Depression, unspecified: Secondary | ICD-10-CM | POA: Diagnosis present

## 2022-02-24 DIAGNOSIS — N3000 Acute cystitis without hematuria: Secondary | ICD-10-CM

## 2022-02-24 DIAGNOSIS — J449 Chronic obstructive pulmonary disease, unspecified: Secondary | ICD-10-CM | POA: Diagnosis not present

## 2022-02-24 DIAGNOSIS — I639 Cerebral infarction, unspecified: Secondary | ICD-10-CM

## 2022-02-24 DIAGNOSIS — Z5900 Homelessness unspecified: Secondary | ICD-10-CM

## 2022-02-24 DIAGNOSIS — R112 Nausea with vomiting, unspecified: Secondary | ICD-10-CM

## 2022-02-24 DIAGNOSIS — E785 Hyperlipidemia, unspecified: Secondary | ICD-10-CM | POA: Diagnosis present

## 2022-02-24 DIAGNOSIS — F319 Bipolar disorder, unspecified: Secondary | ICD-10-CM | POA: Diagnosis not present

## 2022-02-24 DIAGNOSIS — Z9889 Other specified postprocedural states: Secondary | ICD-10-CM

## 2022-02-24 DIAGNOSIS — E876 Hypokalemia: Secondary | ICD-10-CM | POA: Diagnosis present

## 2022-02-24 DIAGNOSIS — N390 Urinary tract infection, site not specified: Secondary | ICD-10-CM

## 2022-02-24 DIAGNOSIS — R2990 Unspecified symptoms and signs involving the nervous system: Secondary | ICD-10-CM | POA: Diagnosis present

## 2022-02-24 LAB — URINALYSIS, ROUTINE W REFLEX MICROSCOPIC
Bilirubin Urine: NEGATIVE
Glucose, UA: NEGATIVE mg/dL
Ketones, ur: 5 mg/dL — AB
Nitrite: NEGATIVE
Protein, ur: 30 mg/dL — AB
Specific Gravity, Urine: 1.02 (ref 1.005–1.030)
WBC, UA: >50 WBC/hpf — ABNORMAL HIGH (ref 0–5)
pH: 5 (ref 5.0–8.0)

## 2022-02-24 LAB — RESP PANEL BY RT-PCR (FLU A&B, COVID) ARPGX2
Influenza A by PCR: NEGATIVE
Influenza B by PCR: NEGATIVE
SARS Coronavirus 2 by RT PCR: NEGATIVE

## 2022-02-24 LAB — MAGNESIUM: Magnesium: 1.7 mg/dL (ref 1.7–2.4)

## 2022-02-24 MED ORDER — ADULT MULTIVITAMIN W/MINERALS CH
1.0000 | ORAL_TABLET | Freq: Every day | ORAL | Status: DC
  Administered 2022-02-24: 1 via ORAL
  Filled 2022-02-24 (×3): qty 1

## 2022-02-24 MED ORDER — FOLIC ACID 1 MG PO TABS
5.0000 mg | ORAL_TABLET | Freq: Every day | ORAL | Status: DC
  Administered 2022-02-24: 5 mg via ORAL
  Filled 2022-02-24 (×3): qty 5

## 2022-02-24 MED ORDER — SODIUM CHLORIDE 0.9 % IV SOLN: INTRAVENOUS | Status: DC

## 2022-02-24 MED ORDER — PROMETHAZINE HCL 25 MG PO TABS: 25.0000 mg | ORAL_TABLET | Freq: Once | ORAL | Status: DC

## 2022-02-24 MED ORDER — DULOXETINE HCL 30 MG PO CPEP
30.0000 mg | ORAL_CAPSULE | Freq: Every day | ORAL | Status: DC
  Administered 2022-02-24: 30 mg via ORAL
  Filled 2022-02-24 (×3): qty 1

## 2022-02-24 MED ORDER — LORAZEPAM 2 MG/ML IJ SOLN
0.5000 mg | Freq: Four times a day (QID) | INTRAMUSCULAR | Status: DC | PRN
  Administered 2022-02-24 – 2022-02-27 (×3): 0.5 mg via INTRAVENOUS
  Filled 2022-02-24 (×3): qty 1

## 2022-02-24 MED ORDER — CEFTRIAXONE SODIUM 1 G IJ SOLR
1.0000 g | INTRAMUSCULAR | Status: DC
Start: 2022-02-24 — End: 2022-02-27
  Administered 2022-02-24 – 2022-02-27 (×4): 1 g via INTRAVENOUS
  Filled 2022-02-24 (×4): qty 10

## 2022-02-24 MED ORDER — ATORVASTATIN CALCIUM 20 MG PO TABS
80.0000 mg | ORAL_TABLET | Freq: Every day | ORAL | Status: DC
  Administered 2022-02-24: 80 mg via ORAL
  Filled 2022-02-24 (×3): qty 4

## 2022-02-24 MED ORDER — ACETAMINOPHEN 650 MG RE SUPP: 650.0000 mg | Freq: Four times a day (QID) | RECTAL | Status: DC | PRN

## 2022-02-24 MED ORDER — ALBUTEROL SULFATE (2.5 MG/3ML) 0.083% IN NEBU: 3.0000 mL | INHALATION_SOLUTION | RESPIRATORY_TRACT | Status: DC | PRN

## 2022-02-24 MED ORDER — ONDANSETRON HCL 4 MG/2ML IJ SOLN
4.0000 mg | Freq: Four times a day (QID) | INTRAMUSCULAR | Status: DC | PRN
  Administered 2022-02-24 – 2022-03-01 (×13): 4 mg via INTRAVENOUS
  Filled 2022-02-24 (×14): qty 2

## 2022-02-24 MED ORDER — LORATADINE 10 MG PO TABS
10.0000 mg | ORAL_TABLET | Freq: Every evening | ORAL | Status: DC
  Filled 2022-02-24 (×2): qty 1

## 2022-02-24 MED ORDER — LACTATED RINGERS IV BOLUS
1000.0000 mL | Freq: Once | INTRAVENOUS | Status: AC
Start: 2022-02-24 — End: 2022-02-24
  Administered 2022-02-24: 1000 mL via INTRAVENOUS

## 2022-02-24 MED ORDER — BUDESONIDE 0.25 MG/2ML IN SUSP
0.2500 mg | Freq: Two times a day (BID) | RESPIRATORY_TRACT | Status: DC
  Administered 2022-02-24 – 2022-03-01 (×10): 0.25 mg via RESPIRATORY_TRACT
  Filled 2022-02-24 (×11): qty 2

## 2022-02-24 MED ORDER — FLUTICASONE PROPIONATE HFA 110 MCG/ACT IN AERO
2.0000 | INHALATION_SPRAY | Freq: Two times a day (BID) | RESPIRATORY_TRACT | Status: DC
Start: 2022-02-24 — End: 2022-02-24

## 2022-02-24 MED ORDER — POLYETHYLENE GLYCOL 3350 17 G PO PACK: 17.0000 g | PACK | Freq: Two times a day (BID) | ORAL | Status: DC | PRN

## 2022-02-24 MED ORDER — METOCLOPRAMIDE HCL 5 MG/ML IJ SOLN
10.0000 mg | Freq: Once | INTRAMUSCULAR | Status: AC
  Administered 2022-02-24: 10 mg via INTRAVENOUS
  Filled 2022-02-24: qty 2

## 2022-02-24 MED ORDER — POTASSIUM CHLORIDE CRYS ER 20 MEQ PO TBCR
40.0000 meq | EXTENDED_RELEASE_TABLET | Freq: Once | ORAL | Status: DC
  Filled 2022-02-24: qty 2

## 2022-02-24 MED ORDER — TIOTROPIUM BROMIDE MONOHYDRATE 18 MCG IN CAPS
1.0000 | ORAL_CAPSULE | Freq: Every day | RESPIRATORY_TRACT | Status: DC
  Filled 2022-02-24: qty 5

## 2022-02-24 MED ORDER — CEFTRIAXONE SODIUM 1 G IJ SOLR: 2.0000 g | Freq: Once | INTRAMUSCULAR | 0 refills | Status: DC

## 2022-02-24 MED ORDER — ENOXAPARIN SODIUM 40 MG/0.4ML IJ SOSY
40.0000 mg | PREFILLED_SYRINGE | INTRAMUSCULAR | Status: DC
  Administered 2022-02-24 – 2022-03-01 (×6): 40 mg via SUBCUTANEOUS
  Filled 2022-02-24 (×6): qty 0.4

## 2022-02-24 MED ORDER — ACETAMINOPHEN 325 MG PO TABS: 650.0000 mg | ORAL_TABLET | Freq: Four times a day (QID) | ORAL | Status: DC | PRN

## 2022-02-24 MED ORDER — PANTOPRAZOLE SODIUM 40 MG PO TBEC
40.0000 mg | DELAYED_RELEASE_TABLET | Freq: Two times a day (BID) | ORAL | Status: DC
  Administered 2022-02-24: 40 mg via ORAL
  Filled 2022-02-24 (×2): qty 1

## 2022-02-24 MED ORDER — FAMOTIDINE 20 MG PO TABS
20.0000 mg | ORAL_TABLET | Freq: Two times a day (BID) | ORAL | Status: DC
  Administered 2022-02-24 – 2022-03-01 (×2): 20 mg via ORAL
  Filled 2022-02-24 (×7): qty 1

## 2022-02-24 MED ORDER — OYSTER SHELL CALCIUM/D3 500-5 MG-MCG PO TABS
1.0000 | ORAL_TABLET | Freq: Every day | ORAL | Status: DC
  Filled 2022-02-24 (×2): qty 1

## 2022-02-24 MED ORDER — TRAZODONE HCL 100 MG PO TABS: 100.0000 mg | ORAL_TABLET | Freq: Every evening | ORAL | Status: DC | PRN

## 2022-02-24 MED ORDER — POTASSIUM CHLORIDE 20 MEQ PO PACK
40.0000 meq | PACK | Freq: Once | ORAL | Status: AC
  Administered 2022-02-24: 40 meq via ORAL
  Filled 2022-02-24: qty 2

## 2022-02-24 MED ORDER — DM-GUAIFENESIN ER 30-600 MG PO TB12
1.0000 | ORAL_TABLET | Freq: Two times a day (BID) | ORAL | Status: DC | PRN
  Filled 2022-02-24: qty 1

## 2022-02-24 MED ORDER — ONDANSETRON HCL 4 MG/2ML IJ SOLN
4.0000 mg | Freq: Once | INTRAMUSCULAR | Status: AC
  Administered 2022-02-24: 4 mg via INTRAVENOUS
  Filled 2022-02-24: qty 2

## 2022-02-24 NOTE — ED Provider Notes (Signed)
[facility] Provider Note    Event Date/Time   First MD Initiated Contact with Patient 02/24/22 0126     (approximate)   History   Emesis   HPI  KHOLE MEGA is a 61 y.o. female with history of CVA, COPD, bipolar disorder who presents to the emergency department her husband for complaints of nausea and vomiting today.  She denies any chest pain, shortness of breath, fevers, chills, cough, abdominal pain, diarrhea.  She was just recently admitted to the hospital for angioedema and states she is still having some soreness to her lips, throat and tongue but her swelling has improved and she is able to speak, breathe and swallow without difficulty.  She has had previous hysterectomy, C-sections.   History provided by patient and husband.    Past Medical History:  Diagnosis Date   Asthma    Bipolar 1 disorder ([place])    COPD (chronic obstructive pulmonary disease) (HCC)    CVA (cerebral vascular accident) ([place])    Enlarged heart    GERD (gastroesophageal reflux disease)    Migraines    Sleep apnea     Past Surgical History:  Procedure Laterality Date   ABDOMINAL HYSTERECTOMY     ABDOMINAL SURGERY     "For acid reflux"   CESAREAN SECTION     ESOPHAGOGASTRODUODENOSCOPY N/A 02/13/2022   Procedure: ESOPHAGOGASTRODUODENOSCOPY (EGD);  Surgeon: Toledo, Benay Pike, MD;  Location: [facility] ENDOSCOPY;  Service: Gastroenterology;  Laterality: N/A;   OTHER SURGICAL HISTORY  01/09/1998   acid reflux, hole in stomach they closed    MEDICATIONS:  Prior to Admission medications   Medication Sig Start Date End Date Taking? Authorizing Provider  acetaminophen (TYLENOL) 325 MG tablet Take 2 tablets (650 mg total) by mouth every 6 (six) hours as needed for mild pain (or Fever >/= 101). 11/08/21   Nolberto Hanlon, MD  atorvastatin (LIPITOR) 80 MG tablet Take 80 mg by mouth daily.    [provider]  calcium-vitamin D (OSCAL WITH D) 500-5 MG-MCG tablet Take 1  tablet by mouth daily with breakfast. 02/23/22   Fritzi Mandes, MD  diclofenac Sodium (VOLTAREN) 1 % GEL Apply 4 g topically 4 (four) times daily. 11/08/21   Nolberto Hanlon, MD  diphenhydrAMINE (BENADRYL) 12.5 MG/5ML elixir Take 10 mLs (25 mg total) by mouth 4 (four) times daily as needed. 02/18/22   Rada Hay, MD  DULoxetine (CYMBALTA) 30 MG capsule Take 30 mg by mouth daily. 09/05/21 09/05/22  [provider]  famotidine (PEPCID) 20 MG tablet Take 1 tablet (20 mg total) by mouth 2 (two) times daily. 02/05/22   Carrie Mew, MD  FLOVENT HFA 110 MCG/ACT inhaler Inhale 2 puffs into the lungs 2 (two) times daily. 10/20/21   [provider]  folic acid (FOLVITE) 1 MG tablet Take 5 tablets (5 mg total) by mouth daily. 11/09/21   Nolberto Hanlon, MD  loratadine (CLARITIN) 10 MG tablet Take 1 tablet (10 mg total) by mouth every evening. 11/08/21   Nolberto Hanlon, MD  Multiple Vitamin (MULTIVITAMIN WITH MINERALS) TABS tablet Take 1 tablet by mouth daily. 11/09/21   Nolberto Hanlon, MD  Nutritional Supplements (FEEDING SUPPLEMENT, KATE FARMS STANDARD 1.4,) LIQD liquid Take 325 mLs by mouth 3 (three) times daily between meals. 11/08/21   Nolberto Hanlon, MD  ondansetron (ZOFRAN-ODT) 4 MG disintegrating tablet Take 1 tablet (4 mg total) by mouth every 8 (eight) hours as needed for nausea or vomiting. 02/17/22   Enzo Bi,  MD  pantoprazole (PROTONIX) 40 MG tablet Take 40 mg by mouth 2 (two) times daily. 08/17/21   [provider]  polyethylene glycol (MIRALAX / GLYCOLAX) 17 g packet Take 17 g by mouth 2 (two) times daily as needed. 02/17/22   Darlin PriestlyLai, Tina, MD  potassium chloride (KLOR-CON) 20 MEQ packet Take 40 mEq by mouth daily for 3 days. 02/23/22 02/26/22  Enedina FinnerPatel, Sona, MD  predniSONE (DELTASONE) 20 MG tablet Take 1 tablet (20 mg total) by mouth daily with breakfast for 4 days. 02/23/22 02/27/22  Enedina FinnerPatel, Sona, MD  SPIRIVA HANDIHALER 18 MCG inhalation capsule 1 capsule at bedtime. 08/02/21   [provider]  traZODone (DESYREL) 100 MG tablet Take 1 tablet (100 mg total) by mouth at bedtime as needed for sleep. 11/08/21   Lynn ItoAmery, Sahar, MD  Vitamin D, Ergocalciferol, (DRISDOL) 1.25 MG (50000 UNIT) CAPS capsule Take 1 capsule (50,000 Units total) by mouth every 7 (seven) days. 11/13/21   Lynn ItoAmery, Sahar, MD    Physical Exam   Triage Vital Signs: ED Triage Vitals  Enc Vitals Group     BP 02/23/22 2247 135/73     Pulse Rate 02/23/22 2247 81     Resp 02/23/22 2247 18     Temp 02/23/22 2247 98.3 F (36.8 C)     Temp Source 02/23/22 2247 Oral     SpO2 02/23/22 2247 99 %     Weight 02/23/22 2245 210 lb 1.6 oz (95.3 kg)     Height 02/23/22 2245 5\' 9"  (1.753 m)     Head Circumference --      Peak Flow --      Pain Score 02/23/22 2244 6     Pain Loc --      Pain Edu? --      Excl. in GC? --     Most recent vital signs: Vitals:   02/24/22 0500 02/24/22 0600  BP: 130/88 118/65  Pulse: 86 82  Resp: 19 20  Temp:    SpO2: 100% 100%    CONSTITUTIONAL: Alert and oriented and responds appropriately to questions.  Obese, chronically ill-appearing HEAD: Normocephalic, atraumatic EYES: Conjunctivae clear, pupils appear equal, sclera nonicteric ENT: normal nose; moist mucous membranes, no angioedema, normal phonation, no stridor, no trismus or drooling NECK: Supple, normal ROM CARD: RRR; S1 and S2 appreciated; no murmurs, no clicks, no rubs, no gallops RESP: Normal chest excursion without splinting or tachypnea; breath sounds clear and equal bilaterally; no wheezes, no rhonchi, no rales, no hypoxia or respiratory distress, speaking full sentences ABD/GI: Normal bowel sounds; non-distended; soft, non-tender, no rebound, no guarding, no peritoneal signs BACK: The back appears normal EXT: Normal ROM in all joints; no deformity noted, no edema; no cyanosis SKIN: Normal color for age and race; warm; no rash on exposed skin NEURO: Moves all extremities equally, normal speech PSYCH: The  patient's mood and manner are appropriate.   ED Results / Procedures / Treatments   LABS: (all labs ordered are listed, but only abnormal results are displayed) Labs Reviewed  COMPREHENSIVE METABOLIC PANEL - Abnormal; Notable for the following components:      Result Value   Potassium 3.1 (*)    Chloride 97 (*)    Calcium 8.3 (*)    Total Protein 6.0 (*)    Albumin 2.6 (*)    All other components within normal limits  CBC - Abnormal; Notable for the following components:   RBC 3.48 (*)    Hemoglobin 9.6 (*)  HCT 29.6 (*)    All other components within normal limits  URINALYSIS, ROUTINE W REFLEX MICROSCOPIC - Abnormal; Notable for the following components:   Color, Urine AMBER (*)    APPearance CLOUDY (*)    Hgb urine dipstick SMALL (*)    Ketones, ur 5 (*)    Protein, ur 30 (*)    Leukocytes,Ua MODERATE (*)    WBC, UA >50 (*)    Bacteria, UA MANY (*)    All other components within normal limits  URINE CULTURE  RESP PANEL BY RT-PCR (FLU A&B, COVID) ARPGX2  LIPASE, BLOOD  MAGNESIUM     EKG:   RADIOLOGY: My personal review and interpretation of imaging:    I have personally reviewed all radiology reports.   No results found.   PROCEDURES:  Critical Care performed: No   CRITICAL CARE Performed by: Pryor Curia   Total critical care time: 0 minutes  Critical care time was exclusive of separately billable procedures and treating other patients.  Critical care was necessary to treat or prevent imminent or life-threatening deterioration.  Critical care was time spent personally by me on the following activities: development of treatment plan with patient and/or surrogate as well as nursing, discussions with consultants, evaluation of patient's response to treatment, examination of patient, obtaining history from patient or surrogate, ordering and performing treatments and interventions, ordering and review of laboratory studies, ordering and review of  radiographic studies, pulse oximetry and re-evaluation of patient's condition.   Procedures    IMPRESSION / MDM / ASSESSMENT AND PLAN / ED COURSE  I reviewed the triage vital signs and the nursing notes.    Patient with nausea and vomiting.  No abdominal pain.     DIFFERENTIAL DIAGNOSIS (includes but not limited to):   Viral gastroenteritis, gastritis, UTI, dehydration, less likely colitis, bowel obstruction, diverticulitis, appendicitis, perforation based on benign abdominal exam   PLAN: We will obtain CBC, CMP, lipase, urinalysis.  Will give IV fluids.  She has already received Zofran from triage and is no longer vomiting but states that she is still feeling poorly.  Will give Reglan.   MEDICATIONS GIVEN IN ED: Medications  0.9 %  sodium chloride infusion (has no administration in time range)  promethazine (PHENERGAN) tablet 25 mg (has no administration in time range)  cefTRIAXone (ROCEPHIN) 1 g in sodium chloride 0.9 % 100 mL IVPB (has no administration in time range)  ondansetron (ZOFRAN) injection 4 mg (has no administration in time range)  LORazepam (ATIVAN) injection 0.5 mg (has no administration in time range)  acetaminophen (TYLENOL) tablet 650 mg (has no administration in time range)    Or  acetaminophen (TYLENOL) suppository 650 mg (has no administration in time range)  ondansetron (ZOFRAN) injection 4 mg (4 mg Intravenous Given 02/23/22 2348)  metoCLOPramide (REGLAN) injection 10 mg (10 mg Intravenous Given 02/24/22 0214)  lactated ringers bolus 1,000 mL (0 mLs Intravenous Stopped 02/24/22 0330)  potassium chloride (KLOR-CON) packet 40 mEq (40 mEq Oral Given 02/24/22 0314)  ondansetron (ZOFRAN) injection 4 mg (4 mg Intravenous Given 02/24/22 0456)     ED COURSE: Patient's labs show mild hypokalemia.  Given oral replacement.  Magnesium normal.  No leukocytosis.  Stable anemia.  Patient's urine appears infected.  Culture has been sent.  Will give Rocephin.  She is still  feeling symptomatic despite Zofran x2 and Reglan.  We will give oral Phenergan as we do not have IV or IM Phenergan at this time due to a  Producer, television/film/video.  Have offered admission versus discharge home with medications as an outpatient but she would prefer admission at this time.   CONSULTS:  Consulted and discussed patient's case with hospitalist, Dr. Glo Herring.  I have recommended admission and consulting physician agrees and will place admission orders.  Patient (and family if present) agree with this plan.   I reviewed all nursing notes, vitals, pertinent previous records.  All labs, EKGs, imaging ordered have been independently reviewed and interpreted by myself.    OUTSIDE RECORDS REVIEWED: Reviewed patient's recent admission on 02/21/2022 to 02/22/2022 for angioedema.         FINAL CLINICAL IMPRESSION(S) / ED DIAGNOSES   Final diagnoses:  Acute UTI  Vomiting with nausea, not intractable     Rx / DC Orders   ED Discharge Orders          Ordered    cefTRIAXone (ROCEPHIN) 1 g injection   Once        02/24/22 [id number]             Note:  This document was prepared using Dragon voice recognition software and may include unintentional dictation errors.   Solaris Kram, Delice Bison, DO 02/24/22 [phone]

## 2022-02-24 NOTE — ED Notes (Signed)
RN to bedside to introduce self to patient. Pt sleeping.

## 2022-02-24 NOTE — Progress Notes (Signed)
°  Carryover admission to the Day Admitter.  I discussed this case with the EDP, Dr. Leonides Schanz.  Per these discussions:   This is a 61 year old female who is being admitted for overnight observation for further evaluation management urinary tract infection complicated by refractory nausea/vomiting compromising her ability to take p.o. and be discharged on oral antibiotics at this time.   She presented with 1 to 2 days of recurrent nausea resulting in multiple episodes of nonbloody, nonbilious emesis.  Ensuing work-up notable for urinalysis that was suggestive of UTI in the context of significant pyuria.  Continues to have residual nausea resulting in inability to tolerate p.o. at this time in spite of 2 doses of Zofran and 1 dose of Reglan in the ED.  Consequently, patient verbalizes her concern for being able to tolerate an oral antibiotic at this time.  Presentation also notable for mild hypokalemia.  Subsequently, I placed order for overnight observation for further evaluation management of acute cystitis and refractory nausea/vomiting. I have placed some preliminary additional admit orders via the adult multi morbid admission order set.  I have ordered Rocephin.  As IV Phenergan is not currently available due to supply issues, I have ordered prn IV Ativan for nausea/vomiting refractory to Zofran.     Babs Bertin, DO Hospitalist

## 2022-02-24 NOTE — ED Notes (Signed)
Patient removed her oxygen that she normally wears 24/7 at home at 2lpm via [place]. Patient sats have remained 98-100% on room air. Will continue to monitor her sats while off of her oxygen.

## 2022-02-24 NOTE — ED Notes (Signed)
Patient drank the majority of her potassium packets without vomiting. The patient refused the potassium pills because they were too big. The patient's "vomit" is spit that she is allowing to come out of her mouth but there is no vomit currently. This nurse also encouraged the patient to produce a urine specimen but both her and her husband state she has had little to nothing to drink in the last 24 hours. Patient currently has fluids infusing.

## 2022-02-24 NOTE — ED Notes (Signed)
Fluid challenge completed at 0315 with po liquid medication. Patient tolerated 4 oz of fluid without vomiting.

## 2022-02-24 NOTE — ED Notes (Signed)
Patient resting on stretcher in room. RR even and unlabored. Patient is on 2lpm oxygen as at home via . Patient c/o nausea and last received Zofran at around 2350. Patient denies abdominal pain. Patient states she is wearing a brief and is normally incontinent of urine. This nurse place a Purewick on her in order to keep her clean and dry. Patient's brief is dry at this time. Patient currently has two fleece blankets on as at home.

## 2022-02-24 NOTE — ED Notes (Signed)
In and out cath performed on patient after she received fluid bolus and still had not produced any urine. Patient tolerated procedure well.

## 2022-02-24 NOTE — ED Notes (Signed)
Provider at bedside to update patient on test results and plan of care

## 2022-02-24 NOTE — H&P (Signed)
History and Physical    Tiffany [name] [id number] DOB: [birth year] DOA: 02/24/2022  Referring MD/NP/PA:   PCP: Nelle Don, MD   Patient coming from:  The patient is coming from motel, Homeless  Chief Complaint: Nausea, vomiting  HPI: Tiffany [name] is a 61 y.o. female with medical history significant of hyperlipidemia, COPD on 2 L oxygen, asthma, stroke, GERD, depression, migraine headache, bipolar, obesity with BMI 31.01, esophagitis, tongue swelling due to possible angioedema, who presents with nausea, vomiting.  Patient was recently hospitalized from 2/22 - 2/23 due to tongue swelling secondary to possible angioedema.  Patient was treated with steroid, Benadryl and Pepcid.  She refused scope by ENT.  Symptoms has improved.  Patient comes back today due to nausea and vomiting.  She states that she has had intractable nausea and nonbilious nonbloody vomiting, many times each day.  She cannot keep anything down.  No diarrhea or abdominal pain.  No fever or chills.  Denies chest pain, cough, shortness of breath.  No symptoms of UTI.  EGD by Dr. Alice Reichert on 02/13/22: A non-bleeding diverticulum with a small opening and no stigmata of recent bleeding was found in the distal esophagus. A small area of extrinsic compression was found in the distal esophagus.   Data Reviewed and ED Course: pt was found to have WBC 6.6, positive urine analysis (cloudy appearance, moderate amount of leukocyte, many bacteria, WBC> 50), negative COVID PCR, potassium 3.1, magnesium of 1.7, GFR > 60, temperature normal, blood pressure 118/65, heart rate 124, RR 20, oxygen saturation 99% on room air.  Patient is placed on telemetry bed for observation.  EKG:  Not done in ED, will get one.     Review of Systems:   General: no fevers, chills, no body weight gain, has poor appetite, has fatigue HEENT: no blurry vision, hearing changes or sore throat Respiratory: no dyspnea, coughing, wheezing CV: no chest  pain, no palpitations GI: has nausea, vomiting, no abdominal pain, diarrhea, constipation GU: no dysuria, burning on urination, increased urinary frequency, hematuria  Ext: no leg edema Neuro: no unilateral weakness, numbness, or tingling, no vision change or hearing loss Skin: no rash, no skin tear. MSK: No muscle spasm, no deformity, no limitation of range of movement in spin Heme: No easy bruising.  Travel history: No recent long distant travel.   Allergy:  Allergies  Allergen Reactions   Dairy Aid [Tilactase] Other (See Comments)    Exacerbates asthma   Egg [Eggs Or Egg-Derived Products] Other (See Comments)    Exacerbated asthma   Milk-Related Compounds     Past Medical History:  Diagnosis Date   Asthma    Bipolar 1 disorder ([place])    COPD (chronic obstructive pulmonary disease) (HCC)    CVA (cerebral vascular accident) ([place])    Enlarged heart    GERD (gastroesophageal reflux disease)    Migraines    Sleep apnea     Past Surgical History:  Procedure Laterality Date   ABDOMINAL HYSTERECTOMY     ABDOMINAL SURGERY     "For acid reflux"   CESAREAN SECTION     ESOPHAGOGASTRODUODENOSCOPY N/A 02/13/2022   Procedure: ESOPHAGOGASTRODUODENOSCOPY (EGD);  Surgeon: Toledo, Benay Pike, MD;  Location: [facility] ENDOSCOPY;  Service: Gastroenterology;  Laterality: N/A;   OTHER SURGICAL HISTORY  01/09/1998   acid reflux, hole in stomach they closed    Social History:  reports that she has never smoked. She has never used smokeless tobacco. She reports that she does not  drink alcohol and does not use drugs.  Family History:  Family History  Problem Relation Age of Onset   Hypertension Mother      Prior to Admission medications   Medication Sig Start Date End Date Taking? Authorizing Provider  acetaminophen (TYLENOL) 325 MG tablet Take 2 tablets (650 mg total) by mouth every 6 (six) hours as needed for mild pain (or Fever >/= 101). 11/08/21  Yes Nolberto Hanlon, MD  atorvastatin  (LIPITOR) 80 MG tablet Take 80 mg by mouth daily.   Yes [provider]  calcium-vitamin D (OSCAL WITH D) 500-5 MG-MCG tablet Take 1 tablet by mouth daily with breakfast. 02/23/22  Yes Fritzi Mandes, MD  cefTRIAXone (ROCEPHIN) 1 g injection Inject 2 g into the muscle once for 1 dose. 02/24/22 02/24/22 Yes Ward, Delice Bison, DO  diclofenac Sodium (VOLTAREN) 1 % GEL Apply 4 g topically 4 (four) times daily. 11/08/21  Yes Nolberto Hanlon, MD  diphenhydrAMINE (BENADRYL) 12.5 MG/5ML elixir Take 10 mLs (25 mg total) by mouth 4 (four) times daily as needed. 02/18/22  Yes Rada Hay, MD  DULoxetine (CYMBALTA) 30 MG capsule Take 30 mg by mouth daily. 09/05/21 09/05/22 Yes [provider]  famotidine (PEPCID) 20 MG tablet Take 1 tablet (20 mg total) by mouth 2 (two) times daily. 02/05/22  Yes Carrie Mew, MD  FLOVENT HFA 110 MCG/ACT inhaler Inhale 2 puffs into the lungs 2 (two) times daily. 10/20/21  Yes [provider]  folic acid (FOLVITE) 1 MG tablet Take 5 tablets (5 mg total) by mouth daily. 11/09/21  Yes Nolberto Hanlon, MD  loratadine (CLARITIN) 10 MG tablet Take 1 tablet (10 mg total) by mouth every evening. 11/08/21  Yes Nolberto Hanlon, MD  Multiple Vitamin (MULTIVITAMIN WITH MINERALS) TABS tablet Take 1 tablet by mouth daily. 11/09/21  Yes Nolberto Hanlon, MD  Nutritional Supplements (FEEDING SUPPLEMENT, KATE FARMS STANDARD 1.4,) LIQD liquid Take 325 mLs by mouth 3 (three) times daily between meals. 11/08/21  Yes Nolberto Hanlon, MD  ondansetron (ZOFRAN-ODT) 4 MG disintegrating tablet Take 1 tablet (4 mg total) by mouth every 8 (eight) hours as needed for nausea or vomiting. 02/17/22  Yes Enzo Bi, MD  pantoprazole (PROTONIX) 40 MG tablet Take 40 mg by mouth 2 (two) times daily. 08/17/21  Yes [provider]  polyethylene glycol (MIRALAX / GLYCOLAX) 17 g packet Take 17 g by mouth 2 (two) times daily as needed. 02/17/22  Yes Enzo Bi, MD  potassium chloride (KLOR-CON) 20 MEQ packet  Take 40 mEq by mouth daily for 3 days. 02/23/22 02/26/22 Yes Fritzi Mandes, MD  predniSONE (DELTASONE) 20 MG tablet Take 1 tablet (20 mg total) by mouth daily with breakfast for 4 days. 02/23/22 02/27/22 Yes Fritzi Mandes, MD  SPIRIVA HANDIHALER 18 MCG inhalation capsule 1 capsule at bedtime. 08/02/21  Yes [provider]  traZODone (DESYREL) 100 MG tablet Take 1 tablet (100 mg total) by mouth at bedtime as needed for sleep. 11/08/21  Yes Nolberto Hanlon, MD  Vitamin D, Ergocalciferol, (DRISDOL) 1.25 MG (50000 UNIT) CAPS capsule Take 1 capsule (50,000 Units total) by mouth every 7 (seven) days. 11/13/21  Yes Nolberto Hanlon, MD    Physical Exam: Vitals:   02/24/22 0600 02/24/22 0630 02/24/22 0700 02/24/22 1135  BP: 118/65 122/62 118/63 120/70  Pulse: 82 82 80 92  Resp: 20   16  Temp:    98.7 F (37.1 C)  TempSrc:    Oral  SpO2: 100% 100% 100% 97%  Weight:      Height:       General: Not in acute distress HEENT:       Eyes: PERRL, EOMI, no scleral icterus.       ENT: No discharge from the ears and nose, no pharynx injection, no tonsillar enlargement.        Neck: No JVD, no bruit, no mass felt. Heme: No neck lymph node enlargement. Cardiac: S1/S2, RRR, No murmurs, No gallops or rubs. Respiratory: No rales, wheezing, rhonchi or rubs. GI: Soft, nondistended, nontender, no rebound pain, no organomegaly, BS present. GU: No hematuria Ext: No pitting leg edema bilaterally. 1+DP/PT pulse bilaterally. Musculoskeletal: No joint deformities, No joint redness or warmth, no limitation of ROM in spin. Skin: No rashes.  Neuro: Alert, oriented X3, cranial nerves II-XII grossly intact, moves all extremities normally. Psych: Patient is not psychotic, no suicidal or hemocidal ideation.  Labs on Admission: I have personally reviewed following labs and imaging studies  CBC: Recent Labs  Lab 02/21/22 1407 02/23/22 2255  WBC 5.4 6.6  HGB 9.4* 9.6*  HCT 28.8* 29.6*  MCV 85.2 85.1  PLT 186 [id number]    Basic Metabolic Panel: Recent Labs  Lab 02/21/22 1109 02/22/22 0413 02/23/22 2255  NA 135 134* 136  K 3.2* 3.4* 3.1*  CL 97* 97* 97*  CO2 28 25 29   GLUCOSE 73 147* 88  BUN 11 10 11   CREATININE 0.56 0.52 0.50  CALCIUM 8.1* 7.7* 8.3*  MG 1.6*  --  1.7   GFR: Estimated Creatinine Clearance: 93 mL/min (by C-G formula based on SCr of 0.5 mg/dL). Liver Function Tests: Recent Labs  Lab 02/23/22 2255  AST 18  ALT 14  ALKPHOS 53  BILITOT 1.0  PROT 6.0*  ALBUMIN 2.6*   Recent Labs  Lab 02/23/22 2255  LIPASE 22   No results for input(s): AMMONIA in the last 168 hours. Coagulation Profile: No results for input(s): INR, PROTIME in the last 168 hours. Cardiac Enzymes: No results for input(s): CKTOTAL, CKMB, CKMBINDEX, TROPONINI in the last 168 hours. BNP (last 3 results) No results for input(s): PROBNP in the last 8760 hours. HbA1C: No results for input(s): HGBA1C in the last 72 hours. CBG: No results for input(s): GLUCAP in the last 168 hours. Lipid Profile: No results for input(s): CHOL, HDL, LDLCALC, TRIG, CHOLHDL, LDLDIRECT in the last 72 hours. Thyroid Function Tests: No results for input(s): TSH, T4TOTAL, FREET4, T3FREE, THYROIDAB in the last 72 hours. Anemia Panel: No results for input(s): VITAMINB12, FOLATE, FERRITIN, TIBC, IRON, RETICCTPCT in the last 72 hours. Urine analysis:    Component Value Date/Time   COLORURINE AMBER (A) 02/24/2022 0450   APPEARANCEUR CLOUDY (A) 02/24/2022 0450   APPEARANCEUR Clear 05/14/2013 1809   LABSPEC 1.020 02/24/2022 0450   LABSPEC 1.013 05/14/2013 1809   PHURINE 5.0 02/24/2022 0450   GLUCOSEU NEGATIVE 02/24/2022 0450   GLUCOSEU Negative 05/14/2013 1809   HGBUR SMALL (A) 02/24/2022 0450   BILIRUBINUR NEGATIVE 02/24/2022 0450   BILIRUBINUR Negative 05/14/2013 1809   KETONESUR 5 (A) 02/24/2022 0450   PROTEINUR 30 (A) 02/24/2022 0450   NITRITE NEGATIVE 02/24/2022 0450   LEUKOCYTESUR MODERATE (A) 02/24/2022 0450    LEUKOCYTESUR Negative 05/14/2013 1809   Sepsis Labs: @LABRCNTIP (procalcitonin:4,lacticidven:4) ) Recent Results (from the past 240 hour(s))  Resp Panel by RT-PCR (Flu A&B, Covid) Nasopharyngeal Swab     Status: None   Collection Time: 02/21/22  3:16 PM   Specimen: Nasopharyngeal Swab; Nasopharyngeal(NP) swabs in vial transport medium  Result Value Ref Range Status   SARS Coronavirus 2 by RT PCR NEGATIVE NEGATIVE Final    Comment: (NOTE) SARS-CoV-2 target nucleic acids are NOT DETECTED.  The SARS-CoV-2 RNA is generally detectable in upper respiratory specimens during the acute phase of infection. The lowest concentration of SARS-CoV-2 viral copies this assay can detect is 138 copies/mL. A negative result does not preclude SARS-Cov-2 infection and should not be used as the sole basis for treatment or other patient management decisions. A negative result may occur with  improper specimen collection/handling, submission of specimen other than nasopharyngeal swab, presence of viral mutation(s) within the areas targeted by this assay, and inadequate number of viral copies(<138 copies/mL). A negative result must be combined with clinical observations, patient history, and epidemiological information. The expected result is Negative.  Fact Sheet for Patients:  EntrepreneurPulse.com.au  Fact Sheet for Healthcare Providers:  IncredibleEmployment.be  This test is no t yet approved or cleared by the [place] FDA and  has been authorized for detection and/or diagnosis of SARS-CoV-2 by FDA under an Emergency Use Authorization (EUA). This EUA will remain  in effect (meaning this test can be used) for the duration of the COVID-19 declaration under Section 564(b)(1) of the Act, 21 U.S.C.section 360bbb-3(b)(1), unless the authorization is terminated  or revoked sooner.       Influenza A by PCR NEGATIVE NEGATIVE Final   Influenza B by PCR NEGATIVE  NEGATIVE Final    Comment: (NOTE) The Xpert Xpress SARS-CoV-2/FLU/RSV plus assay is intended as an aid in the diagnosis of influenza from Nasopharyngeal swab specimens and should not be used as a sole basis for treatment. Nasal washings and aspirates are unacceptable for Xpert Xpress SARS-CoV-2/FLU/RSV testing.  Fact Sheet for Patients: EntrepreneurPulse.com.au  Fact Sheet for Healthcare Providers: IncredibleEmployment.be  This test is not yet approved or cleared by the [place] FDA and has been authorized for detection and/or diagnosis of SARS-CoV-2 by FDA under an Emergency Use Authorization (EUA). This EUA will remain in effect (meaning this test can be used) for the duration of the COVID-19 declaration under Section 564(b)(1) of the Act, 21 U.S.C. section 360bbb-3(b)(1), unless the authorization is terminated or revoked.  Performed at [facility], [address], [place] [address]   Resp Panel by RT-PCR (Flu A&B, Covid) Nasopharyngeal Swab     Status: None   Collection Time: 02/24/22  8:11 AM   Specimen: Nasopharyngeal Swab; Nasopharyngeal(NP) swabs in vial transport medium  Result Value Ref Range Status   SARS Coronavirus 2 by RT PCR NEGATIVE NEGATIVE Final    Comment: (NOTE) SARS-CoV-2 target nucleic acids are NOT DETECTED.  The SARS-CoV-2 RNA is generally detectable in upper respiratory specimens during the acute phase of infection. The lowest concentration of SARS-CoV-2 viral copies this assay can detect is 138 copies/mL. A negative result does not preclude SARS-Cov-2 infection and should not be used as the sole basis for treatment or other patient management decisions. A negative result may occur with  improper specimen collection/handling, submission of specimen other than nasopharyngeal swab, presence of viral mutation(s) within the areas targeted by this assay, and inadequate number of viral copies(<138  copies/mL). A negative result must be combined with clinical observations, patient history, and epidemiological information. The expected result is Negative.  Fact Sheet for Patients:  EntrepreneurPulse.com.au  Fact Sheet for Healthcare Providers:  IncredibleEmployment.be  This test is no t yet approved or cleared by the [place] FDA and  has been authorized for detection  and/or diagnosis of SARS-CoV-2 by FDA under an Emergency Use Authorization (EUA). This EUA will remain  in effect (meaning this test can be used) for the duration of the COVID-19 declaration under Section 564(b)(1) of the Act, 21 U.S.C.section 360bbb-3(b)(1), unless the authorization is terminated  or revoked sooner.       Influenza A by PCR NEGATIVE NEGATIVE Final   Influenza B by PCR NEGATIVE NEGATIVE Final    Comment: (NOTE) The Xpert Xpress SARS-CoV-2/FLU/RSV plus assay is intended as an aid in the diagnosis of influenza from Nasopharyngeal swab specimens and should not be used as a sole basis for treatment. Nasal washings and aspirates are unacceptable for Xpert Xpress SARS-CoV-2/FLU/RSV testing.  Fact Sheet for Patients: EntrepreneurPulse.com.au  Fact Sheet for Healthcare Providers: IncredibleEmployment.be  This test is not yet approved or cleared by the [place] FDA and has been authorized for detection and/or diagnosis of SARS-CoV-2 by FDA under an Emergency Use Authorization (EUA). This EUA will remain in effect (meaning this test can be used) for the duration of the COVID-19 declaration under Section 564(b)(1) of the Act, 21 U.S.C. section 360bbb-3(b)(1), unless the authorization is terminated or revoked.  Performed at [facility], [address], [place], [place] [address]      Radiological Exams on Admission: No results found.    Assessment/Plan Principal Problem:   UTI (urinary tract  infection) Active Problems:   Hypokalemia   Intractable nausea and vomiting   Bipolar 1 disorder (HCC)   Angioedema   HLD (hyperlipidemia)   COPD (chronic obstructive pulmonary disease) (HCC)   Stroke (HCC)   Depression   Homeless   UTI (urinary tract infection): Patient does not meets critical for sepsis  -Admitted to telemetry bed for observation -IV Rocephin -Follow-up with urine culture  Hypokalemia: Potassium 3.1.  Magnesium 1.7 -Repleted potassium  Intractable nausea and vomiting: Etiology is not clear.  May be related to UTI.  Patient recently had EGD -As needed Zofran -IV fluid: 1 L normal saline, followed by 125 cc/h  Bipolar 1 disorder and depression: -Continue Cymbalta,   HLD (hyperlipidemia) -Lipitor   COPD (chronic obstructive pulmonary disease) ([place]): stable -Bronchodilators   History of stroke (HCC) -Lipitor   Obesity (BMI 30-39.9): BMI 31.03 -Diet and exercise.   -Encouraged to lose weight.    Homeless -consult TOC  Angioedema: has resolved.                DVT ppx: SQ Lovenox  Code Status: Full code  Family Communication: not done, no family member is at bed side.   Disposition Plan:  Anticipate discharge back to previous environment, Motel, Homeless  Consults called:  none  Admission status and Level of care: Med-Surg:   for obs     Severity of Illness:  The appropriate patient status for this patient is OBSERVATION. Observation status is judged to be reasonable and necessary in order to provide the required intensity of service to ensure the patient's safety. The patient's presenting symptoms, physical exam findings, and initial radiographic and laboratory data in the context of their medical condition is felt to place them at decreased risk for further clinical deterioration. Furthermore, it is anticipated that the patient will be medically stable for discharge from the hospital within 2 midnights of admission.         Date of Service 02/24/2022    Ivor Costa [facility]   If 7PM-7AM, please contact night-coverage [email or web address] 02/24/2022, 12:37 PM

## 2022-02-25 DIAGNOSIS — K219 Gastro-esophageal reflux disease without esophagitis: Secondary | ICD-10-CM | POA: Diagnosis present

## 2022-02-25 DIAGNOSIS — B9620 Unspecified Escherichia coli [E. coli] as the cause of diseases classified elsewhere: Secondary | ICD-10-CM | POA: Diagnosis present

## 2022-02-25 DIAGNOSIS — T783XXA Angioneurotic edema, initial encounter: Secondary | ICD-10-CM | POA: Diagnosis present

## 2022-02-25 DIAGNOSIS — R112 Nausea with vomiting, unspecified: Secondary | ICD-10-CM

## 2022-02-25 DIAGNOSIS — Z79899 Other long term (current) drug therapy: Secondary | ICD-10-CM | POA: Diagnosis not present

## 2022-02-25 DIAGNOSIS — F319 Bipolar disorder, unspecified: Secondary | ICD-10-CM | POA: Diagnosis present

## 2022-02-25 DIAGNOSIS — K3184 Gastroparesis: Secondary | ICD-10-CM | POA: Diagnosis present

## 2022-02-25 DIAGNOSIS — E669 Obesity, unspecified: Secondary | ICD-10-CM | POA: Diagnosis present

## 2022-02-25 DIAGNOSIS — N3000 Acute cystitis without hematuria: Secondary | ICD-10-CM | POA: Diagnosis not present

## 2022-02-25 DIAGNOSIS — K2210 Ulcer of esophagus without bleeding: Secondary | ICD-10-CM | POA: Diagnosis present

## 2022-02-25 DIAGNOSIS — N390 Urinary tract infection, site not specified: Secondary | ICD-10-CM | POA: Diagnosis present

## 2022-02-25 DIAGNOSIS — Z20822 Contact with and (suspected) exposure to covid-19: Secondary | ICD-10-CM | POA: Diagnosis present

## 2022-02-25 DIAGNOSIS — D638 Anemia in other chronic diseases classified elsewhere: Secondary | ICD-10-CM | POA: Diagnosis present

## 2022-02-25 DIAGNOSIS — Z91011 Allergy to milk products: Secondary | ICD-10-CM | POA: Diagnosis not present

## 2022-02-25 DIAGNOSIS — E876 Hypokalemia: Secondary | ICD-10-CM | POA: Diagnosis not present

## 2022-02-25 DIAGNOSIS — J9611 Chronic respiratory failure with hypoxia: Secondary | ICD-10-CM | POA: Diagnosis present

## 2022-02-25 DIAGNOSIS — Z1619 Resistance to other specified beta lactam antibiotics: Secondary | ICD-10-CM | POA: Diagnosis present

## 2022-02-25 DIAGNOSIS — J449 Chronic obstructive pulmonary disease, unspecified: Secondary | ICD-10-CM | POA: Diagnosis present

## 2022-02-25 DIAGNOSIS — Z6831 Body mass index (BMI) 31.0-31.9, adult: Secondary | ICD-10-CM | POA: Diagnosis not present

## 2022-02-25 DIAGNOSIS — Z9981 Dependence on supplemental oxygen: Secondary | ICD-10-CM | POA: Diagnosis not present

## 2022-02-25 DIAGNOSIS — G4730 Sleep apnea, unspecified: Secondary | ICD-10-CM | POA: Diagnosis present

## 2022-02-25 DIAGNOSIS — Z8673 Personal history of transient ischemic attack (TIA), and cerebral infarction without residual deficits: Secondary | ICD-10-CM | POA: Diagnosis not present

## 2022-02-25 DIAGNOSIS — E785 Hyperlipidemia, unspecified: Secondary | ICD-10-CM | POA: Diagnosis present

## 2022-02-25 DIAGNOSIS — Z91012 Allergy to eggs: Secondary | ICD-10-CM | POA: Diagnosis not present

## 2022-02-25 LAB — BASIC METABOLIC PANEL
Anion gap: 8 (ref 5–15)
BUN: 9 mg/dL (ref 6–20)
CO2: 29 mmol/L (ref 22–32)
Calcium: 7.6 mg/dL — ABNORMAL LOW (ref 8.9–10.3)
Chloride: 99 mmol/L (ref 98–111)
Creatinine, Ser: 0.44 mg/dL (ref 0.44–1.00)
GFR, Estimated: >60 mL/min (ref >60)
Glucose, Bld: 77 mg/dL (ref 70–99)
Potassium: 2.7 mmol/L — CL (ref 3.5–5.1)
Sodium: 136 mmol/L (ref 135–145)

## 2022-02-25 LAB — IRON AND TIBC
Iron: 103 ug/dL (ref 28–170)
Saturation Ratios: 91 % — ABNORMAL HIGH (ref 10.4–31.8)
TIBC: 113 ug/dL — ABNORMAL LOW (ref 250–450)
UIBC: 10 ug/dL

## 2022-02-25 LAB — FERRITIN: Ferritin: 300 ng/mL (ref 11–307)

## 2022-02-25 LAB — MAGNESIUM: Magnesium: 1.4 mg/dL — ABNORMAL LOW (ref 1.7–2.4)

## 2022-02-25 LAB — ALBUMIN: Albumin: 2 g/dL — ABNORMAL LOW (ref 3.5–5.0)

## 2022-02-25 LAB — VITAMIN B12: Vitamin B-12: 574 pg/mL (ref 180–914)

## 2022-02-25 MED ORDER — POTASSIUM CHLORIDE 10 MEQ/100ML IV SOLN
10.0000 meq | INTRAVENOUS | Status: DC
  Administered 2022-02-25 (×4): 10 meq via INTRAVENOUS
  Filled 2022-02-25 (×3): qty 100

## 2022-02-25 MED ORDER — PROCHLORPERAZINE EDISYLATE 10 MG/2ML IJ SOLN
10.0000 mg | INTRAMUSCULAR | Status: DC | PRN
Start: 2022-02-25 — End: 2022-03-01
  Administered 2022-02-27 – 2022-03-01 (×2): 10 mg via INTRAVENOUS
  Filled 2022-02-25 (×5): qty 2

## 2022-02-25 MED ORDER — POTASSIUM CHLORIDE IN NACL 20-0.9 MEQ/L-% IV SOLN
INTRAVENOUS | Status: DC
  Filled 2022-02-25 (×9): qty 1000

## 2022-02-25 MED ORDER — SUCRALFATE 1 GM/10ML PO SUSP
1.0000 g | Freq: Three times a day (TID) | ORAL | Status: DC
  Administered 2022-03-01 (×2): 1 g via ORAL
  Filled 2022-02-25 (×7): qty 10

## 2022-02-25 MED ORDER — LACTULOSE 10 GM/15ML PO SOLN
20.0000 g | Freq: Once | ORAL | Status: AC
  Administered 2022-02-25: 20 g via ORAL
  Filled 2022-02-25: qty 30

## 2022-02-25 MED ORDER — MAGNESIUM SULFATE 4 GM/100ML IV SOLN
4.0000 g | Freq: Once | INTRAVENOUS | Status: AC
  Administered 2022-02-25: 4 g via INTRAVENOUS
  Filled 2022-02-25: qty 100

## 2022-02-25 MED ORDER — PANTOPRAZOLE SODIUM 40 MG IV SOLR
40.0000 mg | Freq: Two times a day (BID) | INTRAVENOUS | Status: DC
  Administered 2022-02-25 – 2022-03-01 (×9): 40 mg via INTRAVENOUS
  Filled 2022-02-25 (×9): qty 10

## 2022-02-25 MED ORDER — SENNOSIDES-DOCUSATE SODIUM 8.6-50 MG PO TABS
2.0000 | ORAL_TABLET | Freq: Two times a day (BID) | ORAL | Status: DC
  Filled 2022-02-25: qty 2

## 2022-02-25 MED ORDER — POTASSIUM CHLORIDE 10 MEQ/100ML IV SOLN
10.0000 meq | INTRAVENOUS | Status: AC
  Administered 2022-02-25: 10 meq via INTRAVENOUS
  Filled 2022-02-25 (×2): qty 100

## 2022-02-25 NOTE — TOC Progression Note (Signed)
Transition of Care [facility]) - Progression Note    Patient Details  Name: Tiffany [name] MRN: [id number] Date of Birth: 10-15-1961  [facility] CM/SW Contact  Joseph Art, [place] Phone Number: [phone] 02/25/2022, 8:36 AM  Clinical Narrative:     Patient remains under observation.  Patient is active with [facility] home health, wears 2L O2 chronically and is currently living at [facility] room [facility]. Main contact Josefine Class, brother/POA [phone].        Expected Discharge Plan and Services                                                 Social Determinants of Health (SDOH) Interventions    Readmission Risk Interventions Readmission Risk Prevention Plan 11/08/2021  Transportation Screening Complete  PCP or Specialist Appt within 5-7 Days Complete  Home Care Screening Complete  Medication Review (RN CM) Complete  Some recent data might be hidden

## 2022-02-25 NOTE — Progress Notes (Signed)
Cross Cover Critical potassium level 2.7. Refuses oral meds. * runs of K ordered. Mage and albumin level pending from blood in lab

## 2022-02-25 NOTE — Progress Notes (Signed)
°  Progress Note   Patient: Tiffany [name] [id number] DOB: 08-18-1961 DOA: 02/24/2022     0 DOS: the patient was seen and examined on 02/25/2022   Brief hospital course: Tiffany [name] is a 61 y.o. female with medical history significant of hyperlipidemia, COPD on 2 L oxygen, asthma, stroke, GERD, depression, migraine headache, bipolar, obesity with BMI 31.01, esophagitis, tongue swelling due to possible angioedema, who presents with nausea, vomiting. Patient had multiple ED visit and admission for nausea vomiting.  Condition got worse for the last few days. Patient was also diagnosed with UTI, started on antibiotics  Assessment and Plan: Intractable nausea vomiting. Hypokalemia. Hypomagnesemia. Esophagitis. Patient had multiple ER visit and admissions due to the same problems.  This has been happening since November 2022. Patient prior EGD showed esophageal ulceration, most likely due to intractable nausea vomiting. Reviewed previous imaging study, patient had a CT head, showed empty sella.  We will check cortisol level. Condition is more consistent with gastroparesis.  Will obtain gastric emptying study. Patient normally has 2 bowel movements per week, has been happening for years.  She does not feel constipated.  However, given her significant nausea and vomiting, I will give her stool softener, try to get 1 bowel movement per day. Patient is reported tolerating diet, I will start IV fluids. NP ordered 80 mEq of KCl IV, will only give 50 mEq for now, continue normal saline with 20 mEq KCl infusion.  Urinary tract infection. Pending culture results, continue Rocephin for now.  Bipolar disorder and depression. Continue Cymbalta.  COPD. Stable.  History of stroke. Continue Lipitor.  Obesity with BMI 31.03. Continue follow-up  Homelessness.       Subjective:  Patient has significant nausea and vomiting, not able to tolerate diet.  She also has constipation. Denies  any short of breath or cough.  Physical Exam: Vitals:   02/24/22 1557 02/24/22 2104 02/25/22 0357 02/25/22 0730  BP: (!) 117/56 124/62 125/76 126/61  Pulse: 86 94 84 82  Resp: 16 20 20 17   Temp: 98.8 F (37.1 C) 98.3 F (36.8 C) 98.3 F (36.8 C) 98.2 F (36.8 C)  TempSrc: Oral     SpO2: 97% 96% 95% 95%  Weight:      Height:       General exam: Appears calm and comfortable  Respiratory system: Clear to auscultation. Respiratory effort normal. Cardiovascular system: S1 & S2 heard, RRR. No JVD, murmurs, rubs, gallops or clicks. No pedal edema. Gastrointestinal system: Abdomen is nondistended, soft and nontender. No organomegaly or masses felt. Normal bowel sounds heard. Central nervous system: Alert and oriented. No focal neurological deficits. Extremities: Symmetric 5 x 5 power. Skin: No rashes, lesions or ulcers Psychiatry: Judgement and insight appear normal. Mood & affect appropriate.    Data Reviewed: Reviewed prior CT head current CT abdomen pelvis with contrast: Empty sella on prior CT scan, prominent appendix without inflammation. Reviewed all labs.  Family Communication: Brother at bedside.  Disposition: Status is: Inpatient Remains inpatient appropriate because: Severity of disease, IV treatment          Planned Discharge Destination: Home     Time spent: 38 minutes  Author: , MD 02/25/2022 11:13 AM  For on call review [email or web address].

## 2022-02-26 DIAGNOSIS — N3000 Acute cystitis without hematuria: Secondary | ICD-10-CM | POA: Diagnosis not present

## 2022-02-26 DIAGNOSIS — R112 Nausea with vomiting, unspecified: Secondary | ICD-10-CM | POA: Diagnosis not present

## 2022-02-26 DIAGNOSIS — E876 Hypokalemia: Secondary | ICD-10-CM | POA: Diagnosis not present

## 2022-02-26 LAB — PHOSPHORUS: Phosphorus: 1.9 mg/dL — ABNORMAL LOW (ref 2.5–4.6)

## 2022-02-26 LAB — CBC WITH DIFFERENTIAL/PLATELET
Abs Immature Granulocytes: 0.01 10*3/uL (ref 0.00–0.07)
Basophils Absolute: 0 10*3/uL (ref 0.0–0.1)
Basophils Relative: 1 %
Eosinophils Absolute: 0.1 10*3/uL (ref 0.0–0.5)
Eosinophils Relative: 3 %
HCT: 25 % — ABNORMAL LOW (ref 36.0–46.0)
Hemoglobin: 8.2 g/dL — ABNORMAL LOW (ref 12.0–15.0)
Immature Granulocytes: 0 %
Lymphocytes Relative: 36 %
Lymphs Abs: 1.2 10*3/uL (ref 0.7–4.0)
MCH: 27.6 pg (ref 26.0–34.0)
MCHC: 32.8 g/dL (ref 30.0–36.0)
MCV: 84.2 fL (ref 80.0–100.0)
Monocytes Absolute: 0.4 10*3/uL (ref 0.1–1.0)
Monocytes Relative: 10 %
Neutro Abs: 1.7 10*3/uL (ref 1.7–7.7)
Neutrophils Relative %: 50 %
Platelets: 174 10*3/uL (ref 150–400)
RBC: 2.97 MIL/uL — ABNORMAL LOW (ref 3.87–5.11)
RDW: 15 % (ref 11.5–15.5)
WBC: 3.4 10*3/uL — ABNORMAL LOW (ref 4.0–10.5)
nRBC: 0 % (ref 0.0–0.2)

## 2022-02-26 LAB — MAGNESIUM: Magnesium: 1.8 mg/dL (ref 1.7–2.4)

## 2022-02-26 LAB — BASIC METABOLIC PANEL
Anion gap: 10 (ref 5–15)
BUN: 6 mg/dL (ref 6–20)
CO2: 29 mmol/L (ref 22–32)
Calcium: 7.6 mg/dL — ABNORMAL LOW (ref 8.9–10.3)
Chloride: 98 mmol/L (ref 98–111)
Creatinine, Ser: 0.49 mg/dL (ref 0.44–1.00)
GFR, Estimated: >60 mL/min (ref >60)
Glucose, Bld: 69 mg/dL — ABNORMAL LOW (ref 70–99)
Potassium: 3 mmol/L — ABNORMAL LOW (ref 3.5–5.1)
Sodium: 137 mmol/L (ref 135–145)

## 2022-02-26 LAB — CORTISOL-AM, BLOOD: Cortisol - AM: 13.7 ug/dL (ref 6.7–22.6)

## 2022-02-26 MED ORDER — POTASSIUM PHOSPHATES 15 MMOLE/5ML IV SOLN
30.0000 mmol | Freq: Once | INTRAVENOUS | Status: AC
  Administered 2022-02-26: 30 mmol via INTRAVENOUS
  Filled 2022-02-26: qty 10

## 2022-02-26 MED ORDER — SENNOSIDES-DOCUSATE SODIUM 8.6-50 MG PO TABS
1.0000 | ORAL_TABLET | Freq: Two times a day (BID) | ORAL | Status: DC
  Filled 2022-02-26 (×2): qty 1

## 2022-02-26 MED ORDER — POTASSIUM CHLORIDE 10 MEQ/100ML IV SOLN
10.0000 meq | INTRAVENOUS | Status: AC
  Administered 2022-02-26 (×3): 10 meq via INTRAVENOUS
  Filled 2022-02-26 (×2): qty 100

## 2022-02-26 NOTE — Progress Notes (Signed)
°  Progress Note   Patient: Tiffany [name] ZOX:[id number] DOB: March 11, 1961 DOA: 02/24/2022     1 DOS: the patient was seen and examined on 02/26/2022   Brief hospital course: Tiffany [name] is a 61 y.o. female with medical history significant of hyperlipidemia, COPD on 2 L oxygen, asthma, stroke, GERD, depression, migraine headache, bipolar, obesity with BMI 31.01, esophagitis, tongue swelling due to possible angioedema, who presents with nausea, vomiting. Patient had multiple ED visit and admission for nausea vomiting.  Condition got worse for the last few days. Patient was also diagnosed with UTI, started on antibiotics  Assessment and Plan: Intractable nausea vomiting. Hypokalemia. Hypomagnesemia. Erosive esophagitis. Hypophosphatemia Patient had multiple loose stools after stool softener.  She still has significant nausea, but tolerating diet. Discussed with radiology, will obtain get gastric empathy study tomorrow. Patient still has significant hypokalemia and hypophosphatemia.  Continue replete, recheck levels tomorrow.  Continue IV fluids.  E. coli urinary tract infection. Pending final culture results, continue Rocephin.  Bipolar disorder and depression. Continue Cymbalta.  COPD. Stable.  History of stroke. Continue Lipitor.  Obesity with BMI 31.03. Continue follow-up  Homelessness.     Subjective:  Patient had a multiple stools after given stool softener.  However, she still have nausea, some vomiting.  She was able to tolerate diet. No fever chills  No short of breath or cough.  Physical Exam: Vitals:   02/25/22 1941 02/25/22 2002 02/26/22 0429 02/26/22 0745  BP: 129/64  132/69 124/66  Pulse: 75  82 80  Resp: 20  17 16   Temp:   98.5 F (36.9 C) 98.2 F (36.8 C)  TempSrc:   Oral   SpO2: 96% 96% 93% 98%  Weight:      Height:       General exam: Appears calm and comfortable  Respiratory system: Clear to auscultation. Respiratory effort  normal. Cardiovascular system: S1 & S2 heard, RRR. No JVD, murmurs, rubs, gallops or clicks. No pedal edema. Gastrointestinal system: Abdomen is nondistended, soft and nontender. No organomegaly or masses felt. Normal bowel sounds heard. Central nervous system: Alert and oriented. No focal neurological deficits. Extremities: Symmetric 5 x 5 power. Skin: No rashes, lesions or ulcers Psychiatry: Judgement and insight appear normal. Mood & affect appropriate.    Data Reviewed: Reviewed all labs. Family Communication:   Disposition: Status is: Inpatient Remains inpatient appropriate because: Severity of disease          Planned Discharge Destination: Home with Home Health     Time spent: 25 minutes  Author: , MD 02/26/2022 9:55 AM  For on call review [email or web address].

## 2022-02-27 ENCOUNTER — Inpatient Hospital Stay: Payer: Medicaid Other

## 2022-02-27 DIAGNOSIS — N3000 Acute cystitis without hematuria: Secondary | ICD-10-CM | POA: Diagnosis not present

## 2022-02-27 DIAGNOSIS — R112 Nausea with vomiting, unspecified: Secondary | ICD-10-CM | POA: Diagnosis not present

## 2022-02-27 DIAGNOSIS — F319 Bipolar disorder, unspecified: Secondary | ICD-10-CM | POA: Diagnosis not present

## 2022-02-27 LAB — BASIC METABOLIC PANEL
Anion gap: 10 (ref 5–15)
BUN: <5 mg/dL — ABNORMAL LOW (ref 6–20)
CO2: 26 mmol/L (ref 22–32)
Calcium: 7.9 mg/dL — ABNORMAL LOW (ref 8.9–10.3)
Chloride: 100 mmol/L (ref 98–111)
Creatinine, Ser: 0.34 mg/dL — ABNORMAL LOW (ref 0.44–1.00)
GFR, Estimated: >60 mL/min (ref >60)
Glucose, Bld: 74 mg/dL (ref 70–99)
Potassium: 3.9 mmol/L (ref 3.5–5.1)
Sodium: 136 mmol/L (ref 135–145)

## 2022-02-27 LAB — MAGNESIUM: Magnesium: 1.6 mg/dL — ABNORMAL LOW (ref 1.7–2.4)

## 2022-02-27 LAB — URINE CULTURE: Culture: >=100000 — AB

## 2022-02-27 LAB — PHOSPHORUS: Phosphorus: 2.9 mg/dL (ref 2.5–4.6)

## 2022-02-27 MED ORDER — FOSFOMYCIN TROMETHAMINE 3 G PO PACK
3.0000 g | PACK | ORAL | Status: DC
  Filled 2022-02-27: qty 3

## 2022-02-27 MED ORDER — MAGNESIUM SULFATE 4 GM/100ML IV SOLN
4.0000 g | Freq: Once | INTRAVENOUS | Status: AC
  Administered 2022-02-27: 4 g via INTRAVENOUS
  Filled 2022-02-27 (×2): qty 100

## 2022-02-27 MED ORDER — METOCLOPRAMIDE HCL 5 MG/ML IJ SOLN
5.0000 mg | Freq: Four times a day (QID) | INTRAMUSCULAR | Status: DC
  Administered 2022-02-27 – 2022-03-01 (×9): 5 mg via INTRAVENOUS
  Filled 2022-02-27 (×9): qty 2

## 2022-02-27 NOTE — Progress Notes (Addendum)
°  Progress Note   Patient: Tiffany [name] QMG:[id number] DOB: 12/16/1961 DOA: 02/24/2022     2 DOS: the patient was seen and examined on 02/27/2022   Brief hospital course: Tiffany [name] is a 61 y.o. female with medical history significant of hyperlipidemia, COPD on 2 L oxygen, asthma, stroke, GERD, depression, migraine headache, bipolar, obesity with BMI 31.01, esophagitis, tongue swelling due to possible angioedema, who presents with nausea, vomiting. Patient had multiple ED visit and admission for nausea vomiting.  Condition got worse for the last few days. Patient was also diagnosed with UTI, started on antibiotics  Assessment and Plan: Intractable nausea vomiting. Presumed gastroparesis Hypokalemia. Hypomagnesemia. Erosive esophagitis. Hypophosphatemia Patient still has severe nausea vomiting, not able to tolerate diet.  Send the patient for gastric empty study, patient could not tolerate due to recurrent nausea vomiting. Patient most likely has gastroparesis, will start IV Reglan. Continue IV fluids for today, may transition to oral Reglan tomorrow if condition improves.  E. coli urinary tract infection. Urine culture came back resistant to Rocephin.  We will give a dose of fosfomycin after nausea vomiting is better.  Anemia of chronic disease Adequate iron and B12 level. Continue to follow-up  Bipolar disorder and depression. Continue Cymbalta.  COPD. Chronic hypoxemic respiratory  failure. Stable.  History of stroke. Continue Lipitor.  Obesity with BMI 31.03. Continue follow-up  Homelessness. Lives in a hotel with her fianc      Subjective:  Patient still has severe nausea vomiting today, could not tolerate gastric emptying study. He does not have abdominal pain or constipation. Denies any shortness of breath.   Physical Exam: Vitals:   02/26/22 1629 02/27/22 0449 02/27/22 0720 02/27/22 0743  BP: 127/66 133/73  121/68  Pulse: 89 90  86  Resp: 18  20  18   Temp: 98.7 F (37.1 C) 98.7 F (37.1 C)  98.4 F (36.9 C)  TempSrc:      SpO2: 97% 95% 97% 95%  Weight:      Height:       General exam: Appears calm and comfortable  Respiratory system: Clear to auscultation. Respiratory effort normal. Cardiovascular system: S1 & S2 heard, RRR. No JVD, murmurs, rubs, gallops or clicks. No pedal edema. Gastrointestinal system: Abdomen is nondistended, soft and nontender. No organomegaly or masses felt. Normal bowel sounds heard. Central nervous system: Alert and oriented. No focal neurological deficits. Extremities: Symmetric 5 x 5 power. Skin: No rashes, lesions or ulcers Psychiatry: Judgement and insight appear normal. Mood & affect appropriate.    Data Reviewed: Reviewed all lab results.  Family Communication: Fianc updated at bedside.  Disposition: Status is: Inpatient Remains inpatient appropriate because: Severity of disease, not tolerating p.o. intake.  Continue IV fluids.          Planned Discharge Destination: Home with Home Health     Time spent: 35 minutes  Author: , MD 02/27/2022 11:40 AM  For on call review [email or web address].

## 2022-02-28 DIAGNOSIS — N3000 Acute cystitis without hematuria: Secondary | ICD-10-CM | POA: Diagnosis not present

## 2022-02-28 DIAGNOSIS — R112 Nausea with vomiting, unspecified: Secondary | ICD-10-CM | POA: Diagnosis not present

## 2022-02-28 LAB — CBC WITH DIFFERENTIAL/PLATELET
Abs Immature Granulocytes: 0.03 10*3/uL (ref 0.00–0.07)
Basophils Absolute: 0 10*3/uL (ref 0.0–0.1)
Basophils Relative: 1 %
Eosinophils Absolute: 0.2 10*3/uL (ref 0.0–0.5)
Eosinophils Relative: 5 %
HCT: 23.5 % — ABNORMAL LOW (ref 36.0–46.0)
Hemoglobin: 7.8 g/dL — ABNORMAL LOW (ref 12.0–15.0)
Immature Granulocytes: 1 %
Lymphocytes Relative: 44 %
Lymphs Abs: 1.5 10*3/uL (ref 0.7–4.0)
MCH: 27.9 pg (ref 26.0–34.0)
MCHC: 33.2 g/dL (ref 30.0–36.0)
MCV: 83.9 fL (ref 80.0–100.0)
Monocytes Absolute: 0.4 10*3/uL (ref 0.1–1.0)
Monocytes Relative: 12 %
Neutro Abs: 1.2 10*3/uL — ABNORMAL LOW (ref 1.7–7.7)
Neutrophils Relative %: 37 %
Platelets: 179 10*3/uL (ref 150–400)
RBC: 2.8 MIL/uL — ABNORMAL LOW (ref 3.87–5.11)
RDW: 15.1 % (ref 11.5–15.5)
WBC: 3.4 10*3/uL — ABNORMAL LOW (ref 4.0–10.5)
nRBC: 0 % (ref 0.0–0.2)

## 2022-02-28 LAB — BASIC METABOLIC PANEL
Anion gap: 4 — ABNORMAL LOW (ref 5–15)
BUN: <5 mg/dL — ABNORMAL LOW (ref 6–20)
CO2: 28 mmol/L (ref 22–32)
Calcium: 7.3 mg/dL — ABNORMAL LOW (ref 8.9–10.3)
Chloride: 101 mmol/L (ref 98–111)
Creatinine, Ser: 0.47 mg/dL (ref 0.44–1.00)
GFR, Estimated: >60 mL/min (ref >60)
Glucose, Bld: 74 mg/dL (ref 70–99)
Potassium: 3.6 mmol/L (ref 3.5–5.1)
Sodium: 133 mmol/L — ABNORMAL LOW (ref 135–145)

## 2022-02-28 LAB — PHOSPHORUS: Phosphorus: 2.4 mg/dL — ABNORMAL LOW (ref 2.5–4.6)

## 2022-02-28 LAB — MAGNESIUM: Magnesium: 1.9 mg/dL (ref 1.7–2.4)

## 2022-02-28 MED ORDER — FOSFOMYCIN TROMETHAMINE 3 G PO PACK
3.0000 g | PACK | Freq: Once | ORAL | Status: DC
  Filled 2022-02-28: qty 3

## 2022-02-28 MED ORDER — SODIUM CHLORIDE 0.9 % IV SOLN: INTRAVENOUS | Status: DC

## 2022-02-28 MED ORDER — GENTAMICIN SULFATE 40 MG/ML IJ SOLN
5.0000 mg/kg | Freq: Once | INTRAVENOUS | Status: AC
  Administered 2022-02-28: 390 mg via INTRAVENOUS
  Filled 2022-02-28: qty 9.75

## 2022-02-28 NOTE — Plan of Care (Signed)

## 2022-02-28 NOTE — Hospital Course (Addendum)
Tiffany [name] is a 61 y.o. female with PMH HLD, COPD on 2L O2, asthma, CVA, GERD, depression, migraines, bipolar d/o, obesity, esophagitis, history of Nissen fundoplication who presented to the ER with nausea and vomiting.  She has had multiple ER visits for similar rather recently. ?She also underwent EGD with GI on 02/13/2022.  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  Some patchy gastric erythema noted otherwise unremarkable EGD. ?She endorses that Reglan has helped her at home in the past and she was started on this on admission. ?She was encouraged to continue on essentially a gastroparesis diet.  Her fianc? endorsed that she tolerates minced food as well. ?

## 2022-02-28 NOTE — Progress Notes (Addendum)
?Progress Note ? ? ?Patient: Tiffany [name] [id number] DOB: 03-26-1961 DOA: 02/24/2022     3 ?DOS: the patient was seen and examined on 02/28/2022 ?  ?Brief hospital course: ?Tiffany [name] is a 61 y.o. female with PMH HLD, COPD on 2L O2, asthma, CVA, GERD, depression, migraines, bipolar d/o, obesity, esophagitis, history of Nissen fundoplication who presented to the ER with nausea and vomiting.  She has had multiple ER visits for similar rather recently. ?She also underwent EGD with GI on 02/13/2022.  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  Some patchy gastric erythema noted otherwise unremarkable EGD. ?She endorses that Reglan has helped her at home in the past and she was started on this on admission. ? ?Assessment and Plan: ? ?Intractable nausea vomiting. ?Presumed gastroparesis ?History of Nissen fundoplication ?Hypokalemia. ?Hypomagnesemia. ?Erosive esophagitis. ?Hypophosphatemia ?- suspect due to non adherence to gastroparesis diet outpatient ?- resumed on scheduled reglan on 2/28, continue ?- d/c soft diet and start gastroparesis diet with FLD for now; will advance as able ?- unsafe for d/c today as not able to maintain adequate nutrition; change IVF to NS ?  ?E. coli urinary tract infection. ?Urine culture came back resistant to Rocephin.   ?-She has been unable to take fosfomycin.  Will give one-time dose of gentamicin ? ?Homelessness ?Poor social situation  ?Delayed mentation ?- Lives in a hotel with her fianc? ?- followed by DSS; requested for patient to have psych eval for capacity while hospitalized  ? ?Anemia of chronic disease ?Adequate iron and B12 level. ?  ?Bipolar disorder and depression. ?Continue Cymbalta. ? ?COPD. ?Chronic hypoxemic respiratory  failure. ?Stable. ? ?History of stroke. ?Continue Lipitor. ? ?Obesity with BMI 31.03. ?Continue follow-up ? ? ? ? ?Subjective: No events  overnight.  Still has been unable to tolerate any food this morning.  Does not notice a huge difference from Reglan since yesterday. ? ?Physical Exam: ?Vitals:  ? 02/27/22 2114 02/28/22 0330 02/28/22 0731 02/28/22 1125  ?BP: (!) 114/58 (!) 104/57 118/70 (!) 109/58  ?Pulse: 88 86 84 77  ?Resp: 20 16 18 16   ?Temp: 97.8 ?F (36.6 ?C) 98.4 ?F (36.9 ?C) 98.2 ?F (36.8 ?C) 97.8 ?F (36.6 ?C)  ?TempSrc:    Oral  ?SpO2: 95% 94% 94% 97%  ?Weight:      ?Height:      ? ?Physical Exam ?Constitutional:   ?   General: She is not in acute distress. ?   Appearance: Normal appearance.  ?HENT:  ?   Head: Normocephalic and atraumatic.  ?   Mouth/Throat:  ?   Mouth: Mucous membranes are moist.  ?Eyes:  ?   Extraocular Movements: Extraocular movements intact.  ?Cardiovascular:  ?   Rate and Rhythm: Normal rate and regular rhythm.  ?Pulmonary:  ?   Effort: Pulmonary effort is normal.  ?   Breath sounds: Normal breath sounds.  ?Abdominal:  ?   General: Bowel sounds are normal. There is no distension.  ?   Palpations: Abdomen is soft.  ?Musculoskeletal:     ?   General: Normal range of motion.  ?   Cervical back: Normal range of motion.  ?Skin: ?   General: Skin is warm and dry.  ?Neurological:  ?   Mental Status: She is alert.  ?   Comments: Slowed mentation; B/L weakness in LE  ? ? ? ?Data Reviewed: ? ?Results for  orders placed or performed during the hospital encounter of 02/24/22 (from the past 24 hour(s))  ?CBC with Differential/Platelet     Status: Abnormal  ? Collection Time: 02/28/22  3:04 AM  ?Result Value Ref Range  ? WBC 3.4 (L) 4.0 - 10.5 K/uL  ? RBC 2.80 (L) 3.87 - 5.11 MIL/uL  ? Hemoglobin 7.8 (L) 12.0 - 15.0 g/dL  ? HCT 23.5 (L) 36.0 - 46.0 %  ? MCV 83.9 80.0 - 100.0 fL  ? MCH 27.9 26.0 - 34.0 pg  ? MCHC 33.2 30.0 - 36.0 g/dL  ? RDW 15.1 11.5 - 15.5 %  ? Platelets 179 150 - 400 K/uL  ? nRBC 0.0 0.0 - 0.2 %  ? Neutrophils Relative % 37 %  ? Neutro Abs 1.2 (L) 1.7 - 7.7 K/uL  ? Lymphocytes Relative 44 %  ? Lymphs Abs 1.5 0.7 -  4.0 K/uL  ? Monocytes Relative 12 %  ? Monocytes Absolute 0.4 0.1 - 1.0 K/uL  ? Eosinophils Relative 5 %  ? Eosinophils Absolute 0.2 0.0 - 0.5 K/uL  ? Basophils Relative 1 %  ? Basophils Absolute 0.0 0.0 - 0.1 K/uL  ? Immature Granulocytes 1 %  ? Abs Immature Granulocytes 0.03 0.00 - 0.07 K/uL  ?Basic metabolic panel     Status: Abnormal  ? Collection Time: 02/28/22  3:04 AM  ?Result Value Ref Range  ? Sodium 133 (L) 135 - 145 mmol/L  ? Potassium 3.6 3.5 - 5.1 mmol/L  ? Chloride 101 98 - 111 mmol/L  ? CO2 28 22 - 32 mmol/L  ? Glucose, Bld 74 70 - 99 mg/dL  ? BUN <5 (L) 6 - 20 mg/dL  ? Creatinine, Ser 0.47 0.44 - 1.00 mg/dL  ? Calcium 7.3 (L) 8.9 - 10.3 mg/dL  ? GFR, Estimated >60 >60 mL/min  ? Anion gap 4 (L) 5 - 15  ?Magnesium     Status: None  ? Collection Time: 02/28/22  3:04 AM  ?Result Value Ref Range  ? Magnesium 1.9 1.7 - 2.4 mg/dL  ?Phosphorus     Status: Abnormal  ? Collection Time: 02/28/22  3:04 AM  ?Result Value Ref Range  ? Phosphorus 2.4 (L) 2.5 - 4.6 mg/dL  ?  ?I have Reviewed nursing notes, Vitals, and Lab results since pt's last encounter. Pertinent lab results : see above ?I have ordered test including BMP, CBC, Mg ?I have reviewed the last note from staff over past 24 hours ?I have discussed pt's care plan and test results with nursing staff, case manager ? ? ?Family Communication:  ? ?Disposition: ?Status is: Inpatient ?Remains inpatient appropriate because: Treatment as outlined in A&P ? ? ? Planned Discharge Destination: Home ? ?Antimicrobials: ? ? ?Consultants: ?Psychiatry ? ?Procedures:  ? ? ?DVT ppx:  ?enoxaparin (LOVENOX) injection 40 mg Start: 02/24/22 1000 ? ? ?  Code Status: Full Code  ? ? ?Author: ?Dwyane Dee, MD ?02/28/2022 2:11 PM ? ?For on call review [email or web address].  ? ?

## 2022-03-01 DIAGNOSIS — F319 Bipolar disorder, unspecified: Secondary | ICD-10-CM | POA: Diagnosis not present

## 2022-03-01 DIAGNOSIS — Z9889 Other specified postprocedural states: Secondary | ICD-10-CM

## 2022-03-01 DIAGNOSIS — R112 Nausea with vomiting, unspecified: Secondary | ICD-10-CM | POA: Diagnosis not present

## 2022-03-01 DIAGNOSIS — N3000 Acute cystitis without hematuria: Secondary | ICD-10-CM | POA: Diagnosis not present

## 2022-03-01 LAB — CBC WITH DIFFERENTIAL/PLATELET
Abs Immature Granulocytes: 0.04 10*3/uL (ref 0.00–0.07)
Basophils Absolute: 0.1 10*3/uL (ref 0.0–0.1)
Basophils Relative: 1 %
Eosinophils Absolute: 0.2 10*3/uL (ref 0.0–0.5)
Eosinophils Relative: 4 %
HCT: 25 % — ABNORMAL LOW (ref 36.0–46.0)
Hemoglobin: 8.4 g/dL — ABNORMAL LOW (ref 12.0–15.0)
Immature Granulocytes: 1 %
Lymphocytes Relative: 43 %
Lymphs Abs: 1.8 10*3/uL (ref 0.7–4.0)
MCH: 27.7 pg (ref 26.0–34.0)
MCHC: 33.6 g/dL (ref 30.0–36.0)
MCV: 82.5 fL (ref 80.0–100.0)
Monocytes Absolute: 0.5 10*3/uL (ref 0.1–1.0)
Monocytes Relative: 13 %
Neutro Abs: 1.6 10*3/uL — ABNORMAL LOW (ref 1.7–7.7)
Neutrophils Relative %: 38 %
Platelets: 195 10*3/uL (ref 150–400)
RBC: 3.03 MIL/uL — ABNORMAL LOW (ref 3.87–5.11)
RDW: 15.1 % (ref 11.5–15.5)
WBC: 4.2 10*3/uL (ref 4.0–10.5)
nRBC: 0 % (ref 0.0–0.2)

## 2022-03-01 LAB — BASIC METABOLIC PANEL
Anion gap: 9 (ref 5–15)
BUN: <5 mg/dL — ABNORMAL LOW (ref 6–20)
CO2: 26 mmol/L (ref 22–32)
Calcium: 7.9 mg/dL — ABNORMAL LOW (ref 8.9–10.3)
Chloride: 99 mmol/L (ref 98–111)
Creatinine, Ser: 0.43 mg/dL — ABNORMAL LOW (ref 0.44–1.00)
GFR, Estimated: >60 mL/min (ref >60)
Glucose, Bld: 72 mg/dL (ref 70–99)
Potassium: 3.4 mmol/L — ABNORMAL LOW (ref 3.5–5.1)
Sodium: 134 mmol/L — ABNORMAL LOW (ref 135–145)

## 2022-03-01 LAB — MAGNESIUM: Magnesium: 1.6 mg/dL — ABNORMAL LOW (ref 1.7–2.4)

## 2022-03-01 MED ORDER — MAGNESIUM SULFATE 2 GM/50ML IV SOLN
2.0000 g | Freq: Once | INTRAVENOUS | Status: AC
Start: 2022-03-01 — End: 2022-03-01
  Administered 2022-03-01: 2 g via INTRAVENOUS
  Filled 2022-03-01: qty 50

## 2022-03-01 MED ORDER — BOOST / RESOURCE BREEZE PO LIQD CUSTOM: 1.0000 | Freq: Three times a day (TID) | ORAL | Status: DC

## 2022-03-01 MED ORDER — POTASSIUM CHLORIDE CRYS ER 20 MEQ PO TBCR
40.0000 meq | EXTENDED_RELEASE_TABLET | Freq: Once | ORAL | Status: DC
Start: 2022-03-01 — End: 2022-03-01

## 2022-03-01 NOTE — Progress Notes (Signed)
Patient  and boyfriend was given discharge instructions, acknowledge understanding, and states they will comply. Transportation is being setup to take patient to their location. ?

## 2022-03-01 NOTE — Progress Notes (Signed)
EMS have transported patient to home. ?

## 2022-03-01 NOTE — Progress Notes (Addendum)
Initial Nutrition Assessment ? ?DOCUMENTATION CODES:  ? ?Obesity unspecified ? ?INTERVENTION:  ? ?-Boost Breeze po TID, each supplement provides 250 kcal and 9 grams of protein  ?-MVI with minerals daily ?-Advance diet to dysphagia 2 diet ?-Feeding assistance with meals ? ?NUTRITION DIAGNOSIS:  ? ?Inadequate oral intake related to poor appetite, nausea as evidenced by per patient/family report. ? ?GOAL:  ? ?Patient will meet greater than or equal to 90% of their needs ? ?MONITOR:  ? ?PO intake, Supplement acceptance, Diet advancement, Labs, Weight trends, Skin, I & O's ? ?REASON FOR ASSESSMENT:  ? ?Other (Comment) ?  ? ?ASSESSMENT:  ? ?Tiffany [name] is a 61 y.o. female with medical history significant of hyperlipidemia, COPD on 2 L oxygen, asthma, stroke, GERD, depression, migraine headache, bipolar, obesity with BMI 31.01, esophagitis, tongue swelling due to possible angioedema, who presents with nausea, vomiting. ? ?Pt admitted with UTI and intractable nausea and vomiting.  ? ?Reviewed I/O's: -956 ml x 24 hours and +5.1 L since admission ? ?UOP: 1.6 L x 24 hours ? ?Case discussed with Dr. Frederick Peers; pt is currently on a bariatric full liquid diet (diet is limited and designed for pt who just underwent bariatric surgery). Pt continues to have poor oral intake and nausea, which has delayed her discharge.  ? ?Spoke with pt and fiance (Eddie) at bedside. Pt not very interactive with this RD and deferred most questioning to Eddie (pt currently watching food videos on her phone). Link Snuffer shares that pt has experienced a general decline in health since November after suffering a stroke. She was placed in a SNF for approximately 6 weeks, but has been living in a motel since late December 2022. Pt unable to take much solid food or medications down, as they "come right back up". Diet consists mainly of chopped spaghetti and chicken broth. Pt shares that she has been unable to eat secondary to nausea. She consumed 1/2 cup of  chicken broth today. Pt is frustrated that she cannot get certain food items, such as [place] ice. She is amenable to try solid foods, but requires them to be cut up and have feeding assistance. Pt also avoids eggs and milk stating "they mess with my asthma".  ? ?Pt UBW is around 400#. Eddie reports pt has lost about 100# over the past few months. Pt has not been mobile since November due to stroke. Reviewed wt hx; pt has experienced a 27% wt loss over the past 3 months, which is significant for time frame.  ? ?Discussed importance of good meal and supplement intake to promote healing. Pt ready to try solid foods; encouraged pt to order colder foods with less odors to help combat nausea. Discussed with MD; diet advanced.  ? ?Medications reviewed and include calcium with vitamin D and folic acid.  ? ?Labs reviewed: Na: 134, K: 3.4. Mg: 1.6.   ? ?NUTRITION - FOCUSED PHYSICAL EXAM: ? ?Flowsheet Row Most Recent Value  ?Orbital Region No depletion  ?Upper Arm Region Mild depletion  ?Thoracic and Lumbar Region No depletion  ?Buccal Region No depletion  ?Temple Region No depletion  ?Clavicle Bone Region No depletion  ?Clavicle and Acromion Bone Region No depletion  ?Scapular Bone Region No depletion  ?Dorsal Hand No depletion  ?Patellar Region Mild depletion  ?Anterior Thigh Region Mild depletion  ?Posterior Calf Region Mild depletion  ?Edema (RD Assessment) Mild  ?Hair Reviewed  ?Eyes Reviewed  ?Mouth Reviewed  ?Skin Reviewed  ?Nails Reviewed  ? ?  ? ? ?  Diet Order:   ?Diet Order   ? ?       ?  DIET DYS 2 Room service appropriate? Yes; Fluid consistency: Thin  Diet effective now       ?  ? ?  ?  ? ?  ? ? ?EDUCATION NEEDS:  ? ?Education needs have been addressed ? ?Skin:  Skin Assessment: Skin Integrity Issues: ?Skin Integrity Issues:: Stage II ?Stage II: buttocks ? ?Last BM:  02/26/22 ? ?Height:  ? ?Ht Readings from Last 1 Encounters:  ?02/23/22 5\' 9"  (1.753 m)  ? ? ?Weight:  ? ?Wt Readings from Last 1 Encounters:   ?02/23/22 95.3 kg  ? ? ?Ideal Body Weight:  65.9 kg ? ?BMI:  Body mass index is 31.03 kg/m?. ? ?Estimated Nutritional Needs:  ? ?Kcal:  1950-2050 ? ?Protein:  115-130 grams ? ?Fluid:  > 1.9 L ? ? ? ?02/25/22, RD, LDN, CDCES ?Registered Dietitian II ?Certified Diabetes Care and Education Specialist ?Please refer to [facility] for RD and/or RD on-call/weekend/after hours pager  ?

## 2022-03-01 NOTE — TOC Progression Note (Signed)
Transition of Care (TOC) - Progression Note  ? ? ?Patient Details  ?Name: Tiffany [name] ?MRN: [id number] ?Date of Birth: [birth year] ? ?Transition of Care (TOC) CM/SW Contact  ?Mikaelyn Arthurs A Rutilio Yellowhair, LCSW ?Phone Number: ?03/01/2022, 3:18 PM ? ?Clinical Narrative:   Transport arranged for return to [facility]. RN aware. ? ? ? ?  ?  ? ?Expected Discharge Plan and Services ?  ?  ?  ?  ?  ?Expected Discharge Date: 03/01/22               ?  ?  ?  ?  ?  ?  ?  ?  ?  ?  ? ? ?Social Determinants of Health (SDOH) Interventions ?  ? ?Readmission Risk Interventions ?Readmission Risk Prevention Plan 11/08/2021  ?Transportation Screening Complete  ?PCP or Specialist Appt within 5-7 Days Complete  ?Home Care Screening Complete  ?Medication Review (RN CM) Complete  ?Some recent data might be hidden  ? ? ?

## 2022-03-01 NOTE — Discharge Summary (Signed)
Physician Discharge Summary   Patient: Tiffany [name] MRN: [id number] DOB: August 08, 1961  Admit date:     02/24/2022  Discharge date: 03/01/22  Discharge Physician: Dwyane Dee   PCP: Nelle Don, MD   Recommendations at discharge:    Continue routine medical care  Discharge Diagnoses: Principal Problem:   Bipolar 1 disorder ([place]) Active Problems:   Hypokalemia   Intractable nausea and vomiting   Angioedema   HLD (hyperlipidemia)   COPD (chronic obstructive pulmonary disease) ([place])   Stroke ([place])   Depression   Homeless   UTI (urinary tract infection)  Resolved Problems:   * No resolved hospital problems. *   Hospital Course: Tiffany [name] is a 61 y.o. female with PMH HLD, COPD on 2L O2, asthma, CVA, GERD, depression, migraines, bipolar d/o, obesity, esophagitis, history of Nissen fundoplication who presented to the ER with nausea and vomiting.  She has had multiple ER visits for similar rather recently. She also underwent EGD with GI on 02/13/2022.  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  Some patchy gastric erythema noted otherwise unremarkable EGD. She endorses that Reglan has helped her at home in the past and she was started on this on admission. She was encouraged to continue on essentially a gastroparesis diet.  Her fianc endorsed that she tolerates minced food as well.  Assessment and Plan:  Intractable nausea vomiting. Presumed gastroparesis History of Nissen fundoplication Hypokalemia. Hypomagnesemia. Erosive esophagitis. Hypophosphatemia - suspect due to non adherence to gastroparesis diet outpatient - fiance states she tolerates minced food outpatient well; this was restarted prior to discharge - N/V improved and she was able to tolerate some liquids and keep them down   E. coli urinary tract infection. Urine culture came back resistant  to Rocephin.   -She has been unable to take fosfomycin.  Will give one-time dose of gentamicin on 3/1  Homelessness Poor social situation  Delayed mentation - Lives in a hotel with her fianc - followed by DSS; requested for patient to have psych eval for capacity while hospitalized  - seen by psych, greatly appreciated. Patient does have capacity and feels safe in her current environment in the care of her fiance as well   Anemia of chronic disease Adequate iron and B12 level.   Bipolar disorder and depression. Continue Cymbalta.  COPD. Chronic hypoxemic respiratory  failure. Stable.  History of stroke-like symptoms -Last CT head performed on 11/13/2021 still negative for any evidence of prior strokes Continue Lipitor.  Obesity with BMI 31.03. Continue follow-up        Consultants: Psych Procedures performed:   Disposition: Home  DISCHARGE MEDICATION: Allergies as of 03/01/2022       Reactions   Dairy Aid [tilactase] Other (See Comments)   Exacerbates asthma   Egg [eggs Or Egg-derived Products] Other (See Comments)   Exacerbated asthma   Milk-related Compounds         Medication List     STOP taking these medications    potassium chloride 20 MEQ packet Commonly known as: KLOR-CON   predniSONE 20 MG tablet Commonly known as: DELTASONE   Vitamin D (Ergocalciferol) 1.25 MG (50000 UNIT) Caps capsule Commonly known as: DRISDOL       TAKE these medications    acetaminophen 325 MG tablet Commonly known as: TYLENOL Take 2 tablets (650 mg total) by mouth every 6 (six) hours as needed for  mild pain (or Fever >/= 101).   atorvastatin 80 MG tablet Commonly known as: LIPITOR Take 80 mg by mouth daily.   calcium-vitamin D 500-5 MG-MCG tablet Commonly known as: OSCAL WITH D Take 1 tablet by mouth daily with breakfast.   diclofenac Sodium 1 % Gel Commonly known as: VOLTAREN Apply 4 g topically 4 (four) times daily.   diphenhydrAMINE 12.5 MG/5ML  elixir Commonly known as: BENADRYL Take 10 mLs (25 mg total) by mouth 4 (four) times daily as needed.   DULoxetine 30 MG capsule Commonly known as: CYMBALTA Take 30 mg by mouth daily.   famotidine 20 MG tablet Commonly known as: PEPCID Take 1 tablet (20 mg total) by mouth 2 (two) times daily.   feeding supplement (KATE FARMS STANDARD 1.4) Liqd liquid Take 325 mLs by mouth 3 (three) times daily between meals.   Flovent HFA 110 MCG/ACT inhaler Generic drug: fluticasone Inhale 2 puffs into the lungs 2 (two) times daily.   folic acid 1 MG tablet Commonly known as: FOLVITE Take 5 tablets (5 mg total) by mouth daily.   loratadine 10 MG tablet Commonly known as: CLARITIN Take 1 tablet (10 mg total) by mouth every evening.   multivitamin with minerals Tabs tablet Take 1 tablet by mouth daily.   ondansetron 4 MG disintegrating tablet Commonly known as: ZOFRAN-ODT Take 1 tablet (4 mg total) by mouth every 8 (eight) hours as needed for nausea or vomiting.   pantoprazole 40 MG tablet Commonly known as: PROTONIX Take 40 mg by mouth 2 (two) times daily.   polyethylene glycol 17 g packet Commonly known as: MIRALAX / GLYCOLAX Take 17 g by mouth 2 (two) times daily as needed.   Spiriva HandiHaler 18 MCG inhalation capsule Generic drug: tiotropium 1 capsule at bedtime.   traZODone 100 MG tablet Commonly known as: DESYREL Take 1 tablet (100 mg total) by mouth at bedtime as needed for sleep.         Discharge Exam: Filed Weights   02/23/22 2245  Weight: 95.3 kg   Physical Exam Constitutional:      General: She is not in acute distress.    Appearance: Normal appearance.  HENT:     Head: Normocephalic and atraumatic.     Mouth/Throat:     Mouth: Mucous membranes are moist.  Eyes:     Extraocular Movements: Extraocular movements intact.  Cardiovascular:     Rate and Rhythm: Normal rate and regular rhythm.  Pulmonary:     Effort: Pulmonary effort is normal.      Breath sounds: Normal breath sounds.  Abdominal:     General: Bowel sounds are normal. There is no distension.     Palpations: Abdomen is soft.  Musculoskeletal:        General: Normal range of motion.     Cervical back: Normal range of motion.  Skin:    General: Skin is warm and dry.  Neurological:     Mental Status: She is alert.     Comments: Slowed mentation; B/L weakness in LE    Condition at discharge: stable  The results of significant diagnostics from this hospitalization (including imaging, microbiology, ancillary and laboratory) are listed below for reference.   Imaging Studies: CT Soft Tissue Neck W Contrast  Result Date: 02/21/2022 CLINICAL DATA:  Provided history: Laryngeal edema. Personal history provided: Allergic reaction. Tongue pain. EXAM: CT NECK WITH CONTRAST TECHNIQUE: Multidetector CT imaging of the neck was performed using the standard protocol following the bolus administration  of intravenous contrast. RADIATION DOSE REDUCTION: This exam was performed according to the departmental dose-optimization program which includes automated exposure control, adjustment of the mA and/or kV according to patient size and/or use of iterative reconstruction technique. CONTRAST:  30mL OMNIPAQUE IOHEXOL 300 MG/ML  SOLN COMPARISON:  CT cervical spine 10/29/2021. FINDINGS: Pharynx and larynx: Poor dentition with multiple absent teeth. No swelling or discrete mass is appreciable within the oral cavity, pharynx or larynx by CT. Unremarkable appearance of the epiglottis. The imaged upper airway is patent. Postinflammatory calcifications/tonsilliths within the bilateral palatine tonsils. Salivary glands: No inflammation, mass, or stone. Thyroid: Unremarkable. Lymph nodes: No pathologically enlarged cervical chain lymph nodes. Vascular: The major vascular structures of the neck are patent. Partially retropharyngeal course of the left ICA. Limited intracranial: The intracranial contents are only  minimally included in the field of view. No evidence of acute intracranial abnormality at the imaged levels. Visualized orbits: No orbital mass or acute orbital finding. Mastoids and visualized paranasal sinuses: Portions of the frontal and ethmoid sinuses are excluded from the field of view. No significant paranasal sinus disease or mastoid effusion at the imaged levels. Skeleton: No acute bony abnormality or aggressive osseous lesion. Straightening of the expected cervical lordosis. Cervical spondylosis. Upper chest: No consolidation within the imaged lung apices. IMPRESSION: No swelling or discrete mass is appreciable within the oral cavity, pharynx or larynx by CT. The imaged upper airway is patent. Unremarkable appearance of the epiglottis. Postinflammatory calcifications/tonsilliths within the bilateral palatine tonsils. Cervical spondylosis. Electronically Signed   By: Kellie Simmering D.O.   On: 02/21/2022 14:45   CT Abdomen Pelvis W Contrast  Result Date: 02/07/2022 CLINICAL DATA:  Nausea and vomiting for 2 days. EXAM: CT ABDOMEN AND PELVIS WITH CONTRAST TECHNIQUE: Multidetector CT imaging of the abdomen and pelvis was performed using the standard protocol following bolus administration of intravenous contrast. RADIATION DOSE REDUCTION: This exam was performed according to the departmental dose-optimization program which includes automated exposure control, adjustment of the mA and/or kV according to patient size and/or use of iterative reconstruction technique. CONTRAST:  135mL OMNIPAQUE IOHEXOL 300 MG/ML  SOLN COMPARISON:  Abdominopelvic CT 08/12/2021. FINDINGS: Lower chest: Clear lung bases. No significant pleural or pericardial effusion. Moderate size hiatal hernia appears similar to the previous study, with possible associated wall thickening. Hepatobiliary: The liver is normal in density without suspicious focal abnormality. Probable dependent sludge in the gallbladder lumen. No evidence of gallstones,  gallbladder wall thickening or surrounding inflammation. No evidence of biliary ductal dilatation. Pancreas: Fatty replaced. No evidence of ductal dilatation or surrounding inflammation. Spleen: Normal in size without focal abnormality. Adrenals/Urinary Tract: Both adrenal glands appear normal. The kidneys appear normal without evidence of urinary tract calculus, suspicious lesion or hydronephrosis. No bladder abnormalities are seen. Stomach/Bowel: No enteric contrast administered. As above, moderate-size hiatal hernia. The stomach otherwise appears unremarkable for its degree of distension. No evidence of bowel wall thickening, distention or surrounding inflammatory change. Prior study suggested previous appendectomy. Currently, there is a mildly prominent tubular structure projecting inferiorly from the cecal tip which appears blind-ending and likely reflects the appendix. This measures up to 8 mm in diameter, but demonstrates no surrounding inflammation. Vascular/Lymphatic: There are no enlarged abdominal or pelvic lymph nodes. No acute vascular findings. Scattered retroperitoneal phleboliths. Reproductive: Hysterectomy. Stable appearance of the left ovary. No suspicious adnexal findings. Other: No evidence of abdominal wall mass or hernia. No ascites. Musculoskeletal: No acute or significant osseous findings. Generalized muscular atrophy. IMPRESSION: 1. No definite  acute findings. Apparent prominence of the appendix without surrounding inflammatory changes to suggest acute appendicitis in this patient without leukocytosis. Recommend clinical follow up. 2. Moderate size hiatal hernia.  No evidence of bowel obstruction. 3. Previous hysterectomy. Electronically Signed   By: Richardean Sale M.D.   On: 02/07/2022 16:20    Microbiology: Results for orders placed or performed during the hospital encounter of 02/24/22  Urine Culture     Status: Abnormal   Collection Time: 02/24/22  4:50 AM   Specimen: Urine,  Random  Result Value Ref Range Status   Specimen Description   Final    URINE, RANDOM Performed at [facility], [address] [place], [place] [address]    Special Requests   Final    NONE Performed at [facility], [address] [place], [place] [address]    Culture (A)  Final    >=100,000 COLONIES/mL ESCHERICHIA COLI Confirmed Extended Spectrum Beta-Lactamase Producer (ESBL).  In bloodstream infections from ESBL organisms, carbapenems are preferred over piperacillin/tazobactam. They are shown to have a lower risk of mortality.    Report Status 02/27/2022 FINAL  Final   Organism ID, Bacteria ESCHERICHIA COLI (A)  Final      Susceptibility   Escherichia coli - MIC*    AMPICILLIN >=32 RESISTANT Resistant     CEFAZOLIN >=64 RESISTANT Resistant     CEFEPIME 16 RESISTANT Resistant     CEFTRIAXONE >=64 RESISTANT Resistant     CIPROFLOXACIN >=4 RESISTANT Resistant     GENTAMICIN 4 SENSITIVE Sensitive     IMIPENEM <=0.25 SENSITIVE Sensitive     NITROFURANTOIN <=16 SENSITIVE Sensitive     TRIMETH/SULFA >=320 RESISTANT Resistant     AMPICILLIN/SULBACTAM 8 SENSITIVE Sensitive     PIP/TAZO <=4 SENSITIVE Sensitive     * >=100,000 COLONIES/mL ESCHERICHIA COLI  Resp Panel by RT-PCR (Flu A&B, Covid) Nasopharyngeal Swab     Status: None   Collection Time: 02/24/22  8:11 AM   Specimen: Nasopharyngeal Swab; Nasopharyngeal(NP) swabs in vial transport medium  Result Value Ref Range Status   SARS Coronavirus 2 by RT PCR NEGATIVE NEGATIVE Final    Comment: (NOTE) SARS-CoV-2 target nucleic acids are NOT DETECTED.  The SARS-CoV-2 RNA is generally detectable in upper respiratory specimens during the acute phase of infection. The lowest concentration of SARS-CoV-2 viral copies this assay can detect is 138 copies/mL. A negative result does not preclude SARS-Cov-2 infection and should not be used as the sole basis for treatment or other patient management decisions. A negative  result may occur with  improper specimen collection/handling, submission of specimen other than nasopharyngeal swab, presence of viral mutation(s) within the areas targeted by this assay, and inadequate number of viral copies(<138 copies/mL). A negative result must be combined with clinical observations, patient history, and epidemiological information. The expected result is Negative.  Fact Sheet for Patients:  EntrepreneurPulse.com.au  Fact Sheet for Healthcare Providers:  IncredibleEmployment.be  This test is no t yet approved or cleared by the [place] FDA and  has been authorized for detection and/or diagnosis of SARS-CoV-2 by FDA under an Emergency Use Authorization (EUA). This EUA will remain  in effect (meaning this test can be used) for the duration of the COVID-19 declaration under Section 564(b)(1) of the Act, 21 U.S.C.section 360bbb-3(b)(1), unless the authorization is terminated  or revoked sooner.       Influenza A by PCR NEGATIVE NEGATIVE Final   Influenza B by PCR NEGATIVE NEGATIVE Final  Comment: (NOTE) The Xpert Xpress SARS-CoV-2/FLU/RSV plus assay is intended as an aid in the diagnosis of influenza from Nasopharyngeal swab specimens and should not be used as a sole basis for treatment. Nasal washings and aspirates are unacceptable for Xpert Xpress SARS-CoV-2/FLU/RSV testing.  Fact Sheet for Patients: EntrepreneurPulse.com.au  Fact Sheet for Healthcare Providers: IncredibleEmployment.be  This test is not yet approved or cleared by the [place] FDA and has been authorized for detection and/or diagnosis of SARS-CoV-2 by FDA under an Emergency Use Authorization (EUA). This EUA will remain in effect (meaning this test can be used) for the duration of the COVID-19 declaration under Section 564(b)(1) of the Act, 21 U.S.C. section 360bbb-3(b)(1), unless the authorization is  terminated or revoked.  Performed at [facility], [address] [place], [place] [address]     Labs: CBC: Recent Labs  Lab 02/23/22 2255 02/26/22 0456 02/28/22 0304 03/01/22 0434  WBC 6.6 3.4* 3.4* 4.2  NEUTROABS  --  1.7 1.2* 1.6*  HGB 9.6* 8.2* 7.8* 8.4*  HCT 29.6* 25.0* 23.5* 25.0*  MCV 85.1 84.2 83.9 82.5  PLT 200 174 179 0000000   Basic Metabolic Panel: Recent Labs  Lab 02/25/22 0420 02/26/22 0456 02/27/22 0541 02/28/22 0304 03/01/22 0434  NA 136 137 136 133* 134*  K 2.7* 3.0* 3.9 3.6 3.4*  CL 99 98 100 101 99  CO2 29 29 26 28 26   GLUCOSE 77 69* 74 74 72  BUN 9 6 <5* <5* <5*  CREATININE 0.44 0.49 0.34* 0.47 0.43*  CALCIUM 7.6* 7.6* 7.9* 7.3* 7.9*  MG 1.4* 1.8 1.6* 1.9 1.6*  PHOS  --  1.9* 2.9 2.4*  --    Liver Function Tests: Recent Labs  Lab 02/23/22 2255 02/25/22 0420  AST 18  --   ALT 14  --   ALKPHOS 53  --   BILITOT 1.0  --   PROT 6.0*  --   ALBUMIN 2.6* 2.0*   CBG: No results for input(s): GLUCAP in the last 168 hours.  Discharge time spent: greater than 30 minutes.  Signed: Dwyane Dee, MD [facility] 03/01/2022

## 2022-03-01 NOTE — Consult Note (Cosign Needed Addendum)
[facility]   Reason for Consult:  "Capacity" Referring Physician:  Girguis Patient Identification: Tiffany [name] MRN:  [id number] Principal Diagnosis: Bipolar 1 disorder (HCC) Diagnosis:  Principal Problem:   Bipolar 1 disorder (HCC) Active Problems:   Hypokalemia   Intractable nausea and vomiting   Angioedema   HLD (hyperlipidemia)   COPD (chronic obstructive pulmonary disease) (HCC)   Stroke (HCC)   Depression   Homeless   UTI (urinary tract infection)   Total Time spent with patient: 45 minutes  Subjective:   Tiffany [name] is a 61 y.o. female patient admitted with UTI.  HPI:  Consult for this patient for "capacity", apparently at request of DSS. Patient has had recent (February 2023) multiple presentations to ED for intractable vomiting, tongue swelling, with some ED presentations resulting in admissions to the hospital.  She was last admitted as inpatient on 02/24/2022.  Patient has multiple medical co-morbidities, including Bi-polar I disorder by report.   On evaluation today, patient's significant other Margo Common(Eddie Allen) at bedside, and is agreeable to leave during interview.  Patient is made aware of reason for consultation.  Patient is alert and oriented to self, year, place, situation. She speaks in linear, coherent sentences. Does not appear to be responding to internal stimuli. She is able to identify that she could not live alone and needs assistance with ADL's.  She states that she [facility] her  [facility] and "he takes care of me." Patient states that she ans Mr. Freida Busmanllen have a good relationship; she has known him for many years from growing up here in [place] and they re-connected when she was still living in [place]. She reports  there is no abuse from him in any way.   Patient reports that she does have bipolar I but her mood has remained stable for years. She denies any thoughts of self-harm or suicide. Denies homicidal ideations,  auditory or visual hallucinations or paranoia. Denies depression. She reports being raised in [place], but moved to [place] in 1990, returning in 2021 when her mother became ill. Mother subsequently passed away. She reports receiving PT twice a week and states her walking ability is improving. Writer asked if she had a stroke (it is in her problem list but chart review reveals no specific date of stroke or treatment). Patient said it was "stroke-like symptoms" in November. She says she is having more trouble using her hands and is "bed-bound." Patient states that she gets disability income. 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 As far as capacity to make her decisions, patient recognizes that she would not be able to live alone at this point, as she is unable to perform ADLs adequately in a safe manner currently. She relies on her fiance. She does appear to have the capacity to make her own medical decisions, as evidenced by her presentations to hospital when she becomes ill. However, she may benefit from more skilled assistance at home to gain more strength and functional ability. She could use Patient also told Clinical research associatewriter "My brother would take care of me if I needed him to."   Fiance, Margo Commonddie Allen, confirms that he "does everything" and is comfortable with that responsibility.   Collateral form patient's brother, Josefine ClassClifford Whitfield, Patient's [place] [phone]):  Mr. Cleophas DunkerWhitfield expressed that he has no concerns about patient's capacity to make decisions. He confirmed that he is he POA and he is currently applying for housing for patient. He is  concerned about why she can't walk and doesn't think she had a stroke. Writer advised him to address with her primary care team. He did not express safety concerns in her relationship with Mr. Freida Busman.      Past Psychiatric History: bipolar by report  Risk to Self:   Risk to Others:   Prior Inpatient Therapy:   Prior Outpatient Therapy:    Past Medical History:   Past Medical History:  Diagnosis Date   Asthma    Bipolar 1 disorder (HCC)    COPD (chronic obstructive pulmonary disease) (HCC)    CVA (cerebral vascular accident) (HCC)    Enlarged heart    GERD (gastroesophageal reflux disease)    Migraines    Sleep apnea     Past Surgical History:  Procedure Laterality Date   ABDOMINAL HYSTERECTOMY     ABDOMINAL SURGERY     "For acid reflux"   CESAREAN SECTION     ESOPHAGOGASTRODUODENOSCOPY N/A 02/13/2022   Procedure: ESOPHAGOGASTRODUODENOSCOPY (EGD);  Surgeon: Toledo, Boykin Nearing, MD;  Location: [facility] ENDOSCOPY;  Service: Gastroenterology;  Laterality: N/A;   OTHER SURGICAL HISTORY  01/09/1998   acid reflux, hole in stomach they closed   Family History:  Family History  Problem Relation Age of Onset   Hypertension Mother    Family Psychiatric  History: not reported Social History:  Social History   Substance and Sexual Activity  Alcohol Use Never     Social History   Substance and Sexual Activity  Drug Use Never    Social History   Socioeconomic History   Marital status: Single    Spouse name: Not on file   Number of children: Not on file   Years of education: Not on file   Highest education level: Not on file  Occupational History   Not on file  Tobacco Use   Smoking status: Never   Smokeless tobacco: Never  Vaping Use   Vaping Use: Never used  Substance and Sexual Activity   Alcohol use: Never   Drug use: Never   Sexual activity: Not on file  Other Topics Concern   Not on file  Social History Narrative   Not on file   Social Determinants of Health   Financial Resource Strain: Not on file  Food Insecurity: Not on file  Transportation Needs: Not on file  Physical Activity: Not on file  Stress: Not on file  Social Connections: Not on file   Additional Social History:    Allergies:   Allergies  Allergen Reactions   Dairy Aid [Tilactase] Other (See Comments)    Exacerbates asthma   Egg [Eggs Or  Egg-Derived Products] Other (See Comments)    Exacerbated asthma   Milk-Related Compounds     Labs:  Results for orders placed or performed during the hospital encounter of 02/24/22 (from the past 48 hour(s))  CBC with Differential/Platelet     Status: Abnormal   Collection Time: 02/28/22  3:04 AM  Result Value Ref Range   WBC 3.4 (L) 4.0 - 10.5 K/uL   RBC 2.80 (L) 3.87 - 5.11 MIL/uL   Hemoglobin 7.8 (L) 12.0 - 15.0 g/dL   HCT 61.5 (L) 37.9 - 43.2 %   MCV 83.9 80.0 - 100.0 fL   MCH 27.9 26.0 - 34.0 pg   MCHC 33.2 30.0 - 36.0 g/dL   RDW 76.1 47.0 - 92.9 %   Platelets 179 150 - 400 K/uL   nRBC 0.0 0.0 - 0.2 %   Neutrophils  Relative % 37 %   Neutro Abs 1.2 (L) 1.7 - 7.7 K/uL   Lymphocytes Relative 44 %   Lymphs Abs 1.5 0.7 - 4.0 K/uL   Monocytes Relative 12 %   Monocytes Absolute 0.4 0.1 - 1.0 K/uL   Eosinophils Relative 5 %   Eosinophils Absolute 0.2 0.0 - 0.5 K/uL   Basophils Relative 1 %   Basophils Absolute 0.0 0.0 - 0.1 K/uL   Immature Granulocytes 1 %   Abs Immature Granulocytes 0.03 0.00 - 0.07 K/uL    Comment: Performed at [facility], [address] [place], [place] [address]  Basic metabolic panel     Status: Abnormal   Collection Time: 02/28/22  3:04 AM  Result Value Ref Range   Sodium 133 (L) 135 - 145 mmol/L   Potassium 3.6 3.5 - 5.1 mmol/L   Chloride 101 98 - 111 mmol/L   CO2 28 22 - 32 mmol/L   Glucose, Bld 74 70 - 99 mg/dL    Comment: Glucose reference range applies only to samples taken after fasting for at least 8 hours.   BUN <5 (L) 6 - 20 mg/dL   Creatinine, Ser 7.82 0.44 - 1.00 mg/dL   Calcium 7.3 (L) 8.9 - 10.3 mg/dL   GFR, Estimated >95 >62 mL/min    Comment: (NOTE) Calculated using the CKD-EPI Creatinine Equation (2021)    Anion gap 4 (L) 5 - 15    Comment: Performed at [facility], [address] [place], [place] [address]  Magnesium     Status: None   Collection Time: 02/28/22  3:04 AM  Result Value Ref Range    Magnesium 1.9 1.7 - 2.4 mg/dL    Comment: Performed at [facility], [address] [place], [place] [address]  Phosphorus     Status: Abnormal   Collection Time: 02/28/22  3:04 AM  Result Value Ref Range   Phosphorus 2.4 (L) 2.5 - 4.6 mg/dL    Comment: Performed at [facility], [address] [place], [place] [address]  Basic metabolic panel     Status: Abnormal   Collection Time: 03/01/22  4:34 AM  Result Value Ref Range   Sodium 134 (L) 135 - 145 mmol/L   Potassium 3.4 (L) 3.5 - 5.1 mmol/L   Chloride 99 98 - 111 mmol/L   CO2 26 22 - 32 mmol/L   Glucose, Bld 72 70 - 99 mg/dL    Comment: Glucose reference range applies only to samples taken after fasting for at least 8 hours.   BUN <5 (L) 6 - 20 mg/dL   Creatinine, Ser 2.84 (L) 0.44 - 1.00 mg/dL   Calcium 7.9 (L) 8.9 - 10.3 mg/dL   GFR, Estimated >13 >24 mL/min    Comment: (NOTE) Calculated using the CKD-EPI Creatinine Equation (2021)    Anion gap 9 5 - 15    Comment: Performed at [facility], [address] [place], [place] [address]  CBC with Differential/Platelet     Status: Abnormal   Collection Time: 03/01/22  4:34 AM  Result Value Ref Range   WBC 4.2 4.0 - 10.5 K/uL   RBC 3.03 (L) 3.87 - 5.11 MIL/uL   Hemoglobin 8.4 (L) 12.0 - 15.0 g/dL   HCT 72.5 (L) 36.6 - 44.0 %   MCV 82.5 80.0 - 100.0 fL   MCH 27.7 26.0 - 34.0 pg   MCHC 33.6 30.0 - 36.0 g/dL   RDW 34.7 42.5 - 95.6 %  Platelets 195 150 - 400 K/uL   nRBC 0.0 0.0 - 0.2 %   Neutrophils Relative % 38 %   Neutro Abs 1.6 (L) 1.7 - 7.7 K/uL   Lymphocytes Relative 43 %   Lymphs Abs 1.8 0.7 - 4.0 K/uL   Monocytes Relative 13 %   Monocytes Absolute 0.5 0.1 - 1.0 K/uL   Eosinophils Relative 4 %   Eosinophils Absolute 0.2 0.0 - 0.5 K/uL   Basophils Relative 1 %   Basophils Absolute 0.1 0.0 - 0.1 K/uL   Immature Granulocytes 1 %   Abs Immature Granulocytes 0.04 0.00 - 0.07 K/uL    Comment: Performed at [facility], [address], [place], [place] [address]  Magnesium     Status: Abnormal   Collection Time: 03/01/22  4:34 AM  Result Value Ref Range   Magnesium 1.6 (L) 1.7 - 2.4 mg/dL    Comment: Performed at [facility], [address] [place], [place] [address]    Current Facility-Administered Medications  Medication Dose Route Frequency Provider Last Rate Last Admin   acetaminophen (TYLENOL) tablet 650 mg  650 mg Oral Q6H PRN Howerter, Justin B, DO       Or   acetaminophen (TYLENOL) suppository 650 mg  650 mg Rectal Q6H PRN Howerter, Justin B, DO       albuterol (PROVENTIL) (2.5 MG/3ML) 0.083% nebulizer solution 3 mL  3 mL Inhalation Q4H PRN Lorretta Harp, MD       atorvastatin (LIPITOR) tablet 80 mg  80 mg Oral Daily Lorretta Harp, MD   80 mg at 02/24/22 1022   budesonide (PULMICORT) nebulizer solution 0.25 mg  0.25 mg Nebulization BID Rauer, Robyne Peers, RPH   0.25 mg at 03/01/22 0802   calcium-vitamin D (OSCAL WITH D) 500-5 MG-MCG per tablet 1 tablet  1 tablet Oral Q breakfast Lorretta Harp, MD       dextromethorphan-guaiFENesin (MUCINEX DM) 30-600 MG per 12 hr tablet 1 tablet  1 tablet Oral BID PRN Lorretta Harp, MD       DULoxetine (CYMBALTA) DR capsule 30 mg  30 mg Oral Daily Lorretta Harp, MD   30 mg at 02/24/22 1023   enoxaparin (LOVENOX) injection 40 mg  40 mg Subcutaneous Q24H Lorretta Harp, MD   40 mg at 03/01/22 0831   famotidine (PEPCID) tablet 20 mg  20 mg Oral BID Lorretta Harp, MD   20 mg at 03/01/22 7793   feeding supplement (BOOST / RESOURCE BREEZE) liquid 1 Container  1 Container Oral TID BM Lewie Chamber, MD       folic acid (FOLVITE) tablet 5 mg  5 mg Oral Daily Lorretta Harp, MD   5 mg at 02/24/22 1021   loratadine (CLARITIN) tablet 10 mg  10 mg Oral QPM Lorretta Harp, MD       LORazepam (ATIVAN) injection 0.5 mg  0.5 mg Intravenous Q6H PRN Howerter, Justin B, DO   0.5 mg at 02/27/22 2103   metoCLOPramide (REGLAN) injection 5 mg  5 mg Intravenous Q6H Marrion Coy, MD   5 mg at 03/01/22 1241    multivitamin with minerals tablet 1 tablet  1 tablet Oral Daily Lorretta Harp, MD   1 tablet at 02/24/22 1022   ondansetron (ZOFRAN) injection 4 mg  4 mg Intravenous Q6H PRN Howerter, Justin B, DO   4 mg at 03/01/22 0419   pantoprazole (PROTONIX) injection 40 mg  40 mg Intravenous Q12H Marrion Coy, MD   40 mg at  03/01/22 0823   polyethylene glycol (MIRALAX / GLYCOLAX) packet 17 g  17 g Oral BID PRN Lorretta HarpNiu, Xilin, MD       potassium chloride SA (KLOR-CON M) CR tablet 40 mEq  40 mEq Oral Once Lewie ChamberGirguis, David, MD       prochlorperazine (COMPAZINE) injection 10 mg  10 mg Intravenous Q4H PRN Marrion CoyZhang, Dekui, MD   10 mg at 03/01/22 0105   promethazine (PHENERGAN) tablet 25 mg  25 mg Oral Once Ward, Kristen N, DO       senna-docusate (Senokot-S) tablet 1 tablet  1 tablet Oral BID Marrion CoyZhang, Dekui, MD       sucralfate (CARAFATE) 1 GM/10ML suspension 1 g  1 g Oral TID WC & HS Marrion CoyZhang, Dekui, MD   1 g at 03/01/22 1240   tiotropium (SPIRIVA) inhalation capsule ([facility] use ONLY) 18 mcg  1 capsule Inhalation QHS Lorretta HarpNiu, Xilin, MD       traZODone (DESYREL) tablet 100 mg  100 mg Oral QHS PRN Lorretta HarpNiu, Xilin, MD        Musculoskeletal: Strength & Muscle Tone: decreased Gait & Station:  did not observe Patient leans: N/A    Psychiatric Specialty Exam:  Presentation  General Appearance: Appropriate for Environment Eye Contact:Good Speech:Clear and Coherent Speech Volume:Normal Handedness:No data recorded  Mood and Affect  Mood:Euthymic Affect:Appropriate  Thought Process  Thought Processes:Coherent Descriptions of Associations:Intact  Orientation:Full (Time, Place and Person)  Thought Content:WDL; Logical  History of Schizophrenia/Schizoaffective disorder:No data recorded Duration of Psychotic Symptoms:No data recorded Hallucinations:Hallucinations: None  Ideas of Reference:None  Suicidal Thoughts:Suicidal Thoughts: No  Homicidal Thoughts:Homicidal Thoughts: No   Sensorium  Memory:No data  recorded Judgment:Good Insight:Good  Executive Functions  Concentration:Good Attention Span:Good Recall:Good Fund of Knowledge:Fair Language:Good  Psychomotor Activity  Psychomotor Activity:Psychomotor Activity: Normal  Assets  Assets:Communication Skills; Desire for Improvement; Financial Resources/Insurance; Social Support; Resilience  Sleep  Sleep:Sleep: Good  Physical Exam: Physical Exam Vitals and nursing note reviewed.  HENT:     Nose: No congestion or rhinorrhea.  Eyes:     General:        Right eye: No discharge.        Left eye: No discharge.  Cardiovascular:     Rate and Rhythm: Normal rate.  Pulmonary:     Effort: Pulmonary effort is normal.  Musculoskeletal:     Cervical back: Normal range of motion.  Skin:    General: Skin is dry.  Neurological:     Mental Status: She is alert and oriented to person, place, and time.  Psychiatric:        Behavior: Behavior normal.   Review of Systems  Constitutional: Negative.   HENT: Negative.    Eyes: Negative.   Respiratory: Negative.    Cardiovascular: Negative.   Gastrointestinal: Negative.   Musculoskeletal: Negative.   Skin: Negative.   Neurological: Negative.   Psychiatric/Behavioral:  Negative for depression, hallucinations, memory loss, substance abuse and suicidal ideas. The patient is not nervous/anxious and does not have insomnia.   Blood pressure 120/76, pulse 81, temperature 98.5 F (36.9 C), resp. rate 18, height 5\' 9"  (1.753 m), weight 95.3 kg, SpO2 96 %. Body mass index is 31.03 kg/m.  Treatment Plan Summary: Plan none. See above HPI note . Patient appears to have capacity for decision making and realizes that she is not able to complete ADLs independently at this point and cannot live independently. She may require more skilled assistance in the home to increase functional mobility.  Disposition: No evidence of imminent risk to self or others at present.  Patient on medical floor and will  discharge when medically cleared   Vanetta Mulders, NP 03/01/2022 1:45 PM

## 2022-03-07 ENCOUNTER — Inpatient Hospital Stay
Admission: EM | Admit: 2022-03-07 | Discharge: 2022-03-16 | DRG: 392 | Disposition: A | Discharge status: Home / Self Care | Payer: Medicaid Other | Attending: Internal Medicine | Admitting: Internal Medicine

## 2022-03-07 ENCOUNTER — Other Ambulatory Visit: Payer: Self-pay

## 2022-03-07 DIAGNOSIS — Z6836 Body mass index (BMI) 36.0-36.9, adult: Secondary | ICD-10-CM

## 2022-03-07 DIAGNOSIS — Z8673 Personal history of transient ischemic attack (TIA), and cerebral infarction without residual deficits: Secondary | ICD-10-CM

## 2022-03-07 DIAGNOSIS — G4730 Sleep apnea, unspecified: Secondary | ICD-10-CM | POA: Diagnosis present

## 2022-03-07 DIAGNOSIS — R112 Nausea with vomiting, unspecified: Secondary | ICD-10-CM | POA: Diagnosis present

## 2022-03-07 DIAGNOSIS — R110 Nausea: Principal | ICD-10-CM

## 2022-03-07 DIAGNOSIS — J45909 Unspecified asthma, uncomplicated: Secondary | ICD-10-CM | POA: Diagnosis present

## 2022-03-07 DIAGNOSIS — E46 Unspecified protein-calorie malnutrition: Secondary | ICD-10-CM | POA: Diagnosis present

## 2022-03-07 DIAGNOSIS — F319 Bipolar disorder, unspecified: Secondary | ICD-10-CM | POA: Diagnosis present

## 2022-03-07 DIAGNOSIS — Z91012 Allergy to eggs: Secondary | ICD-10-CM

## 2022-03-07 DIAGNOSIS — E860 Dehydration: Secondary | ICD-10-CM | POA: Diagnosis present

## 2022-03-07 DIAGNOSIS — E785 Hyperlipidemia, unspecified: Secondary | ICD-10-CM | POA: Diagnosis present

## 2022-03-07 DIAGNOSIS — Z91011 Allergy to milk products: Secondary | ICD-10-CM

## 2022-03-07 DIAGNOSIS — K219 Gastro-esophageal reflux disease without esophagitis: Secondary | ICD-10-CM | POA: Diagnosis present

## 2022-03-07 DIAGNOSIS — K3184 Gastroparesis: Principal | ICD-10-CM | POA: Diagnosis present

## 2022-03-07 DIAGNOSIS — F444 Conversion disorder with motor symptom or deficit: Secondary | ICD-10-CM | POA: Diagnosis present

## 2022-03-07 DIAGNOSIS — Z87440 Personal history of urinary (tract) infections: Secondary | ICD-10-CM

## 2022-03-07 DIAGNOSIS — Z8249 Family history of ischemic heart disease and other diseases of the circulatory system: Secondary | ICD-10-CM

## 2022-03-07 DIAGNOSIS — K2970 Gastritis, unspecified, without bleeding: Secondary | ICD-10-CM | POA: Diagnosis present

## 2022-03-07 DIAGNOSIS — E876 Hypokalemia: Secondary | ICD-10-CM | POA: Diagnosis present

## 2022-03-07 DIAGNOSIS — Q396 Congenital diverticulum of esophagus: Secondary | ICD-10-CM

## 2022-03-07 DIAGNOSIS — F32A Depression, unspecified: Secondary | ICD-10-CM | POA: Diagnosis present

## 2022-03-07 DIAGNOSIS — Z7401 Bed confinement status: Secondary | ICD-10-CM

## 2022-03-07 DIAGNOSIS — L89322 Pressure ulcer of left buttock, stage 2: Secondary | ICD-10-CM | POA: Diagnosis present

## 2022-03-07 DIAGNOSIS — Z79899 Other long term (current) drug therapy: Secondary | ICD-10-CM

## 2022-03-07 DIAGNOSIS — L89312 Pressure ulcer of right buttock, stage 2: Secondary | ICD-10-CM | POA: Diagnosis present

## 2022-03-07 DIAGNOSIS — I10 Essential (primary) hypertension: Secondary | ICD-10-CM | POA: Diagnosis present

## 2022-03-07 DIAGNOSIS — Z888 Allergy status to other drugs, medicaments and biological substances status: Secondary | ICD-10-CM

## 2022-03-07 DIAGNOSIS — Z90710 Acquired absence of both cervix and uterus: Secondary | ICD-10-CM

## 2022-03-07 DIAGNOSIS — K8020 Calculus of gallbladder without cholecystitis without obstruction: Secondary | ICD-10-CM | POA: Diagnosis present

## 2022-03-07 DIAGNOSIS — G43909 Migraine, unspecified, not intractable, without status migrainosus: Secondary | ICD-10-CM | POA: Diagnosis present

## 2022-03-07 DIAGNOSIS — E669 Obesity, unspecified: Secondary | ICD-10-CM | POA: Diagnosis present

## 2022-03-07 DIAGNOSIS — J449 Chronic obstructive pulmonary disease, unspecified: Secondary | ICD-10-CM | POA: Diagnosis present

## 2022-03-07 LAB — URINALYSIS, ROUTINE W REFLEX MICROSCOPIC
Bilirubin Urine: NEGATIVE
Glucose, UA: NEGATIVE mg/dL
Hgb urine dipstick: NEGATIVE
Ketones, ur: 20 mg/dL — AB
Leukocytes,Ua: NEGATIVE
Nitrite: NEGATIVE
Protein, ur: NEGATIVE mg/dL
Specific Gravity, Urine: 1.013 (ref 1.005–1.030)
pH: 6 (ref 5.0–8.0)

## 2022-03-07 LAB — CBC WITH DIFFERENTIAL/PLATELET
Abs Immature Granulocytes: 0.02 10*3/uL (ref 0.00–0.07)
Basophils Absolute: 0 10*3/uL (ref 0.0–0.1)
Basophils Relative: 1 %
Eosinophils Absolute: 0.1 10*3/uL (ref 0.0–0.5)
Eosinophils Relative: 2 %
HCT: 25.4 % — ABNORMAL LOW (ref 36.0–46.0)
Hemoglobin: 8.4 g/dL — ABNORMAL LOW (ref 12.0–15.0)
Immature Granulocytes: 0 %
Lymphocytes Relative: 38 %
Lymphs Abs: 1.9 10*3/uL (ref 0.7–4.0)
MCH: 27.5 pg (ref 26.0–34.0)
MCHC: 33.1 g/dL (ref 30.0–36.0)
MCV: 83 fL (ref 80.0–100.0)
Monocytes Absolute: 0.6 10*3/uL (ref 0.1–1.0)
Monocytes Relative: 12 %
Neutro Abs: 2.4 10*3/uL (ref 1.7–7.7)
Neutrophils Relative %: 47 %
Platelets: 222 10*3/uL (ref 150–400)
RBC: 3.06 MIL/uL — ABNORMAL LOW (ref 3.87–5.11)
RDW: 15.8 % — ABNORMAL HIGH (ref 11.5–15.5)
WBC: 5 10*3/uL (ref 4.0–10.5)
nRBC: 0 % (ref 0.0–0.2)

## 2022-03-07 LAB — COMPREHENSIVE METABOLIC PANEL
ALT: 13 U/L (ref 0–44)
AST: 17 U/L (ref 15–41)
Albumin: 2.5 g/dL — ABNORMAL LOW (ref 3.5–5.0)
Alkaline Phosphatase: 44 U/L (ref 38–126)
Anion gap: 13 (ref 5–15)
BUN: <5 mg/dL — ABNORMAL LOW (ref 6–20)
CO2: 31 mmol/L (ref 22–32)
Calcium: 8.3 mg/dL — ABNORMAL LOW (ref 8.9–10.3)
Chloride: 93 mmol/L — ABNORMAL LOW (ref 98–111)
Creatinine, Ser: 0.5 mg/dL (ref 0.44–1.00)
GFR, Estimated: >60 mL/min (ref >60)
Glucose, Bld: 73 mg/dL (ref 70–99)
Potassium: 2.9 mmol/L — ABNORMAL LOW (ref 3.5–5.1)
Sodium: 137 mmol/L (ref 135–145)
Total Bilirubin: 1.2 mg/dL (ref 0.3–1.2)
Total Protein: 5.5 g/dL — ABNORMAL LOW (ref 6.5–8.1)

## 2022-03-07 LAB — PHOSPHORUS: Phosphorus: 2.6 mg/dL (ref 2.5–4.6)

## 2022-03-07 LAB — TROPONIN I (HIGH SENSITIVITY)
Troponin I (High Sensitivity): 4 ng/L (ref <18)
Troponin I (High Sensitivity): 4 ng/L (ref <18)

## 2022-03-07 LAB — LACTIC ACID, PLASMA: Lactic Acid, Venous: 0.9 mmol/L (ref 0.5–1.9)

## 2022-03-07 LAB — MAGNESIUM: Magnesium: 2 mg/dL (ref 1.7–2.4)

## 2022-03-07 MED ORDER — DULOXETINE HCL 30 MG PO CPEP
30.0000 mg | ORAL_CAPSULE | Freq: Every day | ORAL | Status: DC
Start: 2022-03-08 — End: 2022-03-14
  Filled 2022-03-07: qty 1

## 2022-03-07 MED ORDER — PANTOPRAZOLE SODIUM 40 MG IV SOLR
40.0000 mg | Freq: Two times a day (BID) | INTRAVENOUS | Status: DC
  Administered 2022-03-07 – 2022-03-16 (×17): 40 mg via INTRAVENOUS
  Filled 2022-03-07 (×17): qty 10

## 2022-03-07 MED ORDER — POTASSIUM CHLORIDE 10 MEQ/100ML IV SOLN
10.0000 meq | Freq: Once | INTRAVENOUS | Status: AC
Start: 2022-03-07 — End: 2022-03-07
  Administered 2022-03-07: 10 meq via INTRAVENOUS
  Filled 2022-03-07: qty 100

## 2022-03-07 MED ORDER — TIOTROPIUM BROMIDE MONOHYDRATE 18 MCG IN CAPS
18.0000 ug | ORAL_CAPSULE | Freq: Every day | RESPIRATORY_TRACT | Status: DC
  Administered 2022-03-12 – 2022-03-15 (×4): 18 ug via RESPIRATORY_TRACT
  Filled 2022-03-07: qty 5

## 2022-03-07 MED ORDER — METOCLOPRAMIDE HCL 5 MG/ML IJ SOLN
10.0000 mg | Freq: Once | INTRAMUSCULAR | Status: AC
Start: 2022-03-07 — End: 2022-03-07
  Administered 2022-03-07: 10 mg via INTRAVENOUS
  Filled 2022-03-07: qty 2

## 2022-03-07 MED ORDER — FAMOTIDINE 20 MG PO TABS
20.0000 mg | ORAL_TABLET | Freq: Two times a day (BID) | ORAL | Status: DC
  Filled 2022-03-07 (×4): qty 1

## 2022-03-07 MED ORDER — PROCHLORPERAZINE EDISYLATE 10 MG/2ML IJ SOLN
10.0000 mg | Freq: Once | INTRAMUSCULAR | Status: DC
  Filled 2022-03-07: qty 2

## 2022-03-07 MED ORDER — ACETAMINOPHEN 325 MG PO TABS: 650.0000 mg | ORAL_TABLET | Freq: Four times a day (QID) | ORAL | Status: AC | PRN

## 2022-03-07 MED ORDER — ONDANSETRON HCL 4 MG/2ML IJ SOLN
4.0000 mg | Freq: Four times a day (QID) | INTRAMUSCULAR | Status: AC | PRN
  Administered 2022-03-09 – 2022-03-10 (×5): 4 mg via INTRAVENOUS
  Filled 2022-03-07 (×5): qty 2

## 2022-03-07 MED ORDER — SODIUM CHLORIDE 0.9 % IV BOLUS
1000.0000 mL | Freq: Once | INTRAVENOUS | Status: AC
  Administered 2022-03-07: 1000 mL via INTRAVENOUS

## 2022-03-07 MED ORDER — ACETAMINOPHEN 650 MG RE SUPP: 650.0000 mg | Freq: Four times a day (QID) | RECTAL | Status: AC | PRN

## 2022-03-07 MED ORDER — TRAZODONE HCL 100 MG PO TABS: 100.0000 mg | ORAL_TABLET | Freq: Every evening | ORAL | Status: DC | PRN

## 2022-03-07 MED ORDER — ONDANSETRON HCL 4 MG PO TABS: 4.0000 mg | ORAL_TABLET | Freq: Four times a day (QID) | ORAL | Status: AC | PRN

## 2022-03-07 MED ORDER — PANTOPRAZOLE SODIUM 40 MG PO TBEC
40.0000 mg | DELAYED_RELEASE_TABLET | Freq: Two times a day (BID) | ORAL | Status: DC
  Filled 2022-03-07: qty 1

## 2022-03-07 MED ORDER — ONDANSETRON HCL 4 MG/2ML IJ SOLN: 4.0000 mg | Freq: Once | INTRAMUSCULAR | Status: DC

## 2022-03-07 MED ORDER — POLYETHYLENE GLYCOL 3350 17 G PO PACK: 17.0000 g | PACK | Freq: Two times a day (BID) | ORAL | Status: DC | PRN

## 2022-03-07 MED ORDER — FOLIC ACID 1 MG PO TABS
5.0000 mg | ORAL_TABLET | Freq: Every day | ORAL | Status: DC
  Filled 2022-03-07 (×2): qty 5

## 2022-03-07 MED ORDER — LACTATED RINGERS IV SOLN
INTRAVENOUS | Status: DC
Start: 2022-03-07 — End: 2022-03-08

## 2022-03-07 MED ORDER — ATORVASTATIN CALCIUM 20 MG PO TABS
80.0000 mg | ORAL_TABLET | Freq: Every day | ORAL | Status: DC
  Filled 2022-03-07 (×2): qty 4

## 2022-03-07 MED ORDER — ENOXAPARIN SODIUM 60 MG/0.6ML IJ SOSY
55.0000 mg | PREFILLED_SYRINGE | Freq: Every day | INTRAMUSCULAR | Status: DC
  Administered 2022-03-07 – 2022-03-15 (×9): 55 mg via SUBCUTANEOUS
  Filled 2022-03-07 (×9): qty 0.6

## 2022-03-07 MED ORDER — POTASSIUM CHLORIDE 10 MEQ/100ML IV SOLN
10.0000 meq | INTRAVENOUS | Status: DC
  Administered 2022-03-07 – 2022-03-08 (×4): 10 meq via INTRAVENOUS
  Filled 2022-03-07 (×3): qty 100

## 2022-03-07 MED ORDER — IPRATROPIUM-ALBUTEROL 0.5-2.5 (3) MG/3ML IN SOLN
3.0000 mL | Freq: Three times a day (TID) | RESPIRATORY_TRACT | Status: DC | PRN
  Administered 2022-03-08: 09:00:00 3 mL via RESPIRATORY_TRACT
  Filled 2022-03-07: qty 3

## 2022-03-07 MED ORDER — METOCLOPRAMIDE HCL 5 MG/ML IJ SOLN
10.0000 mg | Freq: Four times a day (QID) | INTRAMUSCULAR | Status: DC | PRN
  Administered 2022-03-08: 05:00:00 10 mg via INTRAVENOUS
  Filled 2022-03-07: qty 2

## 2022-03-07 MED ORDER — BUDESONIDE 0.25 MG/2ML IN SUSP
0.2500 mg | Freq: Two times a day (BID) | RESPIRATORY_TRACT | Status: DC
  Administered 2022-03-08 – 2022-03-16 (×15): 0.25 mg via RESPIRATORY_TRACT
  Filled 2022-03-07 (×17): qty 2

## 2022-03-07 NOTE — Assessment & Plan Note (Signed)
-   Resumed home inhalers ?- DuoNebs 3 times daily as needed for shortness of breath ?

## 2022-03-07 NOTE — Progress Notes (Signed)
PHARMACIST - PHYSICIAN COMMUNICATION ? ?CONCERNING:  Enoxaparin (Lovenox) for DVT Prophylaxis  ? ? ?RECOMMENDATION: ?Patient was prescribed enoxaprin 40mg  q24 hours for VTE prophylaxis.  ? Weights  ? 03/07/22 1306  ?Weight: 113 kg (249 lb 1.9 oz)  ? ? ?Body mass index is 36.79 kg/m?. ? ?Estimated Creatinine Clearance: 101.5 mL/min (by C-G formula based on SCr of 0.5 mg/dL). ? ? ?Based on [facility] policy patient is candidate for enoxaparin 0.5mg /kg TBW SQ every 24 hours based on BMI being >30. ? ?DESCRIPTION: ?Pharmacy has adjusted enoxaparin dose per [facility] policy. ? ?Patient is now receiving enoxaparin 55 mg every 24 hours  ? ? ?[facility], PharmD, BCPS ?Clinical Pharmacist   ?03/07/2022 ?6:56 PM  ?

## 2022-03-07 NOTE — Assessment & Plan Note (Signed)
-   Presumed secondary to GI loss in setting of frequent nausea and vomiting ?- Status post potassium chloride 10 mill equivalents replaced by EDP ?- Ordered additional potassium chloride 10 mill equivalents every hour, x5 ?- Magnesium was 2.0 on ED labs ?- Check magnesium in the a.m. ?

## 2022-03-07 NOTE — Assessment & Plan Note (Signed)
-   Resumed home duloxetine 30 mg daily ?- Resume trazodone 100 mg nightly ?

## 2022-03-07 NOTE — Assessment & Plan Note (Signed)
-   Atorvastatin 80 mg nightly resumed ?

## 2022-03-07 NOTE — Assessment & Plan Note (Addendum)
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  Extrinsic compression in the distal esophagus.  A Nissan fundoplication was found.  The wrap appears intact.  Gastritis.  Which was biopsied.  Normal examined duodenum.  The examination was otherwise normal. ?- Recommendation from EGD on 02/13/2022: Follow gastroparesis diet ?- Supportive care: Ondansetron as needed for nausea and vomiting, Reglan 10 mg every 6 hours as needed for intractable nausea and vomiting ?- Patient states that she has difficulty following up with GI outpatient due to being bedbound ?- No urgent, staff message sent to Dr. Norma Fredrickson ?- Admit to telemetry medical, observation ?

## 2022-03-07 NOTE — Assessment & Plan Note (Signed)
-   Famotidine 20 mg twice daily ?- Resumed PPI with 40 mg from p.o. to IV twice daily ? ?

## 2022-03-07 NOTE — H&P (Signed)
History and Physical   VICY MEDICO BJY:[id number] DOB: November 02, 1961 DOA: 03/07/2022  PCP: Tiffany Dibbles, MD Outpatient Specialists: Dr. Norma Fredrickson, GI Patient coming from: home via EMS  I have personally briefly reviewed patient's old medical records in [facility] EMR.  Chief Concern: Nausea and vomiting  HPI: [name] is a 61 year female with history of gastritis, obesity, dyslipidemia, GERD, hyperlipidemia, frequent nausea and vomiting, hypokalemia, hiatal hernia, bipolar, who presents emergency department for chief concerns of nausea and vomiting.  Vitals in the emergency department showed temperature 98.9, respiration rate of 20, heart rate of 83, initial blood pressure 137/79, SPO2 of 97% on room air.  Serum sodium 137, potassium 2.9, chloride 93, bicarb 31, BUN less than 5, serum creatinine of 0.50, nonfasting blood glucose 73, GFR greater than 60.  WBC 5, hemoglobin 8.4, platelets of 222.  High sensitive troponin was 4.  UA was negative for leukocytes and nitrates.  ED treatment: Reglan 10 mg IV one-time dose, ondansetron 4 mg IV, Compazine 10 mg IV, sodium chloride 1 L bolus, potassium chloride 10 mill equivalent one-time dose of IV.  At bedside patient is able to tell me her name, her age, current calendar year.  She was able to move her upper and lower extremities.  She can wiggle her toes and slightly bend her knees.  She states that she cannot do more than 9 slight bending of her knee due to being bedridden.  She reports that she has been having nausea and vomiting for the last 4 days.  She reports that prior to 4 days ago she has been able to keep down broth.  She states that since November when she had strokelike symptoms, she has been bedridden, with frequent nausea and vomiting.  She states that in the last 4 days she cannot even keep down even water.  She tries to drink broth and drink water but is not able to keep anything down.  She has vomited so many times  she cannot count.  She states the vomitus is white clear.  She denies any changes to her medications, known sick contacts, chest pain, shortness of breath, dysuria, hematuria, diarrhea, dysphagia, cough, syncopal event.  Social history: She denies tobacco, EtOH, recreational drug use. She formally worked as a Conservation officer, nature.  Vaccination history: She is vaccinated for COVID-19, 3 doses. She states she cannot get influenza vaccine due to having a egg allergy.  ROS: Constitutional: no weight change, no fever ENT/Mouth: no sore throat, no rhinorrhea Eyes: no eye pain, no vision changes Cardiovascular: no chest pain, no dyspnea,  no edema, no palpitations Respiratory: no cough, no sputum, no wheezing Gastrointestinal: no nausea, no vomiting, no diarrhea, no constipation Genitourinary: no urinary incontinence, no dysuria, no hematuria Musculoskeletal: no arthralgias, no myalgias Skin: no skin lesions, no pruritus, Neuro: + weakness, no loss of consciousness, no syncope Psych: no anxiety, no depression, + decrease appetite Heme/Lymph: no bruising, no bleeding  ED Course: Discussed with emergency medicine provider, patient requiring hospitalization for chief concerns of intractable nausea and vomiting.  Assessment/Plan  Principal Problem:   Intractable nausea and vomiting Active Problems:   GERD (gastroesophageal reflux disease)   Primary hypertension   Bipolar 1 disorder (HCC)   Asthma, chronic   Dyslipidemia   HLD (hyperlipidemia)   COPD (chronic obstructive pulmonary disease) (HCC)   Migraine   Depression   Hypokalemia due to excessive gastrointestinal loss of potassium   * Intractable nausea and vomiting Assessment & Plan -  Etiology unclear at this time - Multiple admissions for same chief complaints - Patient had upper EGD on 02/13/2022: Which was read as diverticulum in the distal esophagus.  Extrinsic compression in the distal esophagus.  A Nissan fundoplication was found.  The  wrap appears intact.  Gastritis.  Which was biopsied.  Normal examined duodenum.  The examination was otherwise normal. - Recommendation from EGD on 02/13/2022: Follow gastroparesis diet - Supportive care: Ondansetron as needed for nausea and vomiting, Reglan 10 mg every 6 hours as needed for intractable nausea and vomiting - Admit to telemetry medical, observation  COPD (chronic obstructive pulmonary disease) (HCC) Assessment & Plan - Resumed home inhalers - DuoNebs 3 times daily as needed for shortness of breath  GERD (gastroesophageal reflux disease) Assessment & Plan - Famotidine 20 mg twice daily - Resumed PPI with 40 mg from p.o. to IV twice daily  Hypokalemia due to excessive gastrointestinal loss of potassium Assessment & Plan - Presumed secondary to GI loss in setting of frequent nausea and vomiting - Status post potassium chloride 10 mill equivalents replaced by EDP - Ordered additional potassium chloride 10 mill equivalents every hour, x5 - Magnesium was 2.0 on ED labs - Check magnesium in the a.m.  Depression Assessment & Plan - Resumed home duloxetine 30 mg daily - Resume trazodone 100 mg nightly  HLD (hyperlipidemia) Assessment & Plan - Atorvastatin 80 mg nightly resumed  Dyslipidemia Assessment & Plan - Atorvastatin 80 mg daily resumed  Chart reviewed.   Hospitalization from 02/24/2022 - 03/01/2022: Patient presented for nausea and vomiting, treated for with supportive measures. Discharging etiology was presumed to be secondary to nonadherence to gastro paresis diet.  Hospitalization from 02/07/2022 to 02/17/2022: Intractable nausea and vomiting.  Reglan was stopped, found to be unhelpful and patient developed diarrhea.  It was noted that patient refused scheduled Zofran before mealtime and preferred IV Compazine as needed.    DVT prophylaxis: Enoxaparin Code Status: Full code Diet: Heart healthy Family Communication: No Disposition Plan: Pending clinical  course Consults called:  Admission status: Telemetry medical, observation  Past Medical History:  Diagnosis Date   Asthma    Bipolar 1 disorder (HCC)    COPD (chronic obstructive pulmonary disease) (HCC)    CVA (cerebral vascular accident) (HCC)    Enlarged heart    GERD (gastroesophageal reflux disease)    Migraines    Sleep apnea    Past Surgical History:  Procedure Laterality Date   ABDOMINAL HYSTERECTOMY     ABDOMINAL SURGERY     "For acid reflux"   CESAREAN SECTION     ESOPHAGOGASTRODUODENOSCOPY N/A 02/13/2022   Procedure: ESOPHAGOGASTRODUODENOSCOPY (EGD);  Surgeon: Toledo, Boykin Nearing, MD;  Location: [facility] ENDOSCOPY;  Service: Gastroenterology;  Laterality: N/A;   OTHER SURGICAL HISTORY  01/09/1998   acid reflux, hole in stomach they closed   Social History:  reports that she has never smoked. She has never used smokeless tobacco. She reports that she does not drink alcohol and does not use drugs.  Allergies  Allergen Reactions   Dairy Aid [Tilactase] Other (See Comments)    Exacerbates asthma   Egg [Eggs Or Egg-Derived Products] Other (See Comments)    Exacerbated asthma   Milk-Related Compounds    Family History  Problem Relation Age of Onset   Hypertension Mother    Family history: Family history reviewed and not pertinent  Prior to Admission medications   Medication Sig Start Date End Date Taking? Authorizing Provider  acetaminophen (TYLENOL) 325 MG  tablet Take 2 tablets (650 mg total) by mouth every 6 (six) hours as needed for mild pain (or Fever >/= 101). 11/08/21   Lynn ItoAmery, Sahar, MD  atorvastatin (LIPITOR) 80 MG tablet Take 80 mg by mouth daily.    [provider]  calcium-vitamin D (OSCAL WITH D) 500-5 MG-MCG tablet Take 1 tablet by mouth daily with breakfast. 02/23/22   Enedina FinnerPatel, Sona, MD  diclofenac Sodium (VOLTAREN) 1 % GEL Apply 4 g topically 4 (four) times daily. 11/08/21   Lynn ItoAmery, Sahar, MD  diphenhydrAMINE (BENADRYL) 12.5 MG/5ML elixir Take 10 mLs  (25 mg total) by mouth 4 (four) times daily as needed. 02/18/22   Georga HackingMcHugh, Kelly Rose, MD  DULoxetine (CYMBALTA) 30 MG capsule Take 30 mg by mouth daily. 09/05/21 09/05/22  [provider]  famotidine (PEPCID) 20 MG tablet Take 1 tablet (20 mg total) by mouth 2 (two) times daily. 02/05/22   Sharman CheekStafford, Phillip, MD  FLOVENT HFA 110 MCG/ACT inhaler Inhale 2 puffs into the lungs 2 (two) times daily. 10/20/21   [provider]  folic acid (FOLVITE) 1 MG tablet Take 5 tablets (5 mg total) by mouth daily. 11/09/21   Lynn ItoAmery, Sahar, MD  loratadine (CLARITIN) 10 MG tablet Take 1 tablet (10 mg total) by mouth every evening. 11/08/21   Lynn ItoAmery, Sahar, MD  Multiple Vitamin (MULTIVITAMIN WITH MINERALS) TABS tablet Take 1 tablet by mouth daily. 11/09/21   Lynn ItoAmery, Sahar, MD  Nutritional Supplements (FEEDING SUPPLEMENT, KATE FARMS STANDARD 1.4,) LIQD liquid Take 325 mLs by mouth 3 (three) times daily between meals. 11/08/21   Lynn ItoAmery, Sahar, MD  ondansetron (ZOFRAN-ODT) 4 MG disintegrating tablet Take 1 tablet (4 mg total) by mouth every 8 (eight) hours as needed for nausea or vomiting. 02/17/22   Darlin PriestlyLai, Tina, MD  pantoprazole (PROTONIX) 40 MG tablet Take 40 mg by mouth 2 (two) times daily. 08/17/21   [provider]  polyethylene glycol (MIRALAX / GLYCOLAX) 17 g packet Take 17 g by mouth 2 (two) times daily as needed. 02/17/22   Darlin PriestlyLai, Tina, MD  SPIRIVA HANDIHALER 18 MCG inhalation capsule 1 capsule at bedtime. 08/02/21   [provider]  traZODone (DESYREL) 100 MG tablet Take 1 tablet (100 mg total) by mouth at bedtime as needed for sleep. 11/08/21   Lynn ItoAmery, Sahar, MD   Physical Exam: Vitals:   03/07/22 1330 03/07/22 1819 03/07/22 1900 03/07/22 2002  BP: (!) 141/76 129/75 121/89 119/64  Pulse: 82 79 93 83  Resp: 20 16 19 18   Temp:    98.3 F (36.8 C)  TempSrc:    Oral  SpO2: 97% 98% 99% 96%  Weight:      Height:       Constitutional: appears older than chronological age, NAD, calm,  comfortable Eyes: PERRL, lids and conjunctivae normal ENMT: Mucous membranes are moist.  Upper dentures are lost.  Posterior pharynx clear of any exudate or lesions.  Poor dentition. Hearing appropriate/loss Neck: normal, supple, no masses, no thyromegaly Respiratory: clear to auscultation bilaterally, no wheezing, no crackles. Normal respiratory effort. No accessory muscle use.  Cardiovascular: Regular rate and rhythm, no murmurs / rubs / gallops. No extremity edema. 2+ pedal pulses. No carotid bruits.  Abdomen: Obese abdomen, no tenderness, no masses palpated, no hepatosplenomegaly. Bowel sounds positive.  Musculoskeletal: no clubbing / cyanosis. No joint deformity upper and lower extremities. Good ROM, no contractures, no atrophy. Normal muscle tone.  Skin: no rashes, lesions, ulcers. No induration Neurologic: Sensation intact. Strength 5/5 in all 4.  Psychiatric: Normal judgment and insight. Alert and oriented x 3. Normal mood.   EKG: independently reviewed, showing sinus rhythm with rate of 74, QTc 446  Chest x-ray on Admission: Not indicated at this time  Labs on Admission: I have personally reviewed following labs  CBC: Recent Labs  Lab 03/01/22 0434 03/07/22 1314  WBC 4.2 5.0  NEUTROABS 1.6* 2.4  HGB 8.4* 8.4*  HCT 25.0* 25.4*  MCV 82.5 83.0  PLT 195 222   Basic Metabolic Panel: Recent Labs  Lab 03/01/22 0434 03/07/22 1314 03/07/22 1332 03/07/22 2008  NA 134* 137  --   --   K 3.4* 2.9*  --   --   CL 99 93*  --   --   CO2 26 31  --   --   GLUCOSE 72 73  --   --   BUN <5* <5*  --   --   CREATININE 0.43* 0.50  --   --   CALCIUM 7.9* 8.3*  --   --   MG 1.6*  --  2.0  --   PHOS  --   --   --  2.6   GFR: Estimated Creatinine Clearance: 101.5 mL/min (by C-G formula based on SCr of 0.5 mg/dL).  Liver Function Tests: Recent Labs  Lab 03/07/22 1314  AST 17  ALT 13  ALKPHOS 44  BILITOT 1.2  PROT 5.5*  ALBUMIN 2.5*   Urine analysis:    Component Value  Date/Time   COLORURINE YELLOW (A) 03/07/2022 1314   APPEARANCEUR HAZY (A) 03/07/2022 1314   APPEARANCEUR Clear 05/14/2013 1809   LABSPEC 1.013 03/07/2022 1314   LABSPEC 1.013 05/14/2013 1809   PHURINE 6.0 03/07/2022 1314   GLUCOSEU NEGATIVE 03/07/2022 1314   GLUCOSEU Negative 05/14/2013 1809   HGBUR NEGATIVE 03/07/2022 1314   BILIRUBINUR NEGATIVE 03/07/2022 1314   BILIRUBINUR Negative 05/14/2013 1809   KETONESUR 20 (A) 03/07/2022 1314   PROTEINUR NEGATIVE 03/07/2022 1314   NITRITE NEGATIVE 03/07/2022 1314   LEUKOCYTESUR NEGATIVE 03/07/2022 1314   LEUKOCYTESUR Negative 05/14/2013 1809   Dr. Sedalia Muta [facility]  If 7PM-7AM, please contact overnight-coverage provider If 7AM-7PM, please contact day coverage provider [email or web address]  03/07/2022, 11:02 PM

## 2022-03-07 NOTE — ED Provider Notes (Signed)
? ?[facility] ?Provider Note ? ? ? Event Date/Time  ? First MD Initiated Contact with Patient 03/07/22 1504   ?  (approximate) ? ? ?History  ? ?Chief Complaint ?Nausea (Pt reports feeling "sick" since November when she had stroke like symptoms. Reports being seen here and negative stroke work up at that time. Pt reports that she has been bedridden since then with intermittent N/V. States she takes zofran, but has not worked today. Last oral intake yesterday and last episode of emesis this AM. Pt A&O at this time CBG 86 and in NAD. ) ? ? ?HPI ?Tiffany [name] is a 61 y.o. female, history of bipolar 1, asthma, migraines, GERD, thrombocytopenia, refractory nausea/vomiting, hypertension, COPD, depression, presents to the emergency department for evaluation of nausea.  Patient states that she has been in the hospital several times for refractory nausea/vomiting and they have been unable to identify what is causing it.  She states that every time that she eats, the food comes right back up within about 5 minutes.  She states that she has had this problem ever since November, when she had a stroke.  She states that she has lost over 150 pounds since then.  She states that she has been unable to follow-up with a gastroenterologist due to logistical concerns, as she is bedridden and husband cannot lift her to take her to clinic.  Denies fever/chills, abdominal pain, chest pain, shortness of breath, headache, dizziness, lightheadedness, rashes, urinary symptoms, or  ? ? ?Per  records review, She was recently admitted here on 02/24/2022 for the same symptoms.  She is found to have a E. coli urinary tract infection which was treated.  Her nausea/vomiting was controlled with metoclopramide and she was able to eat minced foods.  She was discharged on 03/01/2022. ? ? ?History Limitations: No limitations. ? ?  ? ? ?Physical Exam  ?Triage Vital Signs: ?ED Triage Vitals  ?Enc Vitals Group  ?   BP 03/07/22 1305  137/79  ?   Pulse Rate 03/07/22 1305 87  ?   Resp 03/07/22 1305 18  ?   Temp 03/07/22 1305 98.9 ?F (37.2 ?C)  ?   Temp Source 03/07/22 1305 Oral  ?   SpO2 03/07/22 1305 97 %  ?   Weight 03/07/22 1306 249 lb 1.9 oz (113 kg)  ?   Height 03/07/22 1306 5\' 9"  (1.753 m)  ?   Head Circumference --   ?   Peak Flow --   ?   Pain Score 03/07/22 1306 8  ?   Pain Loc --   ?   Pain Edu? --   ?   Excl. in GC? --   ? ? ?Most recent vital signs: ?Vitals:  ? 03/07/22 1900 03/07/22 2002  ?BP: 121/89 119/64  ?Pulse: 93 83  ?Resp: 19 18  ?Temp:  98.3 ?F (36.8 ?C)  ?SpO2: 99% 96%  ? ? ?General: Awake, NAD.  ?CV: Good peripheral perfusion.  ?Resp: Normal effort.  ?Abd: Soft, non-tender. No distention.  ?Neuro: At baseline. No gross neurological deficits. ?Other: None. ? ?Physical Exam ? ? ? ?ED Results / Procedures / Treatments  ?Labs ?(all labs ordered are listed, but only abnormal results are displayed) ?Labs Reviewed  ?URINALYSIS, ROUTINE W REFLEX MICROSCOPIC - Abnormal; Notable for the following components:  ?    Result Value  ? Color, Urine YELLOW (*)   ? APPearance HAZY (*)   ? Ketones, ur 20 (*)   ?  All other components within normal limits  ?CBC WITH DIFFERENTIAL/PLATELET - Abnormal; Notable for the following components:  ? RBC 3.06 (*)   ? Hemoglobin 8.4 (*)   ? HCT 25.4 (*)   ? RDW 15.8 (*)   ? All other components within normal limits  ?COMPREHENSIVE METABOLIC PANEL - Abnormal; Notable for the following components:  ? Potassium 2.9 (*)   ? Chloride 93 (*)   ? BUN <5 (*)   ? Calcium 8.3 (*)   ? Total Protein 5.5 (*)   ? Albumin 2.5 (*)   ? All other components within normal limits  ?LACTIC ACID, PLASMA  ?MAGNESIUM  ?PHOSPHORUS  ?BASIC METABOLIC PANEL  ?CBC  ?MAGNESIUM  ?TROPONIN I (HIGH SENSITIVITY)  ?TROPONIN I (HIGH SENSITIVITY)  ? ? ? ?EKG ?Sinus rhythm, rate 74, no ST segment changes, no axis deviations, no AV blocks, normal QRS interval ? ? ?RADIOLOGY ? ?ED Provider Interpretation: Not applicable. ? ?No results  found. ? ?PROCEDURES: ? ?Critical Care performed: None. ? ?Procedures ? ? ? ?MEDICATIONS ORDERED IN ED: ?Medications  ?prochlorperazine (COMPAZINE) injection 10 mg (10 mg Intravenous Not Given 03/07/22 1814)  ?atorvastatin (LIPITOR) tablet 80 mg (has no administration in time range)  ?DULoxetine (CYMBALTA) DR capsule 30 mg (has no administration in time range)  ?traZODone (DESYREL) tablet 100 mg (has no administration in time range)  ?famotidine (PEPCID) tablet 20 mg (20 mg Oral Patient Refused/Not Given 03/07/22 2143)  ?pantoprazole (PROTONIX) EC tablet 40 mg (40 mg Oral Patient Refused/Not Given 03/07/22 2143)  ?polyethylene glycol (MIRALAX / GLYCOLAX) packet 17 g (has no administration in time range)  ?folic acid (FOLVITE) tablet 5 mg (has no administration in time range)  ?acetaminophen (TYLENOL) tablet 650 mg (has no administration in time range)  ?  Or  ?acetaminophen (TYLENOL) suppository 650 mg (has no administration in time range)  ?ondansetron (ZOFRAN) tablet 4 mg (has no administration in time range)  ?  Or  ?ondansetron (ZOFRAN) injection 4 mg (has no administration in time range)  ?enoxaparin (LOVENOX) injection 55 mg (55 mg Subcutaneous Given 03/07/22 2143)  ?metoCLOPramide (REGLAN) injection 10 mg (has no administration in time range)  ?potassium chloride 10 mEq in 100 mL IVPB (10 mEq Intravenous New Bag/Given 03/07/22 2034)  ?lactated ringers infusion ( Intravenous New Bag/Given 03/07/22 2027)  ?potassium chloride 10 mEq in 100 mL IVPB (0 mEq Intravenous Stopped 03/07/22 1721)  ?metoCLOPramide (REGLAN) injection 10 mg (10 mg Intravenous Given 03/07/22 1608)  ?sodium chloride 0.9 % bolus 1,000 mL (1,000 mLs Intravenous New Bag/Given 03/07/22 1617)  ? ? ? ?IMPRESSION / MDM / ASSESSMENT AND PLAN / ED COURSE  ?I reviewed the triage vital signs and the nursing notes. ?             ?               ? ? ?Differential diagnosis includes, but is not limited to, refractory nausea/vomiting, esophageal motility disorder, cystitis,  pyelonephritis, nephrolithiasis, cholecystitis, ? ?ED Course ?Patient appears well.  Vital signs within normal limits.  NAD.  Given endorsement of nausea and vomiting, we will go ahead and initiate IV fluids and metoclopramide. ? ?CBC shows no evidence of leukocytosis.  Notably anemic at 8.4, though this is consistent with her previous findings.  ? ?CMP notable for hypokalemia at 2.9.  Will supplement with IV potassium.  Heart hypoalbuminemia at 2.5.  No evidence of transaminitis or kidney injury. ? ?Magnesium unremarkable at 2.0.  Lactic acid unremarkable at  0.9. ? ?Initial EKG unremarkable.  Initial troponin 4.  Unlikely ACS or acute cardiac pathology. ? ?Urinalysis notable for mild ketonuria, no evidence of infection though. ? ?Attempted p.o. challenge following IV fluids and metoclopramide.  Patient unable to hold down applesauce, crackers, water.  Attempted to give her ondansetron or Compazine, however patient is refusing because she states that she has tried these medications before and they have not helped.  ? ?Assessment/Plan ?Patient presents to the emergency department for refractory nausea/vomiting, unclear etiology. Patient has had negative abdominal pelvic CT scan as well as a negative EGD this past month.  Endorses significant weight loss of over 100 pounds since November, 2022.  Unable to hold down any food/fluids at home.  Patient and fianc? additionally state that she is unable to go to her regular doctors appointments or follow-up with GI because he is unable to lift her to take her into clinic.  Given the patient's inability to care for herself at home, will plan to admit for social work/placement.  Spoke with the on-call hospitalist, Dr. Sedalia Mutaox, who agreed to admission. ? ? ?  ? ? ?FINAL CLINICAL IMPRESSION(S) / ED DIAGNOSES  ? ?Final diagnoses:  ?None  ? ? ? ?Rx / DC Orders  ? ?ED Discharge Orders   ? ? None  ? ?  ? ? ? ?Note:  This document was prepared using Dragon voice recognition software and  may include unintentional dictation errors. ?  ?Varney DailySimpson, Cornelious Diven Lee, [place] ?03/07/22 2148 ? ?  ?Ward, Layla MawKristen N, DO ?03/10/22 2354 ? ?

## 2022-03-07 NOTE — Hospital Course (Signed)
Ms. Tene Gato is a 61 year female with history of gastritis, obesity, dyslipidemia, GERD, hyperlipidemia, frequent nausea and vomiting, hypokalemia, hiatal hernia, bipolar, who presents emergency department for chief concerns of nausea and vomiting. ? ?Vitals in the emergency department showed temperature 98.9, respiration rate of 20, heart rate of 83, initial blood pressure 137/79, SPO2 of 97% on room air. ? ?Serum sodium 137, potassium 2.9, chloride 93, bicarb 31, BUN less than 5, serum creatinine of 0.50, nonfasting blood glucose 73, GFR greater than 60. ? ?WBC 5, hemoglobin 8.4, platelets of 222. ? ?High sensitive troponin was 4. ? ?UA was negative for leukocytes and nitrates. ? ?ED treatment: Reglan 10 mg IV one-time dose, ondansetron 4 mg IV, Compazine 10 mg IV, sodium chloride 1 L bolus, potassium chloride 10 mill equivalent one-time dose of IV. ?

## 2022-03-07 NOTE — Assessment & Plan Note (Signed)
-   Atorvastatin 80 mg daily resumed 

## 2022-03-08 ENCOUNTER — Inpatient Hospital Stay: Payer: Medicaid Other

## 2022-03-08 DIAGNOSIS — F444 Conversion disorder with motor symptom or deficit: Secondary | ICD-10-CM | POA: Diagnosis present

## 2022-03-08 DIAGNOSIS — Q396 Congenital diverticulum of esophagus: Secondary | ICD-10-CM | POA: Diagnosis not present

## 2022-03-08 DIAGNOSIS — L89312 Pressure ulcer of right buttock, stage 2: Secondary | ICD-10-CM | POA: Diagnosis present

## 2022-03-08 DIAGNOSIS — Z90710 Acquired absence of both cervix and uterus: Secondary | ICD-10-CM | POA: Diagnosis not present

## 2022-03-08 DIAGNOSIS — Z6836 Body mass index (BMI) 36.0-36.9, adult: Secondary | ICD-10-CM | POA: Diagnosis not present

## 2022-03-08 DIAGNOSIS — E876 Hypokalemia: Secondary | ICD-10-CM | POA: Diagnosis present

## 2022-03-08 DIAGNOSIS — E785 Hyperlipidemia, unspecified: Secondary | ICD-10-CM | POA: Diagnosis present

## 2022-03-08 DIAGNOSIS — E669 Obesity, unspecified: Secondary | ICD-10-CM | POA: Diagnosis present

## 2022-03-08 DIAGNOSIS — K2970 Gastritis, unspecified, without bleeding: Secondary | ICD-10-CM | POA: Diagnosis present

## 2022-03-08 DIAGNOSIS — E46 Unspecified protein-calorie malnutrition: Secondary | ICD-10-CM | POA: Diagnosis present

## 2022-03-08 DIAGNOSIS — L89322 Pressure ulcer of left buttock, stage 2: Secondary | ICD-10-CM | POA: Diagnosis present

## 2022-03-08 DIAGNOSIS — K219 Gastro-esophageal reflux disease without esophagitis: Secondary | ICD-10-CM | POA: Diagnosis present

## 2022-03-08 DIAGNOSIS — Z7401 Bed confinement status: Secondary | ICD-10-CM | POA: Diagnosis not present

## 2022-03-08 DIAGNOSIS — J449 Chronic obstructive pulmonary disease, unspecified: Secondary | ICD-10-CM | POA: Diagnosis present

## 2022-03-08 DIAGNOSIS — K8020 Calculus of gallbladder without cholecystitis without obstruction: Secondary | ICD-10-CM | POA: Diagnosis present

## 2022-03-08 DIAGNOSIS — E860 Dehydration: Secondary | ICD-10-CM | POA: Diagnosis present

## 2022-03-08 DIAGNOSIS — I10 Essential (primary) hypertension: Secondary | ICD-10-CM | POA: Diagnosis present

## 2022-03-08 DIAGNOSIS — Z87440 Personal history of urinary (tract) infections: Secondary | ICD-10-CM | POA: Diagnosis not present

## 2022-03-08 DIAGNOSIS — R112 Nausea with vomiting, unspecified: Secondary | ICD-10-CM | POA: Diagnosis present

## 2022-03-08 DIAGNOSIS — F319 Bipolar disorder, unspecified: Secondary | ICD-10-CM | POA: Diagnosis present

## 2022-03-08 DIAGNOSIS — Z8673 Personal history of transient ischemic attack (TIA), and cerebral infarction without residual deficits: Secondary | ICD-10-CM | POA: Diagnosis not present

## 2022-03-08 DIAGNOSIS — K3184 Gastroparesis: Secondary | ICD-10-CM | POA: Diagnosis present

## 2022-03-08 DIAGNOSIS — Z8249 Family history of ischemic heart disease and other diseases of the circulatory system: Secondary | ICD-10-CM | POA: Diagnosis not present

## 2022-03-08 DIAGNOSIS — G43909 Migraine, unspecified, not intractable, without status migrainosus: Secondary | ICD-10-CM | POA: Diagnosis present

## 2022-03-08 LAB — BASIC METABOLIC PANEL
Anion gap: 13 (ref 5–15)
BUN: <5 mg/dL — ABNORMAL LOW (ref 6–20)
CO2: 29 mmol/L (ref 22–32)
Calcium: 8.3 mg/dL — ABNORMAL LOW (ref 8.9–10.3)
Chloride: 95 mmol/L — ABNORMAL LOW (ref 98–111)
Creatinine, Ser: 0.42 mg/dL — ABNORMAL LOW (ref 0.44–1.00)
GFR, Estimated: >60 mL/min (ref >60)
Glucose, Bld: 68 mg/dL — ABNORMAL LOW (ref 70–99)
Potassium: 3.2 mmol/L — ABNORMAL LOW (ref 3.5–5.1)
Sodium: 137 mmol/L (ref 135–145)

## 2022-03-08 LAB — CBC
HCT: 24.3 % — ABNORMAL LOW (ref 36.0–46.0)
Hemoglobin: 8.3 g/dL — ABNORMAL LOW (ref 12.0–15.0)
MCH: 28 pg (ref 26.0–34.0)
MCHC: 34.2 g/dL (ref 30.0–36.0)
MCV: 82.1 fL (ref 80.0–100.0)
Platelets: 191 10*3/uL (ref 150–400)
RBC: 2.96 MIL/uL — ABNORMAL LOW (ref 3.87–5.11)
RDW: 15.6 % — ABNORMAL HIGH (ref 11.5–15.5)
WBC: 4.9 10*3/uL (ref 4.0–10.5)
nRBC: 0 % (ref 0.0–0.2)

## 2022-03-08 LAB — MAGNESIUM: Magnesium: 1.4 mg/dL — ABNORMAL LOW (ref 1.7–2.4)

## 2022-03-08 MED ORDER — BOOST / RESOURCE BREEZE PO LIQD CUSTOM
1.0000 | Freq: Three times a day (TID) | ORAL | Status: DC
Start: 2022-03-08 — End: 2022-03-14

## 2022-03-08 MED ORDER — POTASSIUM CHLORIDE CRYS ER 20 MEQ PO TBCR: 40.0000 meq | EXTENDED_RELEASE_TABLET | Freq: Once | ORAL | Status: DC

## 2022-03-08 MED ORDER — GADOBUTROL 1 MMOL/ML IV SOLN
10.0000 mL | Freq: Once | INTRAVENOUS | Status: AC | PRN
  Administered 2022-03-08: 15:00:00 10 mL via INTRAVENOUS

## 2022-03-08 MED ORDER — PROSOURCE PLUS PO LIQD
30.0000 mL | Freq: Three times a day (TID) | ORAL | Status: DC
  Filled 2022-03-08 (×20): qty 30

## 2022-03-08 MED ORDER — SODIUM CHLORIDE 0.9 % IV SOLN: INTRAVENOUS | Status: DC

## 2022-03-08 MED ORDER — ASCORBIC ACID 500 MG PO TABS
500.0000 mg | ORAL_TABLET | Freq: Two times a day (BID) | ORAL | Status: DC
  Filled 2022-03-08 (×2): qty 1

## 2022-03-08 MED ORDER — METOCLOPRAMIDE HCL 5 MG/ML IJ SOLN
10.0000 mg | Freq: Three times a day (TID) | INTRAMUSCULAR | Status: DC
Start: 2022-03-08 — End: 2022-03-09
  Administered 2022-03-08 – 2022-03-09 (×3): 10 mg via INTRAVENOUS
  Filled 2022-03-08 (×3): qty 2

## 2022-03-08 MED ORDER — SODIUM CHLORIDE 0.9 % IV SOLN
12.5000 mg | Freq: Four times a day (QID) | INTRAVENOUS | Status: DC | PRN
  Administered 2022-03-08 – 2022-03-14 (×5): 12.5 mg via INTRAVENOUS
  Filled 2022-03-08: qty 0.5
  Filled 2022-03-08: qty 12.5
  Filled 2022-03-08 (×3): qty 0.5
  Filled 2022-03-08: qty 12.5
  Filled 2022-03-08: qty 0.5

## 2022-03-08 MED ORDER — MAGNESIUM SULFATE 2 GM/50ML IV SOLN
2.0000 g | Freq: Once | INTRAVENOUS | Status: AC
  Administered 2022-03-08: 08:00:00 2 g via INTRAVENOUS
  Filled 2022-03-08: qty 50

## 2022-03-08 MED ORDER — ZINC SULFATE 220 (50 ZN) MG PO CAPS
220.0000 mg | ORAL_CAPSULE | Freq: Every day | ORAL | Status: DC
  Filled 2022-03-08 (×2): qty 1

## 2022-03-08 MED ORDER — LORAZEPAM 2 MG/ML IJ SOLN: 0.5000 mg | Freq: Once | INTRAMUSCULAR | Status: DC

## 2022-03-08 MED ORDER — POTASSIUM CHLORIDE 10 MEQ/100ML IV SOLN
10.0000 meq | INTRAVENOUS | Status: AC
  Administered 2022-03-08 (×4): 10 meq via INTRAVENOUS
  Filled 2022-03-08 (×3): qty 100

## 2022-03-08 MED ORDER — ADULT MULTIVITAMIN W/MINERALS CH
1.0000 | ORAL_TABLET | Freq: Every day | ORAL | Status: DC
  Filled 2022-03-08 (×2): qty 1

## 2022-03-08 NOTE — Progress Notes (Signed)
?PROGRESS NOTE ? ? ? ?Tiffany [name]  ZOX:[id number] DOB: 11-Dec-1961 DOA: 03/07/2022 ?PCP: Eddie DibblesAllison, James H, MD  ? ? ?Brief Narrative:  ?[age] year female with history of gastritis, obesity, dyslipidemia, GERD, hyperlipidemia, frequent nausea and vomiting, hypokalemia, hiatal hernia, bipolar, who presents emergency department for chief concerns of nausea and vomiting. ? ?Patient has had numerous readmissions for similar complaints.  The exact etiology of her nausea and vomiting has not been identified.  Patient had recent EGD and extensive CT imaging survey without identifiable cause.  She has been assigned a diagnosis of gastroparesis but does not have a history of diabetes. ? ?GI consulted, case discussed at length with GI service PA.  No plans for repeat endoscopic evaluation as it is unlikely to change management. ? ? ?Assessment & Plan: ?  ?Principal Problem: ?  Intractable nausea and vomiting ?Active Problems: ?  GERD (gastroesophageal reflux disease) ?  Primary hypertension ?  Bipolar 1 disorder (HCC) ?  Asthma, chronic ?  Dyslipidemia ?  HLD (hyperlipidemia) ?  COPD (chronic obstructive pulmonary disease) (HCC) ?  Migraine ?  Depression ?  Hypokalemia due to excessive gastrointestinal loss of potassium ? ?* Intractable nausea and vomiting ?Etiology is unclear ?Multiple admissions for the same ?Upper EGD on 2/14.  Unclear nature of extrinsic compression of the distal esophagus ?Recent CT chest and CT neck does not demonstrate any soft tissue mass or other etiology for external compression ?GI consulted ?Plan: ?Scheduled Reglan 10 mg IV 3 times daily ?IV fluids ?Gastroparesis diet ? ? ?  ?COPD (chronic obstructive pulmonary disease) (HCC) ?No evidence of acute exacerbation ?Plan: ?Resume home inhalers ?DuoNebs 3 times daily ?Supplemental oxygen as needed ?  ?GERD (gastroesophageal reflux disease) ?Twice daily PPI and H2 blocker ?  ?History of CVA ?Functional paraplegia ?Discussed with GI ?Patient has left-sided  weakness ?Per family at bedside she has been unable to ambulate ?Plan: ?MRI brain with and without contrast ?PT and OT evaluations ? ?Hypokalemia due to excessive gastrointestinal loss of potassium ?Presumed secondary to GI loss ?Monitor and replace as necessary ? ?  ?Depression ?Assessment & Plan ?- Resumed home duloxetine 30 mg daily ?- Resume trazodone 100 mg nightly ?  ?HLD (hyperlipidemia) ?Assessment & Plan ?- Atorvastatin 80 mg nightly resumed ?  ?Dyslipidemia ?Assessment & Plan ?- Atorvastatin 80 mg daily resumed ? ? ? ? ?DVT prophylaxis: SQ Lovenox ?Code Status: Full ?Family Communication: Spouse at bedside 3/9 ?Disposition Plan: Status is: Inpatient ?Remains inpatient appropriate because: Intractable nausea and vomiting requiring IV antiemetics.  Inability to tolerate p.o. ? ? ? ?Level of care: Med-Surg ? ?Consultants:  ?GI ? ?Procedures:  ?None ? ?Antimicrobials: ?None ? ? ?Subjective: ?Patient seen and examined.  Reports no pain but inability to tolerate anything by mouth. ? ?Objective: ?Vitals:  ? 03/07/22 2002 03/08/22 0447 03/08/22 0848 03/08/22 0900  ?BP: 119/64 124/67  109/60  ?Pulse: 83 92  89  ?Resp: 18 13  18   ?Temp: 98.3 ?F (36.8 ?C) 98.3 ?F (36.8 ?C)  98.1 ?F (36.7 ?C)  ?TempSrc: Oral Oral  Oral  ?SpO2: 96% 96% 98% 94%  ?Weight:      ?Height:      ? ? ?Intake/Output Summary (Last 24 hours) at 03/08/2022 1312 ?Last data filed at 03/08/2022 0447 ?Gross per 24 hour  ?Intake 799.77 ml  ?Output 500 ml  ?Net 299.77 ml  ? ?Filed Weights  ? 03/07/22 1306  ?Weight: 113 kg  ? ? ?Examination: ? ?General exam: No acute distress. ?  Respiratory system: Clear to auscultation. Respiratory effort normal. ?Cardiovascular system: S1-S2, RRR, no murmurs, no pedal edema ?Gastrointestinal system: Obese, NT/ND, normal bowel sounds ?Central nervous system: Alert and oriented. No focal neurological deficits. ?Extremities: Symmetric 5 x 5 power. ?Skin: No rashes, lesions or ulcers ?Psychiatry: Judgement and insight appear  normal. Mood & affect appropriate.  ? ? ? ?Data Reviewed: I have personally reviewed following labs and imaging studies ? ?CBC: ?Recent Labs  ?Lab 03/07/22 ?1314 03/08/22 ?8527  ?WBC 5.0 4.9  ?NEUTROABS 2.4  --   ?HGB 8.4* 8.3*  ?HCT 25.4* 24.3*  ?MCV 83.0 82.1  ?PLT 222 191  ? ?Basic Metabolic Panel: ?Recent Labs  ?Lab 03/07/22 ?1314 03/07/22 ?1332 03/07/22 ?2008 03/08/22 ?7824  ?NA 137  --   --  137  ?K 2.9*  --   --  3.2*  ?CL 93*  --   --  95*  ?CO2 31  --   --  29  ?GLUCOSE 73  --   --  68*  ?BUN <5*  --   --  <5*  ?CREATININE 0.50  --   --  0.42*  ?CALCIUM 8.3*  --   --  8.3*  ?MG  --  2.0  --  1.4*  ?PHOS  --   --  2.6  --   ? ?GFR: ?Estimated Creatinine Clearance: 101.5 mL/min (A) (by C-G formula based on SCr of 0.42 mg/dL (L)). ?Liver Function Tests: ?Recent Labs  ?Lab 03/07/22 ?1314  ?AST 17  ?ALT 13  ?ALKPHOS 44  ?BILITOT 1.2  ?PROT 5.5*  ?ALBUMIN 2.5*  ? ?No results for input(s): LIPASE, AMYLASE in the last 168 hours. ?No results for input(s): AMMONIA in the last 168 hours. ?Coagulation Profile: ?No results for input(s): INR, PROTIME in the last 168 hours. ?Cardiac Enzymes: ?No results for input(s): CKTOTAL, CKMB, CKMBINDEX, TROPONINI in the last 168 hours. ?BNP (last 3 results) ?No results for input(s): PROBNP in the last 8760 hours. ?HbA1C: ?No results for input(s): HGBA1C in the last 72 hours. ?CBG: ?No results for input(s): GLUCAP in the last 168 hours. ?Lipid Profile: ?No results for input(s): CHOL, HDL, LDLCALC, TRIG, CHOLHDL, LDLDIRECT in the last 72 hours. ?Thyroid Function Tests: ?No results for input(s): TSH, T4TOTAL, FREET4, T3FREE, THYROIDAB in the last 72 hours. ?Anemia Panel: ?No results for input(s): VITAMINB12, FOLATE, FERRITIN, TIBC, IRON, RETICCTPCT in the last 72 hours. ?Sepsis Labs: ?Recent Labs  ?Lab 03/07/22 ?1314  ?LATICACIDVEN 0.9  ? ? ?No results found for this or any previous visit (from the past 240 hour(s)).  ? ? ? ? ? ?Radiology Studies: ?No results  found. ? ? ? ? ? ?Scheduled Meds: ? atorvastatin  80 mg Oral Daily  ? budesonide  0.25 mg Nebulization BID  ? DULoxetine  30 mg Oral Daily  ? enoxaparin (LOVENOX) injection  55 mg Subcutaneous QHS  ? famotidine  20 mg Oral BID  ? folic acid  5 mg Oral Daily  ? LORazepam  0.5 mg Intravenous Once  ? metoCLOPramide (REGLAN) injection  10 mg Intravenous Q8H  ? pantoprazole (PROTONIX) IV  40 mg Intravenous BID  ? prochlorperazine  10 mg Intravenous Once  ? tiotropium  18 mcg Inhalation QHS  ? ?Continuous Infusions: ? sodium chloride 100 mL/hr at 03/08/22 0913  ? potassium chloride 10 mEq (03/08/22 1215)  ? promethazine (PHENERGAN) injection (IM or IVPB) 12.5 mg (03/08/22 1131)  ? ? ? LOS: 0 days  ? ? ? ? ? ? ?Jenne Sellinger B  Georgeann Oppenheim, MD ?[facility] ? ? ?If 7PM-7AM, please contact night-coverage ? ?03/08/2022, 1:12 PM   ?

## 2022-03-08 NOTE — Consult Note (Signed)
GI Inpatient Consult Note  Reason for Consult: Intractable nausea and vomiting   Attending Requesting Consult:  History of Present Illness: Tiffany [name] is a 61 y.o. female seen for evaluation of intractable nausea and vomiting and poor PO intake at the request of admitting hospitalist - Dr. Londell Moh. Patient has a PMH of obesity, HLD, COPD on 2L O2, asthma, Hx of stroke-like symptoms 10/2021, Bipolar disorder, migraines, and Hx of Nissen's fundoplication. She presented to the [facility] ED yesterday afternoon for chief complaint of intractable nausea and vomiting, dehydration, and poor PO intake. Patient is familiar to me as we were consulted for intractable nausea and vomiting last month during hospitalization for same chief complaint where EGD was performed by Dr. Norma Fredrickson which showed small non-bleeding distal esophageal diverticulum, small area of extrinsic compression involving the distal esophagus, evidence of previous Nissen's fundoplication with intact wrap, and patchy gastric erythema. Patient has continued to experience persistent nausea and vomiting with poor PO intake. She reports almost anything she puts in her mouth, solid or liquid, is coming immediately back up. This is her third hospitalization over the last month and she has been to the ED on multiple different occasions as well. Fiance and son at bedside corroborate patient's history. She reports she has lost over 100-lbs since November and attributes this basically to not being able to tolerate anything by mouth. She is completely bedridden and cannot walk around. Prior to her stroke-like symptoms in November 2022, she was doing great and was ambulatory without restrictions and without any nausea or vomiting. Fiance reports after one of her hospitalizations she was at the [facility] and they "dropped her three difference times." Upon presentation to the ED, vitals were within normal limits. Labs were significant for sodium 137,  potassium 2.9, chloride 93, bicarb 31, BUN <5, serum creatinine 0.5, WBC 5K, hemoglobin 8.4, and platelets 222K. UA negative for leukocytosis and nitrites. She received Reglan 10 mg IV, IV Zofran, IV Compazine, IV fluids, and electrolyte replacement in the ED. GI consulted for further evaluation and management.    Past Medical History:  Past Medical History:  Diagnosis Date   Asthma    Bipolar 1 disorder (HCC)    COPD (chronic obstructive pulmonary disease) (HCC)    CVA (cerebral vascular accident) (HCC)    Enlarged heart    GERD (gastroesophageal reflux disease)    Migraines    Sleep apnea     Problem List: Patient Active Problem List   Diagnosis Date Noted   Hypokalemia due to excessive gastrointestinal loss of potassium 03/07/2022   History of Nissen fundoplication 03/01/2022   UTI (urinary tract infection) 02/24/2022   Angioedema 02/21/2022   HLD (hyperlipidemia) 02/21/2022   COPD (chronic obstructive pulmonary disease) (HCC) 02/21/2022   Stroke-like symptom 02/21/2022   Migraine 02/21/2022   Obesity (BMI 30-39.9) 02/21/2022   Depression 02/21/2022   Homeless 02/21/2022   Inadequate oral intake 02/13/2022   History of esophagitis 02/12/2022   Hiatal hernia 02/12/2022   Obesity, Class III, BMI 40-49.9 (morbid obesity) (HCC) 02/11/2022   Pressure injury of skin 02/08/2022   Hypomagnesemia 02/08/2022   Hyponatremia 02/08/2022   Bipolar 1 disorder (HCC) 02/08/2022   Asthma, chronic 02/08/2022   History of migraine 02/08/2022   Dyslipidemia 02/08/2022   Intractable nausea and vomiting 02/07/2022   Hypoxia    Acute respiratory failure with hypoxia and hypercarbia (HCC)    Ambulatory dysfunction    Primary hypertension    Encephalopathy 11/13/2021  Hypokalemia 10/29/2021   Frequent falls 10/29/2021   Refractory nausea and vomiting 10/29/2021   Thrombocytopenia (HCC) 10/29/2021   GERD (gastroesophageal reflux disease) 10/29/2021    Past Surgical History: Past  Surgical History:  Procedure Laterality Date   ABDOMINAL HYSTERECTOMY     ABDOMINAL SURGERY     "For acid reflux"   CESAREAN SECTION     ESOPHAGOGASTRODUODENOSCOPY N/A 02/13/2022   Procedure: ESOPHAGOGASTRODUODENOSCOPY (EGD);  Surgeon: Toledo, Boykin Nearingeodoro K, MD;  Location: [facility] ENDOSCOPY;  Service: Gastroenterology;  Laterality: N/A;   OTHER SURGICAL HISTORY  01/09/1998   acid reflux, hole in stomach they closed    Allergies: Allergies  Allergen Reactions   Dairy Aid [Tilactase] Other (See Comments)    Exacerbates asthma   Egg [Eggs Or Egg-Derived Products] Other (See Comments)    Exacerbated asthma   Milk-Related Compounds     Home Medications: Medications Prior to Admission  Medication Sig Dispense Refill Last Dose   acetaminophen (TYLENOL) 325 MG tablet Take 2 tablets (650 mg total) by mouth every 6 (six) hours as needed for mild pain (or Fever >/= 101).      atorvastatin (LIPITOR) 80 MG tablet Take 80 mg by mouth daily.      calcium-vitamin D (OSCAL WITH D) 500-5 MG-MCG tablet Take 1 tablet by mouth daily with breakfast. 30 tablet 1    diclofenac Sodium (VOLTAREN) 1 % GEL Apply 4 g topically 4 (four) times daily.      diphenhydrAMINE (BENADRYL) 12.5 MG/5ML elixir Take 10 mLs (25 mg total) by mouth 4 (four) times daily as needed. 120 mL 0    DULoxetine (CYMBALTA) 30 MG capsule Take 30 mg by mouth daily.      famotidine (PEPCID) 20 MG tablet Take 1 tablet (20 mg total) by mouth 2 (two) times daily. 60 tablet 0    FLOVENT HFA 110 MCG/ACT inhaler Inhale 2 puffs into the lungs 2 (two) times daily.      folic acid (FOLVITE) 1 MG tablet Take 5 tablets (5 mg total) by mouth daily.      loratadine (CLARITIN) 10 MG tablet Take 1 tablet (10 mg total) by mouth every evening.      Multiple Vitamin (MULTIVITAMIN WITH MINERALS) TABS tablet Take 1 tablet by mouth daily.      Nutritional Supplements (FEEDING SUPPLEMENT, KATE FARMS STANDARD 1.4,) LIQD liquid Take 325 mLs by mouth 3 (three) times  daily between meals.      ondansetron (ZOFRAN-ODT) 4 MG disintegrating tablet Take 1 tablet (4 mg total) by mouth every 8 (eight) hours as needed for nausea or vomiting. 20 tablet 0    pantoprazole (PROTONIX) 40 MG tablet Take 40 mg by mouth 2 (two) times daily.      polyethylene glycol (MIRALAX / GLYCOLAX) 17 g packet Take 17 g by mouth 2 (two) times daily as needed.  0    SPIRIVA HANDIHALER 18 MCG inhalation capsule 1 capsule at bedtime.      traZODone (DESYREL) 100 MG tablet Take 1 tablet (100 mg total) by mouth at bedtime as needed for sleep.      Home medication reconciliation was completed with the patient.   Scheduled Inpatient Medications:    atorvastatin  80 mg Oral Daily   budesonide  0.25 mg Nebulization BID   DULoxetine  30 mg Oral Daily   enoxaparin (LOVENOX) injection  55 mg Subcutaneous QHS   famotidine  20 mg Oral BID   folic acid  5 mg Oral Daily  metoCLOPramide (REGLAN) injection  10 mg Intravenous Q8H   pantoprazole (PROTONIX) IV  40 mg Intravenous BID   prochlorperazine  10 mg Intravenous Once   tiotropium  18 mcg Inhalation QHS    Continuous Inpatient Infusions:    sodium chloride 100 mL/hr at 03/08/22 0913   potassium chloride 10 mEq (03/08/22 0914)   promethazine (PHENERGAN) injection (IM or IVPB)      PRN Inpatient Medications:  acetaminophen **OR** acetaminophen, ipratropium-albuterol, ondansetron **OR** ondansetron (ZOFRAN) IV, polyethylene glycol, promethazine (PHENERGAN) injection (IM or IVPB), traZODone  Family History: family history includes Hypertension in her mother.  The patient's family history is negative for inflammatory bowel disorders, GI malignancy, or solid organ transplantation.  Social History:   reports that she has never smoked. She has never used smokeless tobacco. She reports that she does not drink alcohol and does not use drugs. The patient denies ETOH, tobacco, or drug use.   Review of Systems: Constitutional: + weight  loss Eyes: No changes in vision. ENT: No oral lesions, sore throat.  GI: see HPI.  Heme/Lymph: No easy bruising.  CV: No chest pain.  GU: No hematuria.  Integumentary: No rashes.  Neuro: No headaches.  Psych: No depression/anxiety.  Endocrine: No heat/cold intolerance.  Allergic/Immunologic: No urticaria.  Resp: No cough, SOB.  Musculoskeletal: No joint swelling.    Physical Examination: BP 109/60 (BP Location: Right Arm)    Pulse 89    Temp 98.1 F (36.7 C) (Oral)    Resp 18    Ht 5\' 9"  (1.753 m)    Wt 113 kg    SpO2 94%    BMI 36.79 kg/m  Gen: NAD, alert and oriented x 4, fiance and son in room HEENT: PEERLA, EOMI, Neck: supple, no JVD or thyromegaly Chest: CTA bilaterally, no wheezes, crackles, or other adventitious sounds CV: RRR, no m/g/c/r Abd: soft, NT, ND, +BS in all four quadrants; no HSM, guarding, ridigity, or rebound tenderness Ext: no edema, well perfused with 2+ pulses, Skin: no rash or lesions noted Lymph: no LAD Neuro: reduced grip strength on left  Data: Lab Results  Component Value Date   WBC 4.9 03/08/2022   HGB 8.3 (L) 03/08/2022   HCT 24.3 (L) 03/08/2022   MCV 82.1 03/08/2022   PLT 191 03/08/2022   Recent Labs  Lab 03/07/22 1314 03/08/22 0513  HGB 8.4* 8.3*   Lab Results  Component Value Date   NA 137 03/08/2022   K 3.2 (L) 03/08/2022   CL 95 (L) 03/08/2022   CO2 29 03/08/2022   BUN <5 (L) 03/08/2022   CREATININE 0.42 (L) 03/08/2022   Lab Results  Component Value Date   ALT 13 03/07/2022   AST 17 03/07/2022   ALKPHOS 44 03/07/2022   BILITOT 1.2 03/07/2022   No results for input(s): APTT, INR, PTT in the last 168 hours.  Assessment/Plan:  61 y/o AA female with a PMH of obesity, HLD, GERD, Bipolar disorder, COPD on 2L O2, Hx of stroke-like symptoms 10/2021, migraines, and Hx of Nissen's fundoplication presented to the ED for intractable nausea and vomiting, dehydration, and poor PO intake. GI consulted for further evaluation and  management.   Persistent nausea and vomiting - unclear etiology, likely multifactorial. Possible underlying gastroparesis, post-viral syndrome, metabolic/endocrine causes, central nervous system diseases (migraines, infarction, seizures, pseudotumor cerebri, demyelinating disease, emotional response, psychiatric disease), complex hiatal hernia, rumination, etc. Multiple ED visits and hospitalizations over the last month without clear etiology. She had thorough GI evaluation last  month with EGD showing no evidence of significant reflux disease or erosive esophagitis, no evidence of gastric outlet obstruction, no malignancy. She has had multiple imaging modalities (RUQ [place], CT abd/pelvis, CTA chest, and CT soft tissue neck) which were unrevealing as potential cause of her persistent symptoms. No soft tissue mass seen on any cross-sectional imaging to explain the extrinsic compression seen on her EGD last month.   Hypokalemia 2/2 GI losses  Weight loss - 2/2 poor PO intake due to #1  Generalized weakness   Distal esophageal diverticulum - seen on EGD last month, unlikely to be contributing to overall symptoms  GERD  Cholelithiasis without evidence of cholecystitis   Recommendations:  - Continue scheduled Reglan 10 mg IV  - Continue supportive care with antiemetics as scheduled  - Bariatric diet as tolerated. Encouraged small, frequent meals. Avoid high-fat and high-fiber foods.  - Continue acid suppression therapy - Discussed case with Dr. Georgeann Oppenheim and reviewed work-up to date with EGD and multiple different imaging modalities - MRI brain scheduled for today - Follow-up on results of Hemoglobin A1C% to rule out uncontrolled diabetes mellitus - No plans for repeat endoscopy at this time given unlikely to change plan of care at this point in time - Continue to monitor clinically. Following along with you.   Thank you for the consult. Please call with questions or concerns.  Mickle Mallory [facility]enter[facility] [phone] (Cell)

## 2022-03-08 NOTE — TOC Initial Note (Signed)
Transition of Care (TOC) - Initial/Assessment Note  ? ? ?Patient Details  ?Name: Tiffany [name] ?MRN: [id number] ?Date of Birth: 29-Apr-1961 ? ?Transition of Care (TOC) CM/SW Contact:    ?Chapman Fitch, RN ?Phone Number: ?03/08/2022, 2:27 PM ? ?Clinical Narrative:                 ? ? ? Admitted for: Nausea and vomiting  ?Admitted from: [facility] with her significant other  ?PCP: Revonda Standard  ? ? ?Significant other at bed side.  Patient confirms plan to return to [facility] lodge at discharge. Will need EMS transport. No DME needed  ? ?  ? ? ?Patient Goals and CMS Choice ?  ?  ?  ? ?Expected Discharge Plan and Services ?  ?  ?  ?  ?  ?                ?  ?  ?  ?  ?  ?  ?  ?  ?  ?  ? ?Prior Living Arrangements/Services ?  ?  ?  ?       ?  ?  ?  ?  ? ?Activities of Daily Living ?Home Assistive Devices/Equipment: Eyeglasses, Oxygen ?ADL Screening (condition at time of admission) ?Patient's cognitive ability adequate to safely complete daily activities?: Yes ?Is the patient deaf or have difficulty hearing?: No ?Does the patient have difficulty seeing, even when wearing glasses/contacts?: No ?Does the patient have difficulty concentrating, remembering, or making decisions?: No ?Patient able to express need for assistance with ADLs?: Yes ?Does the patient have difficulty dressing or bathing?: Yes ?Independently performs ADLs?: No ?Communication: Independent ?Dressing (OT): Dependent ?Is this a change from baseline?: Pre-admission baseline ?Grooming: Dependent ?Is this a change from baseline?: Pre-admission baseline ?Feeding: Dependent ?Is this a change from baseline?: Pre-admission baseline ?Bathing: Dependent ?Is this a change from baseline?: Pre-admission baseline ?Toileting: Dependent ?Is this a change from baseline?: Pre-admission baseline ?In/Out Bed: Dependent ?Is this a change from baseline?: Pre-admission baseline ?Walks in Home: Dependent ?Is this a change from baseline?: Pre-admission baseline ?Does the patient have  difficulty walking or climbing stairs?: Yes ?Weakness of Legs: Both ?Weakness of Arms/Hands: Both ? ?Permission Sought/Granted ?  ?  ?   ?   ?   ?   ? ?Emotional Assessment ?  ?  ?  ?  ?  ?  ? ?Admission diagnosis:  Intractable nausea and vomiting [R11.2] ?Patient Active Problem List  ? Diagnosis Date Noted  ? Hypokalemia due to excessive gastrointestinal loss of potassium 03/07/2022  ? History of Nissen fundoplication 03/01/2022  ? UTI (urinary tract infection) 02/24/2022  ? Angioedema 02/21/2022  ? HLD (hyperlipidemia) 02/21/2022  ? COPD (chronic obstructive pulmonary disease) (HCC) 02/21/2022  ? Stroke-like symptom 02/21/2022  ? Migraine 02/21/2022  ? Obesity (BMI 30-39.9) 02/21/2022  ? Depression 02/21/2022  ? Homeless 02/21/2022  ? Inadequate oral intake 02/13/2022  ? History of esophagitis 02/12/2022  ? Hiatal hernia 02/12/2022  ? Obesity, Class III, BMI 40-49.9 (morbid obesity) (HCC) 02/11/2022  ? Pressure injury of skin 02/08/2022  ? Hypomagnesemia 02/08/2022  ? Hyponatremia 02/08/2022  ? Bipolar 1 disorder (HCC) 02/08/2022  ? Asthma, chronic 02/08/2022  ? History of migraine 02/08/2022  ? Dyslipidemia 02/08/2022  ? Intractable nausea and vomiting 02/07/2022  ? Hypoxia   ? Acute respiratory failure with hypoxia and hypercarbia (HCC)   ? Ambulatory dysfunction   ? Primary hypertension   ? Encephalopathy 11/13/2021  ? Hypokalemia 10/29/2021  ?  Frequent falls 10/29/2021  ? Refractory nausea and vomiting 10/29/2021  ? Thrombocytopenia (HCC) 10/29/2021  ? GERD (gastroesophageal reflux disease) 10/29/2021  ? ?PCP:  Eddie Dibbles, MD ?Pharmacy:   ?[facility] [address] [place] - [address] ?[id number] ?[place] [address] ?Phone: [phone] Fax: [phone] ? ?[facility] - [place] ([place]), [place] - [address] ?[address] ?[place] [address] ?Phone: [phone] Fax: [phone] ? ? ? ? ?Social Determinants of Health (SDOH) Interventions ?   ? ?Readmission Risk Interventions ?Readmission Risk Prevention Plan 11/08/2021  ?Transportation Screening Complete  ?PCP or Specialist Appt within 5-7 Days Complete  ?Home Care Screening Complete  ?Medication Review (RN CM) Complete  ?Some recent data might be hidden  ? ? ? ?

## 2022-03-08 NOTE — Progress Notes (Addendum)
Initial Nutrition Assessment ? ?DOCUMENTATION CODES:  ? ?Obesity unspecified ? ?INTERVENTION:  ? ?-30 ml Prosource Plus TID, each supplement provides 100 kcals and 15 grams protein ?-Boost Breeze po TID, each supplement provides 250 kcal and 9 grams of protein  ?-Downgrade diet to dysphagia 2 diet with thin liquids ?-MVI with minerals daily ?-500 mg vitamin C BID ?-220 mg zinc sulfate daily x 14 days ?-Feeding assistance with meals ? ?NUTRITION DIAGNOSIS:  ? ?Inadequate oral intake related to decreased appetite, nausea as evidenced by meal completion < 25%, per patient/family report. ? ?GOAL:  ? ?Patient will meet greater than or equal to 90% of their needs ? ?MONITOR:  ? ?PO intake, Supplement acceptance, Labs, Weight trends, Skin, I & O's ? ?REASON FOR ASSESSMENT:  ? ?Malnutrition Screening Tool ?  ? ?ASSESSMENT:  ? ?61 year female with history of gastritis, obesity, dyslipidemia, GERD, hyperlipidemia, frequent nausea and vomiting, hypokalemia, hiatal hernia, bipolar, who presents emergency department for chief concerns of nausea and vomiting. ? ?Pt admitted with intractable nausea and vomiting  ? ?Reviewed I/O's: +300 ml x 24 hours ? ?UOP: 500 ml x 24 hours ? ?Pt very lethargic at time of visit. She did not arouse to voice or touch. Fiance at bedside also asleep.  ? ?Case discussed with RN, who reports pt was just given phenergan, which is likely causing her to be lethargic. Per RN, pt is undergoing further testing to investigate her symptoms and is scheduled to go down for MRI due to history of stroke-like symptoms. Pt familiar to RD from recent admission last week.  Pt has experienced a general decline in health since November after suffering a stroke like synptoms. She was placed in a SNF for approximately 6 weeks, but has been living in a motel since late December 2022. Pt unable to take much solid food or medications down, as they "come right back up". Diet consists mainly of chopped spaghetti and chicken  broth. Per RN, pt has not been able to take anything orally today.  ? ?Reviewed wt hx; pt has experienced a 27% wt loss over the past 3 months, which is significant for time frame. Pt was been non-ambulatory since November 2022.  ? ?Pt is on a bariatric advanced diet, which is a diet designed for patient who recently underwent bariatric surgery. It has limited options. RD will downgrade to dysphagia 2 diet for ease of intake.  ?  ?Medications reviewed and include folic acid, ativan, reglan,and 0.9% sodium chloride infusion @ 10 ml/hr.  ? ?Labs reviewed: K: 3.2. Mg: 1.4.   ? ?NUTRITION - FOCUSED PHYSICAL EXAM: ? ?Flowsheet Row Most Recent Value  ?Orbital Region No depletion  ?Upper Arm Region Mild depletion  ?Thoracic and Lumbar Region No depletion  ?Buccal Region No depletion  ?Temple Region No depletion  ?Clavicle Bone Region No depletion  ?Clavicle and Acromion Bone Region No depletion  ?Scapular Bone Region No depletion  ?Dorsal Hand No depletion  ?Patellar Region No depletion  ?Anterior Thigh Region No depletion  ?Posterior Calf Region No depletion  ?Edema (RD Assessment) Mild  ?Hair Reviewed  ?Eyes Reviewed  ?Mouth Reviewed  ?Skin Reviewed  ?Nails Reviewed  ? ?  ? ? ?Diet Order:   ?Diet Order   ? ?       ?  DIET DYS 2 Room service appropriate? Yes; Fluid consistency: Thin  Diet effective now       ?  ? ?  ?  ? ?  ? ? ?  EDUCATION NEEDS:  ? ?Not appropriate for education at this time ? ?Skin:  Skin Integrity Issues:: Stage II ?Stage II: lt and rt buttocks ? ?Last BM:  Unknown ? ?Height:  ? ?Ht Readings from Last 1 Encounters:  ?03/07/22 5\' 9"  (1.753 m)  ? ? ?Weight:  ? ?Wt Readings from Last 1 Encounters:  ?03/07/22 113 kg  ? ? ?Ideal Body Weight:  65.9 kg ? ?BMI:  Body mass index is 36.79 kg/m?. ? ?Estimated Nutritional Needs:  ? ?Kcal:  1950-2150 ? ?Protein:  115-130 grams ? ?Fluid:  >1.9 L ? ? ? ?05/07/22, RD, LDN, CDCES ?Registered Dietitian II ?Certified Diabetes Care and Education Specialist ?Please refer  to [facility] for RD and/or RD on-call/weekend/after hours pager  ?

## 2022-03-09 LAB — BASIC METABOLIC PANEL
Anion gap: 10 (ref 5–15)
BUN: <5 mg/dL — ABNORMAL LOW (ref 6–20)
CO2: 28 mmol/L (ref 22–32)
Calcium: 7.7 mg/dL — ABNORMAL LOW (ref 8.9–10.3)
Chloride: 96 mmol/L — ABNORMAL LOW (ref 98–111)
Creatinine, Ser: 0.41 mg/dL — ABNORMAL LOW (ref 0.44–1.00)
GFR, Estimated: >60 mL/min (ref >60)
Glucose, Bld: 70 mg/dL (ref 70–99)
Potassium: 3 mmol/L — ABNORMAL LOW (ref 3.5–5.1)
Sodium: 134 mmol/L — ABNORMAL LOW (ref 135–145)

## 2022-03-09 LAB — HEMOGLOBIN A1C
Hgb A1c MFr Bld: 4.6 % — ABNORMAL LOW (ref 4.8–5.6)
Mean Plasma Glucose: 85 mg/dL

## 2022-03-09 LAB — MAGNESIUM: Magnesium: 1.6 mg/dL — ABNORMAL LOW (ref 1.7–2.4)

## 2022-03-09 MED ORDER — SODIUM CHLORIDE 0.9 % IV SOLN
3.0000 mg/kg | Freq: Three times a day (TID) | INTRAVENOUS | Status: DC
  Administered 2022-03-09 – 2022-03-13 (×12): 340 mg via INTRAVENOUS
  Filled 2022-03-09 (×16): qty 6.8

## 2022-03-09 MED ORDER — KCL IN DEXTROSE-NACL 20-5-0.9 MEQ/L-%-% IV SOLN
INTRAVENOUS | Status: DC
Start: 2022-03-09 — End: 2022-03-13
  Filled 2022-03-09 (×12): qty 1000

## 2022-03-09 MED ORDER — DIPHENHYDRAMINE HCL 50 MG/ML IJ SOLN
12.5000 mg | Freq: Once | INTRAMUSCULAR | Status: AC
  Administered 2022-03-09: 12.5 mg via INTRAVENOUS
  Filled 2022-03-09: qty 1

## 2022-03-09 NOTE — Evaluation (Signed)
Occupational Therapy Evaluation Patient Details Name: Tiffany [name] MRN: [id number] DOB: July 20, 1961 Today's Date: 03/09/2022   History of Present Illness Patient is a 61 year old female who reports to [facility] for intractable nausea and vomiting. PMH includes  gastritis, obesity, dyslipidemia, GERD, hyperlipidemia, frequent nausea and vomiting, hypokalemia, hiatal hernia, bipolar.   Clinical Impression   Pt was seen for OT evaluation this date. Prior to hospital admission, pt was living in a hotel with her spouse requiring MAX A for all bathing, dressing, toileting, and bed mobility. Pt near bed bound with several recent readmissions due to N/V. Pt/spouse endorse having been "dropped" several times at rehab in the past and refuse to consider it, requiring to return to hotel at discharge. Pt endorses feeling weaker than baseline, hasn't eaten, and endorses nausea throughout OT eval and co-tx with PT but does not vomit during session. RN aware and reports will provide her with additional meds for nausea after session. Pt denies pain. Agreeable to bed level bathing, declined EOB or OOB attempts, stating "maybe tomorrow." Pt requires MAX A for rolling (MAX A +2 for scooting up/repositioning),  MIN A to wash her face (pt requested spouse assist with washing her eyes), MIN A for bed level UB bathing, TOTAL A for LB bathing and pericare at bed level. Pt educated in role of OT and benefits of increasing participation in mobility and ADL towards overall health. Pt verbalized understanding but continued to decline additional EOB/OOB attempts. Pt appears somewhat near more recent baseline assist levels. Will place pt on trial of OT services to further assess rehab potential.    Recommendations for follow up therapy are one component of a multi-disciplinary discharge planning process, led by the attending physician.  Recommendations may be updated based on patient status, additional functional criteria and insurance  authorization.   Follow Up Recommendations  Skilled nursing-short term rehab (<3 hours/day)    Assistance Recommended at Discharge Frequent or constant Supervision/Assistance  Patient can return home with the following Assist for transportation;Assistance with cooking/housework;Help with stairs or ramp for entrance;Direct supervision/assist for medications management;A lot of help with bathing/dressing/bathroom;Two people to help with walking and/or transfers    Functional Status Assessment  Patient has had a recent decline in their functional status and/or demonstrates limited ability to make significant improvements in function in a reasonable and predictable amount of time  Equipment Recommendations  Hospital bed    Recommendations for Other Services       Precautions / Restrictions Precautions Precautions: Fall Restrictions Weight Bearing Restrictions: No      Mobility Bed Mobility Overal bed mobility: Needs Assistance Bed Mobility: Rolling Rolling: Max assist         General bed mobility comments: MAX A +1 for log rolling during bed level ADL, Max A +2 to scoot patient up in bed via chuck pad    Transfers                   General transfer comment: pt declined      Balance Overall balance assessment: Needs assistance Sitting-balance support: Bilateral upper extremity supported Sitting balance-Leahy Scale: Poor Sitting balance - Comments: for long sitting in bed, pt unable to maintain on her own                                   ADL either performed or assessed with clinical judgement   ADL Overall ADL's :  At baseline;Needs assistance/impaired                                       General ADL Comments: Pt required MIN A to wash her face provided by spouse, MIN A for bed level UB bathing, TOTAL A for LB bathing and pericare at bed level     Vision         Perception     Praxis      Pertinent Vitals/Pain Pain  Assessment Pain Assessment: No/denies pain     Hand Dominance Right   Extremity/Trunk Assessment Upper Extremity Assessment Upper Extremity Assessment: Generalized weakness (RUE>LUE, at least 2+/5 strength)   Lower Extremity Assessment Lower Extremity Assessment: Generalized weakness (LLE>RLE at least 2+/5 strength, patient able to demonstrate increased knee/hip flexion in supine on RLE compared to LLE)       Communication Communication Communication: No difficulties   Cognition Arousal/Alertness: Awake/alert Behavior During Therapy: Flat affect Overall Cognitive Status: Within Functional Limits for tasks assessed                                 General Comments: requires encouragement to participate     General Comments       Exercises Other Exercises Other Exercises: Pt instructed in role of OT and benefits of bed mobility and increased participation in ADL to support increased strength/tolerance for ADL   Shoulder Instructions      Home Living Family/patient expects to be discharged to:: Other (Comment) Lynnell Catalan) Living Arrangements: Spouse/significant other Available Help at Discharge: Family;Available 24 hours/day Type of Home: Other(Comment) (Motel) Home Access: Level entry     Home Layout: One level     Bathroom Shower/Tub: Chief Strategy Officer: Standard     Home Equipment: Tub bench   Additional Comments: Lives with her sig other (Eddie) in a first floor motel room      Prior Functioning/Environment Prior Level of Function : Needs assist;History of Falls (last six months)       Physical Assist : ADLs (physical);Mobility (physical) Mobility (physical): Bed mobility;Transfers;Gait ADLs (physical): Grooming;Bathing;Dressing;Toileting;IADLs Mobility Comments: significant falls hisory (at least 4), spouse reports pt has been bed bound since admission on Nov 22. Pt now has fear of falling. ADLs Comments: spouse reports  heavy assist for all ADL at bed level since admission in Nov 22.        OT Problem List: Decreased strength;Obesity;Impaired balance (sitting and/or standing);Decreased activity tolerance;Decreased knowledge of use of DME or AE;Impaired UE functional use      OT Treatment/Interventions: Self-care/ADL training;Therapeutic exercise;Therapeutic activities;DME and/or AE instruction;Patient/family education;Balance training    OT Goals(Current goals can be found in the care plan section) Acute Rehab OT Goals Patient Stated Goal: go home OT Goal Formulation: With patient/family Time For Goal Achievement: 03/23/22 Potential to Achieve Goals: Fair ADL Goals Pt Will Perform Upper Body Dressing: sitting;with min assist Pt/caregiver will Perform Home Exercise Program: Increased strength;Both right and left upper extremity;With written HEP provided;With Supervision Additional ADL Goal #1: Pt will perform bed mobility with MOD A in anticipation of ADL and to support repositioning to minimize pressure injury/skin breakdown.  OT Frequency: Min 1X/week    Co-evaluation PT/OT/SLP Co-Evaluation/Treatment: Yes Reason for Co-Treatment: For patient/therapist safety;To address functional/ADL transfers;Complexity of the patient's impairments (multi-system involvement) PT goals addressed during session: Mobility/safety  with mobility OT goals addressed during session: ADL's and self-care      AM-PAC OT "6 Clicks" Daily Activity     Outcome Measure Help from another person eating meals?: A Little Help from another person taking care of personal grooming?: A Little Help from another person toileting, which includes using toliet, bedpan, or urinal?: Total Help from another person bathing (including washing, rinsing, drying)?: A Lot Help from another person to put on and taking off regular upper body clothing?: A Lot Help from another person to put on and taking off regular lower body clothing?: Total 6 Click  Score: 12   End of Session Nurse Communication: Mobility status  Activity Tolerance: Patient tolerated treatment well Patient left: in bed;with call bell/phone within reach;with bed alarm set;with family/visitor present  OT Visit Diagnosis: Other abnormalities of gait and mobility (R26.89);Repeated falls (R29.6);Muscle weakness (generalized) (M62.81)                Time: [id number] OT Time Calculation (min): 36 min Charges:  OT General Charges $OT Visit: 1 Visit OT Evaluation $OT Eval Moderate Complexity: 1 Mod OT Treatments $Self Care/Home Management : 8-22 mins  Arman FilterJamie R., MPH, MS, OTR/L ascom [phone] 03/09/22, 2:10 PM

## 2022-03-09 NOTE — TOC Progression Note (Signed)
Transition of Care (TOC) - Progression Note  ? ? ?Patient Details  ?Name: Tiffany [name] ?MRN: [id number] ?Date of Birth: 1961-09-05 ? ?Transition of Care (TOC) CM/SW Contact  ?Chapman Fitch, RN ?Phone Number: ?03/09/2022, 1:40 PM ? ?Clinical Narrative:    ? ?PT recommending SNF.  Confirmed with patient she does not wish for SNF and plans is to return to [facility] at discharge  ? ?  ?  ? ?Expected Discharge Plan and Services ?  ?  ?  ?  ?  ?                ?  ?  ?  ?  ?  ?  ?  ?  ?  ?  ? ? ?Social Determinants of Health (SDOH) Interventions ?  ? ?Readmission Risk Interventions ?Readmission Risk Prevention Plan 11/08/2021  ?Transportation Screening Complete  ?PCP or Specialist Appt within 5-7 Days Complete  ?Home Care Screening Complete  ?Medication Review (RN CM) Complete  ?Some recent data might be hidden  ? ? ?

## 2022-03-09 NOTE — Progress Notes (Signed)
PROGRESS NOTE    Tiffany [name]  [id number] DOB: 14-Dec-1961 DOA: 03/07/2022 PCP: Eddie DibblesAllison, James H, MD    Brief Narrative:  [age] year female with history of gastritis, obesity, dyslipidemia, GERD, hyperlipidemia, frequent nausea and vomiting, hypokalemia, hiatal hernia, bipolar, who presents emergency department for chief concerns of nausea and vomiting.  Patient has had numerous readmissions for similar complaints.  The exact etiology of her nausea and vomiting has not been identified.  Patient had recent EGD and extensive CT imaging survey without identifiable cause.  She has been assigned a diagnosis of gastroparesis but does not have a history of diabetes.  GI consulted, case discussed at length with GI service PA.  No plans for repeat endoscopic evaluation as it is unlikely to change management.  Patient has limited response to IV Reglan.  Still with nausea and poor p.o. intake.  Discussed with GI.  No evidence of CVA on MRI.  Likely functional paraplegia secondary to malnourished state.  We will attempt IV erythromycin for gastroparesis treatment.   Assessment & Plan:   Principal Problem:   Intractable nausea and vomiting Active Problems:   GERD (gastroesophageal reflux disease)   Primary hypertension   Bipolar 1 disorder (HCC)   Asthma, chronic   Dyslipidemia   HLD (hyperlipidemia)   COPD (chronic obstructive pulmonary disease) (HCC)   Migraine   Depression   Hypokalemia due to excessive gastrointestinal loss of potassium  * Intractable nausea and vomiting Etiology is unclear Multiple admissions for the same Upper EGD on 2/14.  Unclear nature of extrinsic compression of the distal esophagus Recent CT chest and CT neck does not demonstrate any soft tissue mass or other etiology for external compression GI consulted Patient has a diagnosis of gastroparesis but unclear etiology of this Limited/nonresponse to Reglan Plan: DC Reglan Start erythromycin 3 mg/kg every 8  hours IV Continue IVF Continue gastroparesis diet  History of CVA Functional paraplegia Discussed with GI Patient has left-sided weakness Per family at bedside she has been unable to ambulate No evidence of CVA on MRI Suspect functional paraplegia secondary to malnourished state Plan: Therapy evaluations    COPD (chronic obstructive pulmonary disease) (HCC) No evidence of acute exacerbation Plan: Resume home inhalers DuoNebs 3 times daily Supplemental oxygen as needed   GERD (gastroesophageal reflux disease) Twice daily PPI and H2 blocker  Hypokalemia due to excessive gastrointestinal loss of potassium Presumed secondary to GI loss Monitor and replace as necessary  Depression Assessment & Plan - Resumed home duloxetine 30 mg daily - Resume trazodone 100 mg nightly   HLD (hyperlipidemia) Assessment & Plan - Atorvastatin 80 mg nightly resumed   Dyslipidemia Assessment & Plan - Atorvastatin 80 mg daily resumed     DVT prophylaxis: SQ Lovenox Code Status: Full Family Communication: Spouse at bedside 3/9, 3/10 Disposition Plan: Status is: Inpatient Remains inpatient appropriate because: Intractable nausea and vomiting requiring IV antiemetics.  Inability to tolerate p.o. suspect underlying gastroparesis    Level of care: Med-Surg  Consultants:  GI  Procedures:  None  Antimicrobials: None   Subjective: Patient seen and examined.  Continues to report weakness and decreased p.o. tolerance  Objective: Vitals:   03/08/22 1600 03/08/22 1952 03/08/22 2059 03/09/22 0518  BP: 106/60 (!) 104/59  130/66  Pulse: 89 98  81  Resp: 18 18  18   Temp: 97.9 F (36.6 C) 99 F (37.2 C)  98.5 F (36.9 C)  TempSrc: Oral     SpO2: 95% 96% 97% 99%  Weight:  Height:        Intake/Output Summary (Last 24 hours) at 03/09/2022 1012 Last data filed at 03/09/2022 0500 Gross per 24 hour  Intake 1028.42 ml  Output 2100 ml  Net -1071.58 ml   Filed Weights    03/07/22 1306  Weight: 113 kg    Examination:  General exam: No acute distress. Respiratory system: Clear to auscultation. Respiratory effort normal. Cardiovascular system: S1-S2, RRR, no murmurs, no pedal edema Gastrointestinal system: Obese, NT/ND, normal bowel sounds Central nervous system: Alert and oriented. No focal neurological deficits. Extremities: Left-sided weakness Skin: No rashes, lesions or ulcers Psychiatry: Judgement and insight appear impaired. Mood & affect flattened.     Data Reviewed: I have personally reviewed following labs and imaging studies  CBC: Recent Labs  Lab 03/07/22 1314 03/08/22 0513  WBC 5.0 4.9  NEUTROABS 2.4  --   HGB 8.4* 8.3*  HCT 25.4* 24.3*  MCV 83.0 82.1  PLT 222 191   Basic Metabolic Panel: Recent Labs  Lab 03/07/22 1314 03/07/22 1332 03/07/22 2008 03/08/22 0513 03/09/22 0855  NA 137  --   --  137 134*  K 2.9*  --   --  3.2* 3.0*  CL 93*  --   --  95* 96*  CO2 31  --   --  29 28  GLUCOSE 73  --   --  68* 70  BUN <5*  --   --  <5* <5*  CREATININE 0.50  --   --  0.42* 0.41*  CALCIUM 8.3*  --   --  8.3* 7.7*  MG  --  2.0  --  1.4*  --   PHOS  --   --  2.6  --   --    GFR: Estimated Creatinine Clearance: 101.5 mL/min (A) (by C-G formula based on SCr of 0.41 mg/dL (L)). Liver Function Tests: Recent Labs  Lab 03/07/22 1314  AST 17  ALT 13  ALKPHOS 44  BILITOT 1.2  PROT 5.5*  ALBUMIN 2.5*   No results for input(s): LIPASE, AMYLASE in the last 168 hours. No results for input(s): AMMONIA in the last 168 hours. Coagulation Profile: No results for input(s): INR, PROTIME in the last 168 hours. Cardiac Enzymes: No results for input(s): CKTOTAL, CKMB, CKMBINDEX, TROPONINI in the last 168 hours. BNP (last 3 results) No results for input(s): PROBNP in the last 8760 hours. HbA1C: No results for input(s): HGBA1C in the last 72 hours. CBG: No results for input(s): GLUCAP in the last 168 hours. Lipid Profile: No results  for input(s): CHOL, HDL, LDLCALC, TRIG, CHOLHDL, LDLDIRECT in the last 72 hours. Thyroid Function Tests: No results for input(s): TSH, T4TOTAL, FREET4, T3FREE, THYROIDAB in the last 72 hours. Anemia Panel: No results for input(s): VITAMINB12, FOLATE, FERRITIN, TIBC, IRON, RETICCTPCT in the last 72 hours. Sepsis Labs: Recent Labs  Lab 03/07/22 1314  LATICACIDVEN 0.9    No results found for this or any previous visit (from the past 240 hour(s)).       Radiology Studies: MR BRAIN W WO CONTRAST  Result Date: 03/08/2022 CLINICAL DATA:  Neuro deficit, acute, stroke suspected. EXAM: MRI HEAD WITHOUT AND WITH CONTRAST TECHNIQUE: Multiplanar, multiecho pulse sequences of the brain and surrounding structures were obtained without and with intravenous contrast. CONTRAST:  10mL GADAVIST GADOBUTROL 1 MMOL/ML IV SOLN COMPARISON:  Prior head CT examinations 11/13/2021 and earlier. FINDINGS: Brain: Cerebral volume appears within normal limits for age. Redemonstrated "empty" and somewhat expanded sella turcica. No significant  cerebral white matter disease for age. No cortical encephalomalacia is identified. There is no acute infarct. No evidence of an intracranial mass. No chronic intracranial blood products. No extra-axial fluid collection. No midline shift. No pathologic intracranial enhancement identified. Vascular: Maintained flow voids within the proximal large arterial vessels. 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 Skull and upper cervical spine: No focal suspicious marrow lesion Sinuses/Orbits: Bilateral proptosis. Trace mucosal thickening within the bilateral ethmoid sinuses. Other: Small-volume fluid within the bilateral mastoid air cells. IMPRESSION: No evidence of acute intracranial abnormality. Redemonstrated "empty" and somewhat expanded sella turcica. While these findings can reflect incidental anatomic variation, they can also be associated with idiopathic  intracranial hypertension (pseudotumor cerebri). Otherwise unremarkable MRI appearance of the brain for age. Bilateral proptosis. Small-volume fluid within the bilateral mastoid air cells. Electronically Signed   By: Jackey Loge D.O.   On: 03/08/2022 14:46        Scheduled Meds:  (feeding supplement) PROSource Plus  30 mL Oral TID BM   vitamin C  500 mg Oral BID   atorvastatin  80 mg Oral Daily   budesonide  0.25 mg Nebulization BID   DULoxetine  30 mg Oral Daily   enoxaparin (LOVENOX) injection  55 mg Subcutaneous QHS   famotidine  20 mg Oral BID   feeding supplement  1 Container Oral TID BM   folic acid  5 mg Oral Daily   LORazepam  0.5 mg Intravenous Once   multivitamin with minerals  1 tablet Oral Daily   pantoprazole (PROTONIX) IV  40 mg Intravenous BID   prochlorperazine  10 mg Intravenous Once   tiotropium  18 mcg Inhalation QHS   zinc sulfate  220 mg Oral Daily   Continuous Infusions:  sodium chloride 100 mL/hr at 03/09/22 0333   erythromycin     promethazine (PHENERGAN) injection (IM or IVPB) Stopped (03/08/22 1146)     LOS: 1 day        Tresa Moore, MD [facility]   If 7PM-7AM, please contact night-coverage  03/09/2022, 10:12 AM

## 2022-03-09 NOTE — Progress Notes (Signed)
? ?GI Inpatient Follow-up Note ? ?Subjective: ? ?Patient seen in follow-up for persistent nausea and vomiting. No acute events overnight. No vomiting this morning, but patient still endorses persistent nausea. No abdominal pain. No diarrhea or rectal bleeding. She is tolerating water and ice chips. Her brother, Hinton Dyer, in room this afternoon with patient. Reglan not of any benefit per patient.  ? ?Scheduled Inpatient Medications:  ? (feeding supplement) PROSource Plus  30 mL Oral TID BM  ? vitamin C  500 mg Oral BID  ? atorvastatin  80 mg Oral Daily  ? budesonide  0.25 mg Nebulization BID  ? DULoxetine  30 mg Oral Daily  ? enoxaparin (LOVENOX) injection  55 mg Subcutaneous QHS  ? famotidine  20 mg Oral BID  ? feeding supplement  1 Container Oral TID BM  ? folic acid  5 mg Oral Daily  ? LORazepam  0.5 mg Intravenous Once  ? multivitamin with minerals  1 tablet Oral Daily  ? pantoprazole (PROTONIX) IV  40 mg Intravenous BID  ? prochlorperazine  10 mg Intravenous Once  ? tiotropium  18 mcg Inhalation QHS  ? zinc sulfate  220 mg Oral Daily  ? ? ?Continuous Inpatient Infusions: ?  ? dextrose 5 % and 0.9 % NaCl with KCl 20 mEq/L    ? erythromycin 340 mg (03/09/22 1225)  ? promethazine (PHENERGAN) injection (IM or IVPB) Stopped (03/08/22 1146)  ? ? ?PRN Inpatient Medications:  ?acetaminophen **OR** acetaminophen, ipratropium-albuterol, ondansetron **OR** ondansetron (ZOFRAN) IV, polyethylene glycol, promethazine (PHENERGAN) injection (IM or IVPB), traZODone ? ?Review of Systems: ?Constitutional: Weight is stable.  ?Eyes: No changes in vision. ?ENT: No oral lesions, sore throat.  ?GI: see HPI.  ?Heme/Lymph: No easy bruising.  ?CV: No chest pain.  ?GU: No hematuria.  ?Integumentary: No rashes.  ?Neuro: No headaches.  ?Psych: No depression/anxiety.  ?Endocrine: No heat/cold intolerance.  ?Allergic/Immunologic: No urticaria.  ?Resp: No cough, SOB.  ?Musculoskeletal: No joint swelling.  ?  ?Physical Examination: ?BP 125/81  (BP Location: Right Arm)   Pulse 90   Temp 98.4 ?F (36.9 ?C) (Oral)   Resp 16   Ht 5\' 9"  (1.753 m)   Wt 113 kg   SpO2 97%   BMI 36.79 kg/m?  ?Gen: NAD, alert and oriented x 4 ?HEENT: PEERLA, EOMI, ?Neck: supple, no JVD or thyromegaly ?Chest: CTA bilaterally, no wheezes, crackles, or other adventitious sounds ?CV: RRR, no m/g/c/r ?Abd: soft, NT, ND, +BS in all four quadrants; no HSM, guarding, ridigity, or rebound tenderness ?Ext: no edema, well perfused with 2+ pulses, ?Skin: no rash or lesions noted ?Lymph: no LAD ? ?Data: ?Lab Results  ?Component Value Date  ? WBC 4.9 03/08/2022  ? HGB 8.3 (L) 03/08/2022  ? HCT 24.3 (L) 03/08/2022  ? MCV 82.1 03/08/2022  ? PLT 191 03/08/2022  ? ?Recent Labs  ?Lab 03/07/22 ?1314 03/08/22 ?05/08/22  ?HGB 8.4* 8.3*  ? ?Lab Results  ?Component Value Date  ? NA 134 (L) 03/09/2022  ? K 3.0 (L) 03/09/2022  ? CL 96 (L) 03/09/2022  ? CO2 28 03/09/2022  ? BUN <5 (L) 03/09/2022  ? CREATININE 0.41 (L) 03/09/2022  ? ?Lab Results  ?Component Value Date  ? ALT 13 03/07/2022  ? AST 17 03/07/2022  ? ALKPHOS 44 03/07/2022  ? BILITOT 1.2 03/07/2022  ? ?No results for input(s): APTT, INR, PTT in the last 168 hours. ? ?MRI brain w/wo contrast 03/08/22: ?IMPRESSION: ?No evidence of acute intracranial abnormality. ?  ?Redemonstrated "empty"  and somewhat expanded sella turcica. While ?these findings can reflect incidental anatomic variation, they can ?also be associated with idiopathic intracranial hypertension ?(pseudotumor cerebri). ?  ?Otherwise unremarkable MRI appearance of the brain for age. ?  ?Bilateral proptosis. ?  ?Small-volume fluid within the bilateral mastoid air cells. ? ? ?Assessment/Plan: ? ?61 y/o AA female with a PMH of obesity, HLD, GERD, Bipolar disorder, COPD on 2L O2, Hx of stroke-like symptoms 10/2021, migraines, and Hx of Nissen's fundoplication presented to the ED for intractable nausea and vomiting, dehydration, and poor PO intake. GI consulted for further evaluation and  management.  ?  ?Persistent nausea and vomiting - unclear etiology, likely multifactorial. Possible underlying gastroparesis, post-viral syndrome, metabolic/endocrine causes, central nervous system diseases (migraines, infarction, seizures, pseudotumor cerebri, demyelinating disease, emotional response, psychiatric disease), complex hiatal hernia, rumination, etc. Multiple ED visits and hospitalizations over the last month without clear etiology. She had thorough GI evaluation last month with EGD showing no evidence of significant reflux disease or erosive esophagitis, no evidence of gastric outlet obstruction, no malignancy. She has had multiple imaging modalities (RUQ [place], CT abd/pelvis, CTA chest, and CT soft tissue neck) which were unrevealing as potential cause of her persistent symptoms. No soft tissue mass seen on any cross-sectional imaging to explain the extrinsic compression seen on her EGD last month. MRI yesterday negative for acute stroke, but cannot rule out possibility of pseudotumor cerebri.  ?  ?Hypokalemia 2/2 GI losses ?  ?Weight loss - 2/2 poor PO intake due to #1 ?  ?Generalized weakness  ?  ?Distal esophageal diverticulum - seen on EGD last month, unlikely to be contributing to overall symptoms ?  ?GERD ?  ?Cholelithiasis without evidence of cholecystitis  ? ?Recommendations: ?  ?- D/C Reglan since this has been ineffective ?- Agree with trying IV erythromycin 3 mg/kg q8h  ?- Continue IV fluid hydration ?- Continue gastroparesis diet ?- Suspect functional paraplegia 2/2 malnourished state given relatively unremarkable structural evaluation with multiple scans ?- Agree with therapy evaluations ?- Defer work-up of pseudotumor cerebri to primary team ?- No plans for repeat EGD at this time ?- GI will sign off at this time and follow peripherally. Dr. Allegra Lai will be on this weekend if there are concerns or questions.  ?  ? ?Please call with questions or concerns. ? ? ? ?Jacob Moores, PA-C ?[facility] ?[phone] ?[phone] (Cell) ? ? ? ?

## 2022-03-09 NOTE — TOC Progression Note (Signed)
Transition of Care (TOC) - Progression Note  ? ? ?Patient Details  ?Name: Tiffany [name] ?MRN: [id number] ?Date of Birth: September 30, 1961 ? ?Transition of Care (TOC) CM/SW Contact  ?Chapman Fitch, RN ?Phone Number: ?03/09/2022, 10:46 AM ? ?Clinical Narrative:    ?Confirmed patient no longer active with [facility] home health ?There is not a skilled need to pursue home health services at discharge.  No RN need, and patient mobility at baseline  ? ? ?  ?  ? ?Expected Discharge Plan and Services ?  ?  ?  ?  ?  ?                ?  ?  ?  ?  ?  ?  ?  ?  ?  ?  ? ? ?Social Determinants of Health (SDOH) Interventions ?  ? ?Readmission Risk Interventions ?Readmission Risk Prevention Plan 11/08/2021  ?Transportation Screening Complete  ?PCP or Specialist Appt within 5-7 Days Complete  ?Home Care Screening Complete  ?Medication Review (RN CM) Complete  ?Some recent data might be hidden  ? ? ?

## 2022-03-09 NOTE — Evaluation (Signed)
Physical Therapy Evaluation Patient Details Name: SHAMONIQUE BATTISTE MRN: [id number] DOB: 30-May-1961 Today's Date: 03/09/2022  History of Present Illness  Patient is a 61 year old female who [facility] to [facility] for intractable nausea and vomiting. PMH includes  gastritis, obesity, dyslipidemia, GERD, hyperlipidemia, frequent nausea and vomiting, hypokalemia, hiatal hernia, bipolar.   Clinical Impression  PT/OT co-evaluation completed on this date. No pain reported throughout session, however patient was complaining about increased weakness in RUE due to sleeping on it wrong. Patient has had multiple admissions for same diagnosis, and appears to be near her baseline. Per chart review from previous admission, patient lives in a entry level motel room [facility]) with her significant other Eddie. Patient has been bed-bound since ~November 2022.   Upon entry into room, patient was supine in bed with significant other present, already working with OT. Patient required Max A at trunk and hips for rolling bilaterally, with cueing for patient to reach across body to hold onto bed rail. OT provided Max A for bathing while author held patient (Max A) in R or L sidelying. Patient also required Max A +2 to scoot patient up in bed via chuck pad. Patient demonstrated generalized weakness in BLEs (LLE>RLE) as well as decreased ROM. Strength at least 2+/5 bilaterally. Author provided max encouragement to get patient to sit EOB, however patient refused any out of bed/ EOB activity. Patient reported "I do not feel like it, I will try next time". Patient left in bed with husband present, and all needs met. Recommend STR upon discharge from acute hospitalization, however patient has a hx of declining STR. If declined would recommend HHPT to work on BLE strengthening to decrease caregiver burden with bed mobility.     Recommendations for follow up therapy are one component of a multi-disciplinary discharge planning process, led  by the attending physician.  Recommendations may be updated based on patient status, additional functional criteria and insurance authorization.  Follow Up Recommendations Skilled nursing-short term rehab (<3 hours/day)    Assistance Recommended at Discharge Frequent or constant Supervision/Assistance  Patient can return home with the following  Two people to help with walking and/or transfers;Two people to help with bathing/dressing/bathroom;Assistance with cooking/housework;Direct supervision/assist for medications management;Assist for transportation;Help with stairs or ramp for entrance;Direct supervision/assist for financial management    Equipment Recommendations Other (comment) (defer to next level of care)  Recommendations for Other Services       Functional Status Assessment Patient has had a recent decline in their functional status and demonstrates the ability to make significant improvements in function in a reasonable and predictable amount of time.     Precautions / Restrictions Precautions Precautions: Fall Restrictions Weight Bearing Restrictions: No      Mobility  Bed Mobility Overal bed mobility: Needs Assistance Bed Mobility: Rolling Rolling: Max assist (patient able to demonstrate ability to reach across body to grab hand rails to assist with cueing, required Max A at trunk and hips to complete movement, patient rolled L and R 1 time)   Supine to sit:  (patient refused sitting EOB depite max encouragment- patient reported she does not feel like it and will try next session) Sit to supine:  (patient refused sitting EOB depite max encouragment- patient reported she does not feel like it and will try next session)   General bed mobility comments: Max A +2 to scoot patient up in bed via chuck pad Patient Response: Flat affect  Transfers Overall transfer level:  (patient refused)  Ambulation/Gait Ambulation/Gait assistance:   (patient refused)                Stairs            Wheelchair Mobility    Modified Rankin (Stroke Patients Only)       Balance Overall balance assessment: Needs assistance (patient refused sitting EOB, required Max A to pull up into long sitting, unable to hold herself up independently) Sitting-balance support: Bilateral upper extremity supported Sitting balance-Leahy Scale: Poor Sitting balance - Comments: for long sitting in bed                                     Pertinent Vitals/Pain Pain Assessment Pain Assessment: No/denies pain Faces Pain Scale: Hurts a little bit Pain Intervention(s): Monitored during session    Home Living Family/patient expects to be discharged to:: Other (Comment) [facility]) Living Arrangements: Spouse/significant other Available Help at Discharge: Family;Available 24 hours/day Type of Home: Other(Comment) (Motel) Home Access: Level entry       Home Layout: One level Home Equipment: Tub bench Additional Comments: Lives with her sig other (Eddie) in a first floor motel room    Prior Function Prior Level of Function : Needs assist;History of Falls (last six months)       Physical Assist : ADLs (physical);Mobility (physical) Mobility (physical): Bed mobility;Transfers;Gait ADLs (physical): Grooming;Bathing;Dressing;Toileting;IADLs Mobility Comments: significant falls hisory (at least 4), spouse reports pt has been bed bound since admission on Nov 22. Pt now has fear of falling. ADLs Comments: spouse reports heavy assist for all ADL at bed level since recent admission in Nov 22.     Hand Dominance   Dominant Hand: Right    Extremity/Trunk Assessment   Upper Extremity Assessment Upper Extremity Assessment: Generalized weakness (RUE>LUE, at least 2+/5 strength)    Lower Extremity Assessment Lower Extremity Assessment: Generalized weakness (LLE>RLE at least 2+/5 strength, patient able to demonstrate  increased knee/hip flexion in supine on RLE compared to LLE)       Communication   Communication: No difficulties  Cognition Arousal/Alertness: Awake/alert, Lethargic Behavior During Therapy: Flat affect Overall Cognitive Status: Within Functional Limits for tasks assessed                                          General Comments      Exercises Other Exercises Other Exercises: patient educated on role of PT in acute setting   Assessment/Plan    PT Assessment Patient needs continued PT services  PT Problem List Decreased strength;Decreased mobility;Decreased range of motion;Decreased activity tolerance;Decreased balance       PT Treatment Interventions Therapeutic activities;Therapeutic exercise;Balance training;Functional mobility training;Patient/family education    PT Goals (Current goals can be found in the Care Plan section)  Acute Rehab PT Goals Patient Stated Goal: family wants patient to be able to walk, patient and family want her to return home PT Goal Formulation: With patient Time For Goal Achievement: 02/23/22 Potential to Achieve Goals: Fair    Frequency Min 2X/week     Co-evaluation PT/OT/SLP Co-Evaluation/Treatment: Yes Reason for Co-Treatment: For patient/therapist safety;To address functional/ADL transfers;Complexity of the patient's impairments (multi-system involvement) PT goals addressed during session: Mobility/safety with mobility OT goals addressed during session: ADL's and self-care       AM-PAC PT "6 Clicks" Mobility  Outcome Measure Help needed turning from your back to your side while in a flat bed without using bedrails?: A Lot Help needed moving from lying on your back to sitting on the side of a flat bed without using bedrails?: Total Help needed moving to and from a bed to a chair (including a wheelchair)?: Total Help needed standing up from a chair using your arms (e.g., wheelchair or bedside chair)?: Total Help  needed to walk in hospital room?: Total Help needed climbing 3-5 steps with a railing? : Total 6 Click Score: 7    End of Session   Activity Tolerance: Patient limited by lethargy Patient left: in bed;with call bell/phone within reach;with bed alarm set;with family/visitor present Nurse Communication: Mobility status PT Visit Diagnosis: History of falling (Z91.81);Muscle weakness (generalized) (M62.81)    Time: 7445-1460 PT Time Calculation (min) (ACUTE ONLY): 15 min   Charges:   PT Evaluation $PT Eval Low Complexity: 1 Low          Iva Boop, PT  03/09/22. 11:20 AM

## 2022-03-10 LAB — CBC WITH DIFFERENTIAL/PLATELET
Abs Immature Granulocytes: 0.03 K/uL (ref 0.00–0.07)
Basophils Absolute: 0 K/uL (ref 0.0–0.1)
Basophils Relative: 1 %
Eosinophils Absolute: 0.1 K/uL (ref 0.0–0.5)
Eosinophils Relative: 1 %
HCT: 23.7 % — ABNORMAL LOW (ref 36.0–46.0)
Hemoglobin: 7.9 g/dL — ABNORMAL LOW (ref 12.0–15.0)
Immature Granulocytes: 1 %
Lymphocytes Relative: 37 %
Lymphs Abs: 1.5 K/uL (ref 0.7–4.0)
MCH: 27.1 pg (ref 26.0–34.0)
MCHC: 33.3 g/dL (ref 30.0–36.0)
MCV: 81.4 fL (ref 80.0–100.0)
Monocytes Absolute: 0.5 K/uL (ref 0.1–1.0)
Monocytes Relative: 11 %
Neutro Abs: 2 K/uL (ref 1.7–7.7)
Neutrophils Relative %: 49 %
Platelets: 188 K/uL (ref 150–400)
RBC: 2.91 MIL/uL — ABNORMAL LOW (ref 3.87–5.11)
RDW: 15.6 % — ABNORMAL HIGH (ref 11.5–15.5)
WBC: 4 K/uL (ref 4.0–10.5)
nRBC: 0.7 % — ABNORMAL HIGH (ref 0.0–0.2)

## 2022-03-10 LAB — BASIC METABOLIC PANEL WITH GFR
Anion gap: 7 (ref 5–15)
BUN: <5 mg/dL — ABNORMAL LOW (ref 6–20)
CO2: 30 mmol/L (ref 22–32)
Calcium: 7.9 mg/dL — ABNORMAL LOW (ref 8.9–10.3)
Chloride: 97 mmol/L — ABNORMAL LOW (ref 98–111)
Creatinine, Ser: 0.6 mg/dL (ref 0.44–1.00)
GFR, Estimated: >60 mL/min
Glucose, Bld: 99 mg/dL (ref 70–99)
Potassium: 3 mmol/L — ABNORMAL LOW (ref 3.5–5.1)
Sodium: 134 mmol/L — ABNORMAL LOW (ref 135–145)

## 2022-03-10 MED ORDER — SODIUM CHLORIDE 0.9 % IV SOLN
INTRAVENOUS | Status: DC | PRN
Start: 2022-03-10 — End: 2022-03-16

## 2022-03-10 MED ORDER — METOCLOPRAMIDE HCL 5 MG/ML IJ SOLN
10.0000 mg | Freq: Three times a day (TID) | INTRAMUSCULAR | Status: DC
  Administered 2022-03-10 – 2022-03-16 (×18): 10 mg via INTRAVENOUS
  Filled 2022-03-10 (×17): qty 2

## 2022-03-10 NOTE — Progress Notes (Signed)
PROGRESS NOTE    Tiffany [name]  [id number] DOB: 1961/02/06 DOA: 03/07/2022 PCP: Nelle Don, MD    Brief Narrative:  61 year female with history of gastritis, obesity, dyslipidemia, GERD, hyperlipidemia, frequent nausea and vomiting, hypokalemia, hiatal hernia, bipolar, who presents emergency department for chief concerns of nausea and vomiting.  Patient has had numerous readmissions for similar complaints.  The exact etiology of her nausea and vomiting has not been identified.  Patient had recent EGD and extensive CT imaging survey without identifiable cause.  She has been assigned a diagnosis of gastroparesis but does not have a history of diabetes.  GI consulted, case discussed at length with GI service PA.  No plans for repeat endoscopic evaluation as it is unlikely to change management.  Patient has limited response to IV Reglan.  Still with nausea and poor p.o. intake.  Discussed with GI.  No evidence of CVA on MRI.  Likely functional paraplegia secondary to malnourished state.  We will attempt IV erythromycin for gastroparesis treatment.  3/11: Patient more sleepy this morning.  Spouse at bedside states that symptoms have worsened over the past 24 hours.   Assessment & Plan:   Principal Problem:   Intractable nausea and vomiting Active Problems:   GERD (gastroesophageal reflux disease)   Primary hypertension   Bipolar 1 disorder (HCC)   Asthma, chronic   Dyslipidemia   HLD (hyperlipidemia)   COPD (chronic obstructive pulmonary disease) (HCC)   Migraine   Depression   Hypokalemia due to excessive gastrointestinal loss of potassium  * Intractable nausea and vomiting Etiology is unclear Multiple admissions for the same Upper EGD on 2/14.  Unclear nature of extrinsic compression of the distal esophagus Recent CT chest and CT neck does not demonstrate any soft tissue mass or other etiology for external compression GI consulted Patient has a diagnosis of  gastroparesis but unclear etiology of this Hemoglobin A1c 4.6, nondiabetic range Plan: Restart Reglan 10 mg IV 3 times daily scheduled Continue erythromycin 3 mg/kg every 8 hours IV Continue IVF Continue gastroparesis diet as tolerated  History of CVA Functional paraplegia Discussed with GI Patient has left-sided weakness Per family at bedside she has been unable to ambulate No evidence of CVA on MRI Suspect functional paraplegia secondary to malnourished state Plan: Therapy evaluations Optimize nutritional status is much as possible    COPD (chronic obstructive pulmonary disease) (HCC) No evidence of acute exacerbation Plan: Resume home inhalers DuoNebs 3 times daily Supplemental oxygen as needed   GERD (gastroesophageal reflux disease) Twice daily PPI and H2 blocker  Hypokalemia due to excessive gastrointestinal loss of potassium Presumed secondary to GI loss Monitor and replace as necessary  Depression Assessment & Plan - Resumed home duloxetine 30 mg daily - Resume trazodone 100 mg nightly   HLD (hyperlipidemia) Assessment & Plan - Atorvastatin 80 mg nightly resumed   Dyslipidemia Assessment & Plan - Atorvastatin 80 mg daily resumed     DVT prophylaxis: SQ Lovenox Code Status: Full Family Communication: Spouse at bedside 3/9, 3/10, 3/11 Disposition Plan: Status is: Inpatient Remains inpatient appropriate because: Intractable nausea and vomiting requiring IV antiemetics.  Inability to tolerate p.o. suspect underlying gastroparesis    Level of care: Med-Surg  Consultants:  GI, signed off 3/10  Procedures:  None  Antimicrobials: None   Subjective: Patient seen and examined.  Very sleepy this morning.  History provided by spouse at bedside  Objective: Vitals:   03/09/22 2025 03/10/22 0327 03/10/22 0808 03/10/22 0943  BP:  114/74  132/73  Pulse:  (!) 102 (!) (P) 103 100  Resp:  16 (P) 16 18  Temp:  98.2 F (36.8 C)  98.3 F (36.8 C)   TempSrc:  Oral  Oral  SpO2: 92% 96% (P) 97% 98%  Weight:      Height:        Intake/Output Summary (Last 24 hours) at 03/10/2022 1133 Last data filed at 03/10/2022 1000 Gross per 24 hour  Intake 1179.99 ml  Output 2050 ml  Net -870.01 ml   Filed Weights   03/07/22 1306  Weight: 113 kg    Examination:  General exam: Sleepy Respiratory system: Clear to auscultation. Respiratory effort normal. Cardiovascular system: S1-S2, RRR, no murmurs, no pedal edema Gastrointestinal system: Obese, NT/ND, normal bowel sounds Central nervous system: Sleepy but arousable.  No acute deficit Extremities: Left-sided weakness Skin: No rashes, lesions or ulcers Psychiatry: Judgement and insight appear impaired. Mood & affect flattened.     Data Reviewed: I have personally reviewed following labs and imaging studies  CBC: Recent Labs  Lab 03/07/22 1314 03/08/22 0513 03/10/22 0748  WBC 5.0 4.9 4.0  NEUTROABS 2.4  --  2.0  HGB 8.4* 8.3* 7.9*  HCT 25.4* 24.3* 23.7*  MCV 83.0 82.1 81.4  PLT 222 191 0000000   Basic Metabolic Panel: Recent Labs  Lab 03/07/22 1314 03/07/22 1332 03/07/22 2008 03/08/22 0513 03/09/22 0855 03/10/22 0748  NA 137  --   --  137 134* 134*  K 2.9*  --   --  3.2* 3.0* 3.0*  CL 93*  --   --  95* 96* 97*  CO2 31  --   --  29 28 30   GLUCOSE 73  --   --  68* 70 99  BUN <5*  --   --  <5* <5* <5*  CREATININE 0.50  --   --  0.42* 0.41* 0.60  CALCIUM 8.3*  --   --  8.3* 7.7* 7.9*  MG  --  2.0  --  1.4* 1.6*  --   PHOS  --   --  2.6  --   --   --    GFR: Estimated Creatinine Clearance: 101.5 mL/min (by C-G formula based on SCr of 0.6 mg/dL). Liver Function Tests: Recent Labs  Lab 03/07/22 1314  AST 17  ALT 13  ALKPHOS 44  BILITOT 1.2  PROT 5.5*  ALBUMIN 2.5*   No results for input(s): LIPASE, AMYLASE in the last 168 hours. No results for input(s): AMMONIA in the last 168 hours. Coagulation Profile: No results for input(s): INR, PROTIME in the last 168  hours. Cardiac Enzymes: No results for input(s): CKTOTAL, CKMB, CKMBINDEX, TROPONINI in the last 168 hours. BNP (last 3 results) No results for input(s): PROBNP in the last 8760 hours. HbA1C: Recent Labs    03/08/22 0513  HGBA1C 4.6*   CBG: No results for input(s): GLUCAP in the last 168 hours. Lipid Profile: No results for input(s): CHOL, HDL, LDLCALC, TRIG, CHOLHDL, LDLDIRECT in the last 72 hours. Thyroid Function Tests: No results for input(s): TSH, T4TOTAL, FREET4, T3FREE, THYROIDAB in the last 72 hours. Anemia Panel: No results for input(s): VITAMINB12, FOLATE, FERRITIN, TIBC, IRON, RETICCTPCT in the last 72 hours. Sepsis Labs: Recent Labs  Lab 03/07/22 1314  LATICACIDVEN 0.9    No results found for this or any previous visit (from the past 240 hour(s)).       Radiology Studies: MR BRAIN W WO CONTRAST  Result Date:  03/08/2022 CLINICAL DATA:  Neuro deficit, acute, stroke suspected. EXAM: MRI HEAD WITHOUT AND WITH CONTRAST TECHNIQUE: Multiplanar, multiecho pulse sequences of the brain and surrounding structures were obtained without and with intravenous contrast. CONTRAST:  20mL GADAVIST GADOBUTROL 1 MMOL/ML IV SOLN COMPARISON:  Prior head CT examinations 11/13/2021 and earlier. FINDINGS: Brain: Cerebral volume appears within normal limits for age. Redemonstrated "empty" and somewhat expanded sella turcica. No significant cerebral white matter disease for age. No cortical encephalomalacia is identified. There is no acute infarct. No evidence of an intracranial mass. No chronic intracranial blood products. No extra-axial fluid collection. No midline shift. No pathologic intracranial enhancement identified. Vascular: Maintained flow voids within the proximal large arterial vessels. 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 Skull and upper cervical spine: No focal suspicious marrow lesion Sinuses/Orbits: Bilateral proptosis. Trace mucosal thickening  within the bilateral ethmoid sinuses. Other: Small-volume fluid within the bilateral mastoid air cells. IMPRESSION: No evidence of acute intracranial abnormality. Redemonstrated "empty" and somewhat expanded sella turcica. While these findings can reflect incidental anatomic variation, they can also be associated with idiopathic intracranial hypertension (pseudotumor cerebri). Otherwise unremarkable MRI appearance of the brain for age. Bilateral proptosis. Small-volume fluid within the bilateral mastoid air cells. Electronically Signed   By: Kellie Simmering D.O.   On: 03/08/2022 14:46        Scheduled Meds:  (feeding supplement) PROSource Plus  30 mL Oral TID BM   vitamin C  500 mg Oral BID   atorvastatin  80 mg Oral Daily   budesonide  0.25 mg Nebulization BID   DULoxetine  30 mg Oral Daily   enoxaparin (LOVENOX) injection  55 mg Subcutaneous QHS   famotidine  20 mg Oral BID   feeding supplement  1 Container Oral TID BM   folic acid  5 mg Oral Daily   metoCLOPramide (REGLAN) injection  10 mg Intravenous Q8H   multivitamin with minerals  1 tablet Oral Daily   pantoprazole (PROTONIX) IV  40 mg Intravenous BID   tiotropium  18 mcg Inhalation QHS   zinc sulfate  220 mg Oral Daily   Continuous Infusions:  sodium chloride Stopped (03/10/22 0326)   dextrose 5 % and 0.9 % NaCl with KCl 20 mEq/L 100 mL/hr at 03/10/22 0414   erythromycin 340 mg (03/10/22 0909)   promethazine (PHENERGAN) injection (IM or IVPB) 12.5 mg (03/10/22 [id number])     LOS: 2 days        Sidney Ace, MD [facility]   If 7PM-7AM, please contact night-coverage  03/10/2022, 11:33 AM

## 2022-03-10 NOTE — Plan of Care (Signed)

## 2022-03-11 LAB — BASIC METABOLIC PANEL
Anion gap: 7 (ref 5–15)
BUN: <5 mg/dL — ABNORMAL LOW (ref 6–20)
CO2: 28 mmol/L (ref 22–32)
Calcium: 7.8 mg/dL — ABNORMAL LOW (ref 8.9–10.3)
Chloride: 100 mmol/L (ref 98–111)
Creatinine, Ser: 0.52 mg/dL (ref 0.44–1.00)
GFR, Estimated: >60 mL/min (ref >60)
Glucose, Bld: 88 mg/dL (ref 70–99)
Potassium: 3 mmol/L — ABNORMAL LOW (ref 3.5–5.1)
Sodium: 135 mmol/L (ref 135–145)

## 2022-03-11 LAB — CBC WITH DIFFERENTIAL/PLATELET
Abs Immature Granulocytes: 0.02 10*3/uL (ref 0.00–0.07)
Basophils Absolute: 0 10*3/uL (ref 0.0–0.1)
Basophils Relative: 1 %
Eosinophils Absolute: 0.1 10*3/uL (ref 0.0–0.5)
Eosinophils Relative: 2 %
HCT: 23.4 % — ABNORMAL LOW (ref 36.0–46.0)
Hemoglobin: 7.9 g/dL — ABNORMAL LOW (ref 12.0–15.0)
Immature Granulocytes: 1 %
Lymphocytes Relative: 42 %
Lymphs Abs: 1.4 10*3/uL (ref 0.7–4.0)
MCH: 27.8 pg (ref 26.0–34.0)
MCHC: 33.8 g/dL (ref 30.0–36.0)
MCV: 82.4 fL (ref 80.0–100.0)
Monocytes Absolute: 0.4 10*3/uL (ref 0.1–1.0)
Monocytes Relative: 12 %
Neutro Abs: 1.4 10*3/uL — ABNORMAL LOW (ref 1.7–7.7)
Neutrophils Relative %: 42 %
Platelets: 186 10*3/uL (ref 150–400)
RBC: 2.84 MIL/uL — ABNORMAL LOW (ref 3.87–5.11)
RDW: 16 % — ABNORMAL HIGH (ref 11.5–15.5)
WBC: 3.3 10*3/uL — ABNORMAL LOW (ref 4.0–10.5)
nRBC: 0.6 % — ABNORMAL HIGH (ref 0.0–0.2)

## 2022-03-11 MED ORDER — HALOPERIDOL LACTATE 5 MG/ML IJ SOLN
1.0000 mg | Freq: Three times a day (TID) | INTRAMUSCULAR | Status: DC
  Administered 2022-03-11 – 2022-03-13 (×7): 1 mg via INTRAVENOUS
  Filled 2022-03-11 (×6): qty 1

## 2022-03-11 NOTE — Plan of Care (Signed)

## 2022-03-11 NOTE — Progress Notes (Signed)
PROGRESS NOTE    Tiffany [name]  [id number] DOB: 08/15/1961 DOA: 03/07/2022 PCP: Eddie Dibbles, MD    Brief Narrative:  61 year female with history of gastritis, obesity, dyslipidemia, GERD, hyperlipidemia, frequent nausea and vomiting, hypokalemia, hiatal hernia, bipolar, who presents emergency department for chief concerns of nausea and vomiting.  Patient has had numerous readmissions for similar complaints.  The exact etiology of her nausea and vomiting has not been identified.  Patient had recent EGD and extensive CT imaging survey without identifiable cause.  She has been assigned a diagnosis of gastroparesis but does not have a history of diabetes.  GI consulted, case discussed at length with GI service PA.  No plans for repeat endoscopic evaluation as it is unlikely to change management.  Patient has limited response to IV Reglan.  Still with nausea and poor p.o. intake.  Discussed with GI.  No evidence of CVA on MRI.  Likely functional paraplegia secondary to malnourished state.  We will attempt IV erythromycin for gastroparesis treatment.  3/11: Patient more sleepy this morning.  Spouse at bedside states that symptoms have worsened over the past 24 hours.  3/12: Patient's p.o. tolerance continues to be very poor.  Unclear etiology.  Seeming nonresponse to erythromycin and Reglan.     Assessment & Plan:   Principal Problem:   Intractable nausea and vomiting Active Problems:   GERD (gastroesophageal reflux disease)   Primary hypertension   Bipolar 1 disorder (HCC)   Asthma, chronic   Dyslipidemia   HLD (hyperlipidemia)   COPD (chronic obstructive pulmonary disease) (HCC)   Migraine   Depression   Hypokalemia due to excessive gastrointestinal loss of potassium  * Intractable nausea and vomiting Etiology is unclear Multiple admissions for the same Upper EGD on 2/14.  Unclear nature of extrinsic compression of the distal esophagus Recent CT chest and CT neck  does not demonstrate any soft tissue mass or other etiology for external compression GI consulted, now signed off Patient has a diagnosis of gastroparesis but unclear etiology of this Hemoglobin A1c 4.6, nondiabetic range Plan: Continue Reglan 10 mg IV 3 times daily scheduled Continue erythromycin 3 mg/kg every 8 hours IV Add Haldol 1 mg IV 3 times daily scheduled Continue IVF Continue gastroparesis diet as tolerated  History of CVA Functional paraplegia Discussed with GI Patient has left-sided weakness Per family at bedside she has been unable to ambulate No evidence of CVA on MRI Suspect functional paraplegia secondary to malnourished state Plan: Therapy evaluations Optimize nutritional status is much as possible RD consultation    COPD (chronic obstructive pulmonary disease) (HCC) No evidence of acute exacerbation Plan: Resume home inhalers DuoNebs 3 times daily Supplemental oxygen as needed   GERD (gastroesophageal reflux disease) Twice daily PPI and H2 blocker  Hypokalemia due to excessive gastrointestinal loss of potassium Presumed secondary to GI loss Monitor and replace as necessary  Depression Assessment & Plan - Resumed home duloxetine 30 mg daily - Resume trazodone 100 mg nightly   HLD (hyperlipidemia) Assessment & Plan - Atorvastatin 80 mg nightly resumed   Dyslipidemia Assessment & Plan - Atorvastatin 80 mg daily resumed     DVT prophylaxis: SQ Lovenox Code Status: Full Family Communication: Spouse at bedside 3/9, 3/10, 3/11 Disposition Plan: Status is: Inpatient Remains inpatient appropriate because: Intractable nausea and vomiting requiring IV antiemetics.  Inability to tolerate p.o. unclear etiology.  Suspect functional    Level of care: Med-Surg  Consultants:  GI, signed off 3/10  Procedures:  None  Antimicrobials: None   Subjective: Patient seen and examined.  More awake this morning.  Continues to endorse nausea and poor  p.o. tolerance  Objective: Vitals:   03/10/22 2025 03/11/22 0429 03/11/22 0746 03/11/22 0812  BP: (!) 145/82 131/73  133/68  Pulse: (!) 105 90 84 91  Resp: 20 20 16    Temp: 98.3 F (36.8 C) 97.9 F (36.6 C)  98.1 F (36.7 C)  TempSrc: Oral Oral    SpO2: 92% 94% 96% 97%  Weight:      Height:        Intake/Output Summary (Last 24 hours) at 03/11/2022 1026 Last data filed at 03/11/2022 1000 Gross per 24 hour  Intake 1451.11 ml  Output 1250 ml  Net 201.11 ml   Filed Weights   03/07/22 1306  Weight: 113 kg    Examination:  General exam: NAD.  Appears chronically ill Respiratory system: Lungs clear.  Normal work of breathing.  Room air Cardiovascular system: S1-S2, RRR, no murmurs, no pedal edema Gastrointestinal system: Obese, NT/ND, normal bowel sounds Central nervous system: Sleepy but arousable.  No acute deficit Extremities: Left-sided weakness Skin: No rashes, lesions or ulcers Psychiatry: Judgement and insight appear impaired. Mood & affect flattened.     Data Reviewed: I have personally reviewed following labs and imaging studies  CBC: Recent Labs  Lab 03/07/22 1314 03/08/22 0513 03/10/22 0748 03/11/22 0811  WBC 5.0 4.9 4.0 3.3*  NEUTROABS 2.4  --  2.0 1.4*  HGB 8.4* 8.3* 7.9* 7.9*  HCT 25.4* 24.3* 23.7* 23.4*  MCV 83.0 82.1 81.4 82.4  PLT 222 191 188 186   Basic Metabolic Panel: Recent Labs  Lab 03/07/22 1314 03/07/22 1332 03/07/22 2008 03/08/22 0513 03/09/22 0855 03/10/22 0748 03/11/22 0811  NA 137  --   --  137 134* 134* 135  K 2.9*  --   --  3.2* 3.0* 3.0* 3.0*  CL 93*  --   --  95* 96* 97* 100  CO2 31  --   --  29 28 30 28   GLUCOSE 73  --   --  68* 70 99 88  BUN <5*  --   --  <5* <5* <5* <5*  CREATININE 0.50  --   --  0.42* 0.41* 0.60 0.52  CALCIUM 8.3*  --   --  8.3* 7.7* 7.9* 7.8*  MG  --  2.0  --  1.4* 1.6*  --   --   PHOS  --   --  2.6  --   --   --   --    GFR: Estimated Creatinine Clearance: 101.5 mL/min (by C-G formula based  on SCr of 0.52 mg/dL). Liver Function Tests: Recent Labs  Lab 03/07/22 1314  AST 17  ALT 13  ALKPHOS 44  BILITOT 1.2  PROT 5.5*  ALBUMIN 2.5*   No results for input(s): LIPASE, AMYLASE in the last 168 hours. No results for input(s): AMMONIA in the last 168 hours. Coagulation Profile: No results for input(s): INR, PROTIME in the last 168 hours. Cardiac Enzymes: No results for input(s): CKTOTAL, CKMB, CKMBINDEX, TROPONINI in the last 168 hours. BNP (last 3 results) No results for input(s): PROBNP in the last 8760 hours. HbA1C: No results for input(s): HGBA1C in the last 72 hours.  CBG: No results for input(s): GLUCAP in the last 168 hours. Lipid Profile: No results for input(s): CHOL, HDL, LDLCALC, TRIG, CHOLHDL, LDLDIRECT in the last 72 hours. Thyroid Function Tests: No results for  input(s): TSH, T4TOTAL, FREET4, T3FREE, THYROIDAB in the last 72 hours. Anemia Panel: No results for input(s): VITAMINB12, FOLATE, FERRITIN, TIBC, IRON, RETICCTPCT in the last 72 hours. Sepsis Labs: Recent Labs  Lab 03/07/22 1314  LATICACIDVEN 0.9    No results found for this or any previous visit (from the past 240 hour(s)).       Radiology Studies: No results found.      Scheduled Meds:  (feeding supplement) PROSource Plus  30 mL Oral TID BM   vitamin C  500 mg Oral BID   atorvastatin  80 mg Oral Daily   budesonide  0.25 mg Nebulization BID   DULoxetine  30 mg Oral Daily   enoxaparin (LOVENOX) injection  55 mg Subcutaneous QHS   famotidine  20 mg Oral BID   feeding supplement  1 Container Oral TID BM   folic acid  5 mg Oral Daily   haloperidol lactate  1 mg Intravenous Q8H   metoCLOPramide (REGLAN) injection  10 mg Intravenous Q8H   multivitamin with minerals  1 tablet Oral Daily   pantoprazole (PROTONIX) IV  40 mg Intravenous BID   tiotropium  18 mcg Inhalation QHS   zinc sulfate  220 mg Oral Daily   Continuous Infusions:  sodium chloride Stopped (03/10/22 0326)    dextrose 5 % and 0.9 % NaCl with KCl 20 mEq/L 100 mL/hr at 03/11/22 0911   erythromycin 340 mg (03/11/22 0814)   promethazine (PHENERGAN) injection (IM or IVPB) 12.5 mg (03/10/22 1209)     LOS: 3 days        Tresa Moore, MD [facility]   If 7PM-7AM, please contact night-coverage  03/11/2022, 10:26 AM

## 2022-03-11 NOTE — Plan of Care (Signed)
?  Problem: Clinical Measurements: ?Goal: Ability to maintain clinical measurements within normal limits will improve ?Outcome: Progressing ?Goal: Will remain free from infection ?Outcome: Progressing ?Goal: Diagnostic test results will improve ?Outcome: Progressing ?Goal: Respiratory complications will improve ?Outcome: Progressing ?Goal: Cardiovascular complication will be avoided ?Outcome: Progressing ?  ?Pt appears very weak and been sleepy this shift. Pt does wake when s Pt refused to eat, drink and oral medicine. Pt said the smell makes her very nauseas. Scheduled Reglan IV given. V/S stable. Denies any pain or SOB.  ?

## 2022-03-12 LAB — CBC WITH DIFFERENTIAL/PLATELET
Abs Immature Granulocytes: 0.03 10*3/uL (ref 0.00–0.07)
Basophils Absolute: 0 10*3/uL (ref 0.0–0.1)
Basophils Relative: 1 %
Eosinophils Absolute: 0.1 10*3/uL (ref 0.0–0.5)
Eosinophils Relative: 3 %
HCT: 22.8 % — ABNORMAL LOW (ref 36.0–46.0)
Hemoglobin: 7.5 g/dL — ABNORMAL LOW (ref 12.0–15.0)
Immature Granulocytes: 1 %
Lymphocytes Relative: 44 %
Lymphs Abs: 1.3 10*3/uL (ref 0.7–4.0)
MCH: 27.1 pg (ref 26.0–34.0)
MCHC: 32.9 g/dL (ref 30.0–36.0)
MCV: 82.3 fL (ref 80.0–100.0)
Monocytes Absolute: 0.4 10*3/uL (ref 0.1–1.0)
Monocytes Relative: 13 %
Neutro Abs: 1.1 10*3/uL — ABNORMAL LOW (ref 1.7–7.7)
Neutrophils Relative %: 38 %
Platelets: 182 10*3/uL (ref 150–400)
RBC: 2.77 MIL/uL — ABNORMAL LOW (ref 3.87–5.11)
RDW: 16.1 % — ABNORMAL HIGH (ref 11.5–15.5)
WBC: 3 10*3/uL — ABNORMAL LOW (ref 4.0–10.5)
nRBC: 0.7 % — ABNORMAL HIGH (ref 0.0–0.2)

## 2022-03-12 LAB — BASIC METABOLIC PANEL
Anion gap: 5 (ref 5–15)
BUN: <5 mg/dL — ABNORMAL LOW (ref 6–20)
CO2: 30 mmol/L (ref 22–32)
Calcium: 7.6 mg/dL — ABNORMAL LOW (ref 8.9–10.3)
Chloride: 99 mmol/L (ref 98–111)
Creatinine, Ser: 0.59 mg/dL (ref 0.44–1.00)
GFR, Estimated: >60 mL/min (ref >60)
Glucose, Bld: 97 mg/dL (ref 70–99)
Potassium: 2.9 mmol/L — ABNORMAL LOW (ref 3.5–5.1)
Sodium: 134 mmol/L — ABNORMAL LOW (ref 135–145)

## 2022-03-12 MED ORDER — POTASSIUM CHLORIDE 10 MEQ/100ML IV SOLN
10.0000 meq | INTRAVENOUS | Status: AC
  Administered 2022-03-12 (×3): 10 meq via INTRAVENOUS
  Filled 2022-03-12 (×2): qty 100

## 2022-03-12 MED ORDER — POTASSIUM CHLORIDE 10 MEQ/100ML IV SOLN
10.0000 meq | INTRAVENOUS | Status: AC
  Administered 2022-03-12: 10 meq via INTRAVENOUS
  Filled 2022-03-12: qty 100

## 2022-03-12 MED ORDER — MAGNESIUM SULFATE 2 GM/50ML IV SOLN
2.0000 g | Freq: Once | INTRAVENOUS | Status: AC
  Administered 2022-03-12: 2 g via INTRAVENOUS
  Filled 2022-03-12: qty 50

## 2022-03-12 NOTE — Progress Notes (Signed)
Occupational Therapy Treatment ?Patient Details ?Name: Tiffany [name] ?MRN: [id number] ?DOB: [birth year] ?Today's Date: 03/12/2022 ? ? ?History of present illness Patient is a 61 year old female who reports to [facility] for intractable nausea and vomiting. PMH includes  gastritis, obesity, dyslipidemia, GERD, hyperlipidemia, frequent nausea and vomiting, hypokalemia, hiatal hernia, bipolar. ?  ?OT comments ? Pt seen for OT tx and co-tx with PT to address ADL mobility. Pt required significant encouragement from therapists to participate and overcome fatigue and headache complaints. Pt required TOTAL A +2 for bed mobility and varying assist to maintain static sitting balance, improving with time and encouragement from TOTAL A to MOD A. While seated EOB with assist to maintain, pt engaged in dynamic reaching task to challenge balance and improve trunk/core strength, pt able to reach in front of her L then R UE x2 before endorsing fatigue limiting further efforts. Pt also engaged in BLE ex prior to return to bed after ~61min. Will continue to progress as able.  ?  ? ?Recommendations for follow up therapy are one component of a multi-disciplinary discharge planning process, led by the attending physician.  Recommendations may be updated based on patient status, additional functional criteria and insurance authorization. ?   ?Follow Up Recommendations ? Skilled nursing-short term rehab (<3 hours/day)  ?  ?Assistance Recommended at Discharge Frequent or constant Supervision/Assistance  ?Patient can return home with the following ? Assist for transportation;Assistance with cooking/housework;Help with stairs or ramp for entrance;Direct supervision/assist for medications management;A lot of help with bathing/dressing/bathroom;Two people to help with walking and/or transfers ?  ?Equipment Recommendations ? Hospital bed  ?  ?Recommendations for Other Services   ? ?  ?Precautions / Restrictions Precautions ?Precautions:  Fall ?Restrictions ?Weight Bearing Restrictions: No  ? ? ?  ? ?Mobility Bed Mobility ?Overal bed mobility: Needs Assistance ?Bed Mobility: Supine to Sit, Sit to Supine ?  ?  ?Supine to sit: Total assist, +2 for physical assistance ?Sit to supine: +2 for physical assistance, Total assist ?  ?General bed mobility comments: +2 max assist for bed mobility. air bed deflated for ease of transition. Needs heavy encouragement to participate. Once seated at EOB, able to sit with mod A +1 for trunkal elevation as pt presents with post lean. ?  ? ?Transfers ?  ?  ?  ?  ?  ?  ?  ?  ?  ?General transfer comment: pt unable and declined ?  ?  ?Balance Overall balance assessment: Needs assistance ?Sitting-balance support: Bilateral upper extremity supported ?Sitting balance-Leahy Scale: Poor ?Sitting balance - Comments: for long sitting in bed, pt unable to maintain on her own, initially requiring TOTAL A, improving to MOD A for static sitting baalnce ?  ?  ?  ?  ?  ?  ?  ?  ?  ?  ?  ?  ?  ?  ?  ?   ? ?ADL either performed or assessed with clinical judgement  ? ?ADL   ?  ?  ?  ?  ?  ?  ?  ?  ?  ?  ?  ?  ?  ?  ?  ?  ?  ?  ?  ?  ?  ? ?Extremity/Trunk Assessment   ?  ?  ?  ?  ?  ? ?Vision   ?  ?  ?Perception   ?  ?Praxis   ?  ? ?Cognition Arousal/Alertness: Awake/alert ?Behavior During Therapy: Flat affect ?Overall Cognitive Status: Within  Functional Limits for tasks assessed ?  ?  ?  ?  ?  ?  ?  ?  ?  ?  ?  ?  ?  ?  ?  ?  ?General Comments: pt sleeping upon arrival, however does awaken. Takes increased encouragement to work with therapy. Appears very flat ?  ?  ?   ?Exercises Other Exercises ?Other Exercises: While seated EOB with assist to maintain, pt engaged in dynamic reaching task to challenge balance and improve trunk/core strength, pt able to reach in front of her L then R UE x2 before endorsing fatigue limiting further efforts ? ?  ?Shoulder Instructions   ? ? ?  ?General Comments    ? ? ?Pertinent Vitals/ Pain       Pain  Assessment ?Pain Assessment: Faces ?Faces Pain Scale: Hurts whole lot ?Pain Location: headache ?Pain Descriptors / Indicators: Discomfort, Headache ?Pain Intervention(s): Limited activity within patient's tolerance, Monitored during session, Repositioned ? ?Home Living   ?  ?  ?  ?  ?  ?  ?  ?  ?  ?  ?  ?  ?  ?  ?  ?  ?  ?  ? ?  ?Prior Functioning/Environment    ?  ?  ?  ?   ? ?Frequency ? Min 1X/week  ? ? ? ? ?  ?Progress Toward Goals ? ?OT Goals(current goals can now be found in the care plan section) ? Progress towards OT goals: Progressing toward goals ? ?Acute Rehab OT Goals ?Patient Stated Goal: go home ?OT Goal Formulation: With patient/family ?Time For Goal Achievement: 03/23/22 ?Potential to Achieve Goals: Fair  ?Plan Discharge plan remains appropriate;Frequency remains appropriate   ? ?Co-evaluation ? ? ? PT/OT/SLP Co-Evaluation/Treatment: Yes ?Reason for Co-Treatment: Complexity of the patient's impairments (multi-system involvement);To address functional/ADL transfers;For patient/therapist safety ?PT goals addressed during session: Mobility/safety with mobility;Balance;Strengthening/ROM ?OT goals addressed during session: ADL's and self-care ?  ? ?  ?AM-PAC OT "6 Clicks" Daily Activity     ?Outcome Measure ? ? Help from another person eating meals?: A Little ?Help from another person taking care of personal grooming?: A Little ?Help from another person toileting, which includes using toliet, bedpan, or urinal?: Total ?Help from another person bathing (including washing, rinsing, drying)?: A Lot ?Help from another person to put on and taking off regular upper body clothing?: A Lot ?Help from another person to put on and taking off regular lower body clothing?: Total ?6 Click Score: 12 ? ?  ?End of Session   ? ?OT Visit Diagnosis: Other abnormalities of gait and mobility (R26.89);Repeated falls (R29.6);Muscle weakness (generalized) (M62.81) ?  ?Activity Tolerance Patient limited by fatigue ?  ?Patient Left  in bed;with call bell/phone within reach;with bed alarm set;with family/visitor present ?  ?Nurse Communication   ?  ? ?   ? ?Time: [id number] ?OT Time Calculation (min): 26 min ? ?Charges: OT General Charges ?$OT Visit: 1 Visit ?OT Treatments ?$Therapeutic Activity: 8-22 mins ? ?Ardeth Perfect., MPH, MS, OTR/L ?ascom [phone] ?03/12/22, 4:44 PM ? ?

## 2022-03-12 NOTE — Progress Notes (Signed)
Physical Therapy Treatment ?Patient Details ?Name: Tiffany [name] ?MRN: [id number] ?DOB: 04/22/1961 ?Today's Date: 03/12/2022 ? ? ?History of Present Illness Patient is a 61 year old female who reports to [facility] for intractable nausea and vomiting. PMH includes  gastritis, obesity, dyslipidemia, GERD, hyperlipidemia, frequent nausea and vomiting, hypokalemia, hiatal hernia, bipolar. ? ?  ?PT Comments  ? ? Pt is making gradual progress towards goals with heavy encouragement for participation. Pt sleeping upon arrival, however easily awakens for PT/OT combined session. Pt able to sit at EOB for approx 5 mins performing there-ex. Quick fatigue with patient requesting multiple times to return to supine. Dependent transfer sup<>sit, however pt able to participate while seated EOB. Very low tolerance for therapy and general disinterest in improving physical ability. Educated on skin protection/HEP/progression. Will continue to progress as able.  ?Recommendations for follow up therapy are one component of a multi-disciplinary discharge planning process, led by the attending physician.  Recommendations may be updated based on patient status, additional functional criteria and insurance authorization. ? ?Follow Up Recommendations ? Skilled nursing-short term rehab (<3 hours/day) ?  ?  ?Assistance Recommended at Discharge Frequent or constant Supervision/Assistance  ?Patient can return home with the following Two people to help with walking and/or transfers;Two people to help with bathing/dressing/bathroom;Assistance with cooking/housework;Direct supervision/assist for medications management;Assist for transportation;Help with stairs or ramp for entrance;Direct supervision/assist for financial management ?  ?Equipment Recommendations ? Hospital bed  ?  ?Recommendations for Other Services   ? ? ?  ?Precautions / Restrictions Precautions ?Precautions: Fall ?Restrictions ?Weight Bearing Restrictions: No  ?  ? ?Mobility ? Bed  Mobility ?Overal bed mobility: Needs Assistance ?Bed Mobility: Supine to Sit, Sit to Supine ?  ?  ?Supine to sit: Total assist, +2 for physical assistance ?Sit to supine: +2 for physical assistance, Total assist ?  ?General bed mobility comments: +2 max assist for bed mobility. air bed deflated for ease of transition. Needs heavy encouragement to participate. Once seated at EOB, able to sit with mod A +1 for trunkal elevation as pt presents with post lean. ?  ? ?Transfers ?  ?  ?  ?  ?  ?  ?  ?  ?  ?General transfer comment: pt unable and declined ?  ? ?Ambulation/Gait ?  ?  ?  ?  ?  ?  ?  ?General Gait Details: unable ? ? ?Stairs ?  ?  ?  ?  ?  ? ? ?Wheelchair Mobility ?  ? ?Modified Rankin (Stroke Patients Only) ?  ? ? ?  ?Balance Overall balance assessment: Needs assistance ?Sitting-balance support: Bilateral upper extremity supported ?Sitting balance-Leahy Scale: Poor ?  ?  ?  ?  ?  ?  ?  ?  ?  ?  ?  ?  ?  ?  ?  ?  ?  ? ?  ?Cognition Arousal/Alertness: Awake/alert ?Behavior During Therapy: Flat affect ?Overall Cognitive Status: Within Functional Limits for tasks assessed ?  ?  ?  ?  ?  ?  ?  ?  ?  ?  ?  ?  ?  ?  ?  ?  ?General Comments: pt sleeping upon arrival, however does awaken. Takes increased participation to work with therapy. Appears very flat ?  ?  ? ?  ?Exercises Other Exercises ?Other Exercises: seated ther-ex performed on B LE including LAQ and alt marching. Pt very hesitant to perform therex with heavy encouragement to perform between 5-10 reps. Also performed seated  weight shifting while at EOB with less physical assist required. Able to sit at EOB approx 5 mins for activity asking multiple times to return back supine ?Other Exercises: supine ther-ex performed including heel slides and hip abd/add. 5-10 reps performed with min/mod assist. Educated on continuing HEP with SO in room. ? ?  ?General Comments   ?  ?  ? ?Pertinent Vitals/Pain Pain Assessment ?Pain Assessment: Faces ?Faces Pain Scale:  Hurts whole lot ?Pain Location: headache ?Pain Descriptors / Indicators: Discomfort ?Pain Intervention(s): Limited activity within patient's tolerance, Premedicated before session, Repositioned, Monitored during session  ? ? ?Home Living   ?  ?  ?  ?  ?  ?  ?  ?  ?  ?   ?  ?Prior Function    ?  ?  ?   ? ?PT Goals (current goals can now be found in the care plan section) Acute Rehab PT Goals ?Patient Stated Goal: family wants patient to be able to walk, patient and family want her to return home ?PT Goal Formulation: With patient ?Time For Goal Achievement: 03/26/22 ?Potential to Achieve Goals: Fair ?Progress towards PT goals: Progressing toward goals ? ?  ?Frequency ? ? ? Min 2X/week ? ? ? ?  ?PT Plan Current plan remains appropriate  ? ? ?Co-evaluation PT/OT/SLP Co-Evaluation/Treatment: Yes ?Reason for Co-Treatment: Complexity of the patient's impairments (multi-system involvement);For patient/therapist safety;To address functional/ADL transfers ?PT goals addressed during session: Mobility/safety with mobility;Balance;Strengthening/ROM ?  ?  ? ?  ?AM-PAC PT "6 Clicks" Mobility   ?Outcome Measure ? Help needed turning from your back to your side while in a flat bed without using bedrails?: A Lot ?Help needed moving from lying on your back to sitting on the side of a flat bed without using bedrails?: A Lot ?Help needed moving to and from a bed to a chair (including a wheelchair)?: Total ?Help needed standing up from a chair using your arms (e.g., wheelchair or bedside chair)?: Total ?Help needed to walk in hospital room?: Total ?Help needed climbing 3-5 steps with a railing? : Total ?6 Click Score: 8 ? ?  ?End of Session   ?Activity Tolerance: Patient limited by fatigue ?Patient left: in bed;with call bell/phone within reach;with bed alarm set;with family/visitor present ?Nurse Communication: Mobility status ?PT Visit Diagnosis: History of falling (Z91.81);Muscle weakness (generalized) (M62.81) ?  ? ? ?Time:  7741-2878 ?PT Time Calculation (min) (ACUTE ONLY): 27 min ? ?Charges:  $Therapeutic Activity: 8-22 mins          ?          ? ?Elizabeth Palau, PT, DPT, GCS ?[phone] ? ? ? ?Tiffany [name] ?03/12/2022, 3:36 PM ? ?

## 2022-03-12 NOTE — Progress Notes (Signed)
PROGRESS NOTE    Tiffany [name]  ZOX:[id number] DOB: 08-09-1961 DOA: 03/07/2022 PCP: Eddie DibblesAllison, James H, MD    Brief Narrative:  [age] year female with history of gastritis, obesity, dyslipidemia, GERD, hyperlipidemia, frequent nausea and vomiting, hypokalemia, hiatal hernia, bipolar, who presents emergency department for chief concerns of nausea and vomiting.  Patient has had numerous readmissions for similar complaints.  The exact etiology of her nausea and vomiting has not been identified.  Patient had recent EGD and extensive CT imaging survey without identifiable cause.  She has been assigned a diagnosis of gastroparesis but does not have a history of diabetes.  GI consulted, case discussed at length with GI service PA.  No plans for repeat endoscopic evaluation as it is unlikely to change management.  Patient has limited response to IV Reglan.  Still with nausea and poor p.o. intake.  Discussed with GI.  No evidence of CVA on MRI.  Likely functional paraplegia secondary to malnourished state.  We will attempt IV erythromycin for gastroparesis treatment.  3/11: Patient more sleepy this morning.  Spouse at bedside states that symptoms have worsened over the past 24 hours.  3/12: Patient's p.o. tolerance continues to be very poor.  Unclear etiology.  Seeming nonresponse to erythromycin and Reglan.  3/13: Patient reports interval improvement in intractable nausea and vomiting     Assessment & Plan:   Principal Problem:   Intractable nausea and vomiting Active Problems:   GERD (gastroesophageal reflux disease)   Primary hypertension   Bipolar 1 disorder (HCC)   Asthma, chronic   Dyslipidemia   HLD (hyperlipidemia)   COPD (chronic obstructive pulmonary disease) (HCC)   Migraine   Depression   Hypokalemia due to excessive gastrointestinal loss of potassium  * Intractable nausea and vomiting Etiology is unclear Multiple admissions for the same Upper EGD on 2/14.  Unclear nature  of extrinsic compression of the distal esophagus Recent CT chest and CT neck does not demonstrate any soft tissue mass or other etiology for external compression GI consulted, now signed off Patient has a diagnosis of gastroparesis but unclear etiology of this Hemoglobin A1c 4.6, nondiabetic range Appears to be responding to addition of p.o. Reglan 10 Plan: Continue Reglan 10 mg IV 3 times daily scheduled Continue erythromycin 3 mg/kg every 8 hours IV Continue Haldol 1 mg IV 3 times daily scheduled Continue IVF Continue gastroparesis diet as tolerated  History of CVA Functional paraplegia Discussed with GI Patient has left-sided weakness Per family at bedside she has been unable to ambulate No evidence of CVA on MRI Suspect functional paraplegia secondary to malnourished state Plan: Therapy evaluations Optimize nutritional status is much as possible RD consultation Frequent encouragement    COPD (chronic obstructive pulmonary disease) (HCC) No evidence of acute exacerbation Plan: Resume home inhalers DuoNebs 3 times daily Supplemental oxygen as needed   GERD (gastroesophageal reflux disease) Twice daily PPI and H2 blocker  Hypokalemia due to excessive gastrointestinal loss of potassium Presumed secondary to GI loss Monitor and replace as necessary  Depression Assessment & Plan - Resumed home duloxetine 30 mg daily - Resume trazodone 100 mg nightly   HLD (hyperlipidemia) Assessment & Plan - Atorvastatin 80 mg nightly resumed   Dyslipidemia Assessment & Plan - Atorvastatin 80 mg daily resumed     DVT prophylaxis: SQ Lovenox Code Status: Full Family Communication: Spouse at bedside 3/9, 3/10, 3/11, 3/13 Disposition Plan: Status is: Inpatient Remains inpatient appropriate because: Intractable nausea and vomiting requiring IV antiemetics.  Inability to  tolerate p.o. unclear etiology.  Suspect functional.  Appears to be slowly improving    Level of care:  Med-Surg  Consultants:  GI, signed off 3/10  Procedures:  None  Antimicrobials: None   Subjective: Patient seen and examined.  More awake this morning.  Nausea and poor p.o. tolerance have improved Objective: Vitals:   03/11/22 2015 03/11/22 2300 03/12/22 0426 03/12/22 0845  BP:  136/72 129/74 124/68  Pulse:  86 84 88  Resp:    16  Temp:  98.3 F (36.8 C) 98.3 F (36.8 C) 98.1 F (36.7 C)  TempSrc:  Axillary Oral Oral  SpO2: 96% 96% 94% 94%  Weight:      Height:        Intake/Output Summary (Last 24 hours) at 03/12/2022 1048 Last data filed at 03/12/2022 0600 Gross per 24 hour  Intake 1446.25 ml  Output 1600 ml  Net -153.75 ml   Filed Weights   03/07/22 1306  Weight: 113 kg    Examination:  General exam: No acute distress.  Appears chronically ill Respiratory system: Lungs clear.  Normal work of breathing.  Room air Cardiovascular system: S1-S2, RRR, no murmurs, no pedal edema Gastrointestinal system: Obese, NT/ND, normal bowel sounds Central nervous system: Sleepy but arousable.  No acute deficit Extremities: Left-sided weakness.  Diffuse muscle wasting Skin: No rashes, lesions or ulcers Psychiatry: Judgement and insight appear impaired. Mood & affect flattened.     Data Reviewed: I have personally reviewed following labs and imaging studies  CBC: Recent Labs  Lab 03/07/22 1314 03/08/22 0513 03/10/22 0748 03/11/22 0811 03/12/22 0859  WBC 5.0 4.9 4.0 3.3* 3.0*  NEUTROABS 2.4  --  2.0 1.4* 1.1*  HGB 8.4* 8.3* 7.9* 7.9* 7.5*  HCT 25.4* 24.3* 23.7* 23.4* 22.8*  MCV 83.0 82.1 81.4 82.4 82.3  PLT 222 191 188 186 182   Basic Metabolic Panel: Recent Labs  Lab 03/07/22 1332 03/07/22 2008 03/08/22 0513 03/09/22 0855 03/10/22 0748 03/11/22 0811 03/12/22 0859  NA  --   --  137 134* 134* 135 134*  K  --   --  3.2* 3.0* 3.0* 3.0* 2.9*  CL  --   --  95* 96* 97* 100 99  CO2  --   --  29 28 30 28 30   GLUCOSE  --   --  68* 70 99 88 97  BUN  --   --   <5* <5* <5* <5* <5*  CREATININE  --   --  0.42* 0.41* 0.60 0.52 0.59  CALCIUM  --   --  8.3* 7.7* 7.9* 7.8* 7.6*  MG 2.0  --  1.4* 1.6*  --   --   --   PHOS  --  2.6  --   --   --   --   --    GFR: Estimated Creatinine Clearance: 101.5 mL/min (by C-G formula based on SCr of 0.59 mg/dL). Liver Function Tests: Recent Labs  Lab 03/07/22 1314  AST 17  ALT 13  ALKPHOS 44  BILITOT 1.2  PROT 5.5*  ALBUMIN 2.5*   No results for input(s): LIPASE, AMYLASE in the last 168 hours. No results for input(s): AMMONIA in the last 168 hours. Coagulation Profile: No results for input(s): INR, PROTIME in the last 168 hours. Cardiac Enzymes: No results for input(s): CKTOTAL, CKMB, CKMBINDEX, TROPONINI in the last 168 hours. BNP (last 3 results) No results for input(s): PROBNP in the last 8760 hours. HbA1C: No results for input(s): HGBA1C  in the last 72 hours.  CBG: No results for input(s): GLUCAP in the last 168 hours. Lipid Profile: No results for input(s): CHOL, HDL, LDLCALC, TRIG, CHOLHDL, LDLDIRECT in the last 72 hours. Thyroid Function Tests: No results for input(s): TSH, T4TOTAL, FREET4, T3FREE, THYROIDAB in the last 72 hours. Anemia Panel: No results for input(s): VITAMINB12, FOLATE, FERRITIN, TIBC, IRON, RETICCTPCT in the last 72 hours. Sepsis Labs: Recent Labs  Lab 03/07/22 1314  LATICACIDVEN 0.9    No results found for this or any previous visit (from the past 240 hour(s)).       Radiology Studies: No results found.      Scheduled Meds:  (feeding supplement) PROSource Plus  30 mL Oral TID BM   vitamin C  500 mg Oral BID   atorvastatin  80 mg Oral Daily   budesonide  0.25 mg Nebulization BID   DULoxetine  30 mg Oral Daily   enoxaparin (LOVENOX) injection  55 mg Subcutaneous QHS   famotidine  20 mg Oral BID   feeding supplement  1 Container Oral TID BM   folic acid  5 mg Oral Daily   haloperidol lactate  1 mg Intravenous Q8H   metoCLOPramide (REGLAN) injection   10 mg Intravenous Q8H   multivitamin with minerals  1 tablet Oral Daily   pantoprazole (PROTONIX) IV  40 mg Intravenous BID   tiotropium  18 mcg Inhalation QHS   zinc sulfate  220 mg Oral Daily   Continuous Infusions:  sodium chloride Stopped (03/11/22 0343)   dextrose 5 % and 0.9 % NaCl with KCl 20 mEq/L 100 mL/hr at 03/12/22 0949   erythromycin 340 mg (03/12/22 0950)   promethazine (PHENERGAN) injection (IM or IVPB) 12.5 mg (03/10/22 1209)     LOS: 4 days        Tresa Moore, MD [facility]   If 7PM-7AM, please contact night-coverage  03/12/2022, 10:48 AM

## 2022-03-13 LAB — BASIC METABOLIC PANEL
Anion gap: 6 (ref 5–15)
BUN: <5 mg/dL — ABNORMAL LOW (ref 6–20)
CO2: 31 mmol/L (ref 22–32)
Calcium: 7.6 mg/dL — ABNORMAL LOW (ref 8.9–10.3)
Chloride: 98 mmol/L (ref 98–111)
Creatinine, Ser: 0.5 mg/dL (ref 0.44–1.00)
GFR, Estimated: >60 mL/min (ref >60)
Glucose, Bld: 96 mg/dL (ref 70–99)
Potassium: 3.2 mmol/L — ABNORMAL LOW (ref 3.5–5.1)
Sodium: 135 mmol/L (ref 135–145)

## 2022-03-13 LAB — MAGNESIUM: Magnesium: 1.7 mg/dL (ref 1.7–2.4)

## 2022-03-13 MED ORDER — MAGNESIUM SULFATE 2 GM/50ML IV SOLN
2.0000 g | Freq: Once | INTRAVENOUS | Status: AC
  Administered 2022-03-13: 2 g via INTRAVENOUS
  Filled 2022-03-13: qty 50

## 2022-03-13 MED ORDER — HALOPERIDOL LACTATE 5 MG/ML IJ SOLN
1.0000 mg | Freq: Four times a day (QID) | INTRAMUSCULAR | Status: DC
  Administered 2022-03-13 – 2022-03-16 (×13): 1 mg via INTRAVENOUS
  Filled 2022-03-13 (×13): qty 1

## 2022-03-13 NOTE — Progress Notes (Signed)
?PROGRESS NOTE ? ? ? ?Tiffany [name]  UKG:[id number] DOB: [birth year] DOA: 03/07/2022 ?PCP: Eddie Dibbles, MD  ? ? ?Brief Narrative:  ?61 year female with history of gastritis, obesity, dyslipidemia, GERD, hyperlipidemia, frequent nausea and vomiting, hypokalemia, hiatal hernia, bipolar, who presents emergency department for chief concerns of nausea and vomiting. ? ?Patient has had numerous readmissions for similar complaints.  The exact etiology of her nausea and vomiting has not been identified.  Patient had recent EGD and extensive CT imaging survey without identifiable cause.  She has been assigned a diagnosis of gastroparesis but does not have a history of diabetes. ? ?GI consulted, case discussed at length with GI service PA.  No plans for repeat endoscopic evaluation as it is unlikely to change management. ? ?Patient has limited response to IV Reglan.  Still with nausea and poor p.o. intake.  Discussed with GI.  No evidence of CVA on MRI.  Likely functional paraplegia secondary to malnourished state.  We will attempt IV erythromycin for gastroparesis treatment. ? ?3/11: Patient more sleepy this morning.  Spouse at bedside states that symptoms have worsened over the past 24 hours. ? ?3/12: Patient's p.o. tolerance continues to be very poor.  Unclear etiology.  Seeming nonresponse to erythromycin and Reglan. ? ?3/13: Patient reports interval improvement in intractable nausea and vomiting ?3/14: Patient reports nausea with even the smell of liquids or food ? ? ? ? ?Assessment & Plan: ?  ?Principal Problem: ?  Intractable nausea and vomiting ?Active Problems: ?  GERD (gastroesophageal reflux disease) ?  Primary hypertension ?  Bipolar 1 disorder (HCC) ?  Asthma, chronic ?  Dyslipidemia ?  HLD (hyperlipidemia) ?  COPD (chronic obstructive pulmonary disease) (HCC) ?  Migraine ?  Depression ?  Hypokalemia due to excessive gastrointestinal loss of potassium ? ?* Intractable nausea and vomiting ?Etiology is  unclear ?Multiple admissions for the same ?Upper EGD on 2/14.  Unclear nature of extrinsic compression of the distal esophagus ?Recent CT chest and CT neck does not demonstrate any soft tissue mass or other etiology for external compression ?GI consulted, now signed off ?Patient has a diagnosis of gastroparesis but unclear etiology of this ?Hemoglobin A1c 4.6, nondiabetic range ?-Patient endorses that she gets nauseous with even the smell of food or liquids.  Nursing has not visualized any actual emesis.  Suspect psychiatric component at play ?Plan: ?Continue Reglan 10 mg IV 3 times daily scheduled ?DC  erythromycin ?Increase Haldol 1 mg every 6 hours scheduled ?Discontinue intravenous fluids ?Continue gastroparesis diet as tolerated ? ?History of CVA ?Functional paraplegia ?Discussed with GI ?Patient has left-sided weakness ?Per family at bedside she has been unable to ambulate ?No evidence of CVA on MRI ?Suspect functional paraplegia secondary to malnourished state ?Plan: ?Therapy evaluations ?Optimize nutritional status is much as possible ?RD consultation ?Quires frequent encouragement ? ?  ?COPD (chronic obstructive pulmonary disease) (HCC) ?No evidence of acute exacerbation ?Plan: ?Resume home inhalers ?DuoNebs 3 times daily ?Supplemental oxygen as needed ?  ?GERD (gastroesophageal reflux disease) ?Twice daily PPI and H2 blocker ? ?Hypokalemia due to excessive gastrointestinal loss of potassium ?Presumed secondary to GI loss ?Monitor and replace as necessary ? ?Depression ?Assessment & Plan ?- Resumed home duloxetine 30 mg daily ?- Resume trazodone 100 mg nightly ?  ?HLD (hyperlipidemia) ?Assessment & Plan ?- Atorvastatin 80 mg nightly resumed ?  ?Dyslipidemia ?Assessment & Plan ?- Atorvastatin 80 mg daily resumed ? ? ? ? ?DVT prophylaxis: SQ Lovenox ?Code Status: Full ?Family Communication:  Spouse at bedside 3/9, 3/10, 3/11, 3/13, 3/14 ?Disposition Plan: Status is: Inpatient ?Remains inpatient appropriate  because: Intractable nausea and vomiting requiring IV antiemetics.  Inability to tolerate p.o. unclear etiology.  Suspect functional/psychiatric.   ? ? ?Level of care: Med-Surg ? ?Consultants:  ?GI, signed off 3/10 ? ?Procedures:  ?None ? ?Antimicrobials: ?None ? ? ?Subjective: ?Patient seen and examined.  Awake and communicative this morning.  Reports that she gets nauseous even with the smell of food or liquids ? ?Objective: ?Vitals:  ? 03/12/22 1546 03/12/22 2001 03/13/22 0408 03/13/22 0818  ?BP: 121/68 105/63 107/66 123/69  ?Pulse: 86 85 84 83  ?Resp: 16 18 18 16   ?Temp: 97.7 ?F (36.5 ?C) 98.1 ?F (36.7 ?C) 98 ?F (36.7 ?C) 97.9 ?F (36.6 ?C)  ?TempSrc: Oral Oral Oral Oral  ?SpO2: 92% 95% 94% 93%  ?Weight:      ?Height:      ? ? ?Intake/Output Summary (Last 24 hours) at 03/13/2022 1401 ?Last data filed at 03/13/2022 1224 ?Gross per 24 hour  ?Intake 3967.42 ml  ?Output 3700 ml  ?Net 267.42 ml  ? ?Filed Weights  ? 03/07/22 1306  ?Weight: 113 kg  ? ? ?Examination: ? ?General exam: No acute distress.  Fatigued.  Chronically ill ?Respiratory system: Lungs clear.  Normal work of breathing.  Room air ?Cardiovascular system: S1-S2, RRR, no murmurs, no pedal edema ?Gastrointestinal system: Obese, soft, NT/ND, normal bowel sounds ?Central nervous system: Sleepy but arousable.  No acute deficit ?Extremities: Left-sided weakness.  Diffuse muscle wasting ?Skin: No rashes, lesions or ulcers ?Psychiatry: Judgement and insight appear impaired. Mood & affect flattened.  ? ? ? ?Data Reviewed: I have personally reviewed following labs and imaging studies ? ?CBC: ?Recent Labs  ?Lab 03/07/22 ?1314 03/08/22 ?[id number] 03/10/22 ?[id number] 03/11/22 ?[id number] 03/12/22 ?[id number]  ?WBC 5.0 4.9 4.0 3.3* 3.0*  ?NEUTROABS 2.4  --  2.0 1.4* 1.1*  ?HGB 8.4* 8.3* 7.9* 7.9* 7.5*  ?HCT 25.4* 24.3* 23.7* 23.4* 22.8*  ?MCV 83.0 82.1 81.4 82.4 82.3  ?PLT 222 191 188 186 182  ? ?Basic Metabolic Panel: ?Recent Labs  ?Lab 03/07/22 ?1332 03/07/22 ?2008 03/08/22 ?[id number]  03/09/22 ?[id number] 03/10/22 ?[id number] 03/11/22 ?[id number] 03/12/22 ?[id number] 03/13/22 ?0450  ?NA  --   --  137 134* 134* 135 134* 135  ?K  --   --  3.2* 3.0* 3.0* 3.0* 2.9* 3.2*  ?CL  --   --  95* 96* 97* 100 99 98  ?CO2  --   --  29 28 30 28 30 31   ?GLUCOSE  --   --  68* 70 99 88 97 96  ?BUN  --   --  <5* <5* <5* <5* <5* <5*  ?CREATININE  --   --  0.42* 0.41* 0.60 0.52 0.59 0.50  ?CALCIUM  --   --  8.3* 7.7* 7.9* 7.8* 7.6* 7.6*  ?MG 2.0  --  1.4* 1.6*  --   --   --  1.7  ?PHOS  --  2.6  --   --   --   --   --   --   ? ?GFR: ?Estimated Creatinine Clearance: 101.5 mL/min (by C-G formula based on SCr of 0.5 mg/dL). ?Liver Function Tests: ?Recent Labs  ?Lab 03/07/22 ?1314  ?AST 17  ?ALT 13  ?ALKPHOS 44  ?BILITOT 1.2  ?PROT 5.5*  ?ALBUMIN 2.5*  ? ?No results for input(s): LIPASE, AMYLASE in the last 168 hours. ?No results for input(s): AMMONIA in the last 168  hours. ?Coagulation Profile: ?No results for input(s): INR, PROTIME in the last 168 hours. ?Cardiac Enzymes: ?No results for input(s): CKTOTAL, CKMB, CKMBINDEX, TROPONINI in the last 168 hours. ?BNP (last 3 results) ?No results for input(s): PROBNP in the last 8760 hours. ?HbA1C: ?No results for input(s): HGBA1C in the last 72 hours. ? ?CBG: ?No results for input(s): GLUCAP in the last 168 hours. ?Lipid Profile: ?No results for input(s): CHOL, HDL, LDLCALC, TRIG, CHOLHDL, LDLDIRECT in the last 72 hours. ?Thyroid Function Tests: ?No results for input(s): TSH, T4TOTAL, FREET4, T3FREE, THYROIDAB in the last 72 hours. ?Anemia Panel: ?No results for input(s): VITAMINB12, FOLATE, FERRITIN, TIBC, IRON, RETICCTPCT in the last 72 hours. ?Sepsis Labs: ?Recent Labs  ?Lab 03/07/22 ?1314  ?LATICACIDVEN 0.9  ? ? ?No results found for this or any previous visit (from the past 240 hour(s)).  ? ? ? ? ? ?Radiology Studies: ?No results found. ? ? ? ? ? ?Scheduled Meds: ? (feeding supplement) PROSource Plus  30 mL Oral TID BM  ? vitamin C  500 mg Oral BID  ? atorvastatin  80 mg Oral Daily  ?  budesonide  0.25 mg Nebulization BID  ? DULoxetine  30 mg Oral Daily  ? enoxaparin (LOVENOX) injection  55 mg Subcutaneous QHS  ? famotidine  20 mg Oral BID  ? feeding supplement  1 Container Oral TID BM  ? folic acid  5 mg Oral

## 2022-03-14 ENCOUNTER — Encounter: Payer: Self-pay | Admitting: Internal Medicine

## 2022-03-14 LAB — MAGNESIUM: Magnesium: 2 mg/dL (ref 1.7–2.4)

## 2022-03-14 LAB — BASIC METABOLIC PANEL
Anion gap: 5 (ref 5–15)
BUN: <5 mg/dL — ABNORMAL LOW (ref 6–20)
CO2: 32 mmol/L (ref 22–32)
Calcium: 7.7 mg/dL — ABNORMAL LOW (ref 8.9–10.3)
Chloride: 97 mmol/L — ABNORMAL LOW (ref 98–111)
Creatinine, Ser: 0.51 mg/dL (ref 0.44–1.00)
GFR, Estimated: >60 mL/min (ref >60)
Glucose, Bld: 89 mg/dL (ref 70–99)
Potassium: 3 mmol/L — ABNORMAL LOW (ref 3.5–5.1)
Sodium: 134 mmol/L — ABNORMAL LOW (ref 135–145)

## 2022-03-14 LAB — PHOSPHORUS: Phosphorus: 2.3 mg/dL — ABNORMAL LOW (ref 2.5–4.6)

## 2022-03-14 MED ORDER — POTASSIUM CHLORIDE 10 MEQ/100ML IV SOLN
10.0000 meq | INTRAVENOUS | Status: AC
  Administered 2022-03-14 (×5): 10 meq via INTRAVENOUS
  Filled 2022-03-14 (×4): qty 100

## 2022-03-14 NOTE — Progress Notes (Signed)
Nutrition Follow-up ? ?DOCUMENTATION CODES:  ? ?Obesity unspecified ? ?INTERVENTION:  ? ?-D/c Prosource ?-D/c Boost Breeze ?-Consider enteral nutrition support (pt may benefit from post-pyloric tube secondary to gastroparesis):  ? ?Initiate Osmolite 1.5 @ 25 ml/hr and increase by 10 ml every 12 hours to goal rate of 55 ml/hr.  ? ?45 ml Prosource TF TID.   ? ?150 ml free water flush every 4 hours ? ?Tube feeding regimen provides 2100 kcal (100% of needs), 116 grams of protein, and 1006 ml of H2O.  Total free water: 1906 ml daily ? ?NUTRITION DIAGNOSIS:  ? ?Inadequate oral intake related to decreased appetite, nausea as evidenced by meal completion < 25%, per patient/family report. ? ?Ongoing ? ?GOAL:  ? ?Patient will meet greater than or equal to 90% of their needs ? ?Progressing  ? ?MONITOR:  ? ?PO intake, Supplement acceptance, Labs, Weight trends, Skin, I & O's ? ?REASON FOR ASSESSMENT:  ? ?Malnutrition Screening Tool ?  ? ?ASSESSMENT:  ? ?61 year female with history of gastritis, obesity, dyslipidemia, GERD, hyperlipidemia, frequent nausea and vomiting, hypokalemia, hiatal hernia, bipolar, who presents emergency department for chief concerns of nausea and vomiting. ? ?Reviewed I/O's: +28 ml x 24 hours and -3 L since admission ? ?UOP: 700 ml x 24 hours ? ?Spoke with pt at bedside. Per pt, she has been eating very little due to nausea and vomiting. Pt shares she ate only soup yesterday and that is the majority of what she has been eating. She express frustration over lack of progress. She has been refusing all supplements; she refuses offer of there supplements. RD ordered lunch tray per pt request (jello and ice pops).  ? ?Case discussed with MD regarding possible feeding tube (possible post-pyloric tube). MD reports he will discuss with pt and family.  ? ?Medications reviewed sodium chloride, vitamin C, reglan, and zinc sulfate.  ? ?Labs reviewed: Na: 134, K: 3.0, Phos: 2.3.   ? ?Diet Order:   ?Diet Order   ? ?        ?  DIET DYS 2 Room service appropriate? Yes; Fluid consistency: Thin  Diet effective now       ?  ? ?  ?  ? ?  ? ? ?EDUCATION NEEDS:  ? ?Not appropriate for education at this time ? ?Skin:  Skin Integrity Issues:: Stage II ?Stage II: lt and rt buttocks ? ?Last BM:  Unknown ? ?Height:  ? ?Ht Readings from Last 1 Encounters:  ?03/07/22 5\' 9"  (1.753 m)  ? ? ?Weight:  ? ?Wt Readings from Last 1 Encounters:  ?03/07/22 113 kg  ? ? ?Ideal Body Weight:  65.9 kg ? ?BMI:  Body mass index is 36.79 kg/m?. ? ?Estimated Nutritional Needs:  ? ?Kcal:  1950-2150 ? ?Protein:  115-130 grams ? ?Fluid:  >1.9 L ? ? ? ?Loistine Chance, RD, LDN, CDCES ?Registered Dietitian II ?Certified Diabetes Care and Education Specialist ?Please refer to [facility] for RD and/or RD on-call/weekend/after hours pager  ?

## 2022-03-14 NOTE — Progress Notes (Addendum)
? ? ? ?Progress Note  ? ? ?Tiffany [name]  IOX:[id number] DOB: 1961/11/21  DOA: 03/07/2022 ?PCP: Eddie Dibbles, MD  ? ? ? ? ?Brief Narrative:  ? ? ?Medical records reviewed and are as summarized below: ? ?Tiffany [name] is a 61 y.o. female with medical history of gastritis, obesity, dyslipidemia, GERD, hyperlipidemia, frequent nausea and vomiting, hypokalemia, hiatal hernia, bipolar disorder, who presented to the hospital because of nausea and vomiting.  She has been admitted multiple times for similar complaints.  The exact etiology of nausea and vomiting is unclear.  She had recent EGD and abdominal imaging which did not identify the cause.  There is a questionable history of gastroparesis. ? ? ? ? ? ?Assessment/Plan:  ? ?Principal Problem: ?  Intractable nausea and vomiting ?Active Problems: ?  GERD (gastroesophageal reflux disease) ?  Primary hypertension ?  Bipolar 1 disorder (HCC) ?  Asthma, chronic ?  Dyslipidemia ?  HLD (hyperlipidemia) ?  COPD (chronic obstructive pulmonary disease) (HCC) ?  Migraine ?  Depression ?  Hypokalemia due to excessive gastrointestinal loss of potassium ? ? ?Nutrition Problem: Inadequate oral intake ?Etiology: decreased appetite, nausea ? ?Signs/Symptoms: meal completion < 25%, per patient/family report ? ? ?Body mass index is 36.79 kg/m?.  (Obesity) ? ?Intractable nausea and vomiting, recurrent, suspected gastroparesis: Etiology is unclear.  EGD and abdominal imaging have been unrevealing.  Continue antiemetics.  GI has signed off. ? ?Hypokalemia: Replete potassium intravenously.  She states she is unable to tolerate oral potassium. ? ?Poor oral intake, weight loss: She has been refusing nutritional supplements.  Encouraged adequate oral intake.  Follow-up with dietitian ? ?History of stroke, paraparesis: Patient said she has not been able to ambulate since November 2022.  This was confirmed by her fianc? at the bedside. ? ?COPD: Continue Spiriva ? ?History of bipolar  disorder: No acute issues.  Patient denies any history of depression and does not want to continue Cymbalta and trazodone.  She said Cymbalta and trazodone were prescribed many years ago and she has not taken them for over 3 years.  She moved from [place] to [place] about 3 years ago and she said she has not used these medicines since she moved from [place]. ? ?Other complications include chronic anemia (iron and B12 levels were normal), GERD, hyperlipidemia, distal esophageal diverticulum ? ?Diet Order   ? ?       ?  DIET DYS 2 Room service appropriate? Yes; Fluid consistency: Thin  Diet effective now       ?  ? ?  ?  ? ?  ? ? ? ? ?Consultants: ?Gastroenterologist ? ?Procedures: ?None ? ? ? ?Medications:  ? ? (feeding supplement) PROSource Plus  30 mL Oral TID BM  ? vitamin C  500 mg Oral BID  ? atorvastatin  80 mg Oral Daily  ? budesonide  0.25 mg Nebulization BID  ? DULoxetine  30 mg Oral Daily  ? enoxaparin (LOVENOX) injection  55 mg Subcutaneous QHS  ? famotidine  20 mg Oral BID  ? feeding supplement  1 Container Oral TID BM  ? folic acid  5 mg Oral Daily  ? haloperidol lactate  1 mg Intravenous Q6H  ? metoCLOPramide (REGLAN) injection  10 mg Intravenous Q8H  ? multivitamin with minerals  1 tablet Oral Daily  ? pantoprazole (PROTONIX) IV  40 mg Intravenous BID  ? tiotropium  18 mcg Inhalation QHS  ? zinc sulfate  220 mg Oral  Daily  ? ?Continuous Infusions: ? sodium chloride Stopped (03/11/22 0343)  ? potassium chloride 10 mEq (03/14/22 1241)  ? promethazine (PHENERGAN) injection (IM or IVPB) 12.5 mg (03/10/22 1209)  ? ? ? ?Anti-infectives (From admission, onward)  ? ? Start     Dose/Rate Route Frequency Ordered Stop  ? 03/09/22 1030  erythromycin 340 mg in sodium chloride 0.9 % 100 mL IVPB  Status:  Discontinued       ? 3 mg/kg ? 113 kg ?100 mL/hr over 60 Minutes Intravenous Every 8 hours 03/09/22 5208 03/13/22 0908  ? ?  ? ? ? ? ? ? ? ? ? ?Family Communication/Anticipated D/C date and plan/Code  Status  ? ?DVT prophylaxis: Place TED hose Start: 03/07/22 1854 ? ? ?  Code Status: Full Code ? ?Family Communication: Plan discussed with her fianc? at the bedside ?Disposition Plan: Plan to discharge to SNF ? ? ?Status is: Inpatient ?Remains inpatient appropriate because: Generalized weakness, intractable nausea and vomiting ? ? ? ? ? ? ?Subjective:  ? ?Interval events noted.  She said she vomited yesterday but none today as of this morning.  She complains of nausea and she is unable to tolerate oral medicines.  Her fianc? was at the bedside. ? ?Objective:  ? ? ?Vitals:  ? 03/13/22 2058 03/13/22 2137 03/14/22 0329 03/14/22 0828  ?BP:  126/74 134/77 119/68  ?Pulse:  86 93 87  ?Resp:  18 20   ?Temp:  97.8 ?F (36.6 ?C) 98.4 ?F (36.9 ?C) 98 ?F (36.7 ?C)  ?TempSrc:  Oral  Oral  ?SpO2: 98% 94% 96% 94%  ?Weight:      ?Height:      ? ?No data found. ? ? ?Intake/Output Summary (Last 24 hours) at 03/14/2022 1345 ?Last data filed at 03/13/2022 1635 ?Gross per 24 hour  ?Intake --  ?Output 700 ml  ?Net -700 ml  ? ?Filed Weights  ? 03/07/22 1306  ?Weight: 113 kg  ? ? ?Exam: ? ?GEN: NAD ?SKIN: Warm and dry ?EYES: EOMI ?ENT: MMM ?CV: RRR ?PULM: CTA B ?ABD: soft, obese, NT, +BS ?CNS: AAO x 3, weakness in the extremities (UEs-4/5, LEs- 3/5)  ?EXT: No edema or tenderness ? ? ? ?Pressure Injury 02/07/22 Buttocks Left Stage 2 -  Partial thickness loss of dermis presenting as a shallow open injury with a red, pink wound bed without slough. red and flaky (Active)  ?02/07/22 2238  ?Location: Buttocks  ?Location Orientation: Left  ?Staging: Stage 2 -  Partial thickness loss of dermis presenting as a shallow open injury with a red, pink wound bed without slough.  ?Wound Description (Comments): red and flaky  ?Present on Admission: Yes  ?   ?Pressure Injury Buttocks Right Stage 2 -  Partial thickness loss of dermis presenting as a shallow open injury with a red, pink wound bed without slough. pink and flaky (Active)  ?   ?Location: Buttocks   ?Location Orientation: Right  ?Staging: Stage 2 -  Partial thickness loss of dermis presenting as a shallow open injury with a red, pink wound bed without slough.  ?Wound Description (Comments): pink and flaky  ?Present on Admission: Yes  ? ? ? ?Data Reviewed:  ? ?I have personally reviewed following labs and imaging studies: ? ?Labs: ?Labs show the following:  ? ?Basic Metabolic Panel: ?Recent Labs  ?Lab 03/07/22 ?1332 03/07/22 ?2008 03/08/22 ?0223 03/09/22 ?3612 03/10/22 ?2449 03/11/22 ?7530 03/12/22 ?0511 03/13/22 ?0450 03/14/22 ?0211  ?NA  --   --  137 134* 134* 135 134* 135 134*  ?K  --   --  3.2* 3.0* 3.0* 3.0* 2.9* 3.2* 3.0*  ?CL  --   --  95* 96* 97* 100 99 98 97*  ?CO2  --   --  29 28 30 28 30 31  32  ?GLUCOSE  --   --  68* 70 99 88 97 96 89  ?BUN  --   --  <5* <5* <5* <5* <5* <5* <5*  ?CREATININE  --   --  0.42* 0.41* 0.60 0.52 0.59 0.50 0.51  ?CALCIUM  --   --  8.3* 7.7* 7.9* 7.8* 7.6* 7.6* 7.7*  ?MG 2.0  --  1.4* 1.6*  --   --   --  1.7 2.0  ?PHOS  --  2.6  --   --   --   --   --   --  2.3*  ? ?GFR ?Estimated Creatinine Clearance: 101.5 mL/min (by C-G formula based on SCr of 0.51 mg/dL). ?Liver Function Tests: ?Recent Labs  ?Lab 03/07/22 ?1314  ?AST 17  ?ALT 13  ?ALKPHOS 44  ?BILITOT 1.2  ?PROT 5.5*  ?ALBUMIN 2.5*  ? ?No results for input(s): LIPASE, AMYLASE in the last 168 hours. ?No results for input(s): AMMONIA in the last 168 hours. ?Coagulation profile ?No results for input(s): INR, PROTIME in the last 168 hours. ? ?CBC: ?Recent Labs  ?Lab 03/07/22 ?1314 03/08/22 ?[id number] 03/10/22 ?[id number] 03/11/22 ?[id number] 03/12/22 ?[id number]  ?WBC 5.0 4.9 4.0 3.3* 3.0*  ?NEUTROABS 2.4  --  2.0 1.4* 1.1*  ?HGB 8.4* 8.3* 7.9* 7.9* 7.5*  ?HCT 25.4* 24.3* 23.7* 23.4* 22.8*  ?MCV 83.0 82.1 81.4 82.4 82.3  ?PLT 222 191 188 186 182  ? ?Cardiac Enzymes: ?No results for input(s): CKTOTAL, CKMB, CKMBINDEX, TROPONINI in the last 168 hours. ?BNP (last 3 results) ?No results for input(s): PROBNP in the last 8760 hours. ?CBG: ?No results for  input(s): GLUCAP in the last 168 hours. ?D-Dimer: ?No results for input(s): DDIMER in the last 72 hours. ?Hgb A1c: ?No results for input(s): HGBA1C in the last 72 hours. ?Lipid Profile: ?No results for input(

## 2022-03-14 NOTE — Progress Notes (Signed)
Occupational Therapy Treatment ?Patient Details ?Name: Tiffany [name] ?MRN: [id number] ?DOB: 09-12-1961 ?Today's Date: 03/14/2022 ? ? ?History of present illness Patient is a 61 year old female who reports to [facility] for intractable nausea and vomiting. PMH includes  gastritis, obesity, dyslipidemia, GERD, hyperlipidemia, frequent nausea and vomiting, hypokalemia, hiatal hernia, bipolar. ?  ?OT comments ? Ms. Flamenco was seen for OT/PT co-treatment on this date. Upon arrival to room pt awake/alert, semi-supine in bed and agreeable to therapy session with gentle encouragement. Pt requires TOTAL A to come to sitting at EOB. She demos poor eccentric control and requires MOD-MAX A to maintain static sitting balance at EOB. OT engages pt in UB grooming task while seated upright. Pt is able to bring a washcloth to her face x2 for face washing. Educated on compensatory strategies for management of grooming materials after pt c/o weakness in her RUE and inability to hold washcloth. Pt unwilling to attempt further funcitonal activity this date and returns to semi-supine position in bed. She requires TOTAL A for repositioning in bed, MOD A +1 for rolling L<>R, and TOTAL A +2 for bed linen change after pt noted to be soiled from episode of urinary incontinence. Pt is progressing toward OT goals and continues to benefit from skilled OT services to maximize return to PLOF and minimize risk of future falls, injury, caregiver burden, and readmission. Will continue to follow POC. Discharge recommendation remains appropriate.  ?  ? ?Recommendations for follow up therapy are one component of a multi-disciplinary discharge planning process, led by the attending physician.  Recommendations may be updated based on patient status, additional functional criteria and insurance authorization. ?   ?Follow Up Recommendations ? Skilled nursing-short term rehab (<3 hours/day)  ?  ?Assistance Recommended at Discharge Frequent or constant  Supervision/Assistance  ?Patient can return home with the following ? Assist for transportation;Assistance with cooking/housework;Help with stairs or ramp for entrance;Direct supervision/assist for medications management;A lot of help with bathing/dressing/bathroom;Two people to help with walking and/or transfers ?  ?Equipment Recommendations ? Hospital bed  ?  ?Recommendations for Other Services   ? ?  ?Precautions / Restrictions Precautions ?Precautions: Fall ?Restrictions ?Weight Bearing Restrictions: No  ? ? ?  ? ?Mobility Bed Mobility ?Overal bed mobility: Needs Assistance ?Bed Mobility: Supine to Sit, Sit to Supine, Rolling ?Rolling: Max assist ?  ?Supine to sit: Total assist, +2 for physical assistance ?Sit to supine: +2 for physical assistance, Total assist ?  ?General bed mobility comments: +2 max assist for bed mobility. air bed deflated for ease of transition. Needs heavy encouragement to participate. Once seated at EOB, able to sit with mod-max A +1 for trunkal elevation as pt presents with post lean. ?  ? ?Transfers ?  ?  ?  ?  ?  ?  ?  ?  ?  ?General transfer comment: pt unable and declined ?  ?  ?Balance Overall balance assessment: Needs assistance ?Sitting-balance support: Bilateral upper extremity supported ?Sitting balance-Leahy Scale: Poor ?Sitting balance - Comments: for long sitting in bed, pt unable to maintain on her own, initially requiring TOTAL A, improving to MOD A for static sitting balnce. Balance decreases with functional activity. ?Postural control: Posterior lean ?  ?  ?Standing balance comment: deferred ?  ?  ?  ?  ?  ?  ?  ?  ?  ?  ?  ?   ? ?ADL either performed or assessed with clinical judgement  ? ?ADL Overall ADL's : At baseline;Needs  assistance/impaired ?  ?  ?Grooming: Wash/dry face;Sitting;Moderate assistance;Maximal assistance ?Grooming Details (indicate cue type and reason): MOD-MAX A for sitting balance during functional tasks. Pt with significant posterior lean. Requires  +2 assist for set-up of materials. ?  ?  ?  ?  ?  ?  ?  ?  ?  ?  ?  ?  ?  ?  ?  ?General ADL Comments: MAX A +2 for sup<>sit t/f. Pt with limited endurance for seated activity returns to semi-supine with little warning. Requires near total assist +2 to bring BLE/torso back into correct position in bed. ?  ? ?Extremity/Trunk Assessment Upper Extremity Assessment ?Upper Extremity Assessment: Generalized weakness ?  ?Lower Extremity Assessment ?Lower Extremity Assessment: Generalized weakness ?  ?  ?  ? ?Vision Baseline Vision/History: 1 Wears glasses ?Ability to See in Adequate Light: 1 Impaired ?Patient Visual Report: No change from baseline ?  ?  ?Perception   ?  ?Praxis   ?  ? ?Cognition Arousal/Alertness: Awake/alert ?Behavior During Therapy: Flat affect ?Overall Cognitive Status: Within Functional Limits for tasks assessed ?  ?  ?  ?  ?  ?  ?  ?  ?  ?  ?  ?  ?  ?  ?  ?  ?  ?  ?  ?   ?Exercises Other Exercises ?Other Exercises: While seated EOB with assist to maintain, pt engaged in seated UB ADL management to challenge balance and improve trunk/core strength, pt able to perform brief face washing and doff glasses before endorsing fatigue limiting further efforts by returning to semi-supine position in bed. ? ?  ?Shoulder Instructions   ? ? ?  ?General Comments    ? ? ?Pertinent Vitals/ Pain       Pain Assessment ?Pain Assessment: No/denies pain ? ?Home Living   ?  ?  ?  ?  ?  ?  ?  ?  ?  ?  ?  ?  ?  ?  ?  ?  ?  ?  ? ?  ?Prior Functioning/Environment    ?  ?  ?  ?   ? ?Frequency ? Min 1X/week  ? ? ? ? ?  ?Progress Toward Goals ? ?OT Goals(current goals can now be found in the care plan section) ? Progress towards OT goals: Progressing toward goals ? ?Acute Rehab OT Goals ?Patient Stated Goal: to go home ?OT Goal Formulation: With patient/family ?Time For Goal Achievement: 03/23/22 ?Potential to Achieve Goals: Fair  ?Plan Discharge plan remains appropriate;Frequency remains appropriate   ? ?Co-evaluation ? ? ?  PT/OT/SLP Co-Evaluation/Treatment: Yes ?Reason for Co-Treatment: Complexity of the patient's impairments (multi-system involvement);To address functional/ADL transfers;For patient/therapist safety ?PT goals addressed during session: Mobility/safety with mobility;Balance;Strengthening/ROM ?OT goals addressed during session: ADL's and self-care ?  ? ?  ?AM-PAC OT "6 Clicks" Daily Activity     ?Outcome Measure ? ? Help from another person eating meals?: A Little ?Help from another person taking care of personal grooming?: A Little ?Help from another person toileting, which includes using toliet, bedpan, or urinal?: Total ?Help from another person bathing (including washing, rinsing, drying)?: A Lot ?Help from another person to put on and taking off regular upper body clothing?: A Lot ?Help from another person to put on and taking off regular lower body clothing?: Total ?6 Click Score: 12 ? ?  ?End of Session   ? ?OT Visit Diagnosis: Other abnormalities of gait and mobility (R26.89);Repeated falls (R29.6);Muscle weakness (generalized) (  M62.81) ?  ?Activity Tolerance Patient tolerated treatment well ?  ?Patient Left in bed;with call bell/phone within reach;with bed alarm set ?  ?Nurse Communication Mobility status ?  ? ?   ? ?Time: [id number] ?OT Time Calculation (min): 23 min ? ?Charges: OT General Charges ?$OT Visit: 1 Visit ?OT Treatments ?$Self Care/Home Management : 8-22 mins ? ?Rockney GheeSerenity Hiedi Touchton, M.S., OTR/L ?[facility] ?Ascom: [phone] ?03/14/22, 4:14 PM ? ?

## 2022-03-14 NOTE — Progress Notes (Signed)
Physical Therapy Treatment ?Patient Details ?Name: Tiffany [name] ?MRN: [id number] ?DOB: [birth year] ?Today's Date: 03/14/2022 ? ? ?History of Present Illness Patient is a 61 year old female who reports to [facility] for intractable nausea and vomiting. PMH includes  gastritis, obesity, dyslipidemia, GERD, hyperlipidemia, frequent nausea and vomiting, hypokalemia, hiatal hernia, bipolar. ? ?  ?PT Comments  ? ? CO-treat performed with OT this date for therapist safety. Still requires +2 for all mobility and takes encouragement to sit at EOB. Only able to tolerate sitting x few minutes this date and has uncontrolled decent back to bed. Once seated at EOB, able to wipe face with washcloth with assistance. Pt soiled upon arrival with rolling attempts to place fresh linen. Very poor motivation and inconsistent B LE abilities. Will continue to progress as able.  ?Recommendations for follow up therapy are one component of a multi-disciplinary discharge planning process, led by the attending physician.  Recommendations may be updated based on patient status, additional functional criteria and insurance authorization. ? ?Follow Up Recommendations ? Skilled nursing-short term rehab (<3 hours/day) ?  ?  ?Assistance Recommended at Discharge Frequent or constant Supervision/Assistance  ?Patient can return home with the following Two people to help with walking and/or transfers;Two people to help with bathing/dressing/bathroom;Assistance with cooking/housework;Direct supervision/assist for medications management;Assist for transportation;Help with stairs or ramp for entrance;Direct supervision/assist for financial management ?  ?Equipment Recommendations ? Hospital bed  ?  ?Recommendations for Other Services   ? ? ?  ?Precautions / Restrictions Precautions ?Precautions: Fall ?Restrictions ?Weight Bearing Restrictions: No  ?  ? ?Mobility ? Bed Mobility ?Overal bed mobility: Needs Assistance ?Bed Mobility: Supine to Sit, Sit to Supine,  Rolling ?Rolling: Max assist ?  ?Supine to sit: Total assist, +2 for physical assistance ?Sit to supine: +2 for physical assistance, Total assist ?  ?General bed mobility comments: +2 max assist for bed mobility. air bed deflated for ease of transition. Needs heavy encouragement to participate. Once seated at EOB, able to sit with mod-max A +1 for trunkal elevation as pt presents with post lean. ?  ? ?Transfers ?  ?  ?  ?  ?  ?  ?  ?  ?  ?General transfer comment: pt unable and declined ?  ? ?Ambulation/Gait ?  ?  ?  ?  ?  ?  ?  ?  ? ? ?Stairs ?  ?  ?  ?  ?  ? ? ?Wheelchair Mobility ?  ? ?Modified Rankin (Stroke Patients Only) ?  ? ? ?  ?Balance Overall balance assessment: Needs assistance ?Sitting-balance support: Bilateral upper extremity supported ?Sitting balance-Leahy Scale: Poor ?Sitting balance - Comments: unable to maintain seated balance, needs to prop with B UEs and therapist support ?  ?  ?  ?  ?  ?  ?  ?  ?  ?  ?  ?  ?  ?  ?  ?  ? ?  ?Cognition Arousal/Alertness: Awake/alert ?Behavior During Therapy: Flat affect ?Overall Cognitive Status: Within Functional Limits for tasks assessed ?  ?  ?  ?  ?  ?  ?  ?  ?  ?  ?  ?  ?  ?  ?  ?  ?General Comments: pt relunctantly agreeable to session this date ?  ?  ? ?  ?Exercises Other Exercises ?Other Exercises: rolling performed x B side as pt soiled in bed. Linen replaced with multiple rolling with min/mod assist. +2 for sliding up towards HOB ? ?  ?  General Comments   ?  ?  ? ?Pertinent Vitals/Pain Pain Assessment ?Pain Assessment: No/denies pain  ? ? ?Home Living   ?  ?  ?  ?  ?  ?  ?  ?  ?  ?   ?  ?Prior Function    ?  ?  ?   ? ?PT Goals (current goals can now be found in the care plan section) Acute Rehab PT Goals ?Patient Stated Goal: family wants patient to be able to walk, patient and family want her to return home ?PT Goal Formulation: With patient ?Time For Goal Achievement: 03/26/22 ?Potential to Achieve Goals: Fair ?Progress towards PT goals: Progressing  toward goals ? ?  ?Frequency ? ? ? Min 2X/week ? ? ? ?  ?PT Plan Current plan remains appropriate  ? ? ?Co-evaluation PT/OT/SLP Co-Evaluation/Treatment: Yes ?Reason for Co-Treatment: Complexity of the patient's impairments (multi-system involvement);For patient/therapist safety;To address functional/ADL transfers ?PT goals addressed during session: Mobility/safety with mobility;Balance ?OT goals addressed during session: ADL's and self-care ?  ? ?  ?AM-PAC PT "6 Clicks" Mobility   ?Outcome Measure ? Help needed turning from your back to your side while in a flat bed without using bedrails?: A Lot ?Help needed moving from lying on your back to sitting on the side of a flat bed without using bedrails?: A Lot ?Help needed moving to and from a bed to a chair (including a wheelchair)?: Total ?Help needed standing up from a chair using your arms (e.g., wheelchair or bedside chair)?: Total ?Help needed to walk in hospital room?: Total ?Help needed climbing 3-5 steps with a railing? : Total ?6 Click Score: 8 ? ?  ?End of Session   ?Activity Tolerance: Patient limited by fatigue (nausea) ?Patient left: in bed ?Nurse Communication: Mobility status ?PT Visit Diagnosis: History of falling (Z91.81);Muscle weakness (generalized) (M62.81) ?  ? ? ?Time: 5035-4656 ?PT Time Calculation (min) (ACUTE ONLY): 23 min ? ?Charges:  $Therapeutic Activity: 8-22 mins          ?          ? ?Elizabeth Palau, PT, DPT, GCS ?[phone] ? ? ? ?Tiffany [name] ?03/14/2022, 4:57 PM ? ?

## 2022-03-14 NOTE — TOC Progression Note (Signed)
Transition of Care (TOC) - Progression Note  ? ? ?Patient Details  ?Name: Tiffany [name] ?MRN: [id number] ?Date of Birth: 1961/07/05 ? ?Transition of Care (TOC) CM/SW Contact  ?Beverly Sessions, RN ?Phone Number: ?03/14/2022, 2:26 PM ? ?Clinical Narrative:    ? ? ? ?  ? Met with at bedside.  Again confirms she declines SNF and will return to [facility] at discharge  ? ?Expected Discharge Plan and Services ?  ?  ?  ?  ?  ?                ?  ?  ?  ?  ?  ?  ?  ?  ?  ?  ? ? ?Social Determinants of Health (SDOH) Interventions ?  ? ?Readmission Risk Interventions ?Readmission Risk Prevention Plan 11/08/2021  ?Transportation Screening Complete  ?PCP or Specialist Appt within 5-7 Days Complete  ?Home Care Screening Complete  ?Medication Review (RN CM) Complete  ?Some recent data might be hidden  ? ? ?

## 2022-03-15 LAB — CBC WITH DIFFERENTIAL/PLATELET
Abs Immature Granulocytes: 0.02 10*3/uL (ref 0.00–0.07)
Basophils Absolute: 0 10*3/uL (ref 0.0–0.1)
Basophils Relative: 1 %
Eosinophils Absolute: 0.1 10*3/uL (ref 0.0–0.5)
Eosinophils Relative: 3 %
HCT: 25.1 % — ABNORMAL LOW (ref 36.0–46.0)
Hemoglobin: 8.3 g/dL — ABNORMAL LOW (ref 12.0–15.0)
Immature Granulocytes: 1 %
Lymphocytes Relative: 43 %
Lymphs Abs: 1.7 10*3/uL (ref 0.7–4.0)
MCH: 27.1 pg (ref 26.0–34.0)
MCHC: 33.1 g/dL (ref 30.0–36.0)
MCV: 82 fL (ref 80.0–100.0)
Monocytes Absolute: 0.6 10*3/uL (ref 0.1–1.0)
Monocytes Relative: 15 %
Neutro Abs: 1.4 10*3/uL — ABNORMAL LOW (ref 1.7–7.7)
Neutrophils Relative %: 37 %
Platelets: 190 10*3/uL (ref 150–400)
RBC: 3.06 MIL/uL — ABNORMAL LOW (ref 3.87–5.11)
RDW: 16.1 % — ABNORMAL HIGH (ref 11.5–15.5)
WBC: 3.8 10*3/uL — ABNORMAL LOW (ref 4.0–10.5)
nRBC: 0.5 % — ABNORMAL HIGH (ref 0.0–0.2)

## 2022-03-15 LAB — GLUCOSE, CAPILLARY: Glucose-Capillary: 84 mg/dL (ref 70–99)

## 2022-03-15 LAB — BASIC METABOLIC PANEL
Anion gap: 5 (ref 5–15)
BUN: <5 mg/dL — ABNORMAL LOW (ref 6–20)
CO2: 30 mmol/L (ref 22–32)
Calcium: 8 mg/dL — ABNORMAL LOW (ref 8.9–10.3)
Chloride: 97 mmol/L — ABNORMAL LOW (ref 98–111)
Creatinine, Ser: 0.47 mg/dL (ref 0.44–1.00)
GFR, Estimated: >60 mL/min (ref >60)
Glucose, Bld: 90 mg/dL (ref 70–99)
Potassium: 4 mmol/L (ref 3.5–5.1)
Sodium: 132 mmol/L — ABNORMAL LOW (ref 135–145)

## 2022-03-15 MED ORDER — OSMOLITE 1.2 CAL PO LIQD: 1000.0000 mL | ORAL | Status: DC

## 2022-03-15 NOTE — Progress Notes (Signed)
? ? ? ?Progress Note  ? ? ?Tiffany [name]  [id number] DOB: 1961/10/27  DOA: 03/07/2022 ?PCP: Eddie DibblesAllison, James H, MD  ? ? ? ? ?Brief Narrative:  ? ? ?Medical records reviewed and are as summarized below: ? ?Tiffany [name] is a 61 y.o. female with medical history of gastritis, obesity, dyslipidemia, GERD, hyperlipidemia, frequent nausea and vomiting, hypokalemia, hiatal hernia, bipolar disorder, who presented to the hospital because of nausea and vomiting.  She has been admitted multiple times for similar complaints.  The exact etiology of nausea and vomiting is unclear.  She had recent EGD and abdominal imaging which did not identify the cause.  There is a questionable history of gastroparesis. ? ? ? ? ? ?Assessment/Plan:  ? ?Principal Problem: ?  Intractable nausea and vomiting ?Active Problems: ?  GERD (gastroesophageal reflux disease) ?  Primary hypertension ?  Bipolar 1 disorder (HCC) ?  Asthma, chronic ?  Dyslipidemia ?  HLD (hyperlipidemia) ?  COPD (chronic obstructive pulmonary disease) (HCC) ?  Migraine ?  Depression ?  Hypokalemia due to excessive gastrointestinal loss of potassium ? ? ?Nutrition Problem: Inadequate oral intake ?Etiology: decreased appetite, nausea ? ?Signs/Symptoms: meal completion < 25%, per patient/family report ? ? ?Body mass index is 36.79 kg/m?.  (Obesity) ? ?Intractable nausea and vomiting, recurrent, suspected gastroparesis: Etiology is unclear.  EGD and abdominal imaging have been unrevealing.  Continue antiemetics.  GI has signed off. ? ?Hypokalemia: Improved.  Continue to monitor levels. ? ?Poor oral intake, weight loss: Discussed enteral nutrition via nasogastric tube.  She was reluctant to try at first.  She agreed to have NG tube placed.  Nursing staff attempted to place NG tube but she refused and said she could not tolerate it. ? ?History of stroke, paraparesis: Patient said she has not been able to ambulate since November 2022.  This was confirmed by her fianc? at the  bedside. ? ?COPD: Continue Spiriva ? ?History of bipolar disorder: No acute issues.  Patient denies any history of depression and does not want to continue Cymbalta and trazodone.  She said Cymbalta and trazodone were prescribed many years ago and she has not taken them for over 3 years.  [place] and she said she has not used these medicines since she moved from [place]. ? ?Other complications include chronic anemia (iron and B12 levels were normal), GERD, hyperlipidemia, distal esophageal diverticulum ? ? ? ?Diet Order   ? ?       ?  DIET DYS 2 Room service appropriate? Yes; Fluid consistency: Thin  Diet effective now       ?  ? ?  ?  ? ?  ? ? ? ? ?Consultants: ?Gastroenterologist ? ?Procedures: ?None ? ? ? ?Medications:  ? ? vitamin C  500 mg Oral BID  ? atorvastatin  80 mg Oral Daily  ? budesonide  0.25 mg Nebulization BID  ? enoxaparin (LOVENOX) injection  55 mg Subcutaneous QHS  ? famotidine  20 mg Oral BID  ? folic acid  5 mg Oral Daily  ? haloperidol lactate  1 mg Intravenous Q6H  ? metoCLOPramide (REGLAN) injection  10 mg Intravenous Q8H  ? multivitamin with minerals  1 tablet Oral Daily  ? pantoprazole (PROTONIX) IV  40 mg Intravenous BID  ? tiotropium  18 mcg Inhalation QHS  ? zinc sulfate  220 mg Oral Daily  ? ?Continuous Infusions: ? sodium chloride Stopped (03/11/22  9622)  ? feeding supplement (OSMOLITE 1.2 CAL) Stopped (03/15/22 1047)  ? promethazine (PHENERGAN) injection (IM or IVPB) 12.5 mg (03/14/22 1638)  ? ? ? ?Anti-infectives (From admission, onward)  ? ? Start     Dose/Rate Route Frequency Ordered Stop  ? 03/09/22 1030  erythromycin 340 mg in sodium chloride 0.9 % 100 mL IVPB  Status:  Discontinued       ? 3 mg/kg ? 113 kg ?100 mL/hr over 60 Minutes Intravenous Every 8 hours 03/09/22 2979 03/13/22 0908  ? ?  ? ? ? ? ? ? ? ? ? ?Family Communication/Anticipated D/C date and plan/Code Status  ? ?DVT prophylaxis: Place TED hose Start: 03/07/22  1854 ? ? ?  Code Status: Full Code ? ?Family Communication: Plan discussed with her fianc? at the bedside ?Disposition Plan: Plan to discharge to SNF ? ? ?Status is: Inpatient ?Remains inpatient appropriate because: Generalized weakness, intractable nausea and vomiting, poor oral intake ? ? ? ? ? ? ?Subjective:  ? ?She still complains of nausea and vomiting.  She said she vomited about 3 times last night.  She said she could not even eat ice cream.  She still has nausea this morning but has not vomited this morning.  Her fianc? was at the bedside. ? ?Objective:  ? ? ?Vitals:  ? 03/15/22 0444 03/15/22 0742 03/15/22 0757 03/15/22 1517  ?BP: 130/74  (!) 143/79 135/73  ?Pulse: 83  89 93  ?Resp: 16  18 18   ?Temp: 97.9 ?F (36.6 ?C)  98 ?F (36.7 ?C) 98.3 ?F (36.8 ?C)  ?TempSrc: Oral     ?SpO2: 100% 94% 98% 98%  ?Weight:      ?Height:      ? ?No data found. ? ? ?Intake/Output Summary (Last 24 hours) at 03/15/2022 1634 ?Last data filed at 03/14/2022 1701 ?Gross per 24 hour  ?Intake 434.02 ml  ?Output --  ?Net 434.02 ml  ? ?Filed Weights  ? 03/07/22 1306  ?Weight: 113 kg  ? ? ?Exam: ? ?GEN: NAD ?SKIN: Warm and dry.  Stage II right and left buttock decubitus ulcers ?EYES: EOMI ?ENT: MMM ?CV: RRR ?PULM: CTA B ?ABD: soft, obese, NT, +BS ?CNS: AAO x 3, power in bilateral upper extremities is 4/5) bilateral lower extremities 3/5 ?EXT: No edema or tenderness ? ? ? ? ?Data Reviewed:  ? ?I have personally reviewed following labs and imaging studies: ? ?Labs: ?Labs show the following:  ? ?Basic Metabolic Panel: ?Recent Labs  ?Lab 03/09/22 ?0855 03/10/22 ?0748 03/11/22 ?05/11/22 03/12/22 ?03/14/22 03/13/22 ?0450 03/14/22 ?03/16/22 03/15/22 ?0510  ?NA 134*   < > 135 134* 135 134* 132*  ?K 3.0*   < > 3.0* 2.9* 3.2* 3.0* 4.0  ?CL 96*   < > 100 99 98 97* 97*  ?CO2 28   < > 28 30 31  32 30  ?GLUCOSE 70   < > 88 97 96 89 90  ?BUN <5*   < > <5* <5* <5* <5* <5*  ?CREATININE 0.41*   < > 0.52 0.59 0.50 0.51 0.47  ?CALCIUM 7.7*   < > 7.8* 7.6* 7.6* 7.7* 8.0*   ?MG 1.6*  --   --   --  1.7 2.0  --   ?PHOS  --   --   --   --   --  2.3*  --   ? < > = values in this interval not displayed.  ? ?GFR ?Estimated Creatinine Clearance: 101.5 mL/min (by C-G formula based  on SCr of 0.47 mg/dL). ?Liver Function Tests: ?No results for input(s): AST, ALT, ALKPHOS, BILITOT, PROT, ALBUMIN in the last 168 hours. ? ?No results for input(s): LIPASE, AMYLASE in the last 168 hours. ?No results for input(s): AMMONIA in the last 168 hours. ?Coagulation profile ?No results for input(s): INR, PROTIME in the last 168 hours. ? ?CBC: ?Recent Labs  ?Lab 03/10/22 ?0748 03/11/22 ?0811 03/12/22 ?0859 03/15/22 ?0510  ?WBC 4.0 3.3* 3.0* 3.8*  ?NEUTROABS 2.0 1.4* 1.1* 1.4*  ?HGB 7.9* 7.9* 7.5* 8.3*  ?HCT 23.7* 23.4* 22.8* 25.1*  ?MCV 81.4 82.4 82.3 82.0  ?PLT 188 186 182 190  ? ?Cardiac Enzymes: ?No results for input(s): CKTOTAL, CKMB, CKMBINDEX, TROPONINI in the last 168 hours. ?BNP (last 3 results) ?No results for input(s): PROBNP in the last 8760 hours. ?CBG: ?No results for input(s): GLUCAP in the last 168 hours. ?D-Dimer: ?No results for input(s): DDIMER in the last 72 hours. ?Hgb A1c: ?No results for input(s): HGBA1C in the last 72 hours. ?Lipid Profile: ?No results for input(s): CHOL, HDL, LDLCALC, TRIG, CHOLHDL, LDLDIRECT in the last 72 hours. ?Thyroid function studies: ?No results for input(s): TSH, T4TOTAL, T3FREE, THYROIDAB in the last 72 hours. ? ?Invalid input(s): FREET3 ?Anemia work up: ?No results for input(s): VITAMINB12, FOLATE, FERRITIN, TIBC, IRON, RETICCTPCT in the last 72 hours. ?Sepsis Labs: ?Recent Labs  ?Lab 03/10/22 ?0748 03/11/22 ?0811 03/12/22 ?0859 03/15/22 ?0510  ?WBC 4.0 3.3* 3.0* 3.8*  ? ? ?Microbiology ?No results found for this or any previous visit (from the past 240 hour(s)). ? ?Procedures and diagnostic studies: ? ?No results found. ? ? ? ? ? ? ? ? ? ? ? ? LOS: 7 days  ? ?Tiffany [name]  ?[facility]  ? ?Pager on [email or web address]. If 7PM-7AM, please contact  night-coverage at [email or web address] ? ? ? ? ?03/15/2022, 4:34 PM  ? ? ? ? ? ? ? ? ? ?

## 2022-03-15 NOTE — Progress Notes (Signed)
Pt refused nasogastric tube insertion ?

## 2022-03-15 NOTE — Progress Notes (Signed)
Nutrition Follow-up ? ?DOCUMENTATION CODES:  ? ?Obesity unspecified ? ?INTERVENTION:  ? ?-If pt desire aggressive measures, consider enteral nutrition support (pt may benefit from permanent post-pyloric tube secondary to gastroparesis, ex surgical j-tube or G-J tube):  ?  ?Initiate Osmolite 1.5 @ 25 ml/hr and increase by 10 ml every 12 hours to goal rate of 55 ml/hr.  ?  ?45 ml Prosource TF TID.   ?  ?150 ml free water flush every 4 hours ?  ?Tube feeding regimen provides 2100 kcal (100% of needs), 116 grams of protein, and 1006 ml of H2O.  Total free water: 1906 ml daily ? ?NUTRITION DIAGNOSIS:  ? ?Inadequate oral intake related to decreased appetite, nausea as evidenced by meal completion < 25%, per patient/family report. ? ?Ongoing ? ?GOAL:  ? ?Patient will meet greater than or equal to 90% of their needs ? ?Unmet ? ?MONITOR:  ? ?PO intake, Supplement acceptance, Labs, Weight trends, Skin, I & O's ? ?REASON FOR ASSESSMENT:  ? ?Malnutrition Screening Tool ?  ? ?ASSESSMENT:  ? ?61 year female with history of gastritis, obesity, dyslipidemia, GERD, hyperlipidemia, frequent nausea and vomiting, hypokalemia, hiatal hernia, bipolar, who presents emergency department for chief concerns of nausea and vomiting. ? ?Reviewed I/O's: +434 ml x 24 hours and -2.5 L since admission  ? ?Pt refusing PO medications. Intake still remains poor and pt continues to refuse oral nutrition supplements.  ? ?Case discussed with MD and RN. Pt refused bedside feeding tube placement. Clarified with MD that he desired temporary feeding tube placement at this time. Pt may benefit from permanent feeding tube access (ex G-J tube or surgical j-tube placement). Given pt's consistent poor oral intake and significant weight loss (27% over the past 3 months) along with recurrent hospitalizations for same issues, permanent access may be ideal, especially if plan is to discharge pt back to home environment.  ? ?Medications reviewed sodium chloride,  vitamin C, reglan, and zinc sulfate.  ? ?Labs reviewed: Na: 132.   ? ?Diet Order:   ?Diet Order   ? ?       ?  DIET DYS 2 Room service appropriate? Yes; Fluid consistency: Thin  Diet effective now       ?  ? ?  ?  ? ?  ? ? ?EDUCATION NEEDS:  ? ?Not appropriate for education at this time ? ?Skin:  Skin Integrity Issues:: Stage II ?Stage II: lt and rt buttocks ? ?Last BM:  03/10/22 ? ?Height:  ? ?Ht Readings from Last 1 Encounters:  ?03/07/22 5\' 9"  (1.753 m)  ? ? ?Weight:  ? ?Wt Readings from Last 1 Encounters:  ?03/07/22 113 kg  ? ? ?Ideal Body Weight:  65.9 kg ? ?BMI:  Body mass index is 36.79 kg/m?. ? ?Estimated Nutritional Needs:  ? ?Kcal:  1950-2150 ? ?Protein:  115-130 grams ? ?Fluid:  >1.9 L ? ? ? ?05/07/22, RD, LDN, CDCES ?Registered Dietitian II ?Certified Diabetes Care and Education Specialist ?Please refer to [facility] for RD and/or RD on-call/weekend/after hours pager  ?

## 2022-03-16 DIAGNOSIS — E876 Hypokalemia: Secondary | ICD-10-CM

## 2022-03-16 LAB — BASIC METABOLIC PANEL
Anion gap: 9 (ref 5–15)
BUN: 7 mg/dL (ref 6–20)
CO2: 30 mmol/L (ref 22–32)
Calcium: 8.1 mg/dL — ABNORMAL LOW (ref 8.9–10.3)
Chloride: 92 mmol/L — ABNORMAL LOW (ref 98–111)
Creatinine, Ser: 0.47 mg/dL (ref 0.44–1.00)
GFR, Estimated: >60 mL/min (ref >60)
Glucose, Bld: 89 mg/dL (ref 70–99)
Potassium: 3.5 mmol/L (ref 3.5–5.1)
Sodium: 131 mmol/L — ABNORMAL LOW (ref 135–145)

## 2022-03-16 LAB — GLUCOSE, CAPILLARY
Glucose-Capillary: 81 mg/dL (ref 70–99)
Glucose-Capillary: 85 mg/dL (ref 70–99)
Glucose-Capillary: 86 mg/dL (ref 70–99)
Glucose-Capillary: 90 mg/dL (ref 70–99)
Glucose-Capillary: 96 mg/dL (ref 70–99)

## 2022-03-16 NOTE — Progress Notes (Signed)
PT Cancellation Note ? ?Patient Details ?Name: TASHAUNA CAISSE ?MRN: [id number] ?DOB: 1961-11-12 ? ? ?Cancelled Treatment:    Reason Eval/Treat Not Completed: Patient declined, no reason specified Due to patient sleeping upon arrival, refusing to open eyes, and shaking her head that she does not want to participate in PT right now despite max encouragement, will re-attempt at a later time/date as available. Thank you! ? ? ?Angelica Ran, PT  ?03/16/22. 1:37 PM ? ?

## 2022-03-16 NOTE — Plan of Care (Signed)

## 2022-03-16 NOTE — Progress Notes (Signed)
Patient was discharged via EMS with all belongings. Discharge instructions given to Patient's caregiver Eddie. Eddie expressed understanding discharge instructions and medication regimen. Discharge supplies given to EMS. VSS AT discharge. PIVX2 removed with catheter intact. No complications with discharge. ?

## 2022-03-16 NOTE — Discharge Summary (Incomplete)
? ?Physician Discharge Summary  ?Tiffany [name] [id number] DOB: June 29, 1961 DOA: 03/07/2022 ? ?PCP: Nelle Don, MD ? ?Admit date: 03/07/2022 ?Discharge date: 03/16/2022 ? ?Discharge disposition: *** ? ? ?Recommendations for Outpatient Follow-Up:  ? ?*** (include homehealth, outpatient follow-up instructions, specific recommendations for PCP to follow-up on, etc.) ?LACE score = ***, corresponding to a *** % of re-admission within 30 days.   (http://tools.farmacologiaclinica.info/index.php?sid=10044) ? ? ?Discharge Diagnosis:  ? ?Principal Problem: ?  Intractable nausea and vomiting ?Active Problems: ?  GERD (gastroesophageal reflux disease) ?  Primary hypertension ?  Bipolar 1 disorder ([place]) ?  Asthma, chronic ?  Dyslipidemia ?  HLD (hyperlipidemia) ?  COPD (chronic obstructive pulmonary disease) ([place]) ?  Migraine ?  Depression ?  Hypokalemia due to excessive gastrointestinal loss of potassium ? ? ? ?Discharge Condition: Stable. ? ?Diet recommendation:  ?Diet Order   ? ?       ?  Diet - low sodium heart healthy       ?  ?  DIET DYS 2 Room service appropriate? Yes; Fluid consistency: Thin  Diet effective now       ?  ? ?  ?  ? ?  ? ? ?  Code Status: Full Code ? ? ? ? ?Hospital Course:  ? ?*** ?She refuses to go to SNF.  She [facility] to [facility] back [facility]. ? ?Discharge plan was discussed with Cathe Mons and POA) over the phone.  Discharge plan was also discussed with patient and her significant other/fianc? at the bedside. ? ?Medical Consultants:  ? ?*** ? ? ?Discharge Exam:  ? ? ?Vitals:  ? 03/15/22 1517 03/15/22 2003 03/15/22 2004 03/16/22 0414  ?BP: 135/73 136/78  114/69  ?Pulse: 93 87 87 85  ?Resp: 18 16 16 16   ?Temp: 98.3 ?F (36.8 ?C) 98.5 ?F (36.9 ?C)  98.5 ?F (36.9 ?C)  ?TempSrc:      ?SpO2: 98% 98% 98% 90%  ?Weight:      ?Height:      ? ? ? ?GEN: NAD ?SKIN: No rash*** ?EYES: EOMI*** ?ENT: MMM ?CV: RRR ?PULM: CTA B ?ABD: soft, obese, NT, +BS ?CNS: AAO x 3, power in bilateral upper  extremities is 4/5, bilateral lower extremities is 3/5 ?EXT: No edema or tenderness*** ? ? ?The results of significant diagnostics from this hospitalization (including imaging, microbiology, ancillary and laboratory) are listed below for reference.   ? ? ?Procedures and Diagnostic Studies:  ? ?MR BRAIN W WO CONTRAST ? ?Result Date: 03/08/2022 ?CLINICAL DATA:  Neuro deficit, acute, stroke suspected. EXAM: MRI HEAD WITHOUT AND WITH CONTRAST TECHNIQUE: Multiplanar, multiecho pulse sequences of the brain and surrounding structures were obtained without and with intravenous contrast. CONTRAST:  44mL GADAVIST GADOBUTROL 1 MMOL/ML IV SOLN COMPARISON:  Prior head CT examinations 11/13/2021 and earlier. FINDINGS: Brain: Cerebral volume appears within normal limits for age. Redemonstrated "empty" and somewhat expanded sella turcica. No significant cerebral white matter disease for age. No cortical encephalomalacia is identified. There is no acute infarct. No evidence of an intracranial mass. No chronic intracranial blood products. No extra-axial fluid collection. No midline shift. No pathologic intracranial enhancement identified. Vascular: Maintained flow voids within the proximal large arterial vessels. 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 Skull and upper cervical spine: No focal suspicious marrow lesion Sinuses/Orbits: Bilateral proptosis. Trace mucosal thickening within the bilateral ethmoid sinuses. Other: Small-volume fluid within the bilateral mastoid air cells. IMPRESSION: No evidence of acute intracranial abnormality. Redemonstrated "empty" and  somewhat expanded sella turcica. While these findings can reflect incidental anatomic variation, they can also be associated with idiopathic intracranial hypertension (pseudotumor cerebri). Otherwise unremarkable MRI appearance of the brain for age. Bilateral proptosis. Small-volume fluid within the bilateral mastoid air cells. Electronically  Signed   By: Kellie Simmering D.O.   On: 03/08/2022 14:46  ? ? ? ?Labs:  ? ?Basic Metabolic Panel: ?Recent Labs  ?Lab 03/12/22 ?0859 03/13/22 ?0450 03/14/22 ?[id number] 03/15/22 ?0510 03/16/22 ?[id number]  ?NA 134* 135 134* 132* 131*  ?K 2.9* 3.2* 3.0* 4.0 3.5  ?CL 99 98 97* 97* 92*  ?CO2 30 31 32 30 30  ?GLUCOSE 97 96 89 90 89  ?BUN <5* <5* <5* <5* 7  ?CREATININE 0.59 0.50 0.51 0.47 0.47  ?CALCIUM 7.6* 7.6* 7.7* 8.0* 8.1*  ?MG  --  1.7 2.0  --   --   ?PHOS  --   --  2.3*  --   --   ? ?GFR ?Estimated Creatinine Clearance: 101.5 mL/min (by C-G formula based on SCr of 0.47 mg/dL). ?Liver Function Tests: ?No results for input(s): AST, ALT, ALKPHOS, BILITOT, PROT, ALBUMIN in the last 168 hours. ?No results for input(s): LIPASE, AMYLASE in the last 168 hours. ?No results for input(s): AMMONIA in the last 168 hours. ?Coagulation profile ?No results for input(s): INR, PROTIME in the last 168 hours. ? ?CBC: ?Recent Labs  ?Lab 03/10/22 ?0748 03/11/22 ?0811 03/12/22 ?0859 03/15/22 ?0510  ?WBC 4.0 3.3* 3.0* 3.8*  ?NEUTROABS 2.0 1.4* 1.1* 1.4*  ?HGB 7.9* 7.9* 7.5* 8.3*  ?HCT 23.7* 23.4* 22.8* 25.1*  ?MCV 81.4 82.4 82.3 82.0  ?PLT 188 186 182 190  ? ?Cardiac Enzymes: ?No results for input(s): CKTOTAL, CKMB, CKMBINDEX, TROPONINI in the last 168 hours. ?BNP: ?Invalid input(s): POCBNP ?CBG: ?Recent Labs  ?Lab 03/15/22 ?2000 03/16/22 ?0003 03/16/22 ?0411 03/16/22 ?[id number] 03/16/22 ?1207  ?GLUCAP 84 86 90 85 96  ? ?D-Dimer ?No results for input(s): DDIMER in the last 72 hours. ?Hgb A1c ?No results for input(s): HGBA1C in the last 72 hours. ?Lipid Profile ?No results for input(s): CHOL, HDL, LDLCALC, TRIG, CHOLHDL, LDLDIRECT in the last 72 hours. ?Thyroid function studies ?No results for input(s): TSH, T4TOTAL, T3FREE, THYROIDAB in the last 72 hours. ? ?Invalid input(s): FREET3 ?Anemia work up ?No results for input(s): VITAMINB12, FOLATE, FERRITIN, TIBC, IRON, RETICCTPCT in the last 72 hours. ?Microbiology ?No results found for this or any previous  visit (from the past 240 hour(s)). ? ? ?Discharge Instructions:  ? ?Discharge Instructions   ? ? Diet - low sodium heart healthy   Complete by: As directed ?  ? Discharge wound care:   Complete by: As directed ?  ? Apply Mepilex border to stage II left and right buttock decubitus ulcers.  Avoid sitting or laying on your buttocks for too long.  ? Increase activity slowly   Complete by: As directed ?  ? ?  ? ?Allergies as of 03/16/2022   ? ?   Reactions  ? Dairy Aid [tilactase] Other (See Comments)  ? Exacerbates asthma  ? Egg [eggs Or Egg-derived Products] Other (See Comments)  ? Exacerbated asthma  ? Milk-related Compounds   ? ?  ? ?  ?Medication List  ?  ? ?STOP taking these medications   ? ?acetaminophen 325 MG tablet ?Commonly known as: TYLENOL ?  ?atorvastatin 80 MG tablet ?Commonly known as: LIPITOR ?  ?calcium-vitamin D 500-5 MG-MCG tablet ?Commonly known as: OSCAL WITH D ?  ?DULoxetine 30 MG  capsule ?Commonly known as: CYMBALTA ?  ?famotidine 20 MG tablet ?Commonly known as: PEPCID ?  ?folic acid 1 MG tablet ?Commonly known as: FOLVITE ?  ?loratadine 10 MG tablet ?Commonly known as: CLARITIN ?  ?pantoprazole 40 MG tablet ?Commonly known as: PROTONIX ?  ?traZODone 100 MG tablet ?Commonly known as: DESYREL ?  ? ?  ? ?TAKE these medications   ? ?diclofenac Sodium 1 % Gel ?Commonly known as: VOLTAREN ?Apply 4 g topically 4 (four) times daily. ?  ?diphenhydrAMINE 12.5 MG/5ML elixir ?Commonly known as: BENADRYL ?Take 10 mLs (25 mg total) by mouth 4 (four) times daily as needed. ?  ?feeding supplement (KATE FARMS STANDARD 1.4) Liqd liquid ?Take 325 mLs by mouth 3 (three) times daily between meals. ?  ?Flovent HFA 110 MCG/ACT inhaler ?Generic drug: fluticasone ?Inhale 2 puffs into the lungs 2 (two) times daily. ?  ?multivitamin with minerals Tabs tablet ?Take 1 tablet by mouth daily. ?  ?ondansetron 4 MG disintegrating tablet ?Commonly known as: ZOFRAN-ODT ?Take 1 tablet (4 mg total) by mouth every 8 (eight) hours as  needed for nausea or vomiting. ?  ?polyethylene glycol 17 g packet ?Commonly known as: MIRALAX / GLYCOLAX ?Take 17 g by mouth 2 (two) times daily as needed. ?  ?Spiriva HandiHaler 18 MCG inhalation capsule ?Gen

## 2022-03-16 NOTE — Plan of Care (Signed)
?  Problem: Education: ?Goal: Knowledge of General Education information will improve ?Description: Including pain rating scale, medication(s)/side effects and non-pharmacologic comfort measures ?03/16/2022 1627 by Evelena Peat, RN ?Outcome: Completed/Met ?03/16/2022 0950 by Evelena Peat, RN ?Outcome: Progressing ?  ?Problem: Health Behavior/Discharge Planning: ?Goal: Ability to manage health-related needs will improve ?03/16/2022 1627 by Evelena Peat, RN ?Outcome: Completed/Met ?03/16/2022 0950 by Evelena Peat, RN ?Outcome: Progressing ?  ?Problem: Clinical Measurements: ?Goal: Ability to maintain clinical measurements within normal limits will improve ?03/16/2022 1627 by Evelena Peat, RN ?Outcome: Completed/Met ?03/16/2022 0950 by Evelena Peat, RN ?Outcome: Progressing ?Goal: Will remain free from infection ?03/16/2022 1627 by Evelena Peat, RN ?Outcome: Completed/Met ?03/16/2022 0950 by Evelena Peat, RN ?Outcome: Progressing ?Goal: Diagnostic test results will improve ?03/16/2022 1627 by Evelena Peat, RN ?Outcome: Completed/Met ?03/16/2022 0950 by Evelena Peat, RN ?Outcome: Progressing ?Goal: Respiratory complications will improve ?03/16/2022 1627 by Evelena Peat, RN ?Outcome: Completed/Met ?03/16/2022 0950 by Evelena Peat, RN ?Outcome: Progressing ?Goal: Cardiovascular complication will be avoided ?03/16/2022 1627 by Evelena Peat, RN ?Outcome: Completed/Met ?03/16/2022 0950 by Evelena Peat, RN ?Outcome: Progressing ?  ?Problem: Activity: ?Goal: Risk for activity intolerance will decrease ?03/16/2022 1627 by Evelena Peat, RN ?Outcome: Completed/Met ?03/16/2022 0950 by Evelena Peat, RN ?Outcome: Progressing ?  ?Problem: Nutrition: ?Goal: Adequate nutrition will be maintained ?03/16/2022 1627 by Evelena Peat, RN ?Outcome: Completed/Met ?03/16/2022 0950 by Evelena Peat, RN ?Outcome: Progressing ?  ?Problem: Coping: ?Goal: Level  of anxiety will decrease ?03/16/2022 1627 by Evelena Peat, RN ?Outcome: Completed/Met ?03/16/2022 0950 by Evelena Peat, RN ?Outcome: Progressing ?  ?Problem: Elimination: ?Goal: Will not experience complications related to bowel motility ?03/16/2022 1627 by Evelena Peat, RN ?Outcome: Completed/Met ?03/16/2022 0950 by Evelena Peat, RN ?Outcome: Progressing ?Goal: Will not experience complications related to urinary retention ?03/16/2022 1627 by Evelena Peat, RN ?Outcome: Completed/Met ?03/16/2022 0950 by Evelena Peat, RN ?Outcome: Progressing ?  ?Problem: Pain Managment: ?Goal: General experience of comfort will improve ?03/16/2022 1627 by Evelena Peat, RN ?Outcome: Completed/Met ?03/16/2022 0950 by Evelena Peat, RN ?Outcome: Progressing ?  ?Problem: Safety: ?Goal: Ability to remain free from injury will improve ?03/16/2022 1627 by Evelena Peat, RN ?Outcome: Completed/Met ?03/16/2022 0950 by Evelena Peat, RN ?Outcome: Progressing ?  ?Problem: Skin Integrity: ?Goal: Risk for impaired skin integrity will decrease ?03/16/2022 1627 by Evelena Peat, RN ?Outcome: Completed/Met ?03/16/2022 0950 by Evelena Peat, RN ?Outcome: Progressing ?  ?Problem: Inadequate Intake (NI-2.1) ?Goal: Food and/or nutrient delivery ?Description: Individualized approach for food/nutrient provision. ?Outcome: Completed/Met ?  ?Problem: Acute Rehab PT Goals(only PT should resolve) ?Goal: Pt will Roll Supine to Side ?Outcome: Completed/Met ?Goal: Pt Will Go Supine/Side To Sit ?Outcome: Completed/Met ?Goal: Pt Will Go Sit To Supine/Side ?Outcome: Completed/Met ?  ?Problem: Acute Rehab OT Goals (only OT should resolve) ?Goal: Pt. Will Perform Upper Body Dressing ?Outcome: Completed/Met ?Goal: Pt/Caregiver Will Perform Home Exercise Program ?Outcome: Completed/Met ?Goal: OT Additional ADL Goal #1 ?Outcome: Completed/Met ?  ?

## 2022-03-16 NOTE — TOC Transition Note (Addendum)
Transition of Care (TOC) - CM/SW Discharge Note ? ? ?Patient Details  ?Name: Tiffany [name] ?MRN: [id number] ?Date of Birth: 12/12/1961 ? ?Transition of Care (TOC) CM/SW Contact:  ?Marlie Kuennen [name], LCSW ?Phone Number: ?03/16/2022, 2:25 PM ? ? ?Clinical Narrative:   Patient to DC back to [facility] today.  ?EMS paperwork completed. ?Will call for EMS when notified by RN that patient is ready. Per RN, patient's family member was going to get their room at [facility]. ? ?3:45- Checked with RN who is ready for transport.  ?Called for [facility], patient is next on the list. RN notified. ? ?Final next level of care: Other (comment) (hotel) ?Barriers to Discharge: Barriers Resolved ? ? ?Patient Goals and CMS Choice ?  ?  ?  ? ?Discharge Placement ?  ?           ?  ?Patient to be transferred to facility by: [facility] ?  ?  ? ?Discharge Plan and Services ?  ?  ?           ?  ?  ?  ?  ?  ?  ?  ?  ?  ?  ? ?Social Determinants of Health (SDOH) Interventions ?  ? ? ?Readmission Risk Interventions ?Readmission Risk Prevention Plan 11/08/2021  ?Transportation Screening Complete  ?PCP or Specialist Appt within 5-7 Days Complete  ?Home Care Screening Complete  ?Medication Review (RN CM) Complete  ?Some recent data might be hidden  ? ? ? ? ? ?

## 2022-03-17 NOTE — Discharge Summary (Signed)
?Physician Discharge Summary ?  ?Patient: Tiffany [name] MRN: [id number] DOB: 1961/04/04  ?Admit date:     03/07/2022  ?Discharge date: 03/16/2022  ?Discharge Physician: Jennye Boroughs  ? ?PCP: Nelle Don, MD  ? ?Recommendations at discharge:  ? ?Follow-up with PCP in 1 week ? ?Discharge Diagnoses: ?Principal Problem: ?  Intractable nausea and vomiting ?Active Problems: ?  GERD (gastroesophageal reflux disease) ?  Primary hypertension ?  Bipolar 1 disorder ([place]) ?  Asthma, chronic ?  Dyslipidemia ?  HLD (hyperlipidemia) ?  COPD (chronic obstructive pulmonary disease) ([place]) ?  Migraine ?  Hypokalemia due to excessive gastrointestinal loss of potassium ? ?Resolved Problems: ?  * No resolved hospital problems. * ? ?Hospital Course: ?  ?Tiffany [name] is a 61 y.o. female with medical history of gastritis, obesity, COPD, dyslipidemia, GERD, hyperlipidemia, frequent nausea and vomiting, hypokalemia, hiatal hernia, bipolar disorder, stroke, who presented to the hospital because of nausea and vomiting.  She has been admitted multiple times for similar complaints.  The exact etiology of nausea and vomiting is unclear.  She had recent EGD and abdominal imaging which did not identify the cause. There is a questionable history of gastroparesis.  She was treated with antiemetics and IV fluids. ?Gastroenterologist was consulted to assist with management.  However, given that patient has had recent EGD and imaging, GI decided against repeat EGD or imaging since it was unlikely to change management.  ? ?She had hypokalemia that was treated.  Adequate oral intake was encouraged and dietitian was consulted to assist with nutrition.  She was seen by PT and OT for generalized weakness and discharge to SNF was recommended.  However, patient declined to go to SNF.  Recurrent nausea and vomiting appears to be a chronic problem and close follow-up with PCP and GI was recommended for further management.  She is deemed stable for  discharge to home today.  Discharge plan was discussed with the patient and her fianc? at the bedside.  Discharge plan was also discussed with her brother, Maylon Cos.  ? ? ? ?  ? ? ?Consultants: Gastroenterologist ?Procedures performed: None ?Disposition: Home ?Diet recommendation:  ?Discharge Diet Orders (From admission, onward)  ? ?  Start     Ordered  ? 03/16/22 0000  Diet - low sodium heart healthy       ? 03/16/22 1330  ? ?  ?  ? ?  ? ?Regular diet ?DISCHARGE MEDICATION: ?Allergies as of 03/16/2022   ? ?   Reactions  ? Dairy Aid [tilactase] Other (See Comments)  ? Exacerbates asthma  ? Egg [eggs Or Egg-derived Products] Other (See Comments)  ? Exacerbated asthma  ? Milk-related Compounds   ? ?  ? ?  ?Medication List  ?  ? ?STOP taking these medications   ? ?acetaminophen 325 MG tablet ?Commonly known as: TYLENOL ?  ?atorvastatin 80 MG tablet ?Commonly known as: LIPITOR ?  ?calcium-vitamin D 500-5 MG-MCG tablet ?Commonly known as: OSCAL WITH D ?  ?DULoxetine 30 MG capsule ?Commonly known as: CYMBALTA ?  ?famotidine 20 MG tablet ?Commonly known as: PEPCID ?  ?folic acid 1 MG tablet ?Commonly known as: FOLVITE ?  ?loratadine 10 MG tablet ?Commonly known as: CLARITIN ?  ?pantoprazole 40 MG tablet ?Commonly known as: PROTONIX ?  ?traZODone 100 MG tablet ?Commonly known as: DESYREL ?  ? ?  ? ?TAKE these medications   ? ?diclofenac Sodium 1 % Gel ?Commonly known as: VOLTAREN ?Apply 4 g topically  4 (four) times daily. ?  ?diphenhydrAMINE 12.5 MG/5ML elixir ?Commonly known as: BENADRYL ?Take 10 mLs (25 mg total) by mouth 4 (four) times daily as needed. ?  ?feeding supplement (KATE FARMS STANDARD 1.4) Liqd liquid ?Take 325 mLs by mouth 3 (three) times daily between meals. ?  ?Flovent HFA 110 MCG/ACT inhaler ?Generic drug: fluticasone ?Inhale 2 puffs into the lungs 2 (two) times daily. ?  ?multivitamin with minerals Tabs tablet ?Take 1 tablet by mouth daily. ?  ?ondansetron 4 MG disintegrating tablet ?Commonly  known as: ZOFRAN-ODT ?Take 1 tablet (4 mg total) by mouth every 8 (eight) hours as needed for nausea or vomiting. ?  ?polyethylene glycol 17 g packet ?Commonly known as: MIRALAX / GLYCOLAX ?Take 17 g by mouth 2 (two) times daily as needed. ?  ?Spiriva HandiHaler 18 MCG inhalation capsule ?Generic drug: tiotropium ?1 capsule at bedtime. ?  ? ?  ? ?  ?  ? ? ?  ?Discharge Care Instructions  ?(From admission, onward)  ?  ? ? ?  ? ?  Start     Ordered  ? 03/16/22 0000  Discharge wound care:       ?Comments: Apply Mepilex border to stage II left and right buttock decubitus ulcers.  Avoid sitting or laying on your buttocks for too long.  ? 03/16/22 1330  ? ?  ?  ? ?  ? ? ?Discharge Exam: ?Danley Danker Weights  ? 03/07/22 1306  ?Weight: 113 kg  ? ?GEN: NAD ?SKIN: Warm and dry ?EYES: EOMI ?ENT: MMM ?CV: RRR ?PULM: CTA B ?ABD: soft, obese, NT, +BS ?CNS: AAO x 3, power in bilateral upper extremities is 4/5), power in bilateral lower extremities is 3/5 ?EXT: No edema or tenderness ? ? ?Condition at discharge: stable ? ?The results of significant diagnostics from this hospitalization (including imaging, microbiology, ancillary and laboratory) are listed below for reference.  ? ?Imaging Studies: ?CT Soft Tissue Neck W Contrast ? ?Result Date: 02/21/2022 ?CLINICAL DATA:  Provided history: Laryngeal edema. Personal history provided: Allergic reaction. Tongue pain. EXAM: CT NECK WITH CONTRAST TECHNIQUE: Multidetector CT imaging of the neck was performed using the standard protocol following the bolus administration of intravenous contrast. RADIATION DOSE REDUCTION: This exam was performed according to the departmental dose-optimization program which includes automated exposure control, adjustment of the mA and/or kV according to patient size and/or use of iterative reconstruction technique. CONTRAST:  53mL OMNIPAQUE IOHEXOL 300 MG/ML  SOLN COMPARISON:  CT cervical spine 10/29/2021. FINDINGS: Pharynx and larynx: Poor dentition with multiple  absent teeth. No swelling or discrete mass is appreciable within the oral cavity, pharynx or larynx by CT. Unremarkable appearance of the epiglottis. The imaged upper airway is patent. Postinflammatory calcifications/tonsilliths within the bilateral palatine tonsils. Salivary glands: No inflammation, mass, or stone. Thyroid: Unremarkable. Lymph nodes: No pathologically enlarged cervical chain lymph nodes. Vascular: The major vascular structures of the neck are patent. Partially retropharyngeal course of the left ICA. Limited intracranial: The intracranial contents are only minimally included in the field of view. No evidence of acute intracranial abnormality at the imaged levels. Visualized orbits: No orbital mass or acute orbital finding. Mastoids and visualized paranasal sinuses: Portions of the frontal and ethmoid sinuses are excluded from the field of view. No significant paranasal sinus disease or mastoid effusion at the imaged levels. Skeleton: No acute bony abnormality or aggressive osseous lesion. Straightening of the expected cervical lordosis. Cervical spondylosis. Upper chest: No consolidation within the imaged lung apices. IMPRESSION: No swelling or  discrete mass is appreciable within the oral cavity, pharynx or larynx by CT. The imaged upper airway is patent. Unremarkable appearance of the epiglottis. Postinflammatory calcifications/tonsilliths within the bilateral palatine tonsils. Cervical spondylosis. Electronically Signed   By: Kellie Simmering D.O.   On: 02/21/2022 14:45  ? ?MR BRAIN W WO CONTRAST ? ?Result Date: 03/08/2022 ?CLINICAL DATA:  Neuro deficit, acute, stroke suspected. EXAM: MRI HEAD WITHOUT AND WITH CONTRAST TECHNIQUE: Multiplanar, multiecho pulse sequences of the brain and surrounding structures were obtained without and with intravenous contrast. CONTRAST:  81mL GADAVIST GADOBUTROL 1 MMOL/ML IV SOLN COMPARISON:  Prior head CT examinations 11/13/2021 and earlier. FINDINGS: Brain: Cerebral  volume appears within normal limits for age. Redemonstrated "empty" and somewhat expanded sella turcica. No significant cerebral white matter disease for age. No cortical encephalomalacia is identified. There i

## 2022-03-27 ENCOUNTER — Emergency Department: Payer: Medicaid Other

## 2022-03-27 ENCOUNTER — Encounter: Payer: Self-pay | Admitting: Emergency Medicine

## 2022-03-27 ENCOUNTER — Inpatient Hospital Stay
Admission: EM | Admit: 2022-03-27 | Discharge: 2022-04-04 | DRG: 641 | Disposition: A | Discharge status: Skilled Nursing Facility | Payer: Medicaid Other | Attending: Internal Medicine | Admitting: Internal Medicine

## 2022-03-27 DIAGNOSIS — Z9889 Other specified postprocedural states: Secondary | ICD-10-CM

## 2022-03-27 DIAGNOSIS — E669 Obesity, unspecified: Secondary | ICD-10-CM | POA: Diagnosis present

## 2022-03-27 DIAGNOSIS — G4730 Sleep apnea, unspecified: Secondary | ICD-10-CM | POA: Diagnosis present

## 2022-03-27 DIAGNOSIS — Z79899 Other long term (current) drug therapy: Secondary | ICD-10-CM

## 2022-03-27 DIAGNOSIS — Z6837 Body mass index (BMI) 37.0-37.9, adult: Secondary | ICD-10-CM

## 2022-03-27 DIAGNOSIS — D649 Anemia, unspecified: Secondary | ICD-10-CM

## 2022-03-27 DIAGNOSIS — T783XXA Angioneurotic edema, initial encounter: Secondary | ICD-10-CM | POA: Diagnosis not present

## 2022-03-27 DIAGNOSIS — Z91012 Allergy to eggs: Secondary | ICD-10-CM

## 2022-03-27 DIAGNOSIS — Z888 Allergy status to other drugs, medicaments and biological substances status: Secondary | ICD-10-CM

## 2022-03-27 DIAGNOSIS — E876 Hypokalemia: Principal | ICD-10-CM | POA: Diagnosis present

## 2022-03-27 DIAGNOSIS — D638 Anemia in other chronic diseases classified elsewhere: Secondary | ICD-10-CM | POA: Diagnosis present

## 2022-03-27 DIAGNOSIS — T450X5A Adverse effect of antiallergic and antiemetic drugs, initial encounter: Secondary | ICD-10-CM | POA: Diagnosis not present

## 2022-03-27 DIAGNOSIS — T443X5A Adverse effect of other parasympatholytics [anticholinergics and antimuscarinics] and spasmolytics, initial encounter: Secondary | ICD-10-CM | POA: Diagnosis not present

## 2022-03-27 DIAGNOSIS — F319 Bipolar disorder, unspecified: Secondary | ICD-10-CM | POA: Diagnosis present

## 2022-03-27 DIAGNOSIS — R634 Abnormal weight loss: Secondary | ICD-10-CM

## 2022-03-27 DIAGNOSIS — K219 Gastro-esophageal reflux disease without esophagitis: Secondary | ICD-10-CM | POA: Diagnosis present

## 2022-03-27 DIAGNOSIS — J449 Chronic obstructive pulmonary disease, unspecified: Secondary | ICD-10-CM | POA: Diagnosis present

## 2022-03-27 DIAGNOSIS — R9431 Abnormal electrocardiogram [ECG] [EKG]: Secondary | ICD-10-CM

## 2022-03-27 DIAGNOSIS — R112 Nausea with vomiting, unspecified: Secondary | ICD-10-CM

## 2022-03-27 DIAGNOSIS — Z91011 Allergy to milk products: Secondary | ICD-10-CM

## 2022-03-27 DIAGNOSIS — R627 Adult failure to thrive: Secondary | ICD-10-CM | POA: Diagnosis present

## 2022-03-27 DIAGNOSIS — Z8673 Personal history of transient ischemic attack (TIA), and cerebral infarction without residual deficits: Secondary | ICD-10-CM

## 2022-03-27 DIAGNOSIS — D62 Acute posthemorrhagic anemia: Secondary | ICD-10-CM

## 2022-03-27 DIAGNOSIS — Z5900 Homelessness unspecified: Secondary | ICD-10-CM

## 2022-03-27 LAB — CBC
HCT: 23.9 % — ABNORMAL LOW (ref 36.0–46.0)
Hemoglobin: 8 g/dL — ABNORMAL LOW (ref 12.0–15.0)
MCH: 27.1 pg (ref 26.0–34.0)
MCHC: 33.5 g/dL (ref 30.0–36.0)
MCV: 81 fL (ref 80.0–100.0)
Platelets: 211 10*3/uL (ref 150–400)
RBC: 2.95 MIL/uL — ABNORMAL LOW (ref 3.87–5.11)
RDW: 16.5 % — ABNORMAL HIGH (ref 11.5–15.5)
WBC: 5.1 10*3/uL (ref 4.0–10.5)
nRBC: 1.2 % — ABNORMAL HIGH (ref 0.0–0.2)

## 2022-03-27 LAB — COMPREHENSIVE METABOLIC PANEL
ALT: 18 U/L (ref 0–44)
AST: 17 U/L (ref 15–41)
Albumin: 2.8 g/dL — ABNORMAL LOW (ref 3.5–5.0)
Alkaline Phosphatase: 46 U/L (ref 38–126)
Anion gap: 13 (ref 5–15)
BUN: 9 mg/dL (ref 6–20)
CO2: 31 mmol/L (ref 22–32)
Calcium: 8.3 mg/dL — ABNORMAL LOW (ref 8.9–10.3)
Chloride: 91 mmol/L — ABNORMAL LOW (ref 98–111)
Creatinine, Ser: 0.54 mg/dL (ref 0.44–1.00)
GFR, Estimated: >60 mL/min (ref >60)
Glucose, Bld: 85 mg/dL (ref 70–99)
Potassium: 2.3 mmol/L — CL (ref 3.5–5.1)
Sodium: 135 mmol/L (ref 135–145)
Total Bilirubin: 1.6 mg/dL — ABNORMAL HIGH (ref 0.3–1.2)
Total Protein: 6 g/dL — ABNORMAL LOW (ref 6.5–8.1)

## 2022-03-27 LAB — TROPONIN I (HIGH SENSITIVITY): Troponin I (High Sensitivity): 6 ng/L (ref <18)

## 2022-03-27 LAB — LIPASE, BLOOD: Lipase: 24 U/L (ref 11–51)

## 2022-03-27 MED ORDER — IOHEXOL 350 MG/ML SOLN
100.0000 mL | Freq: Once | INTRAVENOUS | Status: AC | PRN
  Administered 2022-03-27: 100 mL via INTRAVENOUS

## 2022-03-27 MED ORDER — POTASSIUM CHLORIDE 10 MEQ/100ML IV SOLN
10.0000 meq | Freq: Once | INTRAVENOUS | Status: AC
Start: 2022-03-27 — End: 2022-03-28
  Administered 2022-03-27: 10 meq via INTRAVENOUS
  Filled 2022-03-27: qty 100

## 2022-03-27 NOTE — ED Notes (Signed)
Pt NAD in bed, a/ox3. Pt states she has been vomiting x 2 days. Pt denies ABD pain or diarrhea, CP, SOB.  ?

## 2022-03-27 NOTE — ED Notes (Signed)
Patient drowsy but opens eyes to verbal stimuli. Patient initially would not state name but would follow commands to grasp with hands and move feet. Family informed that in and out foley catheter would need to be performed to obtain urine. Patient then began talking and stated she needed to urinate. Patient now states name, DOB and states she is in the hospital and states the correct current year. Assisted onto bedpan.  ?

## 2022-03-27 NOTE — ED Notes (Signed)
Patient transported to CT 

## 2022-03-27 NOTE — ED Triage Notes (Signed)
Pt arrived via [facility] from [facility] where she has been living with family since discharge from [facility] 1 week ago. Unknown why discharge. Pt is not ambulatory at baseline, uses wheelchair. Pt c/o N/V/D x4 days. Requested [facility] transport due to her family stating that she isn't getting the correct treatment and her doctors are at [facility]. Per EMS, pt has not had emesis or c/o of nausea/vomiting nor diarrhea in route.  ?

## 2022-03-27 NOTE — ED Provider Notes (Signed)
? ?[facility] ?Provider Note ? ? ? Event Date/Time  ? First MD Initiated Contact with Patient 03/27/22 2015   ?  (approximate) ? ? ?History  ? ?Nausea and Emesis ? ? ?HPI ? ?Tiffany [name] is a 61 y.o. female with a history of repeated admissions for nausea and vomiting.  Etiology of this is uncertain.  It is thought to be possibly due to gastroparesis.  She has had an upper GI endoscopy which did not reveal an etiology however.  Symptoms may have gotten worse after a stroke fairly recently.  Have so far been unable to find out when the stroke was.  Patient now is not speaking which apparently happens fairly often.  She is communicating with nods and shaking of her head.  She indicates that she does feel better but does not want to go home unless we check her out.  We will go ahead and begin evaluating her. ?Family arrives patient's older brother says patient is vomiting a lot and losing weight.  She is not speaking as much is usually.  She seems to be having some trouble hearing although when I speak to her here in the emergency room she is not having trouble hearing. ?  ? ? ?Physical Exam  ? ?Triage Vital Signs: ?ED Triage Vitals [03/27/22 2021]  ?Enc Vitals Group  ?   BP 104/65  ?   Pulse Rate 98  ?   Resp 17  ?   Temp 98.1 ?F (36.7 ?C)  ?   Temp Source Oral  ?   SpO2 98 %  ?   Weight   ?   Height   ?   Head Circumference   ?   Peak Flow   ?   Pain Score   ?   Pain Loc   ?   Pain Edu?   ?   Excl. in GC?   ? ? ?Most recent vital signs: ?Vitals:  ? 03/27/22 2021 03/27/22 2300  ?BP: 104/65 131/65  ?Pulse: 98 96  ?Resp: 17 14  ?Temp: 98.1 ?F (36.7 ?C)   ?SpO2: 98% 100%  ? ? ? ?General: Awake, alert, no distress. ?Head: Normocephalic atraumatic ?Eyes: Disconjugate gaze which I understand is old ?Mouth: No erythema or exudate ?CV:  Good peripheral perfusion.  Heart regular rate and rhythm no audible murmurs ?Resp:  Normal effort.  Lungs are clear ?Abd:  No distention.  Soft nontender no  organomegaly ?Extremities: With no edema patient moving all extremities equally. ? ? ?ED Results / Procedures / Treatments  ? ?Labs ?(all labs ordered are listed, but only abnormal results are displayed) ?Labs Reviewed  ?COMPREHENSIVE METABOLIC PANEL - Abnormal; Notable for the following components:  ?    Result Value  ? Potassium 2.3 (*)   ? Chloride 91 (*)   ? Calcium 8.3 (*)   ? Total Protein 6.0 (*)   ? Albumin 2.8 (*)   ? Total Bilirubin 1.6 (*)   ? All other components within normal limits  ?CBC - Abnormal; Notable for the following components:  ? RBC 2.95 (*)   ? Hemoglobin 8.0 (*)   ? HCT 23.9 (*)   ? RDW 16.5 (*)   ? nRBC 1.2 (*)   ? All other components within normal limits  ?LIPASE, BLOOD  ?URINALYSIS, ROUTINE W REFLEX MICROSCOPIC  ?URINE DRUG SCREEN, QUALITATIVE ([facility] ONLY)  ?TROPONIN I (HIGH SENSITIVITY)  ? ? ? ?EKG ? ?EKG read and interpreted by me  shows normal sinus rhythm rate of 84 normal axis with diffuse downsloping ST segments and T waves.  These are new ? ? ?RADIOLOGY ?CT of the abdomen shows some gallbladder sludge but otherwise no acute disease CT of the head shows no acute disease in the chest x-ray also shows no acute disease I reviewed all of these films ? ? ?PROCEDURES: ? ?Critical Care performed: Critical care time 45 minutes.  This includes speaking to the patient's older brother and the patient's fianc? and reviewing records from both this hospital and [facility].  Additionally I spoke with the patient herself twice and reviewed a number of her other studies.  I have also spoken with the hospitalist. ? ?Procedures ? ? ?MEDICATIONS ORDERED IN ED: ?Medications  ?potassium chloride 10 mEq in 100 mL IVPB (10 mEq Intravenous New Bag/Given 03/27/22 2318)  ?iohexol (OMNIPAQUE) 350 MG/ML injection 100 mL (100 mLs Intravenous Contrast Given 03/27/22 2230)  ? ? ? ?IMPRESSION / MDM / ASSESSMENT AND PLAN / ED COURSE  ?I reviewed the triage vital signs and the nursing notes. ? ?Patient had an upper GI  endoscopy in ?August 2022 results are below ?The upper GI endoscopy was  ?accomplished without difficulty. The patient tolerated  ?the procedure well. ? ?Findings: ?A diverticulum opening was found in the distal esophagus at 33cm from  ?the incisors. This was just proximal to the GE juction and the prior  ?fundoplication. ?LA Grade D (one or more mucosal breaks involving at least 75% of  ?esophageal circumference) esophagitis was found in the lower third of  ?the esophagus. This was likely due to stasis changes. ?A small paraesophageal hernia was found just proximal to the  ?fundoplication. ?Evidence of a fundoplication was found in the cardia. The wrap appeared  ?intact. This was traversed. ?The entire examined stomach was normal. ?The examined duodenum was normal. ? ?Impression: - Diverticulum in the distal esophagus. ?- LA Grade D esophagitis. Likely due to stasis. ?- Small paraesophageal hernia. ?- A fundoplication was found. The wrap appears intact. ?- Normal stomach. ?- Normal examined duodenum. ?- No specimens collected. ?Recommendation: - Patient has a contact number available for  ?emergencies. The signs and symptoms of potential  ?delayed complications were discussed with the patient.  ?Return to normal activities tomorrow. Written  ?discharge instructions were provided to the patient. ?- Continue present medications. ?- Resume previous diet. ?- Increase PPI to BID ?- Will arrange GI clinic follow up to discuss  ?endoscopic treatment of diverticulum vs surgical  ?treatment. ? ?----------------------------------------- ?9:07 PM on 03/27/2022 ?----------------------------------------- ?I called the number of phone numbers in the old record and got a hold of the patient's fianc?Marland Kitchen  He reports she has been vomiting a lot has not been speaking much at all which is somewhat unusual for her.  She had a stroke in November.  But she was speaking after that.  She also is sometimes not seeming to hear very well.  She  seemed to be hearing me well enough.  However because of all this I will get a CT of her head and abdomen.  We will looking for things that could cause a new etiology for her vomiting make sure she does not have any brain masses or acute strokes. ?Differential diagnosis includes, but is not limited to, possible stroke or recurrence of her esophageal diverticulum and esophagitis or gastritis both of which she was presumed to have in the past.  Additionally she has had a fundoplication possibly there is something going on  with that or some other intra-abdominal process.  She also has bipolar illness perhaps she is depressed. ?The patient is on the cardiac monitor to evaluate for evidence of arrhythmia and/or significant heart rate changes.  None were seen ? ?Patient CT shows some gallbladder sludge.  Patient's brother reports patient is losing weight and vomiting a lot.  Patient's sodium is okay but her potassium is low.  She is apparently unable to tolerate anything by mouth.  We will get her in the hospital replete her potassium IV and see if we can get a hold [facility] to arrange closer follow-up which is been unable to be done so far for a number of different reasons in spite of repeated efforts. ? ?FINAL CLINICAL IMPRESSION(S) / ED DIAGNOSES  ? ?Final diagnoses:  ?Nausea and vomiting, unspecified vomiting type  ?Weight loss  ?Hypokalemia  ?Abnormal EKG  ? ? ? ?Rx / DC Orders  ? ?ED Discharge Orders   ? ? None  ? ?  ? ? ? ?Note:  This document was prepared using Dragon voice recognition software and may include unintentional dictation errors. ?  ?Arnaldo NatalMalinda, Amairany Schumpert F, MD ?03/27/22 2341 ? ?

## 2022-03-28 DIAGNOSIS — Z91012 Allergy to eggs: Secondary | ICD-10-CM | POA: Diagnosis not present

## 2022-03-28 DIAGNOSIS — Z91011 Allergy to milk products: Secondary | ICD-10-CM | POA: Diagnosis not present

## 2022-03-28 DIAGNOSIS — Z8673 Personal history of transient ischemic attack (TIA), and cerebral infarction without residual deficits: Secondary | ICD-10-CM | POA: Diagnosis not present

## 2022-03-28 DIAGNOSIS — Z79899 Other long term (current) drug therapy: Secondary | ICD-10-CM | POA: Diagnosis not present

## 2022-03-28 DIAGNOSIS — R627 Adult failure to thrive: Secondary | ICD-10-CM | POA: Diagnosis present

## 2022-03-28 DIAGNOSIS — J449 Chronic obstructive pulmonary disease, unspecified: Secondary | ICD-10-CM | POA: Diagnosis present

## 2022-03-28 DIAGNOSIS — G4730 Sleep apnea, unspecified: Secondary | ICD-10-CM | POA: Diagnosis present

## 2022-03-28 DIAGNOSIS — Z5900 Homelessness unspecified: Secondary | ICD-10-CM | POA: Diagnosis not present

## 2022-03-28 DIAGNOSIS — T450X5A Adverse effect of antiallergic and antiemetic drugs, initial encounter: Secondary | ICD-10-CM | POA: Diagnosis not present

## 2022-03-28 DIAGNOSIS — Z888 Allergy status to other drugs, medicaments and biological substances status: Secondary | ICD-10-CM | POA: Diagnosis not present

## 2022-03-28 DIAGNOSIS — T783XXA Angioneurotic edema, initial encounter: Secondary | ICD-10-CM | POA: Diagnosis not present

## 2022-03-28 DIAGNOSIS — D638 Anemia in other chronic diseases classified elsewhere: Secondary | ICD-10-CM | POA: Diagnosis present

## 2022-03-28 DIAGNOSIS — R112 Nausea with vomiting, unspecified: Secondary | ICD-10-CM | POA: Diagnosis not present

## 2022-03-28 DIAGNOSIS — E876 Hypokalemia: Secondary | ICD-10-CM

## 2022-03-28 DIAGNOSIS — F319 Bipolar disorder, unspecified: Secondary | ICD-10-CM | POA: Diagnosis present

## 2022-03-28 DIAGNOSIS — Z6837 Body mass index (BMI) 37.0-37.9, adult: Secondary | ICD-10-CM | POA: Diagnosis not present

## 2022-03-28 DIAGNOSIS — T443X5A Adverse effect of other parasympatholytics [anticholinergics and antimuscarinics] and spasmolytics, initial encounter: Secondary | ICD-10-CM | POA: Diagnosis not present

## 2022-03-28 DIAGNOSIS — K219 Gastro-esophageal reflux disease without esophagitis: Secondary | ICD-10-CM | POA: Diagnosis present

## 2022-03-28 DIAGNOSIS — D649 Anemia, unspecified: Secondary | ICD-10-CM

## 2022-03-28 DIAGNOSIS — T783XXD Angioneurotic edema, subsequent encounter: Secondary | ICD-10-CM | POA: Diagnosis not present

## 2022-03-28 LAB — URINE DRUG SCREEN, QUALITATIVE (ARMC ONLY)
Amphetamines, Ur Screen: NOT DETECTED
Barbiturates, Ur Screen: NOT DETECTED
Benzodiazepine, Ur Scrn: NOT DETECTED
Cannabinoid 50 Ng, Ur ~~LOC~~: NOT DETECTED
Cocaine Metabolite,Ur ~~LOC~~: NOT DETECTED
MDMA (Ecstasy)Ur Screen: NOT DETECTED
Methadone Scn, Ur: NOT DETECTED
Opiate, Ur Screen: NOT DETECTED
Phencyclidine (PCP) Ur S: NOT DETECTED
Tricyclic, Ur Screen: NOT DETECTED

## 2022-03-28 LAB — URINALYSIS, ROUTINE W REFLEX MICROSCOPIC
Bilirubin Urine: NEGATIVE
Glucose, UA: NEGATIVE mg/dL
Hgb urine dipstick: NEGATIVE
Ketones, ur: 20 mg/dL — AB
Leukocytes,Ua: NEGATIVE
Nitrite: NEGATIVE
Protein, ur: NEGATIVE mg/dL
Specific Gravity, Urine: >1.046 — ABNORMAL HIGH (ref 1.005–1.030)
pH: 6 (ref 5.0–8.0)

## 2022-03-28 LAB — CBC
HCT: 26.6 % — ABNORMAL LOW (ref 36.0–46.0)
Hemoglobin: 8.6 g/dL — ABNORMAL LOW (ref 12.0–15.0)
MCH: 26.5 pg (ref 26.0–34.0)
MCHC: 32.3 g/dL (ref 30.0–36.0)
MCV: 82.1 fL (ref 80.0–100.0)
Platelets: 221 10*3/uL (ref 150–400)
RBC: 3.24 MIL/uL — ABNORMAL LOW (ref 3.87–5.11)
RDW: 16.6 % — ABNORMAL HIGH (ref 11.5–15.5)
WBC: 4.8 10*3/uL (ref 4.0–10.5)
nRBC: 1.3 % — ABNORMAL HIGH (ref 0.0–0.2)

## 2022-03-28 LAB — BASIC METABOLIC PANEL
Anion gap: 11 (ref 5–15)
BUN: 9 mg/dL (ref 6–20)
CO2: 32 mmol/L (ref 22–32)
Calcium: 8.1 mg/dL — ABNORMAL LOW (ref 8.9–10.3)
Chloride: 91 mmol/L — ABNORMAL LOW (ref 98–111)
Creatinine, Ser: 0.46 mg/dL (ref 0.44–1.00)
GFR, Estimated: >60 mL/min (ref >60)
Glucose, Bld: 78 mg/dL (ref 70–99)
Potassium: 2.7 mmol/L — CL (ref 3.5–5.1)
Sodium: 134 mmol/L — ABNORMAL LOW (ref 135–145)

## 2022-03-28 LAB — MAGNESIUM: Magnesium: 1.7 mg/dL (ref 1.7–2.4)

## 2022-03-28 LAB — TROPONIN I (HIGH SENSITIVITY): Troponin I (High Sensitivity): 9 ng/L (ref <18)

## 2022-03-28 MED ORDER — TIOTROPIUM BROMIDE MONOHYDRATE 18 MCG IN CAPS
1.0000 | ORAL_CAPSULE | Freq: Every day | RESPIRATORY_TRACT | Status: DC
  Administered 2022-03-28 – 2022-03-30 (×3): 18 ug via RESPIRATORY_TRACT
  Filled 2022-03-28: qty 5

## 2022-03-28 MED ORDER — FLUTICASONE PROPIONATE HFA 110 MCG/ACT IN AERO: 2.0000 | INHALATION_SPRAY | Freq: Two times a day (BID) | RESPIRATORY_TRACT | Status: DC

## 2022-03-28 MED ORDER — ACETAMINOPHEN 325 MG PO TABS
650.0000 mg | ORAL_TABLET | Freq: Four times a day (QID) | ORAL | Status: DC | PRN
  Administered 2022-03-31: 650 mg via ORAL
  Filled 2022-03-28: qty 2

## 2022-03-28 MED ORDER — METOCLOPRAMIDE HCL 5 MG/ML IJ SOLN
10.0000 mg | Freq: Four times a day (QID) | INTRAMUSCULAR | Status: AC
  Administered 2022-03-28 (×3): 10 mg via INTRAVENOUS
  Filled 2022-03-28 (×3): qty 2

## 2022-03-28 MED ORDER — IPRATROPIUM-ALBUTEROL 0.5-2.5 (3) MG/3ML IN SOLN: 3.0000 mL | RESPIRATORY_TRACT | Status: DC | PRN

## 2022-03-28 MED ORDER — PANTOPRAZOLE SODIUM 40 MG IV SOLR
40.0000 mg | INTRAVENOUS | Status: DC
  Administered 2022-03-28 – 2022-03-30 (×3): 40 mg via INTRAVENOUS
  Filled 2022-03-28 (×3): qty 10

## 2022-03-28 MED ORDER — SODIUM CHLORIDE 0.9 % IV SOLN: INTRAVENOUS | Status: DC

## 2022-03-28 MED ORDER — ENOXAPARIN SODIUM 60 MG/0.6ML IJ SOSY
0.5000 mg/kg | PREFILLED_SYRINGE | INTRAMUSCULAR | Status: DC
  Administered 2022-03-28 – 2022-03-31 (×4): 57.5 mg via SUBCUTANEOUS
  Filled 2022-03-28 (×4): qty 0.6

## 2022-03-28 MED ORDER — POTASSIUM CHLORIDE 10 MEQ/100ML IV SOLN
10.0000 meq | INTRAVENOUS | Status: AC
  Administered 2022-03-28 (×6): 10 meq via INTRAVENOUS
  Filled 2022-03-28 (×4): qty 100

## 2022-03-28 MED ORDER — ONDANSETRON HCL 4 MG PO TABS: 4.0000 mg | ORAL_TABLET | Freq: Four times a day (QID) | ORAL | Status: DC | PRN

## 2022-03-28 MED ORDER — BUDESONIDE 0.25 MG/2ML IN SUSP
0.2500 mg | Freq: Two times a day (BID) | RESPIRATORY_TRACT | Status: DC
  Administered 2022-03-28 – 2022-03-31 (×7): 0.25 mg via RESPIRATORY_TRACT
  Filled 2022-03-28 (×7): qty 2

## 2022-03-28 MED ORDER — POTASSIUM CHLORIDE 10 MEQ/100ML IV SOLN
10.0000 meq | INTRAVENOUS | Status: AC
  Administered 2022-03-28 (×3): 10 meq via INTRAVENOUS
  Filled 2022-03-28 (×3): qty 100

## 2022-03-28 MED ORDER — PROCHLORPERAZINE EDISYLATE 10 MG/2ML IJ SOLN
10.0000 mg | Freq: Four times a day (QID) | INTRAMUSCULAR | Status: DC | PRN
  Filled 2022-03-28: qty 2

## 2022-03-28 MED ORDER — ONDANSETRON HCL 4 MG/2ML IJ SOLN
4.0000 mg | Freq: Four times a day (QID) | INTRAMUSCULAR | Status: DC | PRN
  Administered 2022-03-31: 4 mg via INTRAVENOUS
  Filled 2022-03-28: qty 2

## 2022-03-28 MED ORDER — ACETAMINOPHEN 650 MG RE SUPP: 650.0000 mg | Freq: Four times a day (QID) | RECTAL | Status: DC | PRN

## 2022-03-28 NOTE — Assessment & Plan Note (Addendum)
Potassium 2.8 today.  Replace IV and oral potassium today and recheck tomorrow. ?

## 2022-03-28 NOTE — H&P (Signed)
?History and Physical  ? ? ?Patient: Tiffany [name] CVE:[id number] DOB: July 31, 1961 ?DOA: 03/27/2022 ?DOS: the patient was seen and examined on 03/28/2022 ?PCP: Eddie Dibbles, MD  ?Patient coming from: Home ? ?Chief Complaint:  ?Chief Complaint  ?Patient presents with  ? Nausea  ? Emesis  ? ? ?HPI: Tiffany [name] is a 61 y.o. female with medical history significant for Bipolar 1 disorder, COPD, hiatal hernia, history of Nissen fundoplication, frequent hospitalizations for refractory vomiting resulting in hypokalemia, most recently from 3/8 to 03/16/2022, and with recent upper endoscopy on 02/13/2022 that showed gastritis and a nonbleeding esophageal diverticulum who returns to the ED with intractable nausea and vomiting.  She denies abdominal pain, fever or chills or diarrhea. ?ED course: Soft blood pressure on arrival of 104/65 that was fluid responsive and with otherwise normal vitals.  Blood work significant for potassium of 2.3, hemoglobin 8 which is baseline and total protein of 6.  Total bilirubin 1.6.  EKG, personally viewed and interpreted: Sinus at 94 with no acute ST-T wave changes CT abdomen and pelvis showing gallbladder sludge with no evidence of cholelithiasis or acute cholecystitis.  Chest x-ray nonacute as well as head CT. ?Patient was treated with IV potassium and hospitalist consulted for admission.  ? ?Review of Systems: Unable to obtain as patient not answering questions asked.  Awake and aware of environment ?Past Medical History:  ?Diagnosis Date  ? Asthma   ? Bipolar 1 disorder (HCC)   ? COPD (chronic obstructive pulmonary disease) (HCC)   ? CVA (cerebral vascular accident) [facility])   ? Enlarged heart   ? GERD (gastroesophageal reflux disease)   ? Migraines   ? Sleep apnea   ? ?Past Surgical History:  ?Procedure Laterality Date  ? ABDOMINAL HYSTERECTOMY    ? ABDOMINAL SURGERY    ? "For acid reflux"  ? CESAREAN SECTION    ? ESOPHAGOGASTRODUODENOSCOPY N/A 02/13/2022  ? Procedure:  ESOPHAGOGASTRODUODENOSCOPY (EGD);  Surgeon: Toledo, Boykin Nearing, MD;  Location: [facility] ENDOSCOPY;  Service: Gastroenterology;  Laterality: N/A;  ? OTHER SURGICAL HISTORY  01/09/1998  ? acid reflux, hole in stomach they closed  ? ?Social History:  reports that she has never smoked. She has never used smokeless tobacco. She reports that she does not drink alcohol and does not use drugs. ? ?Allergies  ?Allergen Reactions  ? Dairy Aid [Tilactase] Other (See Comments)  ?  Exacerbates asthma  ? Egg [Eggs Or Egg-Derived Products] Other (See Comments)  ?  Exacerbated asthma  ? Milk-Related Compounds   ? ? ?Family History  ?Problem Relation Age of Onset  ? Hypertension Mother   ? ? ?Prior to Admission medications   ?Medication Sig Start Date End Date Taking? Authorizing Provider  ?diclofenac Sodium (VOLTAREN) 1 % GEL Apply 4 g topically 4 (four) times daily. 11/08/21  Yes Lynn Ito, MD  ?diphenhydrAMINE (BENADRYL) 12.5 MG/5ML elixir Take 10 mLs (25 mg total) by mouth 4 (four) times daily as needed. 02/18/22  Yes Georga Hacking, MD  ?[facility] 110 MCG/ACT inhaler Inhale 2 puffs into the lungs 2 (two) times daily. 10/20/21  Yes [provider]  ?Multiple Vitamin (MULTIVITAMIN WITH MINERALS) TABS tablet Take 1 tablet by mouth daily. 11/09/21  Yes Lynn Ito, MD  ?Nutritional Supplements (FEEDING SUPPLEMENT, KATE FARMS STANDARD 1.4,) LIQD liquid Take 325 mLs by mouth 3 (three) times daily between meals. 11/08/21  Yes Lynn Ito, MD  ?ondansetron (ZOFRAN-ODT) 4 MG disintegrating tablet Take 1 tablet (4 mg total) by  mouth every 8 (eight) hours as needed for nausea or vomiting. 02/17/22  Yes Darlin Priestly, MD  ?polyethylene glycol (MIRALAX / GLYCOLAX) 17 g packet Take 17 g by mouth 2 (two) times daily as needed. 02/17/22  Yes Darlin Priestly, MD  ?[facility] HANDIHALER 18 MCG inhalation capsule 1 capsule at bedtime. 08/02/21  Yes [provider]  ? ? ?Physical Exam: ?Vitals:  ? 03/27/22 2021 03/27/22 2300 03/27/22 2315  03/28/22 0156  ?BP: 104/65 131/65 118/76 135/84  ?Pulse: 98 96 79 85  ?Resp: 17 14 12 16   ?Temp: 98.1 ?F (36.7 ?C)     ?TempSrc: Oral     ?SpO2: 98% 100% 100% 100%  ? ?Physical Exam ?Constitutional:   ?   Appearance: She is ill-appearing.  ?Cardiovascular:  ?   Rate and Rhythm: Normal rate and regular rhythm.  ?Pulmonary:  ?   Effort: Pulmonary effort is normal.  ?   Breath sounds: Normal breath sounds.  ?Abdominal:  ?   Palpations: Abdomen is soft.  ?   Tenderness: There is no abdominal tenderness.  ?Neurological:  ?   General: No focal deficit present.  ? ? ? ?Data Reviewed: ?Relevant notes from primary care and specialist visits, past discharge summaries as available in EHR, including Care Everywhere. ?Prior diagnostic testing as pertinent to current admission diagnoses ?Updated medications and problem lists for reconciliation ?ED course, including vitals, labs, imaging, treatment and response to treatment ?Triage notes, nursing and pharmacy notes and ED provider's notes ?Notable results as noted in HPI ? ? ?Assessment and Plan: ?* Hypokalemia due to excessive gastrointestinal loss of potassium ?IV potassium supplementation and monitor potassium ?Telemetry ? ?Refractory nausea and vomiting ?Etiology undetermined, possible gastroparesis.  EGD 02/13/2022 with gastritis ?Reglan standing dose with as needed Zofran and Compazine for breakthrough ?IV hydration, IV Protonix ?Ice chips to advance as tolerated ? ?Chronic anemia ?Hemoglobin at baseline ? ?History of Nissen fundoplication ?Appeared intact on EGD in February 2023 ? ?COPD (chronic obstructive pulmonary disease) (HCC) ?Not acutely exacerbated ?DuoNebs as needed, Flovent twice daily ? ?Obesity, Class III, BMI 40-49.9 (morbid obesity) (HCC) ?Complicating factor to overall prognosis and care ? ?Bipolar 1 disorder (HCC) ?Continue home meds pending verification ? ? ? ? ? ? ?Advance Care Planning:   Code Status: Prior  ? ?Consults: none ? ?Family Communication:  none ? ?Severity of Illness: ?The appropriate patient status for this patient is INPATIENT. Inpatient status is judged to be reasonable and necessary in order to provide the required intensity of service to ensure the patient's safety. The patient's presenting symptoms, physical exam findings, and initial radiographic and laboratory data in the context of their chronic comorbidities is felt to place them at high risk for further clinical deterioration. Furthermore, it is not anticipated that the patient will be medically stable for discharge from the hospital within 2 midnights of admission.  ? ?* I certify that at the point of admission it is my clinical judgment that the patient will require inpatient hospital care spanning beyond 2 midnights from the point of admission due to high intensity of service, high risk for further deterioration and high frequency of surveillance required.* ? ?Author: ?March 2023, MD ?03/28/2022 3:34 AM ? ?For on call review [email or web address].  ?

## 2022-03-28 NOTE — Assessment & Plan Note (Addendum)
Not acutely exacerbated.  Got rid of nebulizers. ? ?

## 2022-03-28 NOTE — Assessment & Plan Note (Deleted)
See above. 

## 2022-03-28 NOTE — ED Notes (Signed)
Informed RN bed assigned 

## 2022-03-28 NOTE — Assessment & Plan Note (Signed)
Continue home meds pending verification 

## 2022-03-28 NOTE — Progress Notes (Signed)
?PROGRESS NOTE ? ? ?HPI was taken from Dr. Para Marchuncan: ? Tiffany [name] is a 61 y.o. female with medical history significant for Bipolar 1 disorder, COPD, hiatal hernia, history of Nissen fundoplication, frequent hospitalizations for refractory vomiting resulting in hypokalemia, most recently from 3/8 to 03/16/2022, and with recent upper endoscopy on 02/13/2022 that showed gastritis and a nonbleeding esophageal diverticulum who returns to the ED with intractable nausea and vomiting.  She denies abdominal pain, fever or chills or diarrhea. ?ED course: Soft blood pressure on arrival of 104/65 that was fluid responsive and with otherwise normal vitals.  Blood work significant for potassium of 2.3, hemoglobin 8 which is baseline and total protein of 6.  Total bilirubin 1.6.  EKG, personally viewed and interpreted: Sinus at 94 with no acute ST-T wave changes CT abdomen and pelvis showing gallbladder sludge with no evidence of cholelithiasis or acute cholecystitis.  Chest x-ray nonacute as well as head CT. ?Patient was treated with IV potassium and hospitalist consulted for admission.  ? ? ?Tiffany [name]  [id number] DOB: 06-01-1961 DOA: 03/27/2022 ?PCP: Eddie DibblesAllison, James H, MD ? ?Assessment & Plan: ?  ?Principal Problem: ?  Hypokalemia due to excessive gastrointestinal loss of potassium ?Active Problems: ?  Refractory nausea and vomiting ?  Hypokalemia ?  Bipolar 1 disorder (HCC) ?  Obesity, Class III, BMI 40-49.9 (morbid obesity) (HCC) ?  COPD (chronic obstructive pulmonary disease) (HCC) ?  History of Nissen fundoplication ?  Chronic anemia ? ? ?Hypokalemia: likely secondary to vomiting. Potassium given. Repeat level ordered. Continue on tele  ?  ?Refractory nausea and vomiting: etiology unclear, possible gastroparesis. EGD on 02/13/22 w/ gastritis. Continue on PPI, reglan & zofran. Urine drug screen was neg  ? ?Chronic normocytic anemia: H&H are labile. No need for a transfusion currently  ?  ?Hx of Nissen  fundoplication: appeared intact on EGD in February 2023. ?  ?COPD: w/o exacerbation. Continue on bronchodilators  ? ?Obesity: BMI 37.4. Complicates overall care & prognosis  ?  ?Bipolar 1 disorder: not on any meds for this as per med rec  ? ? ?DVT prophylaxis: lovenox ?Code Status: full  ?Family Communication: discussed pt's care w/ pt's brother, Hinton DyerClifford, and answered his questions  ?Disposition Plan: depends on PT/OT recs (not consulted yet) ? ?Level of care: Telemetry Medical ? ?Status is: Inpatient ?Remains inpatient appropriate because: significant hypokalemia ? ? ? ? ?Consultants:  ? ? ?Procedures:  ? ?Antimicrobials:  ? ? ?Subjective: ?Pt denies any pain  ? ?Objective: ?Vitals:  ? 03/28/22 0649 03/28/22 1030 03/28/22 1100 03/28/22 1330  ?BP:  123/73 125/77 137/88  ?Pulse:  85 80 (!) 104  ?Resp:  13 12 16   ?Temp:      ?TempSrc:      ?SpO2:  95% 97% 99%  ?Weight: 115 kg     ? ? ?Intake/Output Summary (Last 24 hours) at 03/28/2022 1442 ?Last data filed at 03/28/2022 [phone] ?Gross per 24 hour  ?Intake 281.43 ml  ?Output --  ?Net 281.43 ml  ? ?Filed Weights  ? 03/28/22 0649  ?Weight: 115 kg  ? ? ?Examination: ? ?General exam: Appears calm and comfortable  ?Respiratory system: Clear to auscultation. Respiratory effort normal. ?Cardiovascular system: S1 & S2 +. No  rubs, gallops or clicks. No pedal edema. ?Gastrointestinal system: Abdomen is nondistended, soft and nontender. Normal bowel sounds heard. ?Central nervous system: Alert and awake. ?Psychiatry: Judgement and insight appears poor. Flat mood and affect   ? ? ? ?Data Reviewed: I  have personally reviewed following labs and imaging studies ? ?CBC: ?Recent Labs  ?Lab 03/27/22 ?2108 03/28/22 ?0904  ?WBC 5.1 4.8  ?HGB 8.0* 8.6*  ?HCT 23.9* 26.6*  ?MCV 81.0 82.1  ?PLT 211 221  ? ?Basic Metabolic Panel: ?Recent Labs  ?Lab 03/27/22 ?2108 03/28/22 ?0904  ?NA 135 134*  ?K 2.3* 2.7*  ?CL 91* 91*  ?CO2 31 32  ?GLUCOSE 85 78  ?BUN 9 9  ?CREATININE 0.54 0.46  ?CALCIUM 8.3*  8.1*  ?MG  --  1.7  ? ?GFR: ?Estimated Creatinine Clearance: 102.4 mL/min (by C-G formula based on SCr of 0.46 mg/dL). ?Liver Function Tests: ?Recent Labs  ?Lab 03/27/22 ?2108  ?AST 17  ?ALT 18  ?ALKPHOS 46  ?BILITOT 1.6*  ?PROT 6.0*  ?ALBUMIN 2.8*  ? ?Recent Labs  ?Lab 03/27/22 ?2108  ?LIPASE 24  ? ?No results for input(s): AMMONIA in the last 168 hours. ?Coagulation Profile: ?No results for input(s): INR, PROTIME in the last 168 hours. ?Cardiac Enzymes: ?No results for input(s): CKTOTAL, CKMB, CKMBINDEX, TROPONINI in the last 168 hours. ?BNP (last 3 results) ?No results for input(s): PROBNP in the last 8760 hours. ?HbA1C: ?No results for input(s): HGBA1C in the last 72 hours. ?CBG: ?No results for input(s): GLUCAP in the last 168 hours. ?Lipid Profile: ?No results for input(s): CHOL, HDL, LDLCALC, TRIG, CHOLHDL, LDLDIRECT in the last 72 hours. ?Thyroid Function Tests: ?No results for input(s): TSH, T4TOTAL, FREET4, T3FREE, THYROIDAB in the last 72 hours. ?Anemia Panel: ?No results for input(s): VITAMINB12, FOLATE, FERRITIN, TIBC, IRON, RETICCTPCT in the last 72 hours. ?Sepsis Labs: ?No results for input(s): PROCALCITON, LATICACIDVEN in the last 168 hours. ? ?No results found for this or any previous visit (from the past 240 hour(s)).  ? ? ? ? ? ?Radiology Studies: ?CT Head Wo Contrast ? ?Result Date: 03/27/2022 ?CLINICAL DATA:  Altered mental status. EXAM: CT HEAD WITHOUT CONTRAST TECHNIQUE: Contiguous axial images were obtained from the base of the skull through the vertex without intravenous contrast. RADIATION DOSE REDUCTION: This exam was performed according to the departmental dose-optimization program which includes automated exposure control, adjustment of the mA and/or kV according to patient size and/or use of iterative reconstruction technique. COMPARISON:  Plain brain CT, dated November 13, 2021 and MR head dated March 08, 2022 FINDINGS: Brain: No evidence of acute infarction, hemorrhage, hydrocephalus,  extra-axial collection or mass lesion/mass effect. 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 Vascular: No hyperdense vessel or unexpected calcification. Skull: Normal. Negative for fracture or focal lesion. Sinuses/Orbits: No acute finding. Other: None. IMPRESSION: No acute intracranial abnormality. Electronically Signed   By: Aram Candela M.D.   On: 03/27/2022 22:51  ? ?CT ABDOMEN PELVIS W CONTRAST ? ?Result Date: 03/27/2022 ?CLINICAL DATA:  Nausea and vomiting EXAM: CT ABDOMEN AND PELVIS WITH CONTRAST TECHNIQUE: Multidetector CT imaging of the abdomen and pelvis was performed using the standard protocol following bolus administration of intravenous contrast. RADIATION DOSE REDUCTION: This exam was performed according to the departmental dose-optimization program which includes automated exposure control, adjustment of the mA and/or kV according to patient size and/or use of iterative reconstruction technique. CONTRAST:  OMNIPAQUE IOHEXOL 350 MG/ML SOLN COMPARISON:  02/07/2022 FINDINGS: Lower chest: No acute pleural or parenchymal lung disease. Stable hiatal hernia. Hepatobiliary: High attenuation material layering dependently in the gallbladder consistent with gallbladder sludge. No evidence of cholelithiasis or cholecystitis. The liver is grossly normal. Pancreas: Fatty atrophy of the pancreas.  No focal abnormalities. Spleen: Normal in size without  focal abnormality. Adrenals/Urinary Tract: Adrenal glands are unremarkable. Kidneys are normal, without renal calculi, focal lesion, or hydronephrosis. Bladder is unremarkable. Stomach/Bowel: No bowel obstruction or ileus. Stable borderline dilation of the appendix measuring 6 mm in diameter, with no evidence of mural thickening, abnormal enhancement, or surrounding fat stranding to suggest appendicitis. Vascular/Lymphatic: No significant vascular findings are present. No enlarged abdominal or pelvic lymph nodes. Reproductive: Status post hysterectomy. No  adnexal masses. Other: No free fluid or free gas.  No abdominal wall hernia. Musculoskeletal: No acute or destructive bony lesions. Reconstructed images demonstrate no additional findings. IMPRESSION: 1. Gallbladder sludge.

## 2022-03-28 NOTE — Assessment & Plan Note (Signed)
Appeared intact on EGD in February 2023 ?

## 2022-03-28 NOTE — Assessment & Plan Note (Deleted)
Complicating factor to overall prognosis and care 

## 2022-03-28 NOTE — Assessment & Plan Note (Addendum)
On dysphagia diet if able to tolerate.  Continue Protonix.  As needed nausea medications.  Reviewed prior endoscopy showing esophageal diverticulum which could be the cause of this ?

## 2022-03-29 DIAGNOSIS — E876 Hypokalemia: Secondary | ICD-10-CM | POA: Diagnosis not present

## 2022-03-29 DIAGNOSIS — R112 Nausea with vomiting, unspecified: Secondary | ICD-10-CM

## 2022-03-29 LAB — CBC
HCT: 21.8 % — ABNORMAL LOW (ref 36.0–46.0)
Hemoglobin: 7.5 g/dL — ABNORMAL LOW (ref 12.0–15.0)
MCH: 27.4 pg (ref 26.0–34.0)
MCHC: 34.4 g/dL (ref 30.0–36.0)
MCV: 79.6 fL — ABNORMAL LOW (ref 80.0–100.0)
Platelets: 209 10*3/uL (ref 150–400)
RBC: 2.74 MIL/uL — ABNORMAL LOW (ref 3.87–5.11)
RDW: 16.5 % — ABNORMAL HIGH (ref 11.5–15.5)
WBC: 5.3 10*3/uL (ref 4.0–10.5)
nRBC: 0.9 % — ABNORMAL HIGH (ref 0.0–0.2)

## 2022-03-29 LAB — BASIC METABOLIC PANEL
Anion gap: 11 (ref 5–15)
Anion gap: 13 (ref 5–15)
BUN: 8 mg/dL (ref 6–20)
BUN: 8 mg/dL (ref 6–20)
CO2: 28 mmol/L (ref 22–32)
CO2: 26 mmol/L (ref 22–32)
Calcium: 8.1 mg/dL — ABNORMAL LOW (ref 8.9–10.3)
Calcium: 8.2 mg/dL — ABNORMAL LOW (ref 8.9–10.3)
Chloride: 97 mmol/L — ABNORMAL LOW (ref 98–111)
Chloride: 99 mmol/L (ref 98–111)
Creatinine, Ser: 0.4 mg/dL — ABNORMAL LOW (ref 0.44–1.00)
Creatinine, Ser: 0.53 mg/dL (ref 0.44–1.00)
GFR, Estimated: >60 mL/min (ref >60)
GFR, Estimated: >60 mL/min (ref >60)
Glucose, Bld: 86 mg/dL (ref 70–99)
Glucose, Bld: 93 mg/dL (ref 70–99)
Potassium: 3.1 mmol/L — ABNORMAL LOW (ref 3.5–5.1)
Potassium: 3.5 mmol/L (ref 3.5–5.1)
Sodium: 136 mmol/L (ref 135–145)
Sodium: 138 mmol/L (ref 135–145)

## 2022-03-29 LAB — MAGNESIUM
Magnesium: 1.5 mg/dL — ABNORMAL LOW (ref 1.7–2.4)
Magnesium: 2.5 mg/dL — ABNORMAL HIGH (ref 1.7–2.4)

## 2022-03-29 MED ORDER — METOCLOPRAMIDE HCL 5 MG/ML IJ SOLN
10.0000 mg | Freq: Four times a day (QID) | INTRAMUSCULAR | Status: DC
  Administered 2022-03-29 – 2022-03-30 (×3): 10 mg via INTRAVENOUS
  Filled 2022-03-29 (×3): qty 2

## 2022-03-29 MED ORDER — POTASSIUM CHLORIDE CRYS ER 20 MEQ PO TBCR
40.0000 meq | EXTENDED_RELEASE_TABLET | ORAL | Status: AC
  Administered 2022-03-29 (×2): 40 meq via ORAL
  Filled 2022-03-29 (×2): qty 2

## 2022-03-29 MED ORDER — MAGNESIUM SULFATE 4 GM/100ML IV SOLN
4.0000 g | Freq: Once | INTRAVENOUS | Status: AC
  Administered 2022-03-29: 4 g via INTRAVENOUS
  Filled 2022-03-29: qty 100

## 2022-03-29 NOTE — Progress Notes (Signed)
?PROGRESS NOTE ? ? ?Brief Hospital course: ? Tiffany [name] is a 61 y.o. female with medical history significant for Bipolar 1 disorder, COPD, hiatal hernia, history of Nissen fundoplication, frequent hospitalizations for refractory vomiting resulting in hypokalemia, most recently from 3/8 to 03/16/2022, and with recent upper endoscopy on 02/13/2022 that showed gastritis and a nonbleeding esophageal diverticulum who returns to the ED with intractable nausea and vomiting. CT abdomen and pelvis showing gallbladder sludge with no evidence of cholelithiasis or acute cholecystitis.  Chest x-ray nonacute as well as head CT.  Admitted for refractory nausea, vomiting, severe hypokalemia and failure to thrive. ? Tiffany [name]  [id number] DOB: [birth year] DOA: 03/27/2022 ?PCP: Nelle Don, MD ? ?Assessment & Plan: ?  ?Principal Problem: ?  Hypokalemia due to excessive gastrointestinal loss of potassium ?Active Problems: ?  Refractory nausea and vomiting ?  Hypokalemia ?  Bipolar 1 disorder ([place]) ?  Obesity, Class III, BMI 40-49.9 (morbid obesity) ([place]) ?  COPD (chronic obstructive pulmonary disease) ([place]) ?  History of Nissen fundoplication ?  Chronic anemia ? ? ?Hypokalemia: likely secondary to GI losses and poor oral intake.  Presented with potassium of 2.3.  Up to 3.1 today.  Replace aggressively p.o., tolerating per RN and follow BMP later this evening and in a.m.  Replace magnesium. ? ?Hypomagnesemia: Magnesium 1.5.  Replace aggressively and follow ?  ?Refractory nausea and vomiting: etiology unclear, possible gastroparesis. EGD on 02/13/22 w/ gastritis. Continue on PPI, reglan & zofran. Urine drug screen was neg.  Appreciated GI consultation on 03/08/2022 and felt that her persistent nausea and vomiting were of unclear etiology and likely multifactorial.  They indicate that she had thorough GI evaluation in the month prior with EGD showing no evidence of significant reflux disease or-year-old esophagitis, no  G00, no malignancy.  She had multiple imaging modalities (RUQ [place], CT abdomen and pelvis, CTA chest and CT soft tissue neck) which were unrevealing as potential cause for her persisting symptoms.  Continue hold Reglan, IV PPI, as needed antiemetics.  SLP evaluated and recommend dysphagia 3 diet and thin liquids, trial and monitor.  Continue gentle IV fluids for additional 24 hours.   ? ?Chronic normocytic anemia: H&H are labile.  Hemoglobin drawn from 8.6 yesterday to 7.5 which may be dilutional.  Her hemoglobin has fluctuated in the 7-8 range.  Follow CBC daily and transfuse if hemoglobin 7 g or less.  No overt bleeding reported ?  ?Hx of Nissen fundoplication: appeared intact on EGD in February 2023. ?  ?COPD: w/o exacerbation. Continue on bronchodilators  ? ?Obesity: BMI 37.4. Complicates overall care & prognosis  ?  ?Bipolar 1 disorder: not on any meds for this as per med rec.  Unsure of her baseline mental status but currently not really responding appropriately to questions. ? ?Adult failure to thrive: Multifactorial due to recurrent hospitalization for GI issues as noted above, multiple severe significant medical problems, nonambulatory for a couple of months, lives in hotel with fianc?, reportedly has not been able to keep anything down for the last couple of months. ? ? ?DVT prophylaxis: lovenox ?Code Status: full  ?Family Communication: discussed pt's care w/ pt's brother and fianc? at bedside. ?Disposition Plan: depends on PT/OT recs.  Family wishes SNF admission for STR. ? ?Level of care: Telemetry Medical ? ?Status is: Inpatient ?Remains inpatient appropriate because: significant hypokalemia ? ? ? ? ?Consultants:  ? ? ?Procedures:  ? ?Antimicrobials:  ? ? ?Subjective: ?Seen patient along with fianc? and  brother at bedside.  Appears somewhat lethargic but wakes up to call.  Oriented to self.  As per family, may have not slept well last night and hence sleepy now.  They report that patient has been  nonambulatory for a couple of months, has not really been able to tolerate any oral intake with ongoing nausea and vomiting.  No overt bleeding reported by patient, family or RN. ? ?Objective: ?Vitals:  ? 03/29/22 0533 03/29/22 0732 03/29/22 0843 03/29/22 1155  ?BP: 133/70  (!) 147/79 123/70  ?Pulse: (!) 103  100 86  ?Resp: 16  18 18   ?Temp: 98.3 ?F (36.8 ?C)  98.2 ?F (36.8 ?C) 98 ?F (36.7 ?C)  ?TempSrc:      ?SpO2: 100% 99% 100% 100%  ?Weight:      ? ? ?Intake/Output Summary (Last 24 hours) at 03/29/2022 1545 ?Last data filed at 03/29/2022 1338 ?Gross per 24 hour  ?Intake 2083.78 ml  ?Output 100 ml  ?Net 1983.78 ml  ? ?Filed Weights  ? 03/28/22 0649  ?Weight: 115 kg  ? ? ?Examination: ? ?General exam: Middle-age female, moderately built and obese lying comfortably propped up in bed without distress. ?Respiratory system: Clear to auscultation. Respiratory effort normal. ?Cardiovascular system: S1 & S2 heard, RRR. No JVD, murmurs, rubs, gallops or clicks. No pedal edema.  Telemetry personally reviewed: Mostly sinus rhythm and occasional sinus tachycardia in the 120s. ?Gastrointestinal system: Abdomen is nondistended, soft and nontender. No organomegaly or masses felt. Normal bowel sounds heard. ?Central nervous system: Mental status as noted above, lethargic but arousable, oriented to self, follows some commands. No focal neurological deficits. ?Extremities: Symmetric 5 x 5 power. ?Skin: No rashes, lesions or ulcers ?Psychiatry: Judgement and insight impaired. Mood & affect flat.  ? ? ? ? ?Data Reviewed: I have personally reviewed following labs and imaging studies ? ?CBC: ?Recent Labs  ?Lab 03/27/22 ?2108 03/28/22 ?0904 03/29/22 ?ID:[id number]  ?WBC 5.1 4.8 5.3  ?HGB 8.0* 8.6* 7.5*  ?HCT 23.9* 26.6* 21.8*  ?MCV 81.0 82.1 79.6*  ?PLT 211 221 209  ? ?Basic Metabolic Panel: ?Recent Labs  ?Lab 03/27/22 ?2108 03/28/22 ?0904 03/29/22 ?ID:[id number]  ?NA 135 134* 136  ?K 2.3* 2.7* 3.1*  ?CL 91* 91* 97*  ?CO2 31 32 28  ?GLUCOSE 85 78 86  ?BUN  9 9 8   ?CREATININE 0.54 0.46 0.40*  ?CALCIUM 8.3* 8.1* 8.1*  ?MG  --  1.7 1.5*  ? ?GFR: ?Estimated Creatinine Clearance: 102.4 mL/min (A) (by C-G formula based on SCr of 0.4 mg/dL (L)). ?Liver Function Tests: ?Recent Labs  ?Lab 03/27/22 ?2108  ?AST 17  ?ALT 18  ?ALKPHOS 46  ?BILITOT 1.6*  ?PROT 6.0*  ?ALBUMIN 2.8*  ? ?Recent Labs  ?Lab 03/27/22 ?2108  ?LIPASE 24  ? ? ? ?Radiology Studies: ?CT Head Wo Contrast ? ?Result Date: 03/27/2022 ?CLINICAL DATA:  Altered mental status. EXAM: CT HEAD WITHOUT CONTRAST TECHNIQUE: Contiguous axial images were obtained from the base of the skull through the vertex without intravenous contrast. RADIATION DOSE REDUCTION: This exam was performed according to the departmental dose-optimization program which includes automated exposure control, adjustment of the mA and/or kV according to patient size and/or use of iterative reconstruction technique. COMPARISON:  Plain brain CT, dated November 13, 2021 and MR head dated March 08, 2022 FINDINGS: Brain: No evidence of acute infarction, hemorrhage, hydrocephalus, extra-axial collection or mass lesion/mass effect. 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 Vascular: No hyperdense vessel or unexpected calcification. Skull: Normal. Negative for fracture  or focal lesion. Sinuses/Orbits: No acute finding. Other: None. IMPRESSION: No acute intracranial abnormality. Electronically Signed   By: Virgina Norfolk M.D.   On: 03/27/2022 22:51  ? ?CT ABDOMEN PELVIS W CONTRAST ? ?Result Date: 03/27/2022 ?CLINICAL DATA:  Nausea and vomiting EXAM: CT ABDOMEN AND PELVIS WITH CONTRAST TECHNIQUE: Multidetector CT imaging of the abdomen and pelvis was performed using the standard protocol following bolus administration of intravenous contrast. RADIATION DOSE REDUCTION: This exam was performed according to the departmental dose-optimization program which includes automated exposure control, adjustment of the mA and/or kV according to patient size and/or use of  iterative reconstruction technique. CONTRAST:  181mL OMNIPAQUE IOHEXOL 350 MG/ML SOLN COMPARISON:  02/07/2022 FINDINGS: Lower chest: No acute pleural or parenchymal lung disease. Stable hiatal hernia. Hepatobiliary: H

## 2022-03-29 NOTE — Evaluation (Addendum)
Clinical/Bedside Swallow Evaluation ?Patient Details  ?Name: Tiffany [name] ?MRN: [id number] ?Date of Birth: 06/01/1961 ? ?Today's Date: 03/29/2022 ?Time: SLP Start Time (ACUTE ONLY): [id number] SLP Stop Time (ACUTE ONLY): [id number] ?SLP Time Calculation (min) (ACUTE ONLY): 20 min ? ?Past Medical History:  ?Past Medical History:  ?Diagnosis Date  ? Asthma   ? Bipolar 1 disorder ([place])   ? COPD (chronic obstructive pulmonary disease) ([place])   ? CVA (cerebral vascular accident) [facility])   ? Enlarged heart   ? GERD (gastroesophageal reflux disease)   ? Migraines   ? Sleep apnea   ? ?Past Surgical History:  ?Past Surgical History:  ?Procedure Laterality Date  ? ABDOMINAL HYSTERECTOMY    ? ABDOMINAL SURGERY    ? "For acid reflux"  ? CESAREAN SECTION    ? ESOPHAGOGASTRODUODENOSCOPY N/A 02/13/2022  ? Procedure: ESOPHAGOGASTRODUODENOSCOPY (EGD);  Surgeon: [place], Benay Pike, MD;  Location: [facility] ENDOSCOPY;  Service: Gastroenterology;  Laterality: N/A;  ? OTHER SURGICAL HISTORY  01/09/1998  ? acid reflux, hole in stomach they closed  ? ?HPI:  ?Tiffany [name] is a 61 y.o. female with medical history significant for Bipolar 1 disorder, COPD, hiatal hernia, history of Nissen fundoplication, frequent hospitalizations for refractory vomiting resulting in hypokalemia, most recently from 3/8 to 03/16/2022, and with recent upper endoscopy on 02/13/2022 that showed gastritis and a nonbleeding esophageal diverticulum who returns to the ED with intractable nausea and vomiting. Pt is currently NPO with ice chips. CT Head 03/27/22: No acute intracranial abnormality CXR 03/27/22: No acute intrathoracic process. CT Abdomen 03/27/22: 1. Gallbladder sludge. No evidence of cholelithiasis or acute  cholecystitis.  2. Stable mild dilation of the appendix, without inflammatory  changes to suggest appendicitis.  3. Stable hiatal hernia. Pt resting sideling in bed and agreeable to partial HOB elevation.  ?  ?Assessment / Plan / Recommendation  ?Clinical Impression ?  Pt presents with overtly mild oral dysphagia, with suspected esophageal component. Oral phase mildly prolonged, with impaired mastication and moderate oral residue. Liquid wash and extended time aided oral clearance with regular solids. Increased efficiency for soft solids/puree. Pharyngeal phase unremarkable for s/sx of aspiration across trials. Pt with esophageal hx (hiatal hernia, esophagitis, and diverticulum) and esophageal symptoms (belching and gagging) during trials. Based on results of assessment, recommend Mech Soft solids and thin liquids with general aspiration precautions (slow rate, small bites, elevated HOB, alert for PO intake) and esophageal precautions (elevated HOB 30 min following intake, alternate solids/liquids, and small meals). Recommend GI follow up. SLP will follow up to assess management for solids. ?SLP Visit Diagnosis: Dysphagia, oral phase (R13.11);Dysphagia, unspecified (R13.10) ?   ?Aspiration Risk ? Mild aspiration risk  ?  ?Diet Recommendation  Mech Soft Solids and Thin Liquids ? ?Medication Administration: Whole meds with liquid  ?  ?Other  Recommendations Recommended Consults: Consider GI evaluation ?Oral Care Recommendations: Oral care BID   ? ?Recommendations for follow up therapy are one component of a multi-disciplinary discharge planning process, led by the attending physician.  Recommendations may be updated based on patient status, additional functional criteria and insurance authorization. ? ?Follow up Recommendations  (tbd)  ? ? ?  ?Assistance Recommended at Discharge Frequent or constant Supervision/Assistance  ?Functional Status Assessment Patient has had a recent decline in their functional status and/or demonstrates limited ability to make significant improvements in function in a reasonable and predictable amount of time  ?Frequency and Duration min 1 x/week  ?2 weeks ?  ?   ? ?Prognosis Prognosis  for Safe Diet Advancement: Fair ?Barriers to Reach Goals: Cognitive  deficits;Motivation  ? ?  ? ?Swallow Study   ?General Date of Onset: 03/29/22 ?HPI: Tiffany [name] is a 61 y.o. female with medical history significant for Bipolar 1 disorder, COPD, hiatal hernia, history of Nissen fundoplication, frequent hospitalizations for refractory vomiting resulting in hypokalemia, most recently from 3/8 to 03/16/2022, and with recent upper endoscopy on 02/13/2022 that showed gastritis and a nonbleeding esophageal diverticulum who returns to the ED with intractable nausea and vomiting. Pt is currently NPO with ice chips. CT Head 03/27/22: No acute intracranial abnormality CXR 03/27/22: No acute intrathoracic process. CT Abdomen 03/27/22: 1. Gallbladder sludge. No evidence of cholelithiasis or acute  cholecystitis.  2. Stable mild dilation of the appendix, without inflammatory  changes to suggest appendicitis.  3. Stable hiatal hernia. Pt resting sidelying in bed and agreeable to partial HOB elevation. ?Type of Study: Bedside Swallow Evaluation ?Previous Swallow Assessment: none in chart ?Diet Prior to this Study: NPO ?Temperature Spikes Noted: No (5.3 WBC) ?Respiratory Status: Room air ?History of Recent Intubation: No ?Behavior/Cognition: Lethargic/Drowsy;Requires cueing ?Oral Cavity Assessment: Dry ?Oral Care Completed by SLP: Yes ?Oral Cavity - Dentition: Missing dentition (partial bottom dentition; edentulous top. Pt denies having dentures.) ?Self-Feeding Abilities: Needs assist ?Patient Positioning: Partially reclined ?Baseline Vocal Quality: Low vocal intensity ?Volitional Cough: Weak ?Volitional Swallow: Unable to elicit  ?  ?Oral/Motor/Sensory Function Overall Oral Motor/Sensory Function: Mild impairment ?Facial ROM: Within Functional Limits ?Facial Symmetry: Within Functional Limits ?Facial Strength: Within Functional Limits ?Facial Sensation: Within Functional Limits ?Lingual ROM: Reduced left ?Lingual Symmetry: Within Functional Limits ?Lingual Strength: Reduced ?Lingual Sensation:  Within Functional Limits ?Velum: Within Functional Limits ?Mandible: Within Functional Limits   ?Ice Chips Ice chips: Within functional limits ?Presentation: Spoon   ?Thin Liquid Thin Liquid: Within functional limits ?Presentation: Straw  ?  ?Nectar Thick Nectar Thick Liquid: Not tested   ?Honey Thick Honey Thick Liquid: Not tested   ?Puree Puree: Within functional limits ?Presentation: Spoon   ?Solid ? ? ?  Solid: Impaired ?Presentation:  (therapist fed) ?Oral Phase Impairments: Reduced lingual movement/coordination;Impaired mastication ?Oral Phase Functional Implications: Impaired mastication;Oral residue  ? ?  ? ?[place] J Clapp ?03/29/2022,9:25 AM ? ? ? ?

## 2022-03-29 NOTE — Evaluation (Signed)
Physical Therapy Evaluation ?Patient Details ?Name: Tiffany [name] ?MRN: [id number] ?DOB: 30-Mar-1961 ?Today's Date: 03/29/2022 ? ?History of Present Illness ? Pt is a 61 y.o. female presenting to hospital 3/28 d/t vomiting a lot, losing weight, and not speaking as much as usual.  Pt admitted with hypokalemia d/t excessive gastrointestinal loss of potassium, and refractory N/V.  PMH includes h/o hiatal hernia, Nissen fundoplication, grade D esophagitis, bipolar 1 disorder, COPD, enlarged heart, abdominal sx, stroke in November 2022.  ?Clinical Impression ? Prior to hospital admission, per chart pt has been bed-bound since Nov 2022 but pt reports she was able to get OOB and transfer to w/c with RW use and assist (pt unable to quantify amount of assist).  Pt intermittently talking and answering questions during session.  Lives with her significant other in motel (1st floor).  Currently pt is max assist with bed mobility and sitting balance (posterior lean noted in sitting).  Pt asking to get OOB and initiating to get OOB but then pt demonstrating limited initiation to assist with activities after that.  Pt would benefit from skilled PT to address noted impairments and functional limitations (see below for any additional details).  Upon hospital discharge, pt would benefit from SNF trial.   ? ?Recommendations for follow up therapy are one component of a multi-disciplinary discharge planning process, led by the attending physician.  Recommendations may be updated based on patient status, additional functional criteria and insurance authorization. ? ?Follow Up Recommendations Skilled nursing-short term rehab (<3 hours/day) (trial) ? ?  ?Assistance Recommended at Discharge Frequent or constant Supervision/Assistance  ?Patient can return home with the following ? Two people to help with walking and/or transfers;Two people to help with bathing/dressing/bathroom;Assistance with cooking/housework;Direct supervision/assist for  medications management;Assist for transportation;Help with stairs or ramp for entrance ? ?  ?Equipment Recommendations Hospital bed;Other (comment) (hoyer lift)  ?Recommendations for Other Services ?    ?  ?Functional Status Assessment Patient has had a recent decline in their functional status and/or demonstrates limited ability to make significant improvements in function in a reasonable and predictable amount of time  ? ?  ?Precautions / Restrictions Precautions ?Precautions: Fall ?Precaution Comments: Aspiration ?Restrictions ?Weight Bearing Restrictions: No  ? ?  ? ?Mobility ? Bed Mobility ?Overal bed mobility: Needs Assistance ?Bed Mobility: Supine to Sit, Sit to Supine ?Rolling: Max assist (logrolling L and R in bed) ?  ?Supine to sit: Max assist ?Sit to supine: Max assist ?  ?General bed mobility comments: assist for trunk and B LE's; 2 assist to boost up in bed end of session using bed sheet ?  ? ?Transfers ?  ?  ?  ?  ?  ?  ?  ?  ?  ?General transfer comment: not appropriate at this time ?  ? ?Ambulation/Gait ?  ?  ?  ?  ?  ?  ?  ?General Gait Details: not appropriate at this time (pt recently non-ambulatory) ? ?Stairs ?  ?  ?  ?  ?  ? ?Wheelchair Mobility ?  ? ?Modified Rankin (Stroke Patients Only) ?  ? ?  ? ?Balance Overall balance assessment: Needs assistance ?Sitting-balance support: Bilateral upper extremity supported, Feet supported ?Sitting balance-Leahy Scale: Poor ?Sitting balance - Comments: requiring max assist for sitting balance d/t posterior lean ?  ?  ?  ?  ?  ?  ?  ?  ?  ?  ?  ?  ?  ?  ?  ?   ? ? ? ?  Pertinent Vitals/Pain Pain Assessment ?Pain Assessment: Faces ?Faces Pain Scale: No hurt ?Pain Intervention(s): Limited activity within patient's tolerance, Monitored during session, Repositioned ?Vitals (HR and O2 on room air) stable and WFL throughout treatment session.  ? ? ?Home Living Family/patient expects to be discharged to:: Private residence ?Living Arrangements: Spouse/significant  other (Significant other "Eddie") ?Available Help at Discharge: Family;Available 24 hours/day ?Type of Home: Other(Comment) (motel) ?Home Access: Level entry ?  ?  ?  ?Home Layout: One level ?Home Equipment: Agricultural consultant (2 wheels);Wheelchair - manual ?   ?  ?Prior Function Prior Level of Function : Needs assist;History of Falls (last six months) ?  ?  ?  ?  ?  ?  ?Mobility Comments: Per chart pt has been bed bound since November 2022; pt reports being able to get OOB and transfer to w/c using RW with assist ?ADLs Comments: Per chart pt has been heavy assist for all ADL at bed level since November 2022. ?  ? ? ?Hand Dominance  ?   ? ?  ?Extremity/Trunk Assessment  ? Upper Extremity Assessment ?Upper Extremity Assessment: Generalized weakness (L UE appearing weaker than R UE (pt reports d/t h/o stroke); fair B hand grip strength; at least 3/5 AROM elbow flexion/extension; R shoulder flexion grossly 70 degrees AROM; L shoulder flexion minimal AROM.) ?  ? ?Lower Extremity Assessment ?Lower Extremity Assessment:  (B DF PROM grossly 10 degrees short of neutral; at least 3/5 AROM R DF/PF and 3-/5 L DF/PF; pt did not follow cues for hip and knee strength testing) ?  ? ?Cervical / Trunk Assessment ?Cervical / Trunk Assessment: Other exceptions ?Cervical / Trunk Exceptions: forward head/shoulders  ?Communication  ? Communication: HOH (Hears better out of R ear than L)  ?Cognition Arousal/Alertness: Awake/alert ?Behavior During Therapy: Flat affect ?  ?  ?  ?  ?  ?  ?  ?  ?  ?  ?  ?  ?  ?  ?  ?  ?  ?General Comments: Oriented to at least person and that it is Thursday (pt did not answer rest of questions) ?  ?  ? ?  ?General Comments  Nursing cleared pt for participation in physical therapy.  Pt agreeable to PT session. ? ?  ?Exercises  Bed mobility  ? ?Assessment/Plan  ?  ?PT Assessment Patient needs continued PT services  ?PT Problem List Decreased strength;Decreased activity tolerance;Decreased balance;Decreased  mobility ? ?   ?  ?PT Treatment Interventions DME instruction;Functional mobility training;Therapeutic activities;Therapeutic exercise;Balance training;Patient/family education   ? ?PT Goals (Current goals can be found in the Care Plan section)  ?Acute Rehab PT Goals ?Patient Stated Goal: to be able to transfer to w/c ?PT Goal Formulation: With patient ?Time For Goal Achievement: 04/12/22 ?Potential to Achieve Goals: Fair ? ?  ?Frequency Min 2X/week ?  ? ? ?Co-evaluation   ?  ?  ?  ?  ? ? ?  ?AM-PAC PT "6 Clicks" Mobility  ?Outcome Measure Help needed turning from your back to your side while in a flat bed without using bedrails?: A Lot ?Help needed moving from lying on your back to sitting on the side of a flat bed without using bedrails?: A Lot ?Help needed moving to and from a bed to a chair (including a wheelchair)?: Total ?Help needed standing up from a chair using your arms (e.g., wheelchair or bedside chair)?: Total ?Help needed to walk in hospital room?: Total ?Help needed climbing 3-5 steps with a railing? :  Total ?6 Click Score: 8 ? ?  ?End of Session   ?Activity Tolerance: Patient limited by fatigue ?Patient left: in bed;with call bell/phone within reach;with bed alarm set (B heels floating via pillow support; B UE's elevated on pillows) ?Nurse Communication: Mobility status;Precautions ?PT Visit Diagnosis: Muscle weakness (generalized) (M62.81);Other abnormalities of gait and mobility (R26.89) ?  ? ?Time: 4235-3614 ?PT Time Calculation (min) (ACUTE ONLY): 27 min ? ? ?Charges:   PT Evaluation ?$PT Eval Low Complexity: 1 Low ?PT Treatments ?$Therapeutic Activity: 8-22 mins ?  ?   ? ?Hendricks Limes, PT ?03/29/22, 3:44 PM ? ? ?

## 2022-03-30 DIAGNOSIS — E876 Hypokalemia: Secondary | ICD-10-CM | POA: Diagnosis not present

## 2022-03-30 LAB — BASIC METABOLIC PANEL
Anion gap: 8 (ref 5–15)
BUN: 7 mg/dL (ref 6–20)
CO2: 28 mmol/L (ref 22–32)
Calcium: 8 mg/dL — ABNORMAL LOW (ref 8.9–10.3)
Chloride: 100 mmol/L (ref 98–111)
Creatinine, Ser: 0.49 mg/dL (ref 0.44–1.00)
GFR, Estimated: >60 mL/min (ref >60)
Glucose, Bld: 87 mg/dL (ref 70–99)
Potassium: 4.3 mmol/L (ref 3.5–5.1)
Sodium: 136 mmol/L (ref 135–145)

## 2022-03-30 LAB — CBC
HCT: 22 % — ABNORMAL LOW (ref 36.0–46.0)
Hemoglobin: 7.3 g/dL — ABNORMAL LOW (ref 12.0–15.0)
MCH: 26.6 pg (ref 26.0–34.0)
MCHC: 33.2 g/dL (ref 30.0–36.0)
MCV: 80.3 fL (ref 80.0–100.0)
Platelets: 245 10*3/uL (ref 150–400)
RBC: 2.74 MIL/uL — ABNORMAL LOW (ref 3.87–5.11)
RDW: 16.9 % — ABNORMAL HIGH (ref 11.5–15.5)
WBC: 5 10*3/uL (ref 4.0–10.5)
nRBC: 1.2 % — ABNORMAL HIGH (ref 0.0–0.2)

## 2022-03-30 MED ORDER — PANTOPRAZOLE SODIUM 40 MG PO TBEC
40.0000 mg | DELAYED_RELEASE_TABLET | Freq: Every day | ORAL | Status: DC
  Administered 2022-04-02 – 2022-04-04 (×3): 40 mg via ORAL
  Filled 2022-03-30 (×5): qty 1

## 2022-03-30 MED ORDER — ADULT MULTIVITAMIN W/MINERALS CH
1.0000 | ORAL_TABLET | Freq: Every day | ORAL | Status: DC
  Administered 2022-03-30 – 2022-04-04 (×4): 1 via ORAL
  Filled 2022-03-30 (×6): qty 1

## 2022-03-30 MED ORDER — METOCLOPRAMIDE HCL 5 MG/ML IJ SOLN: 10.0000 mg | Freq: Four times a day (QID) | INTRAMUSCULAR | Status: DC | PRN

## 2022-03-30 NOTE — Progress Notes (Signed)
Physical Therapy Treatment ?Patient Details ?Name: Tiffany [name] ?MRN: [id number] ?DOB: 1961/01/30 ?Today's Date: 03/30/2022 ? ? ?History of Present Illness Pt is a 61 y.o. female presenting to hospital 3/28 d/t vomiting a lot, losing weight, and not speaking as much as usual.  Pt admitted with hypokalemia d/t excessive gastrointestinal loss of potassium, and refractory N/V.  PMH includes h/o hiatal hernia, Nissen fundoplication, grade D esophagitis, bipolar 1 disorder, COPD, enlarged heart, abdominal sx, stroke in November 2022. ? ?  ?PT Comments  ? ? Pt resting in bed upon PT arrival; pt immediately asking to sit up on edge of bed.  Pt requiring cueing to not push backwards in sitting which allowed pt to then sit up with assist but pt still requiring max assist d/t posterior lean in sitting (limited time sitting d/t pt fatigue).  Pt then performed B LE ex's in bed with assist but limited ex's d/t pt fatigue (pt stopped assisting with ex's).  Will continue to focus on strengthening, sitting balance, and progressive functional mobility per pt tolerance. ?   ?Recommendations for follow up therapy are one component of a multi-disciplinary discharge planning process, led by the attending physician.  Recommendations may be updated based on patient status, additional functional criteria and insurance authorization. ? ?Follow Up Recommendations ? Skilled nursing-short term rehab (<3 hours/day) (trial) ?  ?  ?Assistance Recommended at Discharge Frequent or constant Supervision/Assistance  ?Patient can return home with the following Two people to help with walking and/or transfers;Two people to help with bathing/dressing/bathroom;Assistance with cooking/housework;Direct supervision/assist for medications management;Assist for transportation;Help with stairs or ramp for entrance ?  ?Equipment Recommendations ? Hospital bed;Other (comment) (hoyer lift)  ?  ?Recommendations for Other Services   ? ? ?  ?Precautions /  Restrictions Precautions ?Precautions: Fall ?Precaution Comments: Aspiration ?Restrictions ?Weight Bearing Restrictions: No  ?  ? ?Mobility ? Bed Mobility ?Overal bed mobility: Needs Assistance ?Bed Mobility: Supine to Sit, Sit to Supine ?  ?  ?Supine to sit: Max assist ?Sit to supine: Max assist ?  ?General bed mobility comments: assist for trunk and B LE's ?  ? ?Transfers ?  ?  ?  ?  ?  ?  ?  ?  ?  ?General transfer comment: not appropriate at this time ?  ? ?Ambulation/Gait ?  ?  ?  ?  ?  ?  ?  ?General Gait Details: not appropriate at this time (pt recently non-ambulatory) ? ? ?Stairs ?  ?  ?  ?  ?  ? ? ?Wheelchair Mobility ?  ? ?Modified Rankin (Stroke Patients Only) ?  ? ? ?  ?Balance Overall balance assessment: Needs assistance ?Sitting-balance support: Bilateral upper extremity supported, Feet supported ?Sitting balance-Leahy Scale: Poor ?Sitting balance - Comments: requiring max assist for sitting balance d/t posterior lean ?  ?  ?  ?  ?  ?  ?  ?  ?  ?  ?  ?  ?  ?  ?  ?  ? ?  ?Cognition Arousal/Alertness: Awake/alert ?Behavior During Therapy: Flat affect ?  ?  ?  ?  ?  ?  ?  ?  ?  ?  ?  ?  ?  ?  ?  ?  ?  ?General Comments: Oriented to at least person (pt did not answer rest of questions) ?  ?  ? ?  ?Exercises General Exercises - Lower Extremity ?Ankle Circles/Pumps: AROM, Strengthening, Both, 10 reps, Supine ?Heel Slides: AAROM, Strengthening, Both,  10 reps, Supine ?Hip ABduction/ADduction: AAROM, Strengthening, Both, 10 reps, Supine ? ?  ?General Comments  Nursing cleared pt for participation in physical therapy.  Pt agreeable to PT session. ?  ?  ? ?Pertinent Vitals/Pain Pain Assessment ?Pain Assessment: Faces ?Faces Pain Scale: No hurt ?Pain Intervention(s): Limited activity within patient's tolerance, Monitored during session, Repositioned ?Vitals (HR and O2 on room air) stable and WFL throughout treatment session.  ? ? ?Home Living   ?  ?  ?  ?  ?  ?  ?  ?  ?  ?   ?  ?Prior Function    ?  ?  ?   ? ?PT  Goals (current goals can now be found in the care plan section) Acute Rehab PT Goals ?Patient Stated Goal: to be able to transfer to w/c ?PT Goal Formulation: With patient ?Time For Goal Achievement: 04/12/22 ?Potential to Achieve Goals: Fair ?Progress towards PT goals: Progressing toward goals ? ?  ?Frequency ? ? ? Min 2X/week ? ? ? ?  ?PT Plan Current plan remains appropriate  ? ? ?Co-evaluation   ?  ?  ?  ?  ? ?  ?AM-PAC PT "6 Clicks" Mobility   ?Outcome Measure ? Help needed turning from your back to your side while in a flat bed without using bedrails?: A Lot ?Help needed moving from lying on your back to sitting on the side of a flat bed without using bedrails?: A Lot ?Help needed moving to and from a bed to a chair (including a wheelchair)?: Total ?Help needed standing up from a chair using your arms (e.g., wheelchair or bedside chair)?: Total ?Help needed to walk in hospital room?: Total ?Help needed climbing 3-5 steps with a railing? : Total ?6 Click Score: 8 ? ?  ?End of Session   ?Activity Tolerance: Patient limited by fatigue ?Patient left: in bed;with call bell/phone within reach;with bed alarm set;Other (comment) (B heels floating via pillow support) ?Nurse Communication: Mobility status;Precautions ?PT Visit Diagnosis: Muscle weakness (generalized) (M62.81);Other abnormalities of gait and mobility (R26.89) ?  ? ? ?Time: 1557-1610 ?PT Time Calculation (min) (ACUTE ONLY): 13 min ? ?Charges:  $Therapeutic Exercise: 8-22 mins          ?          ?Hendricks Limes, PT ?03/30/22, 4:58 PM ? ? ?

## 2022-03-30 NOTE — [place] FL2 (Signed)
? MEDICAID FL2 LEVEL OF CARE SCREENING TOOL  ?  ? ?IDENTIFICATION  ?Patient Name: ?Tiffany [name] Birthdate: Feb 24, 1961 Sex: female Admission Date (Current Location): ?03/27/2022  ?[place] Number: ? [place] ?  Facility and Address:  ?[facility], [address] [place], [place] [address] ?     Provider Number: ?[id number]  ?Attending Physician Name and Address:  ?Burnadette Pop, MD ? Relative Name and Phone Number:  ?Cleophas Dunker (POA),Clifford (Brother)   [phone] (Mobile) ?   ?Current Level of Care: ?Hospital Recommended Level of Care: ?Skilled Nursing Facility Prior Approval Number: ?  ? ?Date Approved/Denied: ?  PASRR Number: ?[id number] ? ?Discharge Plan: ?  ?  ? ?Current Diagnoses: ?Patient Active Problem List  ? Diagnosis Date Noted  ? Chronic anemia 03/28/2022  ? Hypokalemia due to excessive gastrointestinal loss of potassium 03/07/2022  ? History of Nissen fundoplication 03/01/2022  ? UTI (urinary tract infection) 02/24/2022  ? Angioedema 02/21/2022  ? HLD (hyperlipidemia) 02/21/2022  ? COPD (chronic obstructive pulmonary disease) (HCC) 02/21/2022  ? Stroke-like symptom 02/21/2022  ? Migraine 02/21/2022  ? Obesity (BMI 30-39.9) 02/21/2022  ? Depression 02/21/2022  ? Homeless 02/21/2022  ? Inadequate oral intake 02/13/2022  ? History of esophagitis 02/12/2022  ? Hiatal hernia 02/12/2022  ? Obesity, Class III, BMI 40-49.9 (morbid obesity) (HCC) 02/11/2022  ? Pressure injury of skin 02/08/2022  ? Hypomagnesemia 02/08/2022  ? Hyponatremia 02/08/2022  ? Bipolar 1 disorder (HCC) 02/08/2022  ? Asthma, chronic 02/08/2022  ? History of migraine 02/08/2022  ? Dyslipidemia 02/08/2022  ? Intractable nausea and vomiting 02/07/2022  ? Hypoxia   ? Acute respiratory failure with hypoxia and hypercarbia (HCC)   ? Ambulatory dysfunction   ? Primary hypertension   ? Encephalopathy 11/13/2021  ? Hypokalemia 10/29/2021  ? Frequent falls 10/29/2021  ? Refractory nausea and  vomiting 10/29/2021  ? Thrombocytopenia (HCC) 10/29/2021  ? GERD (gastroesophageal reflux disease) 10/29/2021  ? ? ?Orientation RESPIRATION BLADDER Height & Weight   ?  ?Self, Time, Place, Situation ? Normal External catheter Weight: 253 lb 8.5 oz (115 kg) ?Height:     ?BEHAVIORAL SYMPTOMS/MOOD NEUROLOGICAL BOWEL NUTRITION STATUS  ?    Incontinent Diet (dys 3)  ?AMBULATORY STATUS COMMUNICATION OF NEEDS Skin   ?Extensive Assist Verbally Other (Comment) (dry) ?  ?  ?  ?    ?     ?     ? ? ?Personal Care Assistance Level of Assistance  ?Bathing, Feeding, Dressing Bathing Assistance: Maximum assistance ?Feeding assistance: Limited assistance ?Dressing Assistance: Maximum assistance ?   ? ?Functional Limitations Info  ?    ?  ?   ? ? ?SPECIAL CARE FACTORS FREQUENCY  ?PT (By licensed PT), OT (By licensed OT)   ?  ?PT Frequency: 5 times per week ?OT Frequency: 5 times per week ?  ?  ?  ?   ? ? ?Contractures    ? ? ?Additional Factors Info  ?Code Status, Allergies Code Status Info: full ?Allergies Info: dairy aid tilactase, eggs or egg derived products, milk related compounds ?  ?  ?  ?   ? ?Current Medications (03/30/2022):  This is the current hospital active medication list ?Current Facility-Administered Medications  ?Medication Dose Route Frequency Provider Last Rate Last Admin  ? acetaminophen (TYLENOL) tablet 650 mg  650 mg Oral Q6H PRN Andris Baumann, MD      ? Or  ? acetaminophen (TYLENOL) suppository 650 mg  650 mg Rectal Q6H  PRN Andris Baumann, MD      ? budesonide (PULMICORT) nebulizer solution 0.25 mg  0.25 mg Nebulization BID Charise Killian, MD   0.25 mg at 03/30/22 4825  ? enoxaparin (LOVENOX) injection 57.5 mg  0.5 mg/kg Subcutaneous Q24H Andris Baumann, MD   57.5 mg at 03/29/22 2144  ? ipratropium-albuterol (DUONEB) 0.5-2.5 (3) MG/3ML nebulizer solution 3 mL  3 mL Nebulization Q4H PRN Andris Baumann, MD      ? metoCLOPramide (REGLAN) injection 10 mg  10 mg Intravenous Q6H PRN Burnadette Pop, MD       ? ondansetron (ZOFRAN) tablet 4 mg  4 mg Oral Q6H PRN Andris Baumann, MD      ? Or  ? ondansetron [facility]) injection 4 mg  4 mg Intravenous Q6H PRN Andris Baumann, MD      ? Melene Muller ON 03/31/2022] pantoprazole (PROTONIX) EC tablet 40 mg  40 mg Oral Daily Adhikari, Amrit, MD      ? prochlorperazine (COMPAZINE) injection 10 mg  10 mg Intravenous Q6H PRN Andris Baumann, MD      ? tiotropium [facility]) inhalation capsule ([facility] use ONLY) 18 mcg  1 capsule Inhalation QHS Andris Baumann, MD   18 mcg at 03/29/22 2145  ? ? ? ?Discharge Medications: ?Please see discharge summary for a list of discharge medications. ? ?Relevant Imaging Results: ? ?Relevant Lab Results: ? ? ?Additional Information ?SS #: [id number] , SNF with possible long term. Has Medicaid. ? ?Jarielys Girardot E Mindie Rawdon, LCSW ? ? ? ? ?

## 2022-03-30 NOTE — TOC Initial Note (Signed)
Transition of Care (TOC) - Initial/Assessment Note  ? ? ?Patient Details  ?Name: Tiffany [name] ?MRN: [id number] ?Date of Birth: November 09, 1961 ? ?Transition of Care (TOC) CM/SW Contact:    ?Ashari Llewellyn E Tannen Vandezande, LCSW ?Phone Number: ?03/30/2022, 2:27 PM ? ?Clinical Narrative:                Spoke to patient's brother/POA Clifford via phone. ?Patient is from [place] where she had been living with her significant other/caregiver. Hinton Dyer stated patient and family have decided patient needs to go to SNF to get stronger. He stated patient went to [facility] last year. Hinton Dyer stated they would prefer patient go somewhere in [facility], but understands it is based on availability. Hinton Dyer stated he understands patient's check would be taken while at [facility] under her Medicaid. Hinton Dyer stated their ultimate goal would be for patient to return home/to an apartment once she got stronger, but she may need to stay at SNF longer term.  ?CSW is starting SNF work up.  ? ? ?Expected Discharge Plan: Skilled Nursing Facility ?Barriers to Discharge: Continued Medical Work up ? ? ?Patient Goals and CMS Choice ?Patient states their goals for this hospitalization and ongoing recovery are:: SNF ?CMS Medicare.gov Compare Post Acute Care list provided to:: Patient Represenative (must comment) ?Choice offered to / list presented to : Sibling, [facility] POA / Guardian ? ?Expected Discharge Plan and Services ?Expected Discharge Plan: Skilled Nursing Facility ?  ?  ?  ?Living arrangements for the past 2 months: Hotel/Motel ?                ?  ?  ?  ?  ?  ?  ?  ?  ?  ?  ? ?Prior Living Arrangements/Services ?Living arrangements for the past 2 months: Hotel/Motel ?Lives with:: Significant Other ?Patient language and need for interpreter reviewed:: Yes ?Do you feel safe going back to the place where you live?: Yes      ?Need for Family Participation in Patient Care: Yes (Comment) ?Care giver support system in place?: Yes  (comment) ?Current home services: DME ?Criminal Activity/Legal Involvement Pertinent to Current Situation/Hospitalization: No - Comment as needed ? ?Activities of Daily Living ?  ?ADL Screening (condition at time of admission) ?Patient's cognitive ability adequate to safely complete daily activities?: Yes ?Is the patient deaf or have difficulty hearing?: No ?Does the patient have difficulty seeing, even when wearing glasses/contacts?: No ? ?Permission Sought/Granted ?Permission sought to share information with : Magazine features editor ?Permission granted to share information with : Yes, Verbal Permission Granted (by POA) ?   ? Permission granted to share info w AGENCY: SNFs ?   ?   ? ?Emotional Assessment ?  ?  ?  ?  ?Alcohol / Substance Use: Not Applicable ?Psych Involvement: No (comment) ? ?Admission diagnosis:  Hypokalemia [E87.6] ?Abnormal EKG [R94.31] ?Weight loss [R63.4] ?Nausea and vomiting, unspecified vomiting type [R11.2] ?Patient Active Problem List  ? Diagnosis Date Noted  ? Chronic anemia 03/28/2022  ? Hypokalemia due to excessive gastrointestinal loss of potassium 03/07/2022  ? History of Nissen fundoplication 03/01/2022  ? UTI (urinary tract infection) 02/24/2022  ? Angioedema 02/21/2022  ? HLD (hyperlipidemia) 02/21/2022  ? COPD (chronic obstructive pulmonary disease) (HCC) 02/21/2022  ? Stroke-like symptom 02/21/2022  ? Migraine 02/21/2022  ? Obesity (BMI 30-39.9) 02/21/2022  ? Depression 02/21/2022  ? Homeless 02/21/2022  ? Inadequate oral intake 02/13/2022  ? History of esophagitis 02/12/2022  ? Hiatal hernia 02/12/2022  ?  Obesity, Class III, BMI 40-49.9 (morbid obesity) (HCC) 02/11/2022  ? Pressure injury of skin 02/08/2022  ? Hypomagnesemia 02/08/2022  ? Hyponatremia 02/08/2022  ? Bipolar 1 disorder (HCC) 02/08/2022  ? Asthma, chronic 02/08/2022  ? History of migraine 02/08/2022  ? Dyslipidemia 02/08/2022  ? Intractable nausea and vomiting 02/07/2022  ? Hypoxia   ? Acute respiratory  failure with hypoxia and hypercarbia (HCC)   ? Ambulatory dysfunction   ? Primary hypertension   ? Encephalopathy 11/13/2021  ? Hypokalemia 10/29/2021  ? Frequent falls 10/29/2021  ? Refractory nausea and vomiting 10/29/2021  ? Thrombocytopenia (HCC) 10/29/2021  ? GERD (gastroesophageal reflux disease) 10/29/2021  ? ?PCP:  Eddie Dibbles, MD ?Pharmacy:   ?[id number] - [place] ([place] - [address] ?[address] ?[place] [address] ?Phone: [phone] Fax: [phone] ? ? ? ? ?Social Determinants of Health (SDOH) Interventions ?  ? ?Readmission Risk Interventions ? ?  03/30/2022  ?  2:25 PM 11/08/2021  ? 12:38 PM  ?Readmission Risk Prevention Plan  ?Transportation Screening Complete Complete  ?PCP or Specialist Appt within 5-7 Days  Complete  ?Home Care Screening  Complete  ?Medication Review (RN CM)  Complete  ?Medication Review Oceanographer) Complete   ?PCP or Specialist appointment within 3-5 days of discharge Complete   ?HRI or Home Care Consult Complete   ?SW Recovery Care/Counseling Consult Complete   ?Palliative Care Screening Not Applicable   ?Skilled Nursing Facility Complete   ? ? ? ?

## 2022-03-30 NOTE — Progress Notes (Signed)
?PROGRESS NOTE ? ?Tiffany [name]  [id number] DOB: 1961-07-13 DOA: 03/27/2022 ?PCP: Nelle Don, MD  ? ?Brief Narrative: ? Tiffany [name] is a 61 y.o. female with medical history significant for Bipolar 1 disorder, COPD, hiatal hernia, history of Nissen fundoplication, frequent hospitalizations for refractory vomiting resulting in hypokalemia, most recently from 3/8 to 03/16/2022, and with recent upper endoscopy on 02/13/2022 that showed gastritis and a nonbleeding esophageal diverticulum who returns to the ED with intractable nausea and vomiting. CT abdomen and pelvis showing gallbladder sludge with no evidence of cholelithiasis or acute cholecystitis.  Chest x-ray nonacute as well as head CT.  Admitted for refractory nausea, vomiting, severe hypokalemia and failure to thrive.  Patient states with his boyfriend/finances.  Currently they are homeless.  Patient has chronic debility, unable to ambulate since November 2022.  PT recommending skilled nursing facility on discharge.  TOC following ? ?Assessment & Plan: ? ?Principal Problem: ?  Hypokalemia due to excessive gastrointestinal loss of potassium ?Active Problems: ?  Refractory nausea and vomiting ?  Hypokalemia ?  Bipolar 1 disorder ([place]) ?  Obesity, Class III, BMI 40-49.9 (morbid obesity) ([place]) ?  COPD (chronic obstructive pulmonary disease) ([place]) ?  History of Nissen fundoplication ?  Chronic anemia ? ? ?Hypokalemia: likely secondary to GI losses and poor oral intake.  Presented with potassium of 2.3.  Potassium level at 4.3 today.  Continue monitoring ?  ?Hypomagnesemia: Magnesium 1.5.  Replaced and collected  ? ?refractory nausea and vomiting: etiology unclear, possible gastroparesis. EGD on 02/13/22 w/ gastritis. Continue on PPI, reglan & zofran. Urine drug screen was neg.   GI consultation on 03/08/2022 and felt that her persistent nausea and vomiting were of unclear etiology and likely multifactorial.  They indicate that she had thorough GI  evaluation in the month prior with EGD showing no evidence of significant reflux disease or-year-old esophagitis, no gastric outlet obstruction, no malignancy.  She had multiple imaging modalities (RUQ [place], CT abdomen and pelvis, CTA chest and CT soft tissue neck) which were unrevealing as potential cause for her persisting symptoms.  Continue hold Reglan, continue PPI, as needed antiemetics.  SLP evaluated and recommend d mechanical soft diet, thin liquid.  Will discontinue IV fluids. ?This morning she was not nauseous, no vomiting ? ?Chronic normocytic anemia: Stable, her hemoglobin has fluctuated in the 7-8 range.  Follow CBC intermittently and transfuse if hemoglobin 7 g or less.  No overt bleeding reported ?  ?Hx of Nissen fundoplication: appeared intact on EGD in February 2023. ?  ?COPD: w/o exacerbation. Continue on bronchodilators as needed ?  ?Obesity: BMI 37.4.  ?  ?Bipolar 1 disorder: not on any meds for this as per med rec.  Unsure of her baseline mental status but currently not really responding appropriately to questions. ?  ?Adult failure to thrive: Multifactorial due to recurrent hospitalization for GI issues as noted above, multiple severe significant medical problems, nonambulatory for a couple of months, lives in hotel with fianc?, reportedly has not been able to keep anything down for the last couple of months. ? ?Debility/disposition: PT/OT recommending skilled nursing facility on discharge.  Lives with fianc?, homeless.TOC consulted ?  ? ? ? ?  ?  ? ?DVT prophylaxis:Lovenox ? ? ?  Code Status: Full Code ? ?Family Communication: Fiance at bedside ? ?Patient status:Inpatient ? ?Patient is from :[facility] ? ?Anticipated discharge to:SNF or back to hotel ? ?Estimated DC date: Not sure ? ? ?Consultants: None ? ?Procedures: None ? ?Antimicrobials:  ?  Anti-infectives (From admission, onward)  ? ? None  ? ?  ? ? ?Subjective: ?Patient seen and examined at the bedside this morning.  Lying in bed.  Finds at  bedside.  She was not in any kind of distress.  No nausea or vomiting during my evaluation.  Denies abdominal pain.  She remains alert and oriented. ? ?Objective: ?Vitals:  ? 03/29/22 2027 03/30/22 0034 03/30/22 [id number] 03/30/22 [id number]  ?BP:  (!) 135/92 129/84 129/87  ?Pulse:  (!) 109 (!) 103 (!) 102  ?Resp:  18 16 18   ?Temp:  98.5 ?F (36.9 ?C) 97.8 ?F (36.6 ?C) 97.9 ?F (36.6 ?C)  ?TempSrc:  Oral Oral   ?SpO2: 100% 100% 100% 100%  ?Weight:      ? ? ?Intake/Output Summary (Last 24 hours) at 03/30/2022 0943 ?Last data filed at 03/30/2022 [phone] ?Gross per 24 hour  ?Intake 1112.73 ml  ?Output 500 ml  ?Net 612.73 ml  ? ?Filed Weights  ? 03/28/22 0649  ?Weight: 115 kg  ? ? ?Examination: ? ?General exam: Overall comfortable, not in distress, morbidly obese ?HEENT: PERRL ?Respiratory system:  no wheezes or crackles  ?Cardiovascular system: S1 & S2 heard, RRR.  ?Gastrointestinal system: Abdomen is nondistended, soft and nontender. ?Central nervous system: Alert and oriented ?Extremities: No edema, no clubbing ,no cyanosis ?Skin: No rashes, no ulcers,no icterus   ? ? ?Data Reviewed: I have personally reviewed following labs and imaging studies ? ?CBC: ?Recent Labs  ?Lab 03/27/22 ?2108 03/28/22 ?0904 03/29/22 ?[id number] 03/30/22 ?0423  ?WBC 5.1 4.8 5.3 5.0  ?HGB 8.0* 8.6* 7.5* 7.3*  ?HCT 23.9* 26.6* 21.8* 22.0*  ?MCV 81.0 82.1 79.6* 80.3  ?PLT 211 221 209 245  ? ?Basic Metabolic Panel: ?Recent Labs  ?Lab 03/27/22 ?2108 03/28/22 ?0904 03/29/22 ?[id number] 03/29/22 ?2014 03/30/22 ?0423  ?NA 135 134* 136 138 136  ?K 2.3* 2.7* 3.1* 3.5 4.3  ?CL 91* 91* 97* 99 100  ?CO2 31 32 28 26 28   ?GLUCOSE 85 78 86 93 87  ?BUN 9 9 8 8 7   ?CREATININE 0.54 0.46 0.40* 0.53 0.49  ?CALCIUM 8.3* 8.1* 8.1* 8.2* 8.0*  ?MG  --  1.7 1.5* 2.5*  --   ? ? ? ?No results found for this or any previous visit (from the past 240 hour(s)).  ? ?Radiology Studies: ?No results found. ? ?Scheduled Meds: ? budesonide (PULMICORT) nebulizer solution  0.25 mg Nebulization BID  ?  enoxaparin (LOVENOX) injection  0.5 mg/kg Subcutaneous Q24H  ? metoCLOPramide (REGLAN) injection  10 mg Intravenous Q6H  ? pantoprazole (PROTONIX) IV  40 mg Intravenous Q24H  ? tiotropium  1 capsule Inhalation QHS  ? ?Continuous Infusions: ? sodium chloride 75 mL/hr at 03/30/22 0843  ? ? ? LOS: 2 days  ? ?Shelly Coss, MD ?[facility] ?P3/31/2023, 9:43 AM   ?

## 2022-03-30 NOTE — Progress Notes (Signed)
Initial Nutrition Assessment ? ?DOCUMENTATION CODES:  ? ?Obesity unspecified ? ?INTERVENTION:  ?- Encourage PO intake ?- Double protein portions ?- MVI with minerals daily ?- Request updated weight ? ?NUTRITION DIAGNOSIS:  ? ?Increased nutrient needs related to acute illness as evidenced by estimated needs. ? ?GOAL:  ? ?Patient will meet greater than or equal to 90% of their needs ? ?MONITOR:  ? ?PO intake, Labs, Weight trends ? ?REASON FOR ASSESSMENT:  ? ?Malnutrition Screening Tool ?  ? ?ASSESSMENT:  ? ?Pt admitted with hypokalemia from excessive GI loss d/t nausea and emesis. PMH significant for bipolar 1 disorder, COPD, hiatal hernia, Nissen fundoplication, frequent hospitalizations for refractory vomiting, most recently from 3/8-3/17 and with recent upper endoscopy on 2/14 that showed gastritis and nonbleeding esophageal diverticulum. ? ?PT/OT recommending SNF at discharge. ? ?No meal completions documented. ? ?Pt is a limited historian. Noted about 75% of breakfast tray eaten. Pt was requesting lunch at time of visit. Spoke with RN who states that pt is eating minimally and pocketing some of her food.  ? ?Per review of RD notes during previous admission, pt did not like and was refusing all nutrition supplements. Also noted, recommendation of permanent feeding tube access given inadequate PO intake.  ? ?Noted weight loss over the past few months. Reviewed weight history. Uncertain of more recent weight loss as current weight is an estimated weight. Will continue to monitor throughout admission. ? ?Non-pitting RUE and mild pitting BLE edema documented. ? ?Medications reviewed ? ?Labs: Potassium 4.3 (WNL), Mg 2.5 ? ?NUTRITION - FOCUSED PHYSICAL EXAM: ?Deferred to follow up as pt getting ready to be cleaned up. ? ?Diet Order:   ?Diet Order   ? ?       ?  DIET DYS 3 Room service appropriate? Yes; Fluid consistency: Thin  Diet effective now       ?  ? ?  ?  ? ?  ? ? ?EDUCATION NEEDS:  ? ?Not appropriate for  education at this time ? ?Skin:  Skin Assessment: Reviewed RN Assessment ? ?Last BM:  PTA ? ?Height:  ? ?Ht Readings from Last 1 Encounters:  ?03/07/22 5\' 9"  (1.753 m)  ? ? ?Weight:  ? ?Wt Readings from Last 1 Encounters:  ?03/28/22 115 kg  ? ? ?Ideal Body Weight:  65.9 kg ? ?BMI:  Body mass index is 37.44 kg/m?. ? ?Estimated Nutritional Needs:  ? ?Kcal:  1900-2100 ? ?Protein:  100-115g ? ?Fluid:  >/=1.9L ? ?03/30/22, RDN, LDN ?Clinical Nutrition ?

## 2022-03-31 DIAGNOSIS — D638 Anemia in other chronic diseases classified elsewhere: Secondary | ICD-10-CM | POA: Diagnosis not present

## 2022-03-31 DIAGNOSIS — E669 Obesity, unspecified: Secondary | ICD-10-CM

## 2022-03-31 DIAGNOSIS — R112 Nausea with vomiting, unspecified: Secondary | ICD-10-CM | POA: Diagnosis not present

## 2022-03-31 DIAGNOSIS — E876 Hypokalemia: Secondary | ICD-10-CM | POA: Diagnosis not present

## 2022-03-31 DIAGNOSIS — T783XXA Angioneurotic edema, initial encounter: Secondary | ICD-10-CM | POA: Diagnosis not present

## 2022-03-31 DIAGNOSIS — J449 Chronic obstructive pulmonary disease, unspecified: Secondary | ICD-10-CM

## 2022-03-31 DIAGNOSIS — D62 Acute posthemorrhagic anemia: Secondary | ICD-10-CM

## 2022-03-31 MED ORDER — DIPHENHYDRAMINE HCL 50 MG/ML IJ SOLN
12.5000 mg | Freq: Once | INTRAMUSCULAR | Status: AC
  Administered 2022-03-31: 12.5 mg via INTRAVENOUS
  Filled 2022-03-31: qty 1

## 2022-03-31 MED ORDER — METHYLPREDNISOLONE SODIUM SUCC 125 MG IJ SOLR
125.0000 mg | Freq: Once | INTRAMUSCULAR | Status: AC
  Administered 2022-03-31: 125 mg via INTRAVENOUS
  Filled 2022-03-31: qty 2

## 2022-03-31 MED ORDER — FAMOTIDINE IN NACL 20-0.9 MG/50ML-% IV SOLN
20.0000 mg | INTRAVENOUS | Status: DC
  Administered 2022-03-31 – 2022-04-04 (×4): 20 mg via INTRAVENOUS
  Filled 2022-03-31 (×5): qty 50

## 2022-03-31 MED ORDER — METHYLPREDNISOLONE SODIUM SUCC 40 MG IJ SOLR
40.0000 mg | Freq: Every day | INTRAMUSCULAR | Status: DC
  Administered 2022-04-01: 40 mg via INTRAVENOUS
  Filled 2022-03-31: qty 1

## 2022-03-31 NOTE — Evaluation (Signed)
Clinical/Bedside Swallow Evaluation ?Patient Details  ?Name: Tiffany [name] ?MRN: [id number] ?Date of Birth: Sep 24, 1961 ? ?Today's Date: 03/31/2022 ?Time: SLP Start Time (ACUTE ONLY): 1048 SLP Stop Time (ACUTE ONLY): 1110 ?SLP Time Calculation (min) (ACUTE ONLY): 22 min ? ?Past Medical History:  ?Past Medical History:  ?Diagnosis Date  ? Asthma   ? Bipolar 1 disorder (HCC)   ? COPD (chronic obstructive pulmonary disease) (HCC)   ? CVA (cerebral vascular accident) [facility])   ? Enlarged heart   ? GERD (gastroesophageal reflux disease)   ? Migraines   ? Sleep apnea   ? ?Past Surgical History:  ?Past Surgical History:  ?Procedure Laterality Date  ? ABDOMINAL HYSTERECTOMY    ? ABDOMINAL SURGERY    ? "For acid reflux"  ? CESAREAN SECTION    ? ESOPHAGOGASTRODUODENOSCOPY N/A 02/13/2022  ? Procedure: ESOPHAGOGASTRODUODENOSCOPY (EGD);  Surgeon: Toledo, Boykin Nearing, MD;  Location: [facility] ENDOSCOPY;  Service: Gastroenterology;  Laterality: N/A;  ? OTHER SURGICAL HISTORY  01/09/1998  ? acid reflux, hole in stomach they closed  ? ?HPI:  ?Tiffany [name] is a 61 y.o. female with medical history significant for Bipolar 1 disorder, COPD, hiatal hernia, history of Nissen fundoplication, frequent hospitalizations for refractory vomiting resulting in hypokalemia, most recently from 3/8 to 03/16/2022, and with recent upper endoscopy on 02/13/2022 that showed gastritis and a nonbleeding esophageal diverticulum who returns to the ED with intractable nausea and vomiting. Pt is currently NPO with ice chips. CT Head 03/27/22: No acute intracranial abnormality CXR 03/27/22: No acute intrathoracic process. CT Abdomen 03/27/22: 1. Gallbladder sludge. No evidence of cholelithiasis or acute  cholecystitis.  2. Stable mild dilation of the appendix, without inflammatory  changes to suggest appendicitis.  3. Stable hiatal hernia. Pt resting sidelying in bed and agreeable to partial HOB elevation.  ?  ?Assessment / Plan / Recommendation  ?Clinical Impression ?  Pt is known to ST services from previous bedside swallow evaluation on 03/29/2022 which recommended dysphagia 3 with thin liquids. ST services were re-consulted d/t acute upper and lower labial swelling. Pt is being treated with benadryl and steriods, cause is unknown at this time. Pt is allergic to milk and eggs products so I called the kitchen to make sure that their information was accurate to rule out possible allergic reaction to these products. All information is correct with meal tickets confirming appropriate items have been delivered. Pt's oral motor abilities are impacted by swelling as pt is not able to smile or pucker her lips, is guarded by pain, speech intelligibility recuded d/t pain with movement of lips and tongue. When performing an oral inspection, pt's mucosa appears normal (no white patches or appearance of trauma), sensation is intact bilaterally but upper lip is very painful to the touch.  Pt unable to draw liquids thru straw but was able to receive thin water via spoon. Pt's swallow response appeared very swift and was free of s/s of aspiration. At this time, ST doesn't have anything to offer in the way of treatment or diet modification as pt isn't going to eat or drink d/t pain. Education provided to pt's nurse, her significant other and attending. ST will sign off at this time. ?SLP Visit Diagnosis: Dysphagia, oral phase (R13.11) ?   ?Aspiration Risk ? Mild aspiration risk  ?  ?Diet Recommendation Dysphagia 3 (Mech soft);Thin liquid  ? ?Liquid Administration via: Spoon ?Medication Administration: Whole meds with liquid ?Supervision: Staff to assist with self feeding ?Compensations: Minimize environmental distractions;Slow rate;Small sips/bites ?Postural  Changes: Seated upright at 90 degrees  ?  ?Other  Recommendations Oral Care Recommendations: Oral care BID   ? ?Recommendations for follow up therapy are one component of a multi-disciplinary discharge planning process, led by the attending  physician.  Recommendations may be updated based on patient status, additional functional criteria and insurance authorization. ? ?Follow up Recommendations No SLP follow up  ? ? ?  ?Assistance Recommended at Discharge Frequent or constant Supervision/Assistance  ?Functional Status Assessment Patient has had a recent decline in their functional status and/or demonstrates limited ability to make significant improvements in function in a reasonable and predictable amount of time  ?Frequency and Duration   N/A ?  ?  ?   ? ?Prognosis Prognosis for Safe Diet Advancement: Fair ?Barriers to Reach Goals: Cognitive deficits;Motivation  ? ?  ? ?Swallow Study   ?General Date of Onset: 03/31/22 ?HPI: Tiffany [name] is a 61 y.o. female with medical history significant for Bipolar 1 disorder, COPD, hiatal hernia, history of Nissen fundoplication, frequent hospitalizations for refractory vomiting resulting in hypokalemia, most recently from 3/8 to 03/16/2022, and with recent upper endoscopy on 02/13/2022 that showed gastritis and a nonbleeding esophageal diverticulum who returns to the ED with intractable nausea and vomiting. Pt is currently NPO with ice chips. CT Head 03/27/22: No acute intracranial abnormality CXR 03/27/22: No acute intrathoracic process. CT Abdomen 03/27/22: 1. Gallbladder sludge. No evidence of cholelithiasis or acute  cholecystitis.  2. Stable mild dilation of the appendix, without inflammatory  changes to suggest appendicitis.  3. Stable hiatal hernia. Pt resting sidelying in bed and agreeable to partial HOB elevation. ?Type of Study: Bedside Swallow Evaluation ?Previous Swallow Assessment: BSE 03/29/2022 ?Diet Prior to this Study: Thin liquids ?Temperature Spikes Noted: No ?Respiratory Status: Room air ?History of Recent Intubation: No ?Behavior/Cognition: Alert ?Oral Cavity Assessment: Edema ?Oral Care Completed by SLP: No ?Oral Cavity - Dentition: Poor condition;Missing dentition ?Vision: Functional for  self-feeding ?Self-Feeding Abilities: Needs assist ?Patient Positioning: Upright in bed ?Baseline Vocal Quality: Low vocal intensity ?Volitional Cough: Weak ?Volitional Swallow: Unable to elicit  ?  ?Oral/Motor/Sensory Function Overall Oral Motor/Sensory Function: Severe impairment (significant labial swelling) ?Facial ROM: Within Functional Limits ?Facial Symmetry: Within Functional Limits ?Facial Strength: Within Functional Limits ?Facial Sensation: Within Functional Limits ?Lingual ROM: Reduced left ?Lingual Symmetry: Within Functional Limits ?Lingual Strength: Reduced ?Lingual Sensation: Within Functional Limits ?Velum: Within Functional Limits ?Mandible: Within Functional Limits   ?Ice Chips Ice chips: Not tested   ?Thin Liquid Thin Liquid: Within functional limits ?Presentation: Spoon  ?  ?Nectar Thick Nectar Thick Liquid: Not tested   ?Honey Thick Honey Thick Liquid: Not tested   ?Puree Puree: Not tested   ?Solid ? ? ?  Solid: Not tested  ? ?  ?Dreamer Carillo B. Dreama Saa, M.S., CCC-SLP, CBIS ?Speech-Language Pathologist ?Rehabilitation Services ?Office [phone] ? ?Bert Givans ?03/31/2022,11:57 AM ? ? ? ?

## 2022-03-31 NOTE — TOC Progression Note (Addendum)
Transition of Care (TOC) - Progression Note  ? ? ?Patient Details  ?Name: Tiffany [name] ?MRN: [id number] ?Date of Birth: Nov 02, 1961 ? ?Transition of Care (TOC) CM/SW Contact  ?Cheris Tweten E Gauri Galvao, LCSW ?Phone Number: ?03/31/2022, 9:23 AM ? ?Clinical Narrative:    ?Bed offer at [facility]).  ?Asked MD when patient will be medically ready, per MD patient should be ready Monday. ?Called brother/POA Hinton Dyer who is agreeable to [employer] when medically ready.  ?Called Randa Lynn in Admissions at [facility] who said they can take patient on Monday.  ? ?12:00- Call from [facility] needing patient's height. Informed her per chart patient is 5'9. ? ?Expected Discharge Plan: Skilled Nursing Facility ?Barriers to Discharge: Continued Medical Work up ? ?Expected Discharge Plan and Services ?Expected Discharge Plan: Skilled Nursing Facility ?  ?  ?  ?Living arrangements for the past 2 months: Hotel/Motel ?                ?  ?  ?  ?  ?  ?  ?  ?  ?  ?  ? ? ?Social Determinants of Health (SDOH) Interventions ?  ? ?Readmission Risk Interventions ? ?  03/30/2022  ?  2:25 PM 11/08/2021  ? 12:38 PM  ?Readmission Risk Prevention Plan  ?Transportation Screening Complete Complete  ?PCP or Specialist Appt within 5-7 Days  Complete  ?Home Care Screening  Complete  ?Medication Review (RN CM)  Complete  ?Medication Review [employer]) Complete   ?PCP or Specialist appointment within 3-5 days of discharge Complete   ?HRI or Home Care Consult Complete   ?SW Recovery Care/Counseling Consult Complete   ?Palliative Care Screening Not Applicable   ?Skilled Nursing Facility Complete   ? ? ?

## 2022-03-31 NOTE — Assessment & Plan Note (Addendum)
Left tongue and jaw swelling better today.  We will change Solu-Medrol over to prednisone for tomorrow.  I discontinued Reglan and Spiriva. ?

## 2022-03-31 NOTE — Assessment & Plan Note (Addendum)
Hemoglobin up to 8.3 after blood transfusion.  Hemoglobin was as low as 6.8.  Last ferritin last month was elevated at 300.   ?

## 2022-03-31 NOTE — Progress Notes (Signed)
family states pt lips are swollen and they are not normally like that. no s/s of respiratory distress. sats 100%. Top lip more swollen then bottom. Notified MD Weighting.  ?

## 2022-03-31 NOTE — Progress Notes (Signed)
?Progress Note ? ? ?Patient: Tiffany [name] HER:[id number] DOB: 04/16/1961 DOA: 03/27/2022     3 ?DOS: the patient was seen and examined on 03/31/2022 ?  ? ? ?Assessment and Plan: ?* Angioedema ?Called to the bedside with lip and tongue swelling.  Patient and boyfriend state her lips have never been this swollen before.  I spoke with the pharmacist and both Reglan and Spiriva could potentially cause angioedema.  Both medications were discontinued.  I started Solu-Medrol, Benadryl and Pepcid.  Reevaluate tomorrow. ? ?Anemia of chronic disease ?Hemoglobin drifted down to 7.3.  Last ferritin last month was elevated at 300.  Patient has not noted any blood in the urine or stool.  May end up needing a transfusion prior to disposition.  I will not give today secondary to angioedema.  Check hemoglobin tomorrow type and cross.  EGD recently showed diverticulum in the esophagus and gastritis ? ?Refractory nausea and vomiting ?On dysphagia diet if able to tolerate.  Continue Protonix.  As needed nausea medications. ? ?Hypokalemia due to excessive gastrointestinal loss of potassium ?Replaced during the hospital course. ? ?History of Nissen fundoplication ?Appeared intact on EGD in February 2023 ? ?Obesity (BMI 30-39.9) ?Last BMI 37.44 ? ?COPD (chronic obstructive pulmonary disease) (HCC) ?Not acutely exacerbated.  Got rid of nebulizers. ? ? ?Bipolar 1 disorder (HCC) ?Continue home meds pending verification ? ? ? ? ?  ? ?Subjective: Called by nursing staff to come see the patient because her lips are swollen.  Patient answered a few yes or no questions.  Last time she vomited was 2 days ago.  No abdominal pain.  She has not seen any blood in the bowel movements. ? ?Physical Exam: ?Vitals:  ? 03/31/22 0017 03/31/22 0511 03/31/22 0821 03/31/22 1204  ?BP: 134/80 134/87 134/90 118/65  ?Pulse: 99 100 92 76  ?Resp: 16 16 16 17   ?Temp: 98.2 ?F (36.8 ?C) 97.9 ?F (36.6 ?C) 98.1 ?F (36.7 ?C) 97.7 ?F (36.5 ?C)  ?TempSrc:  Oral    ?SpO2:  100% 100% 100% 100%  ?Weight:      ? ?Physical Exam ?HENT:  ?   Head: Normocephalic.  ?   Mouth/Throat:  ?   Pharynx: No oropharyngeal exudate.  ?Eyes:  ?   General: Lids are normal.  ?   Conjunctiva/sclera: Conjunctivae normal.  ?Cardiovascular:  ?   Rate and Rhythm: Normal rate and regular rhythm.  ?   Heart sounds: Normal heart sounds, S1 normal and S2 normal.  ?Pulmonary:  ?   Breath sounds: Examination of the right-lower field reveals decreased breath sounds. Examination of the left-lower field reveals decreased breath sounds. Decreased breath sounds present. No wheezing, rhonchi or rales.  ?Abdominal:  ?   Palpations: Abdomen is soft.  ?   Tenderness: There is no abdominal tenderness.  ?Musculoskeletal:  ?   Right lower leg: No swelling.  ?   Left lower leg: No swelling.  ?Skin: ?   General: Skin is warm.  ?   Findings: No rash.  ?Neurological:  ?   Mental Status: She is alert and oriented to person, place, and time.  ?  ?Data Reviewed: ?Last potassium 4.3.  Hemoglobin 7.3.  Last a.m. cortisol normal range.  Last MRI of the brain shows an empty sella.  Reviewed recent EGD report. ? ?Family Communication: Spoke with boyfriend at bedside ? ?Disposition: ?Status is: Inpatient ?Remains inpatient appropriate because: Patient has angioedema today requiring IV steroids and treatment. ?Planned Discharge Destination: Rehab likely Monday. ? ? ?  Author: ?Alford Highland, MD ?03/31/2022 2:13 PM ? ?For on call review [email or web address].  ?

## 2022-03-31 NOTE — Assessment & Plan Note (Addendum)
Last BMI 32.65 ?

## 2022-04-01 DIAGNOSIS — R112 Nausea with vomiting, unspecified: Secondary | ICD-10-CM | POA: Diagnosis not present

## 2022-04-01 DIAGNOSIS — T783XXD Angioneurotic edema, subsequent encounter: Secondary | ICD-10-CM

## 2022-04-01 DIAGNOSIS — E876 Hypokalemia: Secondary | ICD-10-CM | POA: Diagnosis not present

## 2022-04-01 DIAGNOSIS — D638 Anemia in other chronic diseases classified elsewhere: Secondary | ICD-10-CM | POA: Diagnosis not present

## 2022-04-01 LAB — CBC
HCT: 20.7 % — ABNORMAL LOW (ref 36.0–46.0)
Hemoglobin: 6.8 g/dL — ABNORMAL LOW (ref 12.0–15.0)
MCH: 26.2 pg (ref 26.0–34.0)
MCHC: 32.9 g/dL (ref 30.0–36.0)
MCV: 79.6 fL — ABNORMAL LOW (ref 80.0–100.0)
Platelets: 220 10*3/uL (ref 150–400)
RBC: 2.6 MIL/uL — ABNORMAL LOW (ref 3.87–5.11)
RDW: 17 % — ABNORMAL HIGH (ref 11.5–15.5)
WBC: 4.6 10*3/uL (ref 4.0–10.5)
nRBC: 2.2 % — ABNORMAL HIGH (ref 0.0–0.2)

## 2022-04-01 LAB — HEMOGLOBIN AND HEMATOCRIT, BLOOD
HCT: 25.8 % — ABNORMAL LOW (ref 36.0–46.0)
Hemoglobin: 8.7 g/dL — ABNORMAL LOW (ref 12.0–15.0)

## 2022-04-01 LAB — PREPARE RBC (CROSSMATCH)

## 2022-04-01 LAB — ABO/RH: ABO/RH(D): A POS

## 2022-04-01 MED ORDER — SODIUM CHLORIDE 0.9% IV SOLUTION: Freq: Once | INTRAVENOUS | Status: AC

## 2022-04-01 MED ORDER — HYDROCODONE-ACETAMINOPHEN 7.5-325 MG/15ML PO SOLN
10.0000 mL | Freq: Four times a day (QID) | ORAL | Status: DC | PRN
  Filled 2022-04-01: qty 15

## 2022-04-01 MED ORDER — METHYLPREDNISOLONE SODIUM SUCC 125 MG IJ SOLR
80.0000 mg | Freq: Every day | INTRAMUSCULAR | Status: DC
  Administered 2022-04-02: 80 mg via INTRAVENOUS
  Filled 2022-04-01: qty 2

## 2022-04-01 MED ORDER — MORPHINE SULFATE (PF) 2 MG/ML IV SOLN
2.0000 mg | INTRAVENOUS | Status: DC | PRN
  Administered 2022-04-01: 2 mg via INTRAVENOUS
  Filled 2022-04-01: qty 1

## 2022-04-01 MED ORDER — METHYLPREDNISOLONE SODIUM SUCC 40 MG IJ SOLR
40.0000 mg | Freq: Once | INTRAMUSCULAR | Status: AC
  Administered 2022-04-01: 40 mg via INTRAVENOUS
  Filled 2022-04-01: qty 1

## 2022-04-01 MED ORDER — ENOXAPARIN SODIUM 60 MG/0.6ML IJ SOSY
0.5000 mg/kg | PREFILLED_SYRINGE | INTRAMUSCULAR | Status: DC
  Administered 2022-04-01 – 2022-04-02 (×2): 50 mg via SUBCUTANEOUS
  Filled 2022-04-01 (×2): qty 0.6

## 2022-04-01 NOTE — Progress Notes (Signed)
?  Progress Note ? ? ?Patient: Tiffany [name] DPO:[id number] DOB: August 03, 1961 DOA: 03/27/2022     4 ?DOS: the patient was seen and examined on 04/01/2022 ?  ? ? ?Assessment and Plan: ?* Angioedema ?Called to the bedside with jaw pain and swelling.  Yesterday had lip and tongue swelling.  Reglan and Spiriva could potentially cause angioedema.  Both medications were discontinued.  Continue Solu-Medrol, Benadryl and Pepcid.  ? ?Anemia of chronic disease ?Hemoglobin drifted down to 6.8.  Last ferritin last month was elevated at 300.  Benefits and risks of transfusion explained to patient and family at bedside.  Patient agreeable to transfusion.  Check hemoglobin tomorrow morning. ? ?Refractory nausea and vomiting ?On dysphagia diet if able to tolerate.  Continue Protonix.  As needed nausea medications.  Reviewed prior endoscopy showing esophageal diverticulum which could be the cause of this ? ?Hypokalemia due to excessive gastrointestinal loss of potassium ?Replaced during the hospital course. ? ?History of Nissen fundoplication ?Appeared intact on EGD in February 2023 ? ?Obesity (BMI 30-39.9) ?Last BMI 32.65 ? ?COPD (chronic obstructive pulmonary disease) (HCC) ?Not acutely exacerbated.  Got rid of nebulizers. ? ? ?Bipolar 1 disorder (HCC) ?Continue home meds pending verification ? ? ? ? ?  ? ?Subjective: Called to see patient this morning with jaw pain and swelling.  Patient's lip swelling is better today.  Tongue is still swollen.  Initially admitted with nausea vomiting ? ?Physical Exam: ?Vitals:  ? 03/31/22 2305 04/01/22 4315 04/01/22 0342 04/01/22 0813  ?BP: 118/70 (!) 108/48  127/70  ?Pulse: 88 85  87  ?Resp: 17 17  18   ?Temp: 98 ?F (36.7 ?C) 98.1 ?F (36.7 ?C)  98.7 ?F (37.1 ?C)  ?TempSrc: Oral Oral    ?SpO2: 100% 100%  100%  ?Weight:   100.3 kg   ? ?Physical Exam ?HENT:  ?   Head: Normocephalic.  ?   Mouth/Throat:  ?   Pharynx: No oropharyngeal exudate.  ?   Comments: Lip swelling a little bit less than yesterday.   Tongue swelling a little bit less than yesterday.  Some swelling around the jaw bilaterally with some pain ?Eyes:  ?   General: Lids are normal.  ?   Conjunctiva/sclera: Conjunctivae normal.  ?Cardiovascular:  ?   Rate and Rhythm: Normal rate and regular rhythm.  ?   Heart sounds: Normal heart sounds, S1 normal and S2 normal.  ?Pulmonary:  ?   Breath sounds: No decreased breath sounds, wheezing, rhonchi or rales.  ?Abdominal:  ?   Palpations: Abdomen is soft.  ?   Tenderness: There is no abdominal tenderness.  ?Musculoskeletal:  ?   Right lower leg: No swelling.  ?   Left lower leg: No swelling.  ?Skin: ?   General: Skin is warm.  ?   Findings: No rash.  ?Neurological:  ?   Mental Status: She is alert.  ?  ?Data Reviewed: ?Hemoglobin 6.8 ? ?Family Communication: Family at bedside ? ?Disposition: ?Status is: Inpatient ?Remains inpatient appropriate because: Having jaw pain discharged today.  Still having some lip swelling but less than yesterday.  Transfusing 1 unit of packed red blood cells for low hemoglobin ? ?Planned Discharge Destination: Rehab ? ? ?Author: ? , MD ?04/01/2022 2:38 PM ? ?For on call review [email or web address].  ?

## 2022-04-02 LAB — CBC
HCT: 24.4 % — ABNORMAL LOW (ref 36.0–46.0)
Hemoglobin: 8.3 g/dL — ABNORMAL LOW (ref 12.0–15.0)
MCH: 27.4 pg (ref 26.0–34.0)
MCHC: 34 g/dL (ref 30.0–36.0)
MCV: 80.5 fL (ref 80.0–100.0)
Platelets: 197 10*3/uL (ref 150–400)
RBC: 3.03 MIL/uL — ABNORMAL LOW (ref 3.87–5.11)
RDW: 16.5 % — ABNORMAL HIGH (ref 11.5–15.5)
WBC: 6 10*3/uL (ref 4.0–10.5)
nRBC: 1.8 % — ABNORMAL HIGH (ref 0.0–0.2)

## 2022-04-02 LAB — BASIC METABOLIC PANEL
Anion gap: 9 (ref 5–15)
BUN: 12 mg/dL (ref 6–20)
CO2: 29 mmol/L (ref 22–32)
Calcium: 8.6 mg/dL — ABNORMAL LOW (ref 8.9–10.3)
Chloride: 97 mmol/L — ABNORMAL LOW (ref 98–111)
Creatinine, Ser: 0.5 mg/dL (ref 0.44–1.00)
GFR, Estimated: >60 mL/min (ref >60)
Glucose, Bld: 104 mg/dL — ABNORMAL HIGH (ref 70–99)
Potassium: 2.8 mmol/L — ABNORMAL LOW (ref 3.5–5.1)
Sodium: 135 mmol/L (ref 135–145)

## 2022-04-02 LAB — MAGNESIUM: Magnesium: 1.7 mg/dL (ref 1.7–2.4)

## 2022-04-02 LAB — PHOSPHORUS: Phosphorus: 3.1 mg/dL (ref 2.5–4.6)

## 2022-04-02 MED ORDER — POTASSIUM CHLORIDE CRYS ER 20 MEQ PO TBCR
40.0000 meq | EXTENDED_RELEASE_TABLET | Freq: Two times a day (BID) | ORAL | Status: DC
  Administered 2022-04-02 (×2): 40 meq via ORAL
  Filled 2022-04-02 (×2): qty 2

## 2022-04-02 MED ORDER — MAGNESIUM SULFATE 2 GM/50ML IV SOLN
2.0000 g | Freq: Once | INTRAVENOUS | Status: AC
  Administered 2022-04-02: 2 g via INTRAVENOUS
  Filled 2022-04-02: qty 50

## 2022-04-02 MED ORDER — POTASSIUM CHLORIDE 10 MEQ/100ML IV SOLN
10.0000 meq | INTRAVENOUS | Status: DC
  Filled 2022-04-02 (×2): qty 100

## 2022-04-02 MED ORDER — POTASSIUM CHLORIDE 10 MEQ/100ML IV SOLN
10.0000 meq | INTRAVENOUS | Status: AC
  Administered 2022-04-02 (×2): 10 meq via INTRAVENOUS

## 2022-04-02 MED ORDER — PREDNISONE 20 MG PO TABS
40.0000 mg | ORAL_TABLET | Freq: Every day | ORAL | Status: DC
  Administered 2022-04-03 – 2022-04-04 (×2): 40 mg via ORAL
  Filled 2022-04-02 (×2): qty 2

## 2022-04-02 NOTE — Progress Notes (Signed)
Physical Therapy Treatment ?Patient Details ?Name: Tiffany [name] ?MRN: [id number] ?DOB: [birth year] ?Today's Date: 04/02/2022 ? ? ?History of Present Illness Pt is a 61 y.o. female presenting to hospital 3/28 d/t vomiting a lot, losing weight, and not speaking as much as usual.  Pt admitted with hypokalemia d/t excessive gastrointestinal loss of potassium, and refractory N/V.  PMH includes h/o hiatal hernia, Nissen fundoplication, grade D esophagitis, bipolar 1 disorder, COPD, enlarged heart, abdominal sx, stroke in November 2022. ? ?  ?PT Comments  ? ? Pt resting in bed upon PT arrival; no gown in place but blankets over her LE's (nurse reports pt had taken off her gown earlier).  Pt agreeable to putting gown back on and pt able to put UE's through sleeves to donn gown.  Max assist for logrolling towards R in bed and to sit up on edge of bed (requiring 2 trials to sit up d/t pt initially reporting discomfort in shoulders attempting to push up with UE's to assist so therapist utilized bed sheet to improve comfort sitting up on edge of bed).  Able to tolerate sitting edge of bed approximately 2 minutes with max assist d/t posterior lean before pt requesting to lay down d/t fatigue.  2 assist required to boost pt up in bed.  Will continue to focus on strengthening, sitting balance, and progressive functional mobility per pt tolerance. ?  ?Recommendations for follow up therapy are one component of a multi-disciplinary discharge planning process, led by the attending physician.  Recommendations may be updated based on patient status, additional functional criteria and insurance authorization. ? ?Follow Up Recommendations ? Skilled nursing-short term rehab (<3 hours/day) (trial) ?  ?  ?Assistance Recommended at Discharge Frequent or constant Supervision/Assistance  ?Patient can return home with the following Two people to help with walking and/or transfers;Two people to help with bathing/dressing/bathroom;Assistance with  cooking/housework;Direct supervision/assist for medications management;Assist for transportation;Help with stairs or ramp for entrance ?  ?Equipment Recommendations ? Hospital bed;Other (comment) (hoyer lift)  ?  ?Recommendations for Other Services   ? ? ?  ?Precautions / Restrictions Precautions ?Precautions: Fall ?Precaution Comments: Aspiration ?Restrictions ?Weight Bearing Restrictions: No  ?  ? ?Mobility ? Bed Mobility ?Overal bed mobility: Needs Assistance ?Bed Mobility: Rolling ?Rolling: Max assist (logrolling towards R side) ?  ?Supine to sit: Max assist ?Sit to supine: Max assist ?  ?General bed mobility comments: assist for trunk and B LE's; vc's for technique ?  ? ?Transfers ?  ?  ?  ?  ?  ?  ?  ?  ?  ?General transfer comment: not appropriate at this time ?  ? ?Ambulation/Gait ?  ?  ?  ?  ?  ?  ?  ?General Gait Details: not appropriate at this time (pt recently non-ambulatory) ? ? ?Stairs ?  ?  ?  ?  ?  ? ? ?Wheelchair Mobility ?  ? ?Modified Rankin (Stroke Patients Only) ?  ? ? ?  ?Balance Overall balance assessment: Needs assistance ?Sitting-balance support: Bilateral upper extremity supported, Feet supported ?Sitting balance-Leahy Scale: Poor ?Sitting balance - Comments: requiring max assist for sitting balance d/t posterior lean ?Postural control: Posterior lean ?  ?  ?  ?  ?  ?  ?  ?  ?  ?  ?  ?  ?  ?  ?  ? ?  ?Cognition Arousal/Alertness: Awake/alert ?Behavior During Therapy: Flat affect ?  ?  ?  ?  ?  ?  ?  ?  ?  ?  ?  ?  ?  ?  ?  ?  ?  ?  General Comments: Oriented to at least person (pt did not answer rest of questions) ?  ?  ? ?  ?Exercises   ? ?  ?General Comments  Nursing cleared pt for participation in physical therapy.  Pt agreeable to PT session. ? ?  ?  ? ?Pertinent Vitals/Pain Pain Assessment ?Pain Assessment: Faces ?Faces Pain Scale: No hurt (4/10 with activity) ?Pain Location: B shoulder discomfort ?Pain Descriptors / Indicators: Discomfort ?Pain Intervention(s): Limited activity within  patient's tolerance, Monitored during session, Repositioned ?HR 83-102 bpm during sessions activities.  ? ? ?Home Living   ?  ?  ?  ?  ?  ?  ?  ?  ?  ?   ?  ?Prior Function    ?  ?  ?   ? ?PT Goals (current goals can now be found in the care plan section) Acute Rehab PT Goals ?Patient Stated Goal: to be able to transfer to w/c ?PT Goal Formulation: With patient ?Time For Goal Achievement: 04/12/22 ?Potential to Achieve Goals: Fair ?Progress towards PT goals: Progressing toward goals ? ?  ?Frequency ? ? ? Min 2X/week ? ? ? ?  ?PT Plan Current plan remains appropriate  ? ? ?Co-evaluation   ?  ?  ?  ?  ? ?  ?AM-PAC PT "6 Clicks" Mobility   ?Outcome Measure ? Help needed turning from your back to your side while in a flat bed without using bedrails?: A Lot ?Help needed moving from lying on your back to sitting on the side of a flat bed without using bedrails?: A Lot ?Help needed moving to and from a bed to a chair (including a wheelchair)?: Total ?Help needed standing up from a chair using your arms (e.g., wheelchair or bedside chair)?: Total ?Help needed to walk in hospital room?: Total ?Help needed climbing 3-5 steps with a railing? : Total ?6 Click Score: 8 ? ?  ?End of Session   ?Activity Tolerance: Patient limited by fatigue ?Patient left: in bed;with call bell/phone within reach;with bed alarm set;Other (comment) (B heels floating via pillow support) ?Nurse Communication: Mobility status;Precautions ?PT Visit Diagnosis: Muscle weakness (generalized) (M62.81);Other abnormalities of gait and mobility (R26.89) ?  ? ? ?Time: [phone] ?PT Time Calculation (min) (ACUTE ONLY): 16 min ? ?Charges:  $Therapeutic Activity: 8-22 mins          ?          ?Hendricks Limes, PT ?04/02/22, 9:41 AM ? ? ?

## 2022-04-02 NOTE — Progress Notes (Signed)
Last ?Progress Note ? ? ?Patient: Tiffany [name] BCW:[id number] DOB: 1961-01-27 DOA: 03/27/2022     5 ?DOS: the patient was seen and examined on 04/02/2022 ?  ? ? ?Assessment and Plan: ?* Angioedema ?Left tongue and jaw swelling better today.  We will change Solu-Medrol over to prednisone for tomorrow.  I discontinued Reglan and Spiriva. ? ?Hypokalemia due to excessive gastrointestinal loss of potassium ?Potassium 2.8 today.  Replace IV and oral potassium today and recheck tomorrow. ? ?Anemia of chronic disease ?Hemoglobin up to 8.3 after blood transfusion.  Hemoglobin was as low as 6.8.  Last ferritin last month was elevated at 300.   ? ?Hypomagnesemia ?Replace IV magnesium today and check tomorrow. ? ?Refractory nausea and vomiting ?On dysphagia diet if able to tolerate.  Continue Protonix.  As needed nausea medications.  Reviewed prior endoscopy showing esophageal diverticulum which could be the cause of this ? ?History of Nissen fundoplication ?Appeared intact on EGD in February 2023 ? ?Obesity (BMI 30-39.9) ?Last BMI 32.65 ? ?COPD (chronic obstructive pulmonary disease) (HCC) ?Not acutely exacerbated.  Got rid of nebulizers. ? ? ?Bipolar 1 disorder (HCC) ?Continue home meds pending verification ? ? ? ? ?  ? ?Subjective: Patient's major complaint that was her IV infiltrated and wanted out of her arm.  She was able to eat some breakfast with the nursing staff.  The last few days, she has not been able to eat secondary to lip and tongue and jaw swelling.  Initially came in with nausea vomiting. ? ?Physical Exam: ?Vitals:  ? 04/02/22 0028 04/02/22 0524 04/02/22 0818 04/02/22 1158  ?BP: 122/69 125/85 115/75 131/90  ?Pulse: (!) 102 97 98 89  ?Resp: 18 17 16 18   ?Temp: 98.9 ?F (37.2 ?C) 98.6 ?F (37 ?C) 97.9 ?F (36.6 ?C) 98.2 ?F (36.8 ?C)  ?TempSrc:   Oral   ?SpO2: 98% 99% 99% 99%  ?Weight:      ? ?Physical Exam ?HENT:  ?   Head: Normocephalic.  ?   Mouth/Throat:  ?   Pharynx: No oropharyngeal exudate.  ?   Comments: Lip  and tongue swelling less today. ?Eyes:  ?   General: Lids are normal.  ?   Conjunctiva/sclera: Conjunctivae normal.  ?Cardiovascular:  ?   Rate and Rhythm: Normal rate and regular rhythm.  ?   Heart sounds: Normal heart sounds, S1 normal and S2 normal.  ?Pulmonary:  ?   Breath sounds: No decreased breath sounds, wheezing, rhonchi or rales.  ?Abdominal:  ?   Palpations: Abdomen is soft.  ?   Tenderness: There is no abdominal tenderness.  ?Musculoskeletal:  ?   Right lower leg: No swelling.  ?   Left lower leg: No swelling.  ?Skin: ?   General: Skin is warm.  ?   Findings: No rash.  ?Neurological:  ?   Mental Status: She is alert.  ?  ?Data Reviewed: ?Potassium 2.8, hemoglobin 8.3 ? ?Family Communication: Updated brother on the phone ? ?Disposition: ?Status is: Inpatient ?Remains inpatient appropriate because: Replacing potassium IV and orally today, replacing IV magnesium.  If electrolytes and hemoglobin stable tomorrow may be able to discharge. ? ?Planned Discharge Destination: Rehab ? ?Author: ? , MD ?04/02/2022 3:53 PM ? ?For on call review [email or web address].  ?

## 2022-04-02 NOTE — Assessment & Plan Note (Signed)
Replace IV magnesium today and check tomorrow. ?

## 2022-04-03 ENCOUNTER — Other Ambulatory Visit: Payer: Self-pay

## 2022-04-03 DIAGNOSIS — F319 Bipolar disorder, unspecified: Secondary | ICD-10-CM

## 2022-04-03 LAB — HEMOGLOBIN: Hemoglobin: 7.6 g/dL — ABNORMAL LOW (ref 12.0–15.0)

## 2022-04-03 LAB — MAGNESIUM: Magnesium: 2 mg/dL (ref 1.7–2.4)

## 2022-04-03 LAB — POTASSIUM: Potassium: 3.5 mmol/L (ref 3.5–5.1)

## 2022-04-03 LAB — PREPARE RBC (CROSSMATCH)

## 2022-04-03 MED ORDER — PANTOPRAZOLE SODIUM 40 MG PO TBEC: 40.0000 mg | DELAYED_RELEASE_TABLET | Freq: Every day | ORAL | 0 refills | Status: AC

## 2022-04-03 MED ORDER — FERROUS SULFATE 300 (60 FE) MG/5ML PO SYRP: 300.0000 mg | ORAL_SOLUTION | Freq: Every day | ORAL | 0 refills | Status: AC

## 2022-04-03 MED ORDER — POTASSIUM CHLORIDE CRYS ER 20 MEQ PO TBCR
20.0000 meq | EXTENDED_RELEASE_TABLET | Freq: Every day | ORAL | Status: DC
  Administered 2022-04-03 – 2022-04-04 (×2): 20 meq via ORAL
  Filled 2022-04-03 (×2): qty 1

## 2022-04-03 MED ORDER — MAGNESIUM OXIDE 400 MG PO CAPS: 400.0000 mg | ORAL_CAPSULE | Freq: Every day | ORAL | 0 refills | Status: AC

## 2022-04-03 MED ORDER — DICLOFENAC SODIUM 1 % EX GEL: 4.0000 g | Freq: Four times a day (QID) | CUTANEOUS | 0 refills | Status: AC

## 2022-04-03 MED ORDER — ACETAMINOPHEN 325 MG PO TABS: 650.0000 mg | ORAL_TABLET | Freq: Four times a day (QID) | ORAL | Status: AC | PRN

## 2022-04-03 MED ORDER — PREDNISONE 10 MG PO TABS: ORAL_TABLET | ORAL | 0 refills | Status: AC

## 2022-04-03 MED ORDER — ONDANSETRON HCL 4 MG PO TABS: 4.0000 mg | ORAL_TABLET | Freq: Four times a day (QID) | ORAL | 0 refills | Status: DC | PRN

## 2022-04-03 MED ORDER — FUROSEMIDE 10 MG/ML IJ SOLN
20.0000 mg | Freq: Once | INTRAMUSCULAR | Status: AC
  Administered 2022-04-03: 20 mg via INTRAVENOUS
  Filled 2022-04-03: qty 2

## 2022-04-03 MED ORDER — SODIUM CHLORIDE 0.9% IV SOLUTION: Freq: Once | INTRAVENOUS | Status: AC

## 2022-04-03 MED ORDER — ACETAMINOPHEN 325 MG PO TABS
650.0000 mg | ORAL_TABLET | Freq: Once | ORAL | Status: AC
  Administered 2022-04-03: 650 mg via ORAL
  Filled 2022-04-03: qty 2

## 2022-04-03 MED ORDER — POTASSIUM CHLORIDE CRYS ER 20 MEQ PO TBCR: 20.0000 meq | EXTENDED_RELEASE_TABLET | Freq: Every day | ORAL | 0 refills | Status: AC

## 2022-04-03 NOTE — Plan of Care (Signed)

## 2022-04-03 NOTE — TOC Progression Note (Signed)
Transition of Care (TOC) - Progression Note  ? ? ?Patient Details  ?Name: Tiffany [name] ?MRN: [id number] ?Date of Birth: 03-13-1961 ? ?Transition of Care (TOC) CM/SW Contact  ?Trevel Dillenbeck A Delesa Kawa, LCSW ?Phone Number: ?04/03/2022, 2:18 PM ? ?Clinical Narrative:  Pt accepted at [facility]. SNF to start the Medicaid auth.  ? ? ? ?Expected Discharge Plan: [place] ?Barriers to Discharge: Continued Medical Work up ? ?Expected Discharge Plan and Services ?Expected Discharge Plan: [place] ?  ?  ?  ?Living arrangements for the past 2 months: Hotel/Motel ?Expected Discharge Date: 04/03/22               ?  ?  ?  ?  ?  ?  ?  ?  ?  ?  ? ? ?Social Determinants of Health (SDOH) Interventions ?  ? ?Readmission Risk Interventions ? ?  03/30/2022  ?  2:25 PM 11/08/2021  ? 12:38 PM  ?Readmission Risk Prevention Plan  ?Transportation Screening Complete Complete  ?PCP or Specialist Appt within 5-7 Days  Complete  ?Home Care Screening  Complete  ?Medication Review (RN CM)  Complete  ?Medication Review Press photographer) Complete   ?PCP or Specialist appointment within 3-5 days of discharge Complete   ?[place] or Home Care Consult Complete   ?SW Recovery Care/Counseling Consult Complete   ?Palliative Care Screening Not Applicable   ?Skilled Nursing Facility Complete   ? ? ?

## 2022-04-03 NOTE — Discharge Summary (Signed)
?Physician Discharge Summary ?  ?Patient: Tiffany [name] MRN: [id number] DOB: December 12, 1961  ?Admit date:     03/27/2022  ?Discharge date: 04/03/22  ?Discharge Physician: Alford Highlandichard Maycie Luera  ? ?PCP: Eddie DibblesAllison, James H, MD  ? ?Recommendations at discharge:  ? ?Follow-up team at rehab 1 day ? ?Discharge Diagnoses: ?Principal Problem: ?  Angioedema ?Active Problems: ?  Hypokalemia due to excessive gastrointestinal loss of potassium ?  Anemia of chronic disease ?  Nausea and vomiting ?  Hypomagnesemia ?  Hypokalemia ?  Bipolar 1 disorder (HCC) ?  COPD (chronic obstructive pulmonary disease) (HCC) ?  Obesity (BMI 30-39.9) ?  History of Nissen fundoplication ? ? ?Hospital Course: ?The patient was admitted to the hospital on 03/28/2022 and discharged on 04/03/2022.  Initially came in with nausea and vomiting.  She was then given as needed nausea medications and this has improved.  During the hospital course she developed angioedema with swelling of the tongue jaw and lips.  I believe this was likely secondary to the Reglan or Spiriva.  Both of these medications were discontinued.  As needed Zofran for nausea vomiting.  During the hospital course we were also battling electrolyte abnormalities and magnesium and potassium were supplemented during the hospital course.  The patient also has anemia of chronic disease on her labs.  The patient was given 2 units of packed red blood cells during the hospital course.  She has been anemic going on for a while.  On a previous hospitalization that she did have an endoscopy that showed an esophageal diverticulum and gastritis.  Patient was transfused on a hemoglobin of 7.6 upon discharge. ? ?Recommend checking a BMP with magnesium and CBC in 1 week ? ?Assessment and Plan: ?* Angioedema ?Lip, tongue and jaw swelling better today.  We will change Solu-Medrol over to prednisone taper upon disposition.  I discontinued Reglan and Spiriva. ? ?Hypokalemia due to excessive gastrointestinal loss of  potassium ?Potassium 3.5 today.  Continue oral supplementation.  We will also supplement magnesium. ? ?Anemia of chronic disease ?Ferritin elevated from previous hospitalization.  Hemoglobin today 7.6 we will give 1 unit of packed red blood cells today before discharge after rehab.  Recommend checking a CBC next week.  Patient on Protonix for previous gastritis on endoscopy.  Will prescribe ferrous sulfate also. ? ?Hypomagnesemia ?Magnesium 2.0 upon discharge.  Will supplement mag oxide on discharge. ? ?Nausea and vomiting ?On dysphagia 3 diet with thin liquids..  As needed Zofran.  Improve from when she came in ? ?History of Nissen fundoplication ?Appeared intact on EGD in February 2023 ? ?Obesity (BMI 30-39.9) ?Last BMI 32.78 ? ?COPD (chronic obstructive pulmonary disease) (HCC) ?Not acutely exacerbated.  Got rid of nebulizers. ? ? ?Bipolar 1 disorder (HCC) ?Not on any medications for this ? ? ? ? ?  ? ? ?Consultants: None ?Procedures performed: None ?Disposition: Rehabilitation facility ?Diet recommendation:  ?Dysphagia 3 diet with thin liquids ?DISCHARGE MEDICATION: ?Allergies as of 04/03/2022   ? ?   Reactions  ? Dairy Aid [tilactase] Other (See Comments)  ? Exacerbates asthma  ? Egg [eggs Or Egg-derived Products] Other (See Comments)  ? Exacerbated asthma  ? Metoclopramide Swelling  ? Angioedema with tongue swelling  ? Milk-related Compounds   ? ?  ? ?  ?Medication List  ?  ? ?STOP taking these medications   ? ?Spiriva HandiHaler 18 MCG inhalation capsule ?Generic drug: tiotropium ?  ? ?  ? ?TAKE these medications   ? ?acetaminophen 325 MG  tablet ?Commonly known as: TYLENOL ?Take 2 tablets (650 mg total) by mouth every 6 (six) hours as needed for mild pain (or Fever >/= 101). ?  ?diclofenac Sodium 1 % Gel ?Commonly known as: VOLTAREN ?Apply 4 g topically 4 (four) times daily. To areas of pain ?What changed: additional instructions ?  ?diphenhydrAMINE 12.5 MG/5ML elixir ?Commonly known as: BENADRYL ?Take 10 mLs  (25 mg total) by mouth 4 (four) times daily as needed. ?  ?feeding supplement (KATE FARMS STANDARD 1.4) Liqd liquid ?Take 325 mLs by mouth 3 (three) times daily between meals. ?  ?ferrous sulfate 300 (60 Fe) MG/5ML syrup ?Take 5 mLs (300 mg total) by mouth daily. ?  ?Flovent HFA 110 MCG/ACT inhaler ?Generic drug: fluticasone ?Inhale 2 puffs into the lungs 2 (two) times daily. ?  ?Magnesium Oxide 400 MG Caps ?Take 1 capsule (400 mg total) by mouth daily. ?  ?multivitamin with minerals Tabs tablet ?Take 1 tablet by mouth daily. ?  ?ondansetron 4 MG disintegrating tablet ?Commonly known as: ZOFRAN-ODT ?Take 1 tablet (4 mg total) by mouth every 8 (eight) hours as needed for nausea or vomiting. ?  ?pantoprazole 40 MG tablet ?Commonly known as: PROTONIX ?Take 1 tablet (40 mg total) by mouth daily. ?Start taking on: April 04, 2022 ?  ?polyethylene glycol 17 g packet ?Commonly known as: MIRALAX / GLYCOLAX ?Take 17 g by mouth 2 (two) times daily as needed. ?  ?potassium chloride SA 20 MEQ tablet ?Commonly known as: KLOR-CON M ?Take 1 tablet (20 mEq total) by mouth daily. ?Start taking on: April 04, 2022 ?  ?predniSONE 10 MG tablet ?Commonly known as: DELTASONE ?3 tabs po day1; 2 tabs po day2,3; 1 tab po day4,5; 1/2 tab po day6,7 ?  ? ?  ? ? ?Discharge Exam: ?Filed Weights  ? 03/28/22 0649 04/01/22 0342 04/03/22 0500  ?Weight: 115 kg 100.3 kg 100.7 kg  ? ?Physical Exam ?HENT:  ?   Head: Normocephalic.  ?   Mouth/Throat:  ?   Pharynx: No oropharyngeal exudate.  ?   Comments: Lip and tongue swelling less today. ?Eyes:  ?   General: Lids are normal.  ?   Conjunctiva/sclera: Conjunctivae normal.  ?Cardiovascular:  ?   Rate and Rhythm: Normal rate and regular rhythm.  ?   Heart sounds: Normal heart sounds, S1 normal and S2 normal.  ?Pulmonary:  ?   Breath sounds: No decreased breath sounds, wheezing, rhonchi or rales.  ?Abdominal:  ?   Palpations: Abdomen is soft.  ?   Tenderness: There is no abdominal tenderness.  ?Musculoskeletal:   ?   Right lower leg: No swelling.  ?   Left lower leg: No swelling.  ?Skin: ?   General: Skin is warm.  ?   Findings: No rash.  ?Neurological:  ?   Mental Status: She is alert.  ?  ? ?Condition at discharge: stable ? ?The results of significant diagnostics from this hospitalization (including imaging, microbiology, ancillary and laboratory) are listed below for reference.  ? ?Imaging Studies: ?CT Head Wo Contrast ? ?Result Date: 03/27/2022 ?CLINICAL DATA:  Altered mental status. EXAM: CT HEAD WITHOUT CONTRAST TECHNIQUE: Contiguous axial images were obtained from the base of the skull through the vertex without intravenous contrast. RADIATION DOSE REDUCTION: This exam was performed according to the departmental dose-optimization program which includes automated exposure control, adjustment of the mA and/or kV according to patient size and/or use of iterative reconstruction technique. COMPARISON:  Plain brain CT, dated November 13, 2021  and MR head dated March 08, 2022 FINDINGS: Brain: No evidence of acute infarction, hemorrhage, hydrocephalus, extra-axial collection or mass lesion/mass effect. 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 Vascular: No hyperdense vessel or unexpected calcification. Skull: Normal. Negative for fracture or focal lesion. Sinuses/Orbits: No acute finding. Other: None. IMPRESSION: No acute intracranial abnormality. Electronically Signed   By: Aram Candela M.D.   On: 03/27/2022 22:51  ? ?MR BRAIN W WO CONTRAST ? ?Result Date: 03/08/2022 ?CLINICAL DATA:  Neuro deficit, acute, stroke suspected. EXAM: MRI HEAD WITHOUT AND WITH CONTRAST TECHNIQUE: Multiplanar, multiecho pulse sequences of the brain and surrounding structures were obtained without and with intravenous contrast. CONTRAST:  54mL GADAVIST GADOBUTROL 1 MMOL/ML IV SOLN COMPARISON:  Prior head CT examinations 11/13/2021 and earlier. FINDINGS: Brain: Cerebral volume appears within normal limits for age. Redemonstrated "empty" and somewhat  expanded sella turcica. No significant cerebral white matter disease for age. No cortical encephalomalacia is identified. There is no acute infarct. No evidence of an intracranial mass. No chronic intracrani

## 2022-04-04 LAB — CBC
HCT: 29.6 % — ABNORMAL LOW (ref 36.0–46.0)
Hemoglobin: 10.2 g/dL — ABNORMAL LOW (ref 12.0–15.0)
MCH: 27.7 pg (ref 26.0–34.0)
MCHC: 34.5 g/dL (ref 30.0–36.0)
MCV: 80.4 fL (ref 80.0–100.0)
Platelets: 206 10*3/uL (ref 150–400)
RBC: 3.68 MIL/uL — ABNORMAL LOW (ref 3.87–5.11)
RDW: 16.6 % — ABNORMAL HIGH (ref 11.5–15.5)
WBC: 5.7 10*3/uL (ref 4.0–10.5)
nRBC: 1.2 % — ABNORMAL HIGH (ref 0.0–0.2)

## 2022-04-04 LAB — TYPE AND SCREEN
ABO/RH(D): A POS
Antibody Screen: NEGATIVE
Unit division: 0
Unit division: 0

## 2022-04-04 LAB — BASIC METABOLIC PANEL
Anion gap: 12 (ref 5–15)
BUN: 12 mg/dL (ref 6–20)
CO2: 28 mmol/L (ref 22–32)
Calcium: 8.8 mg/dL — ABNORMAL LOW (ref 8.9–10.3)
Chloride: 96 mmol/L — ABNORMAL LOW (ref 98–111)
Creatinine, Ser: 0.53 mg/dL (ref 0.44–1.00)
GFR, Estimated: >60 mL/min (ref >60)
Glucose, Bld: 83 mg/dL (ref 70–99)
Potassium: 3.4 mmol/L — ABNORMAL LOW (ref 3.5–5.1)
Sodium: 136 mmol/L (ref 135–145)

## 2022-04-04 LAB — BPAM RBC
Blood Product Expiration Date: 202304112359
Blood Product Expiration Date: 202305052359
ISSUE DATE / TIME: 202304021448
ISSUE DATE / TIME: 202304040836
Unit Type and Rh: 600
Unit Type and Rh: 6200

## 2022-04-04 LAB — MAGNESIUM: Magnesium: 1.9 mg/dL (ref 1.7–2.4)

## 2022-04-04 NOTE — Progress Notes (Signed)
Pt being discharged, report called to Paulina at facility, pt with no complaints, awaiting EMS for transport, brother POA aware ?

## 2022-04-04 NOTE — Discharge Summary (Signed)
Admit date: 03/27/22 ?D/c date 04/04/22 ? ?Full d/c summary done by Dr. Renae Gloss on 04/03/22 and it is up to date  ?

## 2022-04-04 NOTE — TOC Transition Note (Signed)
Transition of Care (TOC) - CM/SW Discharge Note ? ? ?Patient Details  ?Name: Tiffany [name] ?MRN: [id number] ?Date of Birth: Aug 06, 1961 ? ?Transition of Care (TOC) CM/SW Contact:  ?Jesilyn Easom A Alyan Hartline, LCSW ?Phone Number: ?04/04/2022, 11:10 AM ? ? ?Clinical Narrative:   Clinical Social Worker facilitated patient discharge including contacting patient family and facility to confirm patient discharge plans.  Clinical information faxed to facility and family agreeable with plan.  CSW arranged ambulance transport via [facility] room [facility] .  RN to call for report prior to discharge. ? ? ? ? ? ?Final next level of care: Skilled Nursing Facility ?Barriers to Discharge: No Barriers Identified ? ? ?Patient Goals and CMS Choice ?Patient states their goals for this hospitalization and ongoing recovery are:: SNF ?CMS Medicare.gov Compare Post Acute Care list provided to:: Patient Represenative (must comment) ?Choice offered to / list presented to : Sibling, [facility] POA / Guardian ? ?Discharge Placement ?  ?           ?Patient chooses bed at:  ([place]/ [place]) ?Patient to be transferred to facility by: [facility] ?Name of family member notified: brother ?Patient and family notified of of transfer: 04/04/22 ? ?Discharge Plan and Services ?  ?  ?           ?  ?  ?  ?  ?  ?  ?  ?  ?  ?  ? ?Social Determinants of Health (SDOH) Interventions ?  ? ? ?Readmission Risk Interventions ? ?  03/30/2022  ?  2:25 PM 11/08/2021  ? 12:38 PM  ?Readmission Risk Prevention Plan  ?Transportation Screening Complete Complete  ?PCP or Specialist Appt within 5-7 Days  Complete  ?Home Care Screening  Complete  ?Medication Review (RN CM)  Complete  ?Medication Review Oceanographer) Complete   ?PCP or Specialist appointment within 3-5 days of discharge Complete   ?[place] or Home Care Consult Complete   ?SW Recovery Care/Counseling Consult Complete   ?Palliative Care Screening Not Applicable   ?Skilled Nursing Facility Complete    ? ? ? ? ? ?

## 2022-04-10 ENCOUNTER — Other Ambulatory Visit: Payer: Self-pay

## 2022-04-10 ENCOUNTER — Emergency Department (HOSPITAL_COMMUNITY): Payer: Medicaid Other

## 2022-04-10 ENCOUNTER — Emergency Department (HOSPITAL_COMMUNITY)
Admission: EM | Admit: 2022-04-10 | Discharge: 2022-04-10 | Disposition: A | Discharge status: Home / Self Care | Payer: Medicaid Other | Attending: Emergency Medicine | Admitting: Emergency Medicine

## 2022-04-10 ENCOUNTER — Encounter (HOSPITAL_COMMUNITY): Payer: Self-pay

## 2022-04-10 DIAGNOSIS — J449 Chronic obstructive pulmonary disease, unspecified: Secondary | ICD-10-CM | POA: Diagnosis not present

## 2022-04-10 DIAGNOSIS — R4182 Altered mental status, unspecified: Secondary | ICD-10-CM | POA: Diagnosis not present

## 2022-04-10 DIAGNOSIS — E162 Hypoglycemia, unspecified: Secondary | ICD-10-CM

## 2022-04-10 DIAGNOSIS — J45909 Unspecified asthma, uncomplicated: Secondary | ICD-10-CM | POA: Insufficient documentation

## 2022-04-10 DIAGNOSIS — E11649 Type 2 diabetes mellitus with hypoglycemia without coma: Secondary | ICD-10-CM | POA: Insufficient documentation

## 2022-04-10 DIAGNOSIS — R000 Tachycardia, unspecified: Secondary | ICD-10-CM | POA: Insufficient documentation

## 2022-04-10 LAB — COMPREHENSIVE METABOLIC PANEL
ALT: 16 U/L (ref 0–44)
AST: 18 U/L (ref 15–41)
Albumin: 2.7 g/dL — ABNORMAL LOW (ref 3.5–5.0)
Alkaline Phosphatase: 75 U/L (ref 38–126)
Anion gap: 6 (ref 5–15)
BUN: 21 mg/dL — ABNORMAL HIGH (ref 6–20)
CO2: 33 mmol/L — ABNORMAL HIGH (ref 22–32)
Calcium: 8.4 mg/dL — ABNORMAL LOW (ref 8.9–10.3)
Chloride: 98 mmol/L (ref 98–111)
Creatinine, Ser: 0.49 mg/dL (ref 0.44–1.00)
GFR, Estimated: >60 mL/min (ref >60)
Glucose, Bld: 122 mg/dL — ABNORMAL HIGH (ref 70–99)
Potassium: 3.1 mmol/L — ABNORMAL LOW (ref 3.5–5.1)
Sodium: 137 mmol/L (ref 135–145)
Total Bilirubin: 1.8 mg/dL — ABNORMAL HIGH (ref 0.3–1.2)
Total Protein: 5.8 g/dL — ABNORMAL LOW (ref 6.5–8.1)

## 2022-04-10 LAB — CBC
HCT: 33.9 % — ABNORMAL LOW (ref 36.0–46.0)
Hemoglobin: 11 g/dL — ABNORMAL LOW (ref 12.0–15.0)
MCH: 28.6 pg (ref 26.0–34.0)
MCHC: 32.4 g/dL (ref 30.0–36.0)
MCV: 88.1 fL (ref 80.0–100.0)
Platelets: 187 10*3/uL (ref 150–400)
RBC: 3.85 MIL/uL — ABNORMAL LOW (ref 3.87–5.11)
RDW: 19.1 % — ABNORMAL HIGH (ref 11.5–15.5)
WBC: 6 10*3/uL (ref 4.0–10.5)
nRBC: 0 % (ref 0.0–0.2)

## 2022-04-10 LAB — CBG MONITORING, ED
Glucose-Capillary: 117 mg/dL — ABNORMAL HIGH (ref 70–99)
Glucose-Capillary: 96 mg/dL (ref 70–99)
Glucose-Capillary: 115 mg/dL — ABNORMAL HIGH (ref 70–99)

## 2022-04-10 MED ORDER — SODIUM CHLORIDE 0.9 % IV BOLUS
1000.0000 mL | Freq: Once | INTRAVENOUS | Status: AC
Start: 2022-04-10 — End: 2022-04-10
  Administered 2022-04-10: 1000 mL via INTRAVENOUS

## 2022-04-10 NOTE — ED Provider Notes (Signed)
?[facility]-EMERGENCY DEPT ?Provider Note ? ? ?CSN: [id number] ?Arrival date & time: 04/10/22  1350 ? ?  ? ?History ? ?Chief Complaint  ?Patient presents with  ? Hypoglycemia  ? Altered Mental Status  ? ? ?Tiffany [name] is a 61 y.o. female. ? ? ?Hypoglycemia ?Associated symptoms: altered mental status   ?Altered Mental Status ?Patient brought in for mental status change.  Reportedly found to be can fused with decreased mental status.  Found to have a CBG of 39.  Given oral glucagon.  Then improvement of mental status.  Per EMS report reportedly there as a diabetic and recent stroke.  Reviewing records it does not appear as if she is diabetic and that she has not had a recent stroke. ?  ?Past Medical History:  ?Diagnosis Date  ? Asthma   ? Bipolar 1 disorder (HCC)   ? COPD (chronic obstructive pulmonary disease) (HCC)   ? CVA (cerebral vascular accident) [facility])   ? Enlarged heart   ? GERD (gastroesophageal reflux disease)   ? Migraines   ? Sleep apnea   ? ? ?Home Medications ?Prior to Admission medications   ?Medication Sig Start Date End Date Taking? Authorizing Provider  ?acetaminophen (TYLENOL) 325 MG tablet Take 2 tablets (650 mg total) by mouth every 6 (six) hours as needed for mild pain (or Fever >/= 101). 04/03/22   Alford Highland, MD  ?diclofenac Sodium (VOLTAREN) 1 % GEL Apply 4 g topically 4 (four) times daily. To areas of pain 04/03/22   Alford Highland, MD  ?diphenhydrAMINE (BENADRYL) 12.5 MG/5ML elixir Take 10 mLs (25 mg total) by mouth 4 (four) times daily as needed. 02/18/22   Georga Hacking, MD  ?ferrous sulfate 300 (60 Fe) MG/5ML syrup Take 5 mLs (300 mg total) by mouth daily. 04/03/22 05/03/22  Alford Highland, MD  ?Haywood Pao HFA 110 MCG/ACT inhaler Inhale 2 puffs into the lungs 2 (two) times daily. 10/20/21   [provider]  ?Magnesium Oxide 400 MG CAPS Take 1 capsule (400 mg total) by mouth daily. 04/03/22   Alford Highland, MD  ?Multiple Vitamin (MULTIVITAMIN WITH  MINERALS) TABS tablet Take 1 tablet by mouth daily. 11/09/21   Lynn Ito, MD  ?Nutritional Supplements (FEEDING SUPPLEMENT, KATE FARMS STANDARD 1.4,) LIQD liquid Take 325 mLs by mouth 3 (three) times daily between meals. 11/08/21   Lynn Ito, MD  ?ondansetron (ZOFRAN-ODT) 4 MG disintegrating tablet Take 1 tablet (4 mg total) by mouth every 8 (eight) hours as needed for nausea or vomiting. 02/17/22   Darlin Priestly, MD  ?pantoprazole (PROTONIX) 40 MG tablet Take 1 tablet (40 mg total) by mouth daily. 04/04/22   Alford Highland, MD  ?polyethylene glycol (MIRALAX / GLYCOLAX) 17 g packet Take 17 g by mouth 2 (two) times daily as needed. 02/17/22   Darlin Priestly, MD  ?potassium chloride SA (KLOR-CON M) 20 MEQ tablet Take 1 tablet (20 mEq total) by mouth daily. 04/04/22   Alford Highland, MD  ?predniSONE (DELTASONE) 10 MG tablet 3 tabs po day1; 2 tabs po day2,3; 1 tab po day4,5; 1/2 tab po day6,7 04/03/22   Alford Highland, MD  ?   ? ?Allergies    ?Dairy aid [tilactase], [employer] or egg-derived products], Metoclopramide, and Milk-related compounds   ? ?Review of Systems   ?Review of Systems ? ?Physical Exam ?Updated Vital Signs ?BP (!) 143/77   Pulse (!) 101   Temp 98.7 ?F (37.1 ?C) (Oral)   Resp 13   Ht 5'  9" (1.753 m)   Wt 100.7 kg   SpO2 97%   BMI 32.78 kg/m?  ?Physical Exam ?Vitals reviewed.  ?HENT:  ?   Mouth/Throat:  ?   Mouth: Mucous membranes are moist.  ?Cardiovascular:  ?   Rate and Rhythm: Regular rhythm. Tachycardia present.  ?Pulmonary:  ?   Breath sounds: No wheezing or rhonchi.  ?Abdominal:  ?   Tenderness: There is no abdominal tenderness.  ?Musculoskeletal:     ?   General: No tenderness.  ?Skin: ?   General: Skin is warm.  ?   Capillary Refill: Capillary refill takes less than 2 seconds.  ?Neurological:  ?   Mental Status: She is alert.  ?   Comments: We will move her extremities.  Will not speak to me but will nod yes or no.  ? ? ?ED Results / Procedures / Treatments   ?Labs ?(all labs ordered are  listed, but only abnormal results are displayed) ?Labs Reviewed  ?COMPREHENSIVE METABOLIC PANEL - Abnormal; Notable for the following components:  ?    Result Value  ? Potassium 3.1 (*)   ? CO2 33 (*)   ? Glucose, Bld 122 (*)   ? BUN 21 (*)   ? Calcium 8.4 (*)   ? Total Protein 5.8 (*)   ? Albumin 2.7 (*)   ? Total Bilirubin 1.8 (*)   ? All other components within normal limits  ?CBC - Abnormal; Notable for the following components:  ? RBC 3.85 (*)   ? Hemoglobin 11.0 (*)   ? HCT 33.9 (*)   ? RDW 19.1 (*)   ? All other components within normal limits  ?CBG MONITORING, ED - Abnormal; Notable for the following components:  ? Glucose-Capillary 117 (*)   ? All other components within normal limits  ?URINALYSIS, ROUTINE W REFLEX MICROSCOPIC  ?CBG MONITORING, ED  ?CBG MONITORING, ED  ? ? ?EKG ?None ? ?Radiology ?No results found. ? ?Procedures ?Procedures  ? ? ?Medications Ordered in ED ?Medications - No data to display ? ?ED Course/ Medical Decision Making/ A&P ?  ?                        ?Medical Decision Making ?Amount and/or Complexity of Data Reviewed ?Labs: ordered. ? ? ?Patient brought in for mental status change.  Reviewing records does not appear to be clearly diabetic.  Not on any diabetic treatment.  CBG of 39 at the nursing home.  Has been more elevated here.  Mild hypokalemia.  BUN elevated may have a component of dehydration.  Does have history of bipolar disorder.  Has had recent MRI that showed only empty sella.  We will continue to monitor sugar and will get urinalysis.  Care turned over to Dr. Silverio Lay. ?Differential diagnosis for hypoglycemia and confusion is large.  I have personally reviewed previous discharge notes from other hospital stays ? ? ? ? ? ? ? ?Final Clinical Impression(s) / ED Diagnoses ?Final diagnoses:  ?Hypoglycemia  ?Altered mental status, unspecified altered mental status type  ? ? ?Rx / DC Orders ?ED Discharge Orders   ? ? None  ? ?  ? ? ?  ?Benjiman Core, MD ?04/10/22 1614 ? ?

## 2022-04-10 NOTE — Discharge Instructions (Addendum)
Your glucose is normal while you are in the ED. ? ?Please stay hydrated ? ?See your doctor for follow-up.  If you are unable to eat you may want to talk to a GI doctor for G-tube ? ?Return to ER if you have persistent hypoglycemia, vomiting, lethargy ?

## 2022-04-10 NOTE — ED Provider Notes (Signed)
?  Physical Exam  ?BP (!) 136/97   Pulse 100   Temp 98.7 ?F (37.1 ?C) (Oral)   Resp 13   Ht 5\' 9"  (1.753 m)   Wt 100.7 kg   SpO2 98%   BMI 32.78 kg/m?  ? ?Physical Exam ? ?Procedures  ?Procedures ? ?ED Course / MDM  ?  ?Medical Decision Making ?Care assumed at 4 PM from Dr. .  Patient is here with hypoglycemia.  Patient was recently admitted for angioedema and still has some cheek and lip swelling.  Consequently, patient did not have good appetite and has not been eating that much.  Her sugar prior to arrival was 33.  She was given glucagon prior to arrival.  Glucose was 117 on arrival.  Signout pending urinalysis and reassessment and repeat CBG ? ?6:07 PM ?Family is not at bedside.  Mental status is baseline.  CT head unremarkable.  Repeat CBG is 115.  Multiple staff members try to get urinalysis but unable to obtain urine even with In-N-Out cath.  Patient has no urinary symptoms and is afebrile.  She is observed for 4 hours in the ED and has remained stable.  At this point I think she is stable for discharge back to facility.  If she has persistent trouble eating, she may qualify for a G-tube. ? ?Problems Addressed: ?Altered mental status, unspecified altered mental status type: acute illness or injury ?Hypoglycemia: acute illness or injury ? ?Amount and/or Complexity of Data Reviewed ?Labs: ordered. Decision-making details documented in ED Course. ?Radiology: ordered and independent interpretation performed. Decision-making details documented in ED Course. ? ? ? ? ? ? ? ?  ?Rubin Payor, MD ?04/10/22 1808 ? ?

## 2022-04-10 NOTE — ED Triage Notes (Signed)
Coming from [facility] with c/o hypoglycemia and AMS since this morning nursing staff came in at facility.  ?Pt has hx of diabetes and recent stroke. Pt has right sided deficit. Pt has been at [facility] for 5 days for stroke rehab, has not had any blood sugar checks since being there per facility. Unsure what pt's orientation/activity is at baseline. ? ?Initial EMS CBG was 39. ?EMS gave 1mg  glucagon oral then CBG was 111.  ? ?168-98 ?spO2 98 RA ?HR 100 ?

## 2022-04-10 NOTE — ED Notes (Signed)
[facility] called  

## 2022-04-30 DEATH — deceased

## 2022-09-01 IMAGING — CT CT ABD-PELV W/ CM
2 of 5 series · 17 of 46 positions shown, 19 images · IV contrast (APPLIED)
Comparison: 07/23/2021

CLINICAL DATA: Abdominal pain

EXAM:
CT ABDOMEN AND PELVIS WITH CONTRAST
TECHNIQUE: Multidetector CT imaging of the abdomen and pelvis was performed
using the standard protocol following bolus administration of
intravenous contrast.
CONTRAST:  100mL OMNIPAQUE IOHEXOL 350 MG/ML SOLN

[Series 2: routine abd/pel with · axial · 0.98mm/px · z∈[-1047,-587]mm · 14 of 104 slices shown, 16 images]
[im 6/104  soft-tissue]
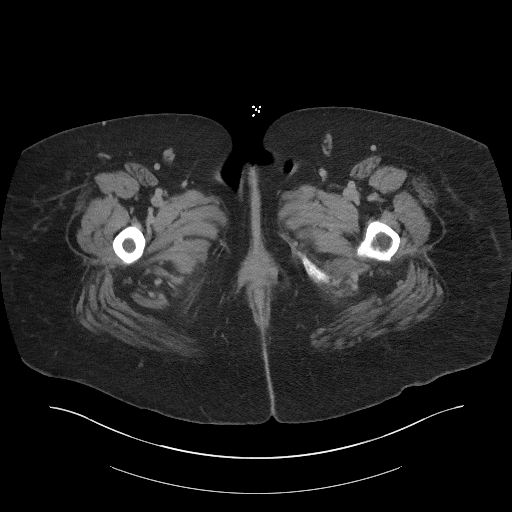
[im 6/104  bone]
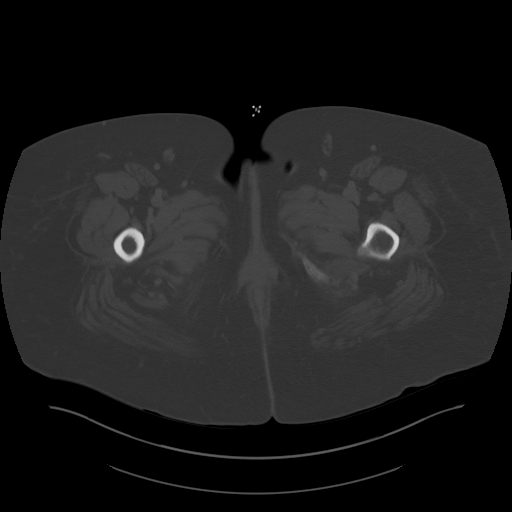
[im 12/104  soft-tissue]
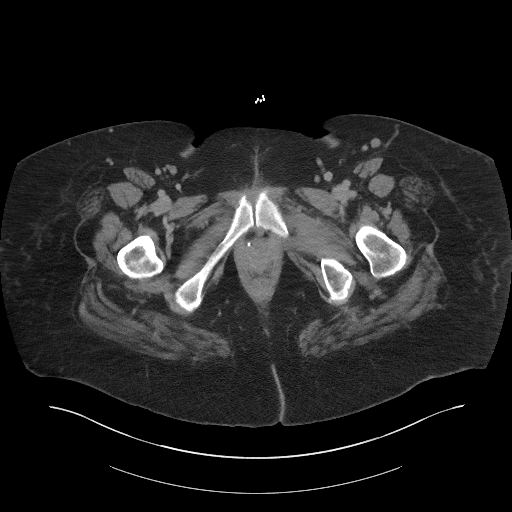
[im 23/104  soft-tissue]
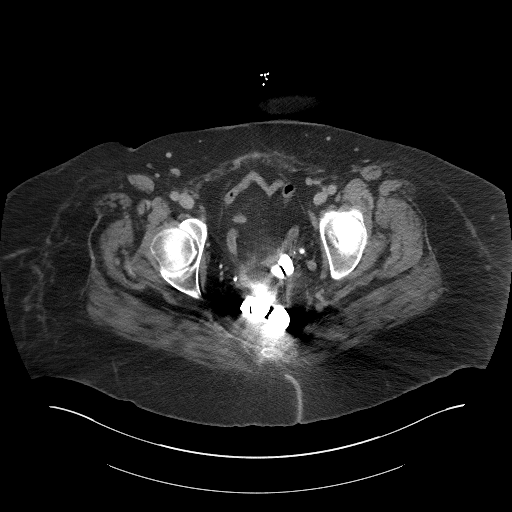
[im 29/104  soft-tissue]
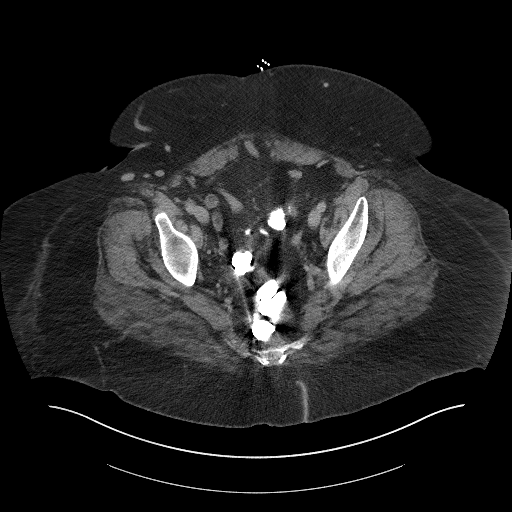
[im 35/104  soft-tissue]
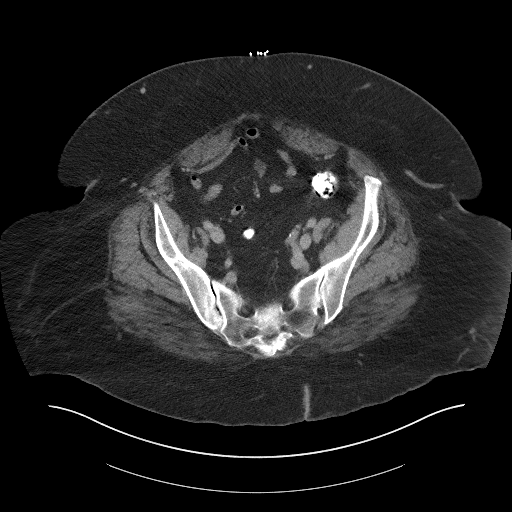
[im 41/104  soft-tissue]
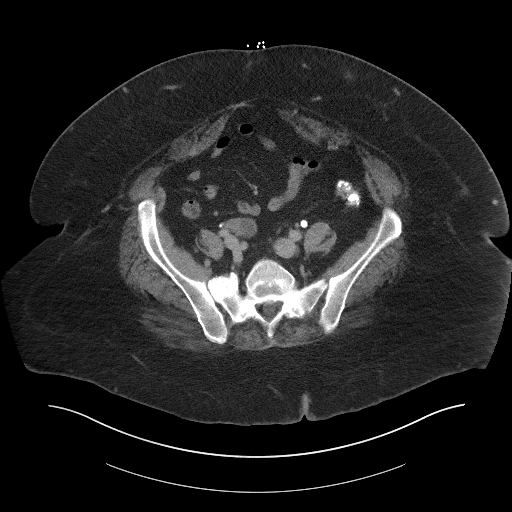
[im 46/104  soft-tissue]
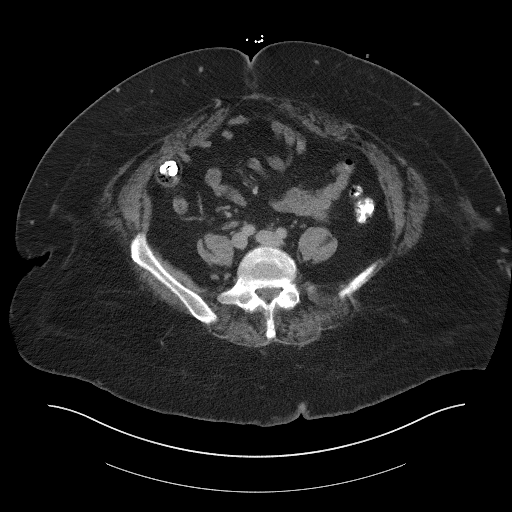
[im 58/104  soft-tissue]
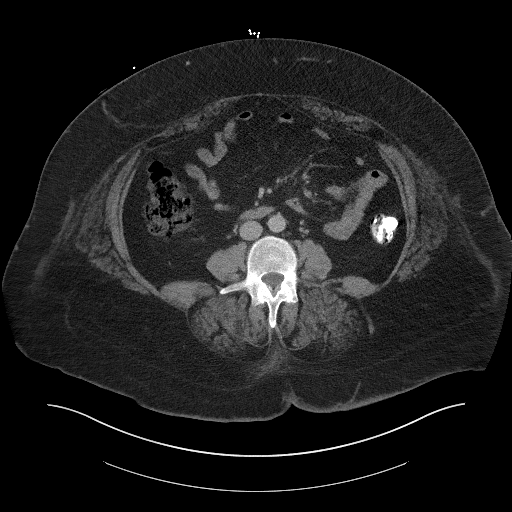
[im 63/104  soft-tissue]
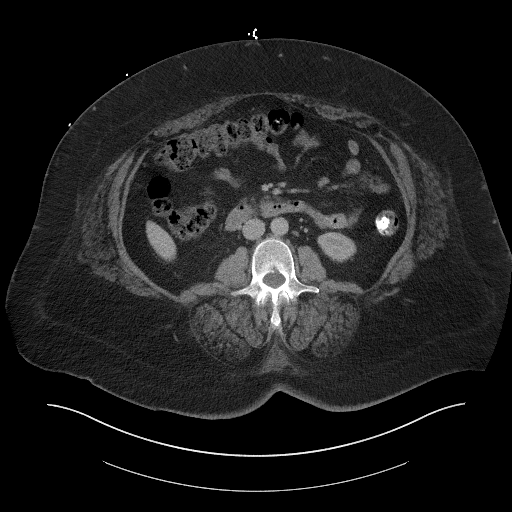
[im 63/104  bone]
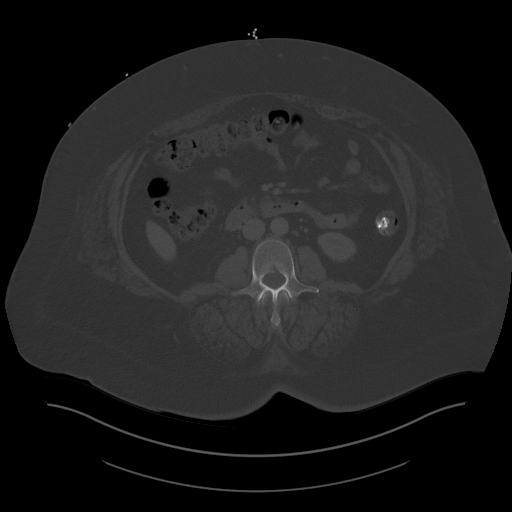
[im 69/104  soft-tissue]
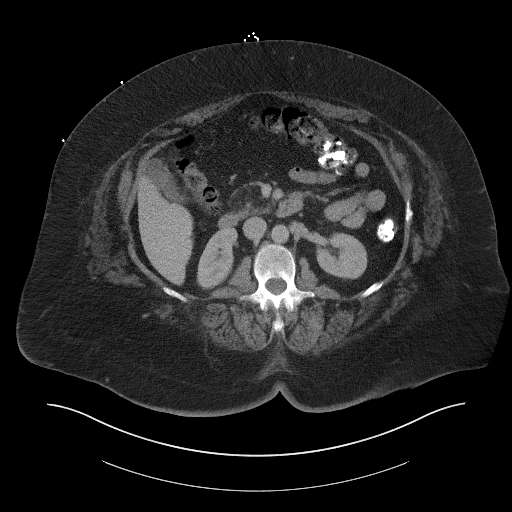
[im 75/104  soft-tissue]
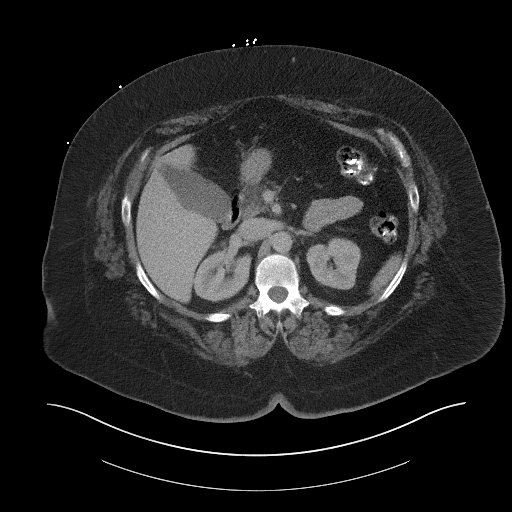
[im 81/104  soft-tissue]
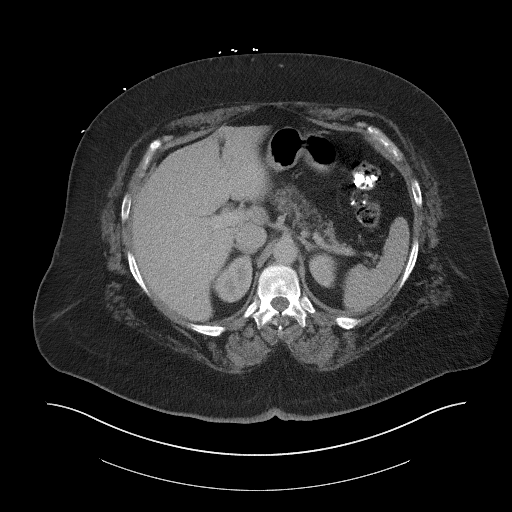
[im 92/104  soft-tissue]
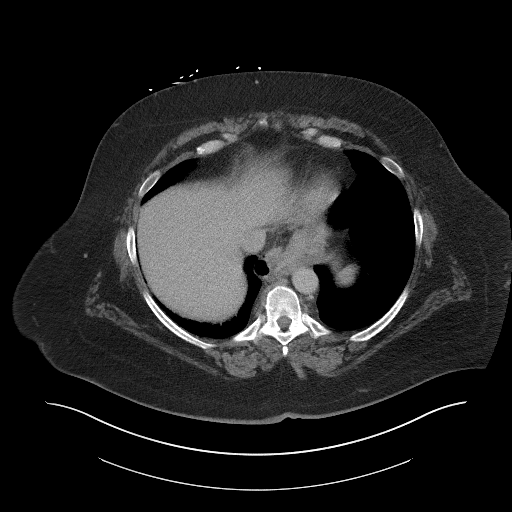
[im 98/104  soft-tissue]
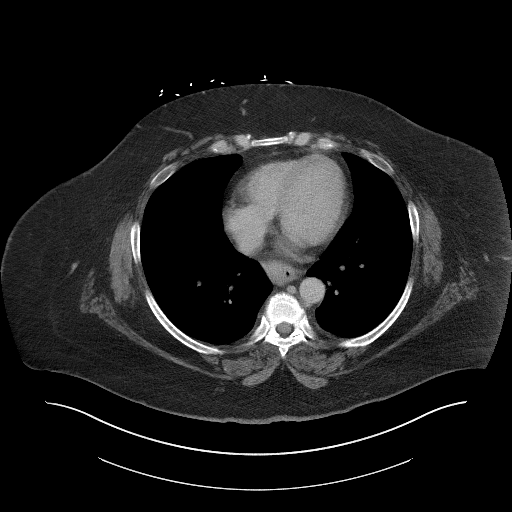

[Series 5: coronal st · coronal · 1.03mm/px · 3 of 129 slices shown]
[im 43/129  soft-tissue]
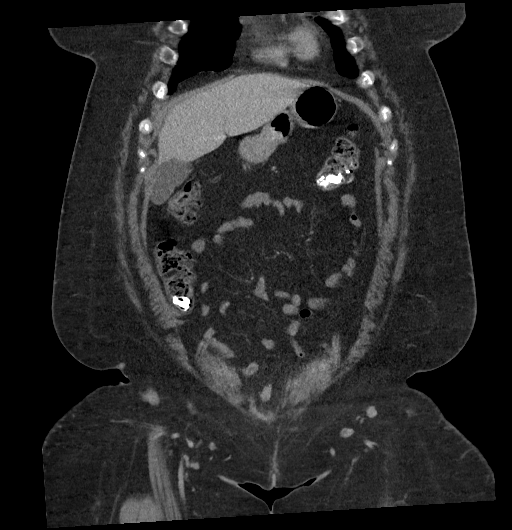
[im 57/129  soft-tissue]
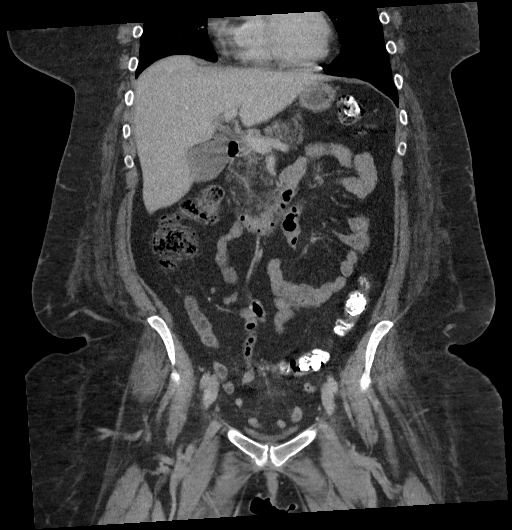
[im 72/129  soft-tissue]
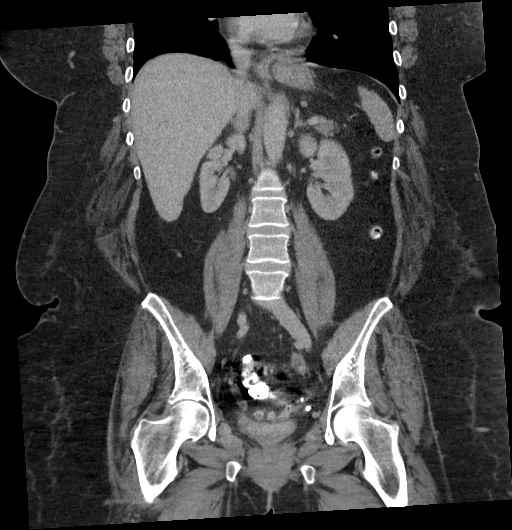

[17 of 46 positions shown; findings below may reference images not displayed]

FINDINGS: Lower chest: Lung bases are clear.

Hepatobiliary: Liver is within normal limits.

Layering gallbladder sludge (series 2/image 32), without associated
inflammatory changes. No intrahepatic or extrahepatic ductal
dilatation.

Pancreas: Within normal limits.

Spleen: Within normal limits.

Adrenals/Urinary Tract: Adrenal glands are within normal limits.

Kidneys are within normal limits.  No hydronephrosis.

Bladder is underdistended but unremarkable.

Stomach/Bowel: Stomach is notable for a moderate hiatal hernia.

No evidence of bowel obstruction.

Prior appendectomy.

Dense contrast in the rectosigmoid region.

Vascular/Lymphatic: No evidence of abdominal aortic aneurysm.

No suspicious abdominopelvic lymphadenopathy.

Reproductive: Status post hysterectomy.

Left ovary is within normal limits.  No right adnexal mass.

Other: No abdominopelvic ascites.

Musculoskeletal: Visualized osseous structures are within normal
limits.
IMPRESSION: No evidence of bowel obstruction.  Prior appendectomy.

Layering gallbladder sludge, without associated inflammatory
changes.

Moderate hiatal hernia.

## 2023-02-27 IMAGING — CT CT ABD-PELV W/ CM
2 of 5 series · 15 of 46 positions shown, 17 images · IV contrast (APPLIED)
Comparison: Abdominopelvic CT 08/12/2021.

CLINICAL DATA: Nausea and vomiting for 2 days.

EXAM:
CT ABDOMEN AND PELVIS WITH CONTRAST
TECHNIQUE: Multidetector CT imaging of the abdomen and pelvis was performed
using the standard protocol following bolus administration of
intravenous contrast.

[Series 2: routine abd/pel with · axial · 0.84mm/px · z∈[-1039,-629]mm · 12 of 92 slices shown, 14 images]
[im 5/92  soft-tissue]
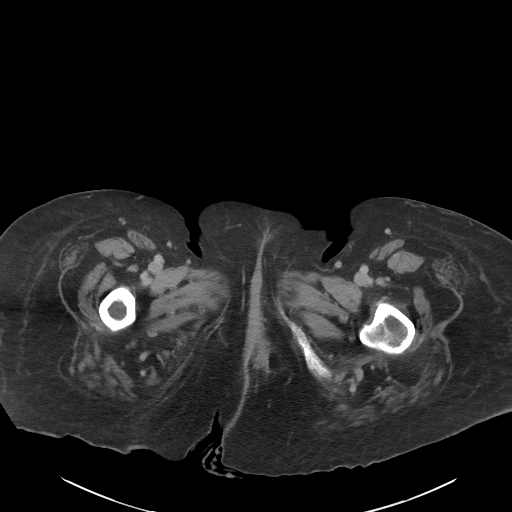
[im 5/92  bone]
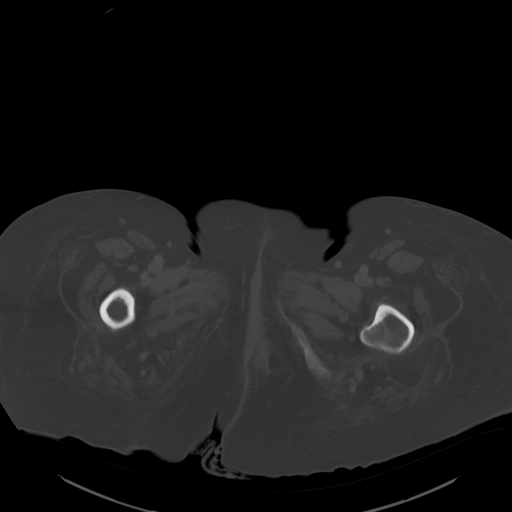
[im 15/92  soft-tissue]
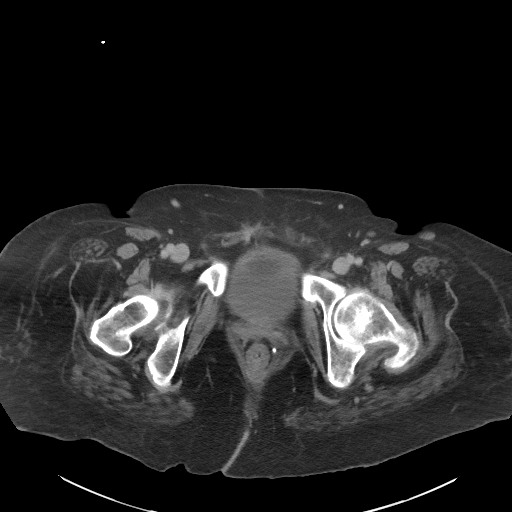
[im 20/92  soft-tissue]
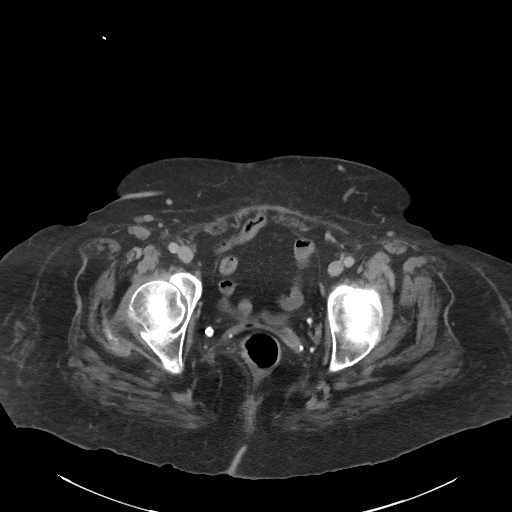
[im 29/92  soft-tissue]
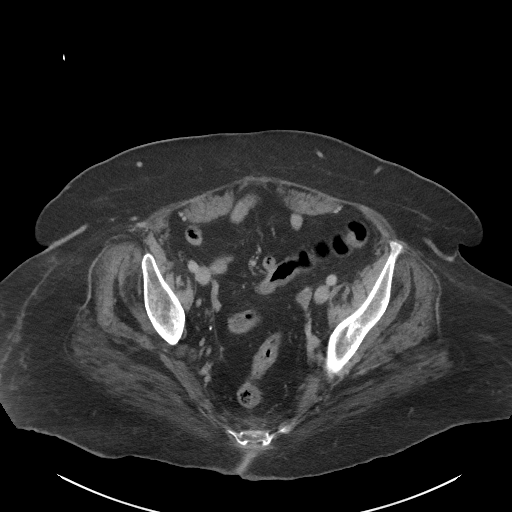
[im 34/92  soft-tissue]
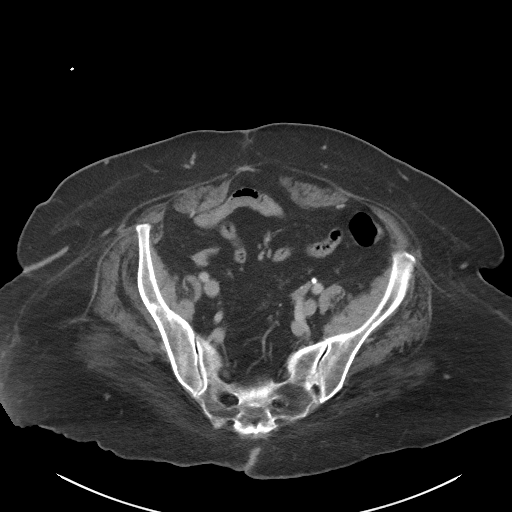
[im 44/92  soft-tissue]
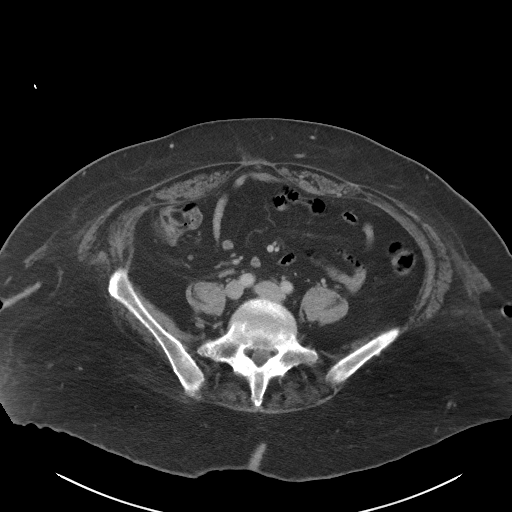
[im 48/92  soft-tissue]
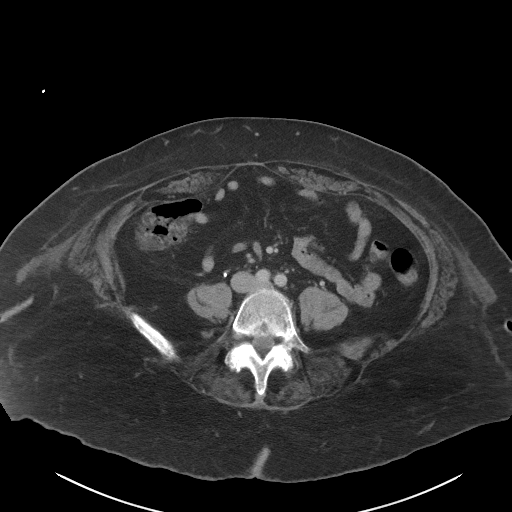
[im 58/92  soft-tissue]
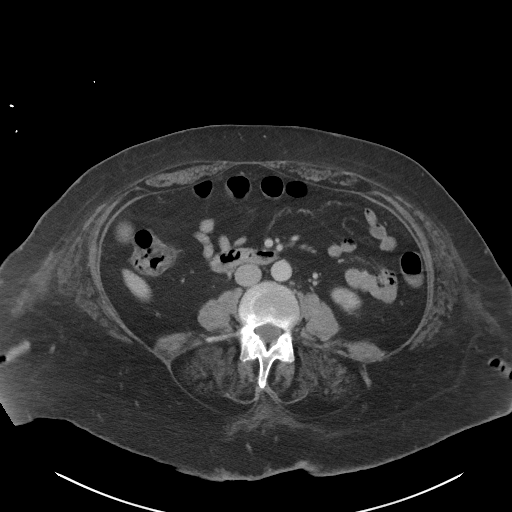
[im 63/92  soft-tissue]
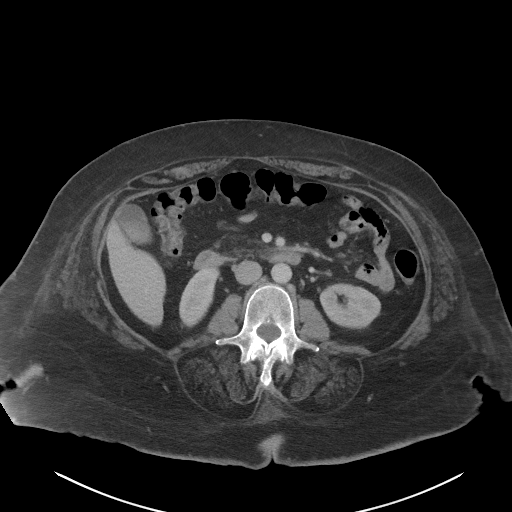
[im 63/92  bone]
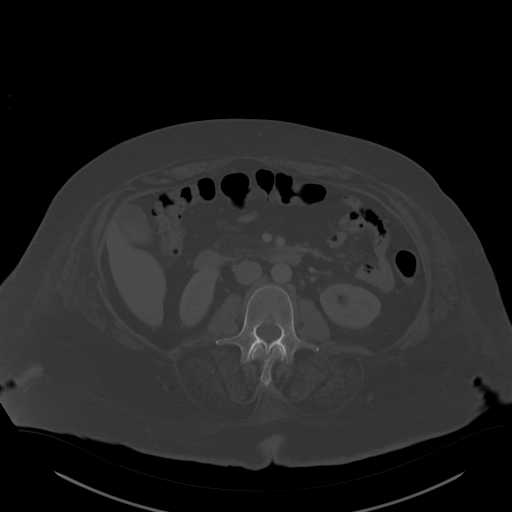
[im 72/92  soft-tissue]
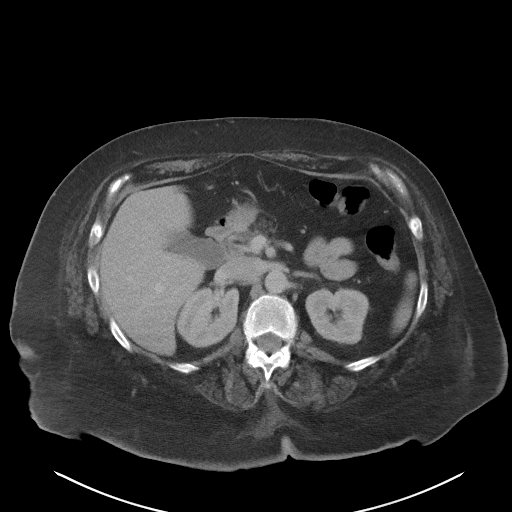
[im 77/92  soft-tissue]
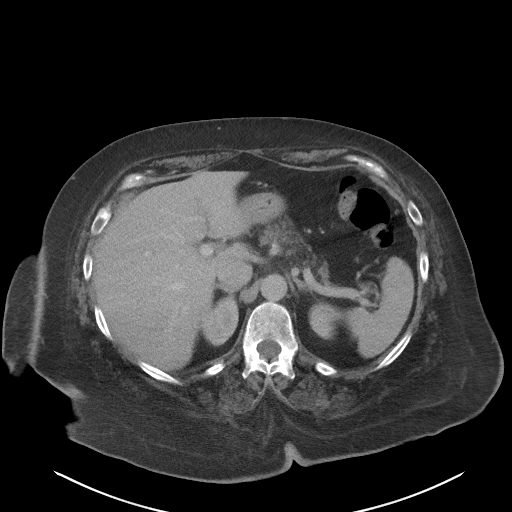
[im 87/92  soft-tissue]
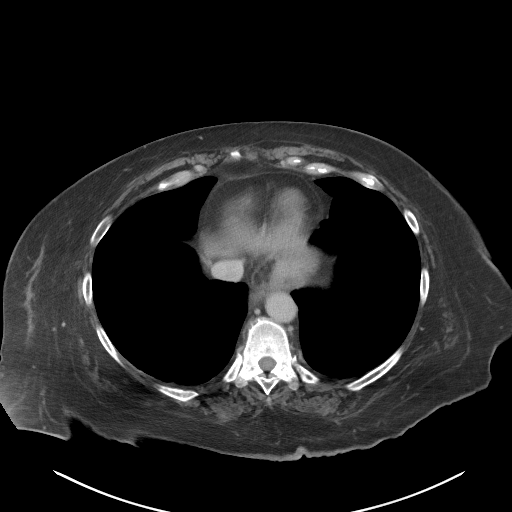

[Series 5: coronal st · coronal · 0.82mm/px · 3 of 104 slices shown]
[im 35/104  soft-tissue]
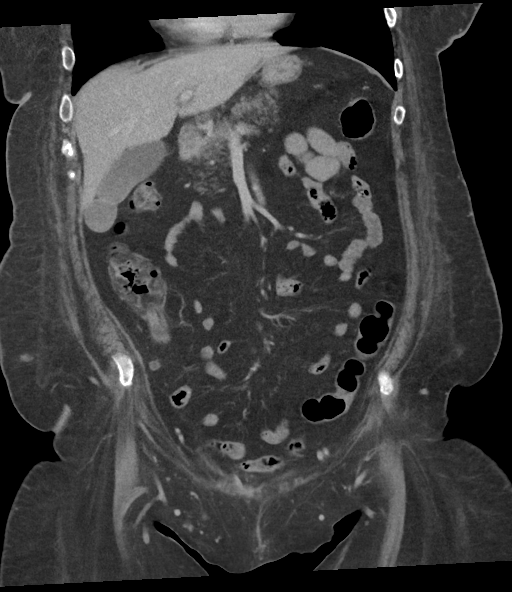
[im 46/104  soft-tissue]
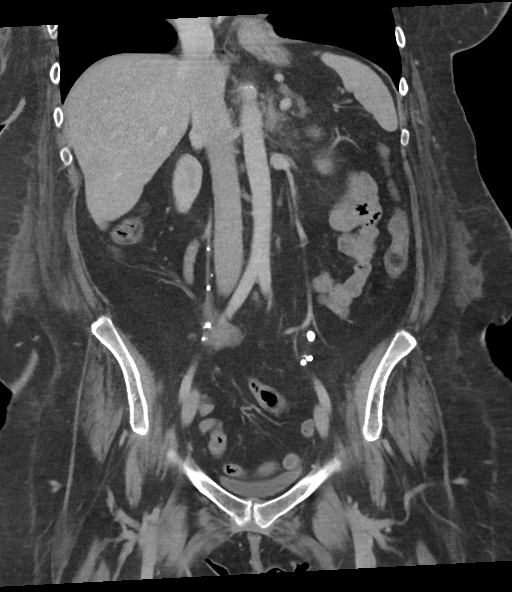
[im 58/104  soft-tissue]
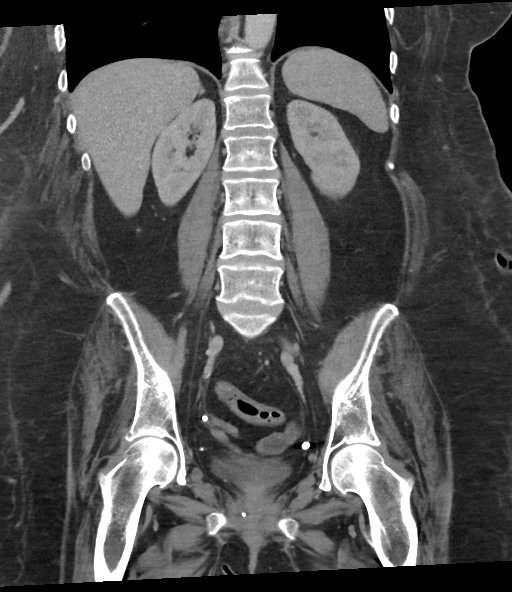

[15 of 46 positions shown; findings below may reference images not displayed]

RADIATION DOSE REDUCTION: This exam was performed according to the
departmental dose-optimization program which includes automated
exposure control, adjustment of the mA and/or kV according to
patient size and/or use of iterative reconstruction technique.

CONTRAST:  100mL OMNIPAQUE IOHEXOL 300 MG/ML  SOLN
FINDINGS: Lower chest: Clear lung bases. No significant pleural or pericardial
effusion. Moderate size hiatal hernia appears similar to the
previous study, with possible associated wall thickening.

Hepatobiliary: The liver is normal in density without suspicious
focal abnormality. Probable dependent sludge in the gallbladder
lumen. No evidence of gallstones, gallbladder wall thickening or
surrounding inflammation. No evidence of biliary ductal dilatation.

Pancreas: Fatty replaced. No evidence of ductal dilatation or
surrounding inflammation.

Spleen: Normal in size without focal abnormality.

Adrenals/Urinary Tract: Both adrenal glands appear normal. The
kidneys appear normal without evidence of urinary tract calculus,
suspicious lesion or hydronephrosis. No bladder abnormalities are
seen.

Stomach/Bowel: No enteric contrast administered. As above,
moderate-size hiatal hernia. The stomach otherwise appears
unremarkable for its degree of distension. No evidence of bowel wall
thickening, distention or surrounding inflammatory change. Prior
study suggested previous appendectomy. Currently, there is a mildly
prominent tubular structure projecting inferiorly from the cecal tip
which appears blind-ending and likely reflects the appendix. This
measures up to 8 mm in diameter, but demonstrates no surrounding
inflammation.

Vascular/Lymphatic: There are no enlarged abdominal or pelvic lymph
nodes. No acute vascular findings. Scattered retroperitoneal
phleboliths.

Reproductive: Hysterectomy. Stable appearance of the left ovary. No
suspicious adnexal findings.

Other: No evidence of abdominal wall mass or hernia. No ascites.

Musculoskeletal: No acute or significant osseous findings.
Generalized muscular atrophy.
IMPRESSION: 1. No definite acute findings. Apparent prominence of the appendix
without surrounding inflammatory changes to suggest acute
appendicitis in this patient without leukocytosis. Recommend
clinical follow up.
2. Moderate size hiatal hernia.  No evidence of bowel obstruction.
3. Previous hysterectomy.
